# Patient Record
Sex: Female | Born: 1935 | Race: White | Hispanic: No | Smoking: Never smoker
Health system: Southern US, Community
[De-identification: ages and names within clinical notes are randomized; demographics above are authoritative.]

## PROBLEM LIST (undated history)

## (undated) DIAGNOSIS — E785 Hyperlipidemia, unspecified: Secondary | ICD-10-CM

## (undated) DIAGNOSIS — M545 Low back pain, unspecified: Secondary | ICD-10-CM

## (undated) DIAGNOSIS — K5790 Diverticulosis of intestine, part unspecified, without perforation or abscess without bleeding: Secondary | ICD-10-CM

## (undated) DIAGNOSIS — K859 Acute pancreatitis without necrosis or infection, unspecified: Secondary | ICD-10-CM

## (undated) DIAGNOSIS — D649 Anemia, unspecified: Secondary | ICD-10-CM

## (undated) DIAGNOSIS — F411 Generalized anxiety disorder: Secondary | ICD-10-CM

## (undated) DIAGNOSIS — I10 Essential (primary) hypertension: Secondary | ICD-10-CM

## (undated) DIAGNOSIS — K449 Diaphragmatic hernia without obstruction or gangrene: Secondary | ICD-10-CM

## (undated) DIAGNOSIS — K649 Unspecified hemorrhoids: Secondary | ICD-10-CM

## (undated) DIAGNOSIS — M1990 Unspecified osteoarthritis, unspecified site: Secondary | ICD-10-CM

## (undated) DIAGNOSIS — K2970 Gastritis, unspecified, without bleeding: Secondary | ICD-10-CM

## (undated) DIAGNOSIS — I350 Nonrheumatic aortic (valve) stenosis: Secondary | ICD-10-CM

## (undated) DIAGNOSIS — E039 Hypothyroidism, unspecified: Secondary | ICD-10-CM

## (undated) DIAGNOSIS — I739 Peripheral vascular disease, unspecified: Secondary | ICD-10-CM

## (undated) HISTORY — DX: Unspecified osteoarthritis, unspecified site: M19.90

## (undated) HISTORY — PX: HEMORRHOID SURGERY: SHX153

## (undated) HISTORY — PX: TUBAL LIGATION: SHX77

## (undated) HISTORY — DX: Low back pain, unspecified: M54.50

## (undated) HISTORY — DX: Diverticulosis of intestine, part unspecified, without perforation or abscess without bleeding: K57.90

## (undated) HISTORY — DX: Hypothyroidism, unspecified: E03.9

## (undated) HISTORY — DX: Nonrheumatic aortic (valve) stenosis: I35.0

## (undated) HISTORY — DX: Hyperlipidemia, unspecified: E78.5

## (undated) HISTORY — PX: APPENDECTOMY: SHX54

## (undated) HISTORY — DX: Anemia, unspecified: D64.9

## (undated) HISTORY — DX: Generalized anxiety disorder: F41.1

## (undated) HISTORY — DX: Gastritis, unspecified, without bleeding: K29.70

## (undated) HISTORY — PX: BACK SURGERY: SHX140

## (undated) HISTORY — PX: BREAST SURGERY: SHX581

## (undated) HISTORY — DX: Essential (primary) hypertension: I10

## (undated) HISTORY — DX: Unspecified hemorrhoids: K64.9

## (undated) HISTORY — DX: Acute pancreatitis without necrosis or infection, unspecified: K85.90

## (undated) HISTORY — DX: Diaphragmatic hernia without obstruction or gangrene: K44.9

## (undated) HISTORY — DX: Low back pain: M54.5

---

## 1993-03-31 HISTORY — PX: ANGIOPLASTY: SHX39

## 1999-06-05 ENCOUNTER — Encounter: Payer: Self-pay | Admitting: *Deleted

## 1999-06-05 ENCOUNTER — Ambulatory Visit (HOSPITAL_COMMUNITY): Admission: RE | Admit: 1999-06-05 | Discharge: 1999-06-05 | Payer: Self-pay | Admitting: *Deleted

## 1999-06-21 ENCOUNTER — Ambulatory Visit (HOSPITAL_COMMUNITY): Admission: RE | Admit: 1999-06-21 | Discharge: 1999-06-21 | Payer: Self-pay | Admitting: *Deleted

## 1999-06-21 ENCOUNTER — Encounter: Payer: Self-pay | Admitting: *Deleted

## 1999-07-05 ENCOUNTER — Ambulatory Visit (HOSPITAL_COMMUNITY): Admission: RE | Admit: 1999-07-05 | Discharge: 1999-07-05 | Payer: Self-pay | Admitting: *Deleted

## 1999-07-05 ENCOUNTER — Encounter: Payer: Self-pay | Admitting: *Deleted

## 1999-10-01 ENCOUNTER — Emergency Department (HOSPITAL_COMMUNITY): Admission: EM | Admit: 1999-10-01 | Discharge: 1999-10-01 | Payer: Self-pay | Admitting: Emergency Medicine

## 1999-10-28 ENCOUNTER — Encounter: Payer: Self-pay | Admitting: Neurosurgery

## 1999-10-28 ENCOUNTER — Ambulatory Visit (HOSPITAL_COMMUNITY): Admission: RE | Admit: 1999-10-28 | Discharge: 1999-10-28 | Payer: Self-pay | Admitting: Neurosurgery

## 2000-01-31 ENCOUNTER — Encounter: Payer: Self-pay | Admitting: Family Medicine

## 2000-01-31 ENCOUNTER — Encounter: Admission: RE | Admit: 2000-01-31 | Discharge: 2000-01-31 | Payer: Self-pay | Admitting: Family Medicine

## 2000-11-05 ENCOUNTER — Other Ambulatory Visit: Admission: RE | Admit: 2000-11-05 | Discharge: 2000-11-05 | Payer: Self-pay | Admitting: Family Medicine

## 2000-11-10 ENCOUNTER — Ambulatory Visit (HOSPITAL_COMMUNITY): Admission: RE | Admit: 2000-11-10 | Discharge: 2000-11-10 | Payer: Self-pay | Admitting: Family Medicine

## 2002-12-21 ENCOUNTER — Other Ambulatory Visit: Admission: RE | Admit: 2002-12-21 | Discharge: 2002-12-21 | Payer: Self-pay | Admitting: Family Medicine

## 2003-02-27 ENCOUNTER — Encounter: Admission: RE | Admit: 2003-02-27 | Discharge: 2003-02-27 | Payer: Self-pay | Admitting: Family Medicine

## 2003-04-01 LAB — CONVERTED CEMR LAB

## 2003-04-01 LAB — HM MAMMOGRAPHY

## 2004-01-01 ENCOUNTER — Encounter: Admission: RE | Admit: 2004-01-01 | Discharge: 2004-01-01 | Payer: Self-pay | Admitting: Family Medicine

## 2004-05-02 ENCOUNTER — Other Ambulatory Visit: Admission: RE | Admit: 2004-05-02 | Discharge: 2004-05-02 | Payer: Self-pay | Admitting: Family Medicine

## 2004-07-29 ENCOUNTER — Encounter: Admission: RE | Admit: 2004-07-29 | Discharge: 2004-07-29 | Payer: Self-pay | Admitting: Family Medicine

## 2005-09-23 ENCOUNTER — Encounter: Admission: RE | Admit: 2005-09-23 | Discharge: 2005-09-23 | Payer: Self-pay | Admitting: Family Medicine

## 2006-09-09 ENCOUNTER — Ambulatory Visit: Payer: Self-pay | Admitting: Internal Medicine

## 2006-09-29 ENCOUNTER — Ambulatory Visit: Payer: Self-pay | Admitting: Internal Medicine

## 2006-09-29 LAB — HM COLONOSCOPY: HM Colonoscopy: NORMAL

## 2007-08-14 DIAGNOSIS — I10 Essential (primary) hypertension: Secondary | ICD-10-CM | POA: Insufficient documentation

## 2007-08-14 DIAGNOSIS — M1990 Unspecified osteoarthritis, unspecified site: Secondary | ICD-10-CM | POA: Insufficient documentation

## 2007-08-14 DIAGNOSIS — M129 Arthropathy, unspecified: Secondary | ICD-10-CM

## 2007-08-14 DIAGNOSIS — E785 Hyperlipidemia, unspecified: Secondary | ICD-10-CM | POA: Insufficient documentation

## 2007-08-14 DIAGNOSIS — M5416 Radiculopathy, lumbar region: Secondary | ICD-10-CM | POA: Insufficient documentation

## 2007-08-14 DIAGNOSIS — F411 Generalized anxiety disorder: Secondary | ICD-10-CM | POA: Insufficient documentation

## 2007-08-14 DIAGNOSIS — E039 Hypothyroidism, unspecified: Secondary | ICD-10-CM | POA: Insufficient documentation

## 2007-12-08 ENCOUNTER — Telehealth: Payer: Self-pay | Admitting: Internal Medicine

## 2007-12-21 ENCOUNTER — Encounter: Admission: RE | Admit: 2007-12-21 | Discharge: 2007-12-21 | Payer: Self-pay | Admitting: Neurosurgery

## 2007-12-23 ENCOUNTER — Ambulatory Visit: Payer: Self-pay | Admitting: Cardiology

## 2007-12-23 ENCOUNTER — Emergency Department (HOSPITAL_COMMUNITY): Admission: EM | Admit: 2007-12-23 | Discharge: 2007-12-23 | Payer: Self-pay | Admitting: Emergency Medicine

## 2008-01-03 ENCOUNTER — Ambulatory Visit: Payer: Self-pay

## 2008-01-11 ENCOUNTER — Ambulatory Visit: Payer: Self-pay | Admitting: Cardiovascular Disease

## 2008-05-08 ENCOUNTER — Telehealth: Payer: Self-pay | Admitting: Internal Medicine

## 2008-06-13 ENCOUNTER — Ambulatory Visit: Payer: Self-pay | Admitting: Internal Medicine

## 2008-06-13 LAB — CONVERTED CEMR LAB
ALT: 17 units/L (ref 0–35)
AST: 19 units/L (ref 0–37)
Albumin: 4.2 g/dL (ref 3.5–5.2)
Alkaline Phosphatase: 63 units/L (ref 39–117)
Amylase: 76 units/L (ref 27–131)
Bilirubin, Direct: 0.1 mg/dL (ref 0.0–0.3)
Lipase: 20 units/L (ref 11.0–59.0)
Total Bilirubin: 1.1 mg/dL (ref 0.3–1.2)
Total Protein: 7 g/dL (ref 6.0–8.3)

## 2008-06-19 ENCOUNTER — Telehealth: Payer: Self-pay | Admitting: Internal Medicine

## 2008-06-22 ENCOUNTER — Ambulatory Visit (HOSPITAL_COMMUNITY): Admission: RE | Admit: 2008-06-22 | Discharge: 2008-06-22 | Payer: Self-pay | Admitting: Internal Medicine

## 2008-06-26 ENCOUNTER — Ambulatory Visit: Payer: Self-pay | Admitting: Internal Medicine

## 2008-06-26 ENCOUNTER — Encounter: Payer: Self-pay | Admitting: Internal Medicine

## 2008-06-27 ENCOUNTER — Encounter: Admission: RE | Admit: 2008-06-27 | Discharge: 2008-06-27 | Payer: Self-pay | Admitting: Orthopedic Surgery

## 2008-07-06 ENCOUNTER — Encounter: Payer: Self-pay | Admitting: Internal Medicine

## 2008-12-26 ENCOUNTER — Telehealth (INDEPENDENT_AMBULATORY_CARE_PROVIDER_SITE_OTHER): Payer: Self-pay | Admitting: *Deleted

## 2009-01-09 ENCOUNTER — Telehealth: Payer: Self-pay | Admitting: Internal Medicine

## 2009-03-31 LAB — HM DIABETES EYE EXAM: HM Diabetic Eye Exam: NORMAL

## 2009-06-11 ENCOUNTER — Encounter: Payer: Self-pay | Admitting: Internal Medicine

## 2010-02-18 ENCOUNTER — Ambulatory Visit: Payer: Self-pay | Admitting: Cardiovascular Disease

## 2010-02-18 ENCOUNTER — Encounter: Payer: Self-pay | Admitting: Cardiovascular Disease

## 2010-02-24 ENCOUNTER — Telehealth (INDEPENDENT_AMBULATORY_CARE_PROVIDER_SITE_OTHER): Payer: Self-pay | Admitting: *Deleted

## 2010-02-24 ENCOUNTER — Inpatient Hospital Stay (HOSPITAL_COMMUNITY): Admission: EM | Admit: 2010-02-24 | Discharge: 2010-02-26 | Payer: Self-pay | Admitting: Emergency Medicine

## 2010-02-24 ENCOUNTER — Ambulatory Visit: Payer: Self-pay | Admitting: Internal Medicine

## 2010-02-24 ENCOUNTER — Ambulatory Visit: Payer: Self-pay | Admitting: Cardiology

## 2010-02-24 ENCOUNTER — Encounter: Payer: Self-pay | Admitting: Internal Medicine

## 2010-02-24 LAB — CONVERTED CEMR LAB
HCT: 36.5 %
Hemoglobin: 12.3 g/dL
MCV: 98.4 fL
Platelets: 264 10*3/uL
RBC: 3.71 M/uL
RDW: 18.7 %
WBC: 14.8 10*3/uL

## 2010-02-25 ENCOUNTER — Encounter: Payer: Self-pay | Admitting: Internal Medicine

## 2010-02-25 ENCOUNTER — Encounter (INDEPENDENT_AMBULATORY_CARE_PROVIDER_SITE_OTHER): Payer: Self-pay | Admitting: Internal Medicine

## 2010-02-26 ENCOUNTER — Encounter: Payer: Self-pay | Admitting: Internal Medicine

## 2010-02-26 LAB — CONVERTED CEMR LAB
ALT: 21 units/L
AST: 26 units/L
Albumin: 3 g/dL
Alkaline Phosphatase: 49 units/L
BUN: 17 mg/dL
CO2: 25 meq/L
Calcium: 7.8 mg/dL
Chloride: 112 meq/L
Creatinine, Ser: 0.79 mg/dL
Glucose, Bld: 96 mg/dL
Potassium: 3.5 meq/L
Sodium: 140 meq/L
TSH: 3.509 microintl units/mL
Total Bilirubin: 0.5 mg/dL
Total Protein: 5.5 g/dL

## 2010-02-28 ENCOUNTER — Ambulatory Visit: Payer: Self-pay

## 2010-02-28 ENCOUNTER — Encounter: Payer: Self-pay | Admitting: Cardiovascular Disease

## 2010-03-02 ENCOUNTER — Telehealth: Payer: Self-pay | Admitting: Internal Medicine

## 2010-03-20 ENCOUNTER — Ambulatory Visit: Payer: Self-pay | Admitting: Cardiovascular Disease

## 2010-03-29 ENCOUNTER — Encounter: Payer: Self-pay | Admitting: Internal Medicine

## 2010-03-29 LAB — CONVERTED CEMR LAB
ALT: 19 units/L
AST: 20 units/L
Albumin: 4.3 g/dL
Alkaline Phosphatase: 100 units/L
BUN: 16 mg/dL
Basophils Relative: 1 %
CO2: 23 meq/L
Calcium: 9.5 mg/dL
Chloride: 105 meq/L
Cholesterol: 166 mg/dL
Creatinine, Ser: 0.8 mg/dL
Eosinophils Relative: 3 %
Glucose, Bld: 99 mg/dL
HCT: 33.6 %
HDL: 42 mg/dL
Hemoglobin: 11.1 g/dL
Hgb A1c MFr Bld: 6 %
LDL Cholesterol: 100 mg/dL
Lymphocytes, automated: 24 %
MCV: 97 fL
Monocytes Relative: 7 %
Neutrophils Relative %: 65 %
Platelets: 292 10*3/uL
Potassium: 4.1 meq/L
RBC: 3.47 M/uL
RDW: 19.5 %
Sodium: 141 meq/L
TSH: 2.19 microintl units/mL
Total Bilirubin: 0.4 mg/dL
Total Protein: 7.1 g/dL
Triglyceride fasting, serum: 120 mg/dL
WBC: 9.7 10*3/uL

## 2010-04-02 ENCOUNTER — Telehealth (INDEPENDENT_AMBULATORY_CARE_PROVIDER_SITE_OTHER): Payer: Self-pay | Admitting: *Deleted

## 2010-04-03 ENCOUNTER — Encounter: Payer: Self-pay | Admitting: *Deleted

## 2010-04-03 ENCOUNTER — Encounter: Payer: Self-pay | Admitting: Cardiology

## 2010-04-03 ENCOUNTER — Ambulatory Visit: Admission: RE | Admit: 2010-04-03 | Discharge: 2010-04-03 | Payer: Self-pay | Source: Home / Self Care

## 2010-04-03 ENCOUNTER — Encounter (HOSPITAL_COMMUNITY)
Admission: RE | Admit: 2010-04-03 | Discharge: 2010-04-30 | Payer: Self-pay | Source: Home / Self Care | Attending: Cardiovascular Disease | Admitting: Cardiovascular Disease

## 2010-04-21 ENCOUNTER — Encounter: Payer: Self-pay | Admitting: Family Medicine

## 2010-04-23 ENCOUNTER — Encounter: Payer: Self-pay | Admitting: Internal Medicine

## 2010-04-24 ENCOUNTER — Ambulatory Visit
Admission: RE | Admit: 2010-04-24 | Discharge: 2010-04-24 | Payer: Self-pay | Source: Home / Self Care | Attending: Internal Medicine | Admitting: Internal Medicine

## 2010-04-24 DIAGNOSIS — R7303 Prediabetes: Secondary | ICD-10-CM | POA: Insufficient documentation

## 2010-04-24 DIAGNOSIS — D649 Anemia, unspecified: Secondary | ICD-10-CM | POA: Insufficient documentation

## 2010-04-26 ENCOUNTER — Telehealth: Payer: Self-pay | Admitting: Internal Medicine

## 2010-04-30 NOTE — Procedures (Signed)
Summary: Gastroenterology  colon  Gastroenterology  colon   Imported By: Donneta Romberg Endo Tech 08/19/2007 14:18:14  _____________________________________________________________________  External Attachment:    Type:   Image     Comment:   External Document

## 2010-04-30 NOTE — Miscellaneous (Signed)
Summary: Rx in Recovery Room  Clinical Lists Changes  Medications: Added new medication of LEVSIN 0.125 MG  TABS (HYOSCYAMINE SULFATE) 1 SL q 4-6 hrs prn - Signed Rx of LEVSIN 0.125 MG  TABS (HYOSCYAMINE SULFATE) 1 SL q 4-6 hrs prn;  #30 x 1;  Signed;  Entered by: Doristine Church RN II;  Authorized by: Hart Carwin;  Method used: Electronically to [facility] [address] [place], [place], [place]  [address], Ph: [phone], Fax: [phone] Observations: Added new observation of ALLERGY REV: Done (06/26/2008 14:16) Added new observation of NKA: T (06/26/2008 14:16)    Prescriptions: LEVSIN 0.125 MG  TABS (HYOSCYAMINE SULFATE) 1 SL q 4-6 hrs prn  #30 x 1   Entered by:   Doristine Church RN II   Authorized by:   Hart Carwin   Signed by:   Doristine Church RN II on 06/26/2008   Method used:   Electronically to        [facility] [address] (retail)       [address]       [place], [place]  [address]       Ph: [phone]       Fax: [phone]   RxID:   [id number]

## 2010-04-30 NOTE — Progress Notes (Signed)
Summary: switch   Phone Note Call from Patient Call back at [phone]   Caller: Patient Call For: gessner Reason for Call: Talk to Nurse Summary of Call: pt wan to switch from dr Leone Payor to dr Juanda Chance b/c she want a second opinion Initial call taken by: Tawni Levy,  December 08, 2007 9:28 AM  Follow-up for Phone Call        Dr Leone Payor do you approve of change? Follow-up by: Darcey Nora RN,  December 08, 2007 10:12 AM  Additional Follow-up for Phone Call Additional follow up Details #1::        Fine with me, not sure (from chart review, what she needs second opinion for) If she would like to see me 1 more time to discuss (does not look like we have done so) I can do that, considering the size of Dr. Regino Schultze prcatice. Additional Follow-up by: Iva Boop MD,  December 08, 2007 11:46 AM    Additional Follow-up for Phone Call Additional follow up Details #2::    Spoke with patient, she has no problems with Dr Leone Payor, her son is a patient of Dr Juanda Chance and likes her very much, but is happy to come and discuss bloating with Dr Leone Payor.  Scheduled for 12-22-07 2:15 Follow-up by: Darcey Nora RN,  December 08, 2007 1:44 PM

## 2010-04-30 NOTE — Progress Notes (Signed)
Summary: TRIAGE  Medications Added FAMOTIDINE 40 MG TABS (FAMOTIDINE) Take one by mouth every morning       Phone Note Call from Patient Call back at Home Phone [phone]   Caller: Patient Call For: Juanda Chance Reason for Call: Talk to Nurse Summary of Call: Patient wants to let Dr know that the aciphex samples given to her are causing her to have really painful diarrhea Initial call taken by: Tawni Levy,  June 19, 2008 10:04 AM  Follow-up for Phone Call        Pt. states she took Achipex for 3 days and it gave her terrible diarrhea. Pt. had loose to watery stools, was having 5-6 stools daily. She stopped Aciphex on 06-17-08. Her BM's are firming up and slowing down. DR.BRODIE PLEASE ADVISE  Follow-up by: Laureen Ochs LPN,  June 19, 2008 1:39 PM  Additional Follow-up for Phone Call Additional follow up Details #1::        please try Pepcid 40 mg, 1 by mouth once daily, # 30, 1 refill Additional Follow-up by: Hart Carwin,  June 19, 2008 5:18 PM    Additional Follow-up for Phone Call Additional follow up Details #2::    Pt. will try Pepcid. Pt. instructed to call back as needed.  Follow-up by: Laureen Ochs LPN,  June 20, 2008 8:26 AM  New/Updated Medications: FAMOTIDINE 40 MG TABS (FAMOTIDINE) Take one by mouth every morning   Prescriptions: FAMOTIDINE 40 MG TABS (FAMOTIDINE) Take one by mouth every morning  #30 x 3   Entered by:   Laureen Ochs LPN   Authorized by:   Hart Carwin   Signed by:   Laureen Ochs LPN on 91/47/8295   Method used:   Electronically to        [facility] [address] (retail)       [address]       [place], [place]  [address]       Ph: [phone]       Fax: [phone]   RxID:   [id number]

## 2010-04-30 NOTE — Progress Notes (Signed)
Summary: Records faxed  Last cardiology office note faxed to [name] at [phone] [facility].Call back number at [phone]. [name]  December 26, 2008 2:05 PM

## 2010-04-30 NOTE — Progress Notes (Signed)
Summary: constipated  Phone Note Call from Patient   Caller: Patient Summary of Call: has not defecated in 10 days bloated, mildrightabd pain without vomiting  1) Take 4 Dulcolax or Senokot tablets with a large glass of water. One (1) hour later drink Miralax 1 dose (1 packet or 1 tablespoon in 8 oz clear lquid) 6-8 times in 2-3 hours.  2) Call back ifthathas not helped 3) regular MiraLaxafterthat Initial call taken by: Iva Boop MD, [facility],  March 02, 2010 10:22 AM

## 2010-04-30 NOTE — Progress Notes (Signed)
Summary: Change Dr   Phone Note Call from Patient Call back at [phone]   Caller: Patient Call For: Dr. Leone Payor Details for Reason: Change Dr. Summary of Call: Patient would like to switch from Dr. Leone Payor to Dr. Juanda Chance. Patient wants a female MD. Patients son sees DB. Initial call taken by: Zackery Barefoot,  May 08, 2008 2:30 PM  Follow-up for Phone Call        [facility] if ok with Dr. Juanda Chance Follow-up by: Iva Boop MD,  May 10, 2008 6:38 AM  Additional Follow-up for Phone Call Additional follow up Details #1::        OK Additional Follow-up by: Hart Carwin MD,  May 10, 2008 8:59 PM

## 2010-04-30 NOTE — Assessment & Plan Note (Signed)
Summary: TROUBLE WITH GALLBLADDER/FH    History of Present Illness Visit Type:  Follow-up Visit Primary GI MD:  [name] MD Primary MD:  Ursula Beath, MD Chief Complaint:  RUQ abd pain that radiates to epigastric area History of Present Illness:  This is an 77 year old white female former patient of Dr. Leone Payor who asked to see me because I take care of her son. Patient has had an excellent workup by Dr. Leone Payor with findings of severe diverticulosis of the left colon found on colonoscopy in August 2008. She is now complaining of right upper quadrant abdominal discomfort which is also characterized by a burning sensation extending to the right middle quadrant when she is sitting but staying in the epigastrium when she is standing. She has taken prune juice and apricots as a laxative to  soften her  bowel movements. She has a strong family history of gallbladder disease in both her son and daughter. She, herself, denies any food intolerance or dyspepsia. Her weight has increased about 6 pounds. An upper abdominal ultrasound in January 2009 showed upper limits of a normal common bile duct at 6.8 mm but a normal-appearing gallbladder. She is known to have a ventral hernia due to separation of the rectus muscle.   GI Review of Systems      Reports abdominal pain.     Location of  Abdominal pain: RUQ.     Denies acid reflux, belching, bloating, chest pain, dysphagia with liquids, dysphagia with solids, heartburn, loss of appetite, nausea, vomiting, vomiting blood, weight loss, and  weight gain.        Denies anal fissure, black tarry stools, change in bowel habit, constipation, diarrhea, diverticulosis, fecal incontinence, heme positive stool, hemorrhoids, irritable bowel syndrome, jaundice, light color stool, liver problems, rectal bleeding, and  rectal pain. Preventive Screening-Counseling & Management     Smoking Status: never    Current Medications (verified): 1)  Lotrel 10-20 Mg  Caps (Amlodipine Besy-Benazepril Hcl) .... One Tablet By Mouth Once Daily 2)  Ambien 10 Mg Tabs (Zolpidem Tartrate) .... One Tablet By Mouth At Bedtime 3)  Naprosyn 375 Mg Tabs (Naproxen) .... One Tablet By Mouth Two Times A Day 4)  Soma 350 Mg Tabs (Carisoprodol) .... One Tablet By Mouth At Bedtime 5)  Fish Oil 1000 Mg Caps (Omega-3 Fatty Acids) .... One Capsule By Mouth Once Daily 6)  Vitamin D 56387 Unit Caps (Ergocalciferol) .... One Tablet By Mouth Every Sunday  Allergies (verified): No Known Drug Allergies  Past History:  Past Medical History:    Reviewed history from 08/14/2007 and no changes required:    Current Problems:     DIVERTICULOSIS, COLON (ICD-562.10)    HEMORRHOIDS, HX OF (ICD-V12.79)    LOW BACK PAIN, CHRONIC (ICD-724.2)    ANXIETY (ICD-300.00)    ARTHRITIS (ICD-716.90)    HYPOTHYROIDISM (ICD-244.9)    HYPERLIPIDEMIA (ICD-272.4)    HYPERTENSION (ICD-401.9)  Past Surgical History:    Reviewed history from 08/14/2007 and no changes required:    back surgery x 2    hemorrhoidectomy    angioplasty 1995    tubal ligation    breast surgery-benign    appendectomy  Family History:    No FH of Colon Cancer:    Lung cancer: father  Social History:    Married    Alcohol Use - no    Illicit Drug Use - no    Patient has never smoked.     Daily Caffeine Use    Smoking  Status:  never  Review of Systems  The patient denies allergy/sinus, anemia, anxiety-new, arthritis/joint pain, back pain, blood in urine, breast changes/lumps, change in vision, confusion, cough, coughing up blood, depression-new, fainting, fatigue, fever, headaches-new, hearing problems, heart murmur, heart rhythm changes, itching, menstrual pain, muscle pains/cramps, night sweats, nosebleeds, pregnancy symptoms, shortness of breath, skin rash, sleeping problems, sore throat, swelling of feet/legs, swollen lymph glands, thirst - excessive , urination - excessive , urination changes/pain, urine  leakage, vision changes, and voice change.    Vital Signs:  Patient profile:   77 year old female Height:      68 inches Weight:      177.50 pounds BMI:     27.09 BSA:     1.94 Pulse rate:   74 / minute Pulse rhythm:   regular BP sitting:   142 / 66  (left arm) Cuff size:   regular  Vitals Entered By: Christie Nottingham CMA (June 13, 2008 10:10 AM)  Physical Exam  General:  very pleasant, alert and oriented. Neck:  Supple; no masses or thyromegaly. Chest Wall:  mild tenderness of the right costal margin, especially medially. Lungs:  Clear throughout to auscultation. Heart:  Regular rate and rhythm; no murmurs, rubs or bruits. Abdomen:  abdomen is soft with tenderness in epigastrium and in the midline. I could not reproduce any pain in the right upper quadrant on inspiration or expiration. Her liver edge is at the costal margin. There is a large rectus muscle separation when she tries to sit up. Lower abdomen is unremarkable. Rectal:  External hemorrhoidal tags are present. Normal rectal tone. Stool is hemoccult negative. Extremities:  No clubbing, cyanosis, edema or deformities noted. Neurologic:  Alert and  oriented x4;  grossly normal neurologically.   Impression & Recommendations:  Problem # 1:  ABDOMINAL PAIN, RIGHT UPPER QUADRANT (ICD-789.01) epigastric and right upper quadrant discomfort which is partly  originating in the abdominal wall and may be  exacerbated by her ventral hernia. She also has   a strong family history of gallbladder disease and borderline prominent common bile duct on recent ultrasound. For that reason we will go with a HIDA scan with CCK. Because of her epigastric tenderness and the fact that she is on Naprosyn bid we need to rule  NSAID gastropathy. We will  proceed with upper endoscopy . I have started her on AcipHex 20 mg twice a day Orders: TLB-Hepatic/Liver Function Pnl (80076-HEPATIC) TLB-Amylase (82150-AMYL) TLB-Lipase (83690-LIPASE) EGD  (EGD) HIDA CCK (HIDA CCK)  Problem # 2:  DIVERTICULOSIS, COLON (ICD-562.10) severe diverticulosis as per patient's last colonoscopy done by Dr. Leone Payor in July 2008. She is on a high fiber diet and she is hemoccult-negative.  Patient Instructions: 1)  Avoid bending over or any physical activity that seems to aggravate her rib cage  tenderness 2)  Upper endoscopy 3)  AcipHex 20 mg p.o. q.d. x 2 weeks- samples given 4)  HIDA scan with CCK 5)  Liver function tests, amylase, lipase 6)  Copy sent to : Dr Arman Bogus

## 2010-04-30 NOTE — Procedures (Signed)
Summary: EGD   EGD  Procedure date:  06/26/2008  Findings:      Location: [facility]    ENDOSCOPY PROCEDURE REPORT  PATIENT:  Brittney Gates, Point  MR#:  [id number] BIRTHDATE:   1924/04/29, 77 yrs. old   GENDER:   female  ENDOSCOPIST:   Hedwig Morton. Juanda Chance, MD Referred by: Ursula Beath, M.D.  PROCEDURE DATE:  06/26/2008 PROCEDURE:  EGD with biopsy ASA CLASS:   Class II INDICATIONS: abdominal pain epig., RUQ discomfort, ventral hernia, Normal HIDA with CCK, strong F hx of gall bladder disease   MEDICATIONS:    Versed 5 mg, Fentanyl 50 mcg TOPICAL ANESTHETIC:   Exactacain Spray  DESCRIPTION OF PROCEDURE:   After the risks benefits and alternatives of the procedure were thoroughly explained, informed consent was obtained.  The LB GIF-H180 [id number] endoscope was introduced through the mouth and advanced to the second portion of the duodenum, without limitations.  The instrument was slowly withdrawn as the mucosa was fully examined. <<PROCEDUREIMAGES>>              <<OLD IMAGES>>  Mild gastritis was found in the body and the antrum of the stomach. diffuse eruthema, small amount of bile reflux With standard forceps, a biopsy was obtained and sent to pathology (see image2, image3, image5, and image6).  A hiatal hernia was found. Bx's of a tongue of gastric mucosa, small 1-2 cm H Hernia, partly reducible With standard forceps, a biopsy was obtained and sent to pathology (see image8, image7, image6, and image1).  Otherwise the examination was normal (see image4).    Retroflexed views revealed no abnormalities.    The scope was then withdrawn from the patient and the procedure completed.  COMPLICATIONS:   None  ENDOSCOPIC IMPRESSION:  1) Mild gastritis in the body and the antrum of the stomach  2) Hiatal hernia  3) Otherwise normal examination  s/p biopsies gastric antrum and g-e junction RECOMMENDATIONS:  Aciphex 20 mg po bid  minimizr Naprosyn  severe diverticulosis may  be resposible for some of pt's abdominal symptoms  try Levsin SL.125 mg prn  REPEAT EXAM:   In 0 year(s) for.   _______________________________ Hedwig Morton. Juanda Chance, MD    CC:      REPORT OF SURGICAL PATHOLOGY   Case #: [id number] Patient Name: Brittney Gates, Brittney Gates. Office Chart Number:  [id number]   MRN: [id number] Pathologist: Alden Server A. Delila Spence, MD DOB/Age  08/16/1924 (Age: 77)    Gender: F Date Taken:  06/26/2008 Date Received: 06/27/2008   FINAL DIAGNOSIS   ***MICROSCOPIC EXAMINATION AND DIAGNOSIS***   1.  STOMACH:  REACTIVE GASTROPATHY.  SEE COMMENT.   2.  ESOPHAGOGASTRIC JUNCTION MUCOSA WITH MILD INFLAMMATION.  NO INTESTINAL METAPLASIA, DYSPLASIA OR MALIGNANCY IDENTIFIED (BIOPSY)   COMMENT 1.  This pattern can be associated with nonsteroidal anti-inflammatory drugs (NSAIDS), alcohol or with other causes of chemical gastropathy.  Helicobacter pylori are not identified with Warthin-Starry stain. The control stained appropriately.   2.  An Alcian Blue stain is performed to determine the presence of intestinal metaplasia (goblet cell metaplasia). No intestinal metaplasia (goblet cell metaplasia) is identified with the Alcian Blue stain. The control stained appropriately. (EAA:mj 06/28/08)   gdt Date Reported:  06/28/2008     Alden Server A. Delila Spence, MD *** Electronically Signed Out By EAA ***  July 06, 2008 MRN: [id number]    [facility] Rowen [address] [place], [place]  [address]    Dear Ms. Baines,  I am pleased to inform you that  the biopsies taken during your recent endoscopic examination did not show any evidence of cancer upon pathologic examination.The biopsies from Your stomach showed mild gastritis  Additional information/recommendations:  __No further action is needed at this time.  Please follow-up with      your primary care physician for your other healthcare needs.  __ Please call [phone] to schedule a return visit to review      your condition.  _x_  Continue with the treatment plan as outlined on the day of your      exam.  __ You should have a repeat endoscopic examination for this problem              in _ months/years.   Please call us if you are having persistent problems or have questions about your condition that have not been fully answered at this time.  Sincerely,  Hart Carwin  This letter has been electronically signed by your physician.  This report was created from the original endoscopy report, which was reviewed and signed by the above listed endoscopist.

## 2010-04-30 NOTE — Progress Notes (Signed)
Summary: Cardiology - Gastritis and syncope  Phone Note Call from Patient Call back at [phone]   Caller: Reginia Naas Reason for Call: Talk to Doctor Summary of Call: Returned call from pt's son, [name] concerning pt being admitted for food poisoning, syncope and now bradycardia.  Son would like [facility] to know his mother is in the hospital and would like him to evaluate the patient.  I have explained to the son that we can not offically see the pt until we are consulted by the admitting physician.  He voiced understanding and says he will request the pt's physician consult our group.  He was very grateful for the call. Initial call taken by: Robbi Garter NP-PA,  February 24, 2010 8:55 PM

## 2010-04-30 NOTE — Assessment & Plan Note (Signed)
Summary: f1y  Medications Added MULTIVITAMINS  TABS (MULTIPLE VITAMIN) about 3x a week ASPIRIN 81 MG TBEC (ASPIRIN) Take one tablet by mouth daily ZETIA 10 MG TABS (EZETIMIBE) Take one tablet by mouth daily. OXYCONTIN 10 MG XR12H-TAB (OXYCODONE HCL) as needed SYNTHROID 112 MCG TABS (LEVOTHYROXINE SODIUM) Take 1 tablet by mouth once a day      Allergies Added: NKDA  Visit Type:  Follow-up Primary Provider:  Ursula Beath, MD  CC:  Chest heaviness.  History of Present Illness: 77 year-old woman presenting for followup evaluation of chest pain. She was last seen in 2009 for chest pain and underwent a Myoview stress test showing no significant ischemia. She has had a cardiac cath about 12 years ago which was reportedly normal.  Over the past 6 months the patient has developed episodic chest pain described as a 'heavy feeling' across the chest into the shoulders. These episodes begin abruptly, and she can't identify a trigger. She denies exertional chest pain or tightness. She feels weak and lightheaded when episodes occur. Denies dyspnea or other complaints. Episodes are variable but last from 5-45 minutes.    Current Medications (verified): 1)  Lotrel 10-20 Mg Caps (Amlodipine Besy-Benazepril Hcl) .... One Tablet By Mouth Once Daily 2)  Ambien 10 Mg Tabs (Zolpidem Tartrate) .... One Tablet By Mouth At Bedtime 3)  Multivitamins  Tabs (Multiple Vitamin) .... About 3x A Week 4)  Aspirin 81 Mg Tbec (Aspirin) .... Take One Tablet By Mouth Daily 5)  Zetia 10 Mg Tabs (Ezetimibe) .... Take One Tablet By Mouth Daily. 6)  Oxycontin 10 Mg Xr12h-Tab (Oxycodone Hcl) .... As Needed 7)  Synthroid 112 Mcg Tabs (Levothyroxine Sodium) .... Take 1 Tablet By Mouth Once A Day  Allergies (verified): No Known Drug Allergies  Past History:  Past medical history reviewed for relevance to current acute and chronic problems.  Past Medical History: Reviewed history from 02/13/2010 and no changes  required.  Hypertension Hyperlipidemia Anxiety Hypothyroidism Diverticulosis Hemorrhoids Low back pain Arthritis     Review of Systems       Negative except as per HPI   Vital Signs:  Patient profile:   77 year old female Height:      68 inches Weight:      178.50 pounds BMI:     27.24 Pulse rate:   70 / minute Pulse rhythm:   regular Resp:     18 per minute BP sitting:   148 / 67  (left arm) Cuff size:   large  Vitals Entered By: Vikki Ports (February 18, 2010 1:50 PM)  Physical Exam  General:  Pt is alert and oriented, elderly woman in no acute distress. HEENT: normal Neck: normal carotid upstrokes without bruits, JVP normal Lungs: CTA CV: RRR without murmur or gallop Abd: soft, NT, positive BS, no bruit, no organomegaly Ext: no clubbing, cyanosis, or edema. peripheral pulses 2+ and equal Skin: warm and dry without rash    EKG  Procedure date:  02/18/2010  Findings:      NSR 70 bpm, variable rate, no significant ST abnormality  Impression & Recommendations:  Problem # 1:  CHEST PAIN, PRECORDIAL (ICD-786.51) The patient's symptoms are most suggestive of an arrhythmia. She describes sudden onset of chest heaviness and this is unpredictable. Stress test 2 years ago was normal and I was concerned about atrial fib at that point, but we have not been able to document this. Recommend an event recorder to evaluate for arrhythmia. She has normal LV  function. If the event recorder is unrevealing, may have to consider an evaluation for ischemia but I think this is less likely.  Her updated medication list for this problem includes:    Lotrel 10-20 Mg Caps (Amlodipine besy-benazepril hcl) ..... One tablet by mouth once daily    Aspirin 81 Mg Tbec (Aspirin) .Marland Kitchen... Take one tablet by mouth daily  Orders: EKG w/ Interpretation (93000) Event (Event)  Problem # 2:  HYPERTENSION (ICD-401.9) Reports BP averages 120's/50's at home.  Her updated medication list for  this problem includes:    Lotrel 10-20 Mg Caps (Amlodipine besy-benazepril hcl) ..... One tablet by mouth once daily    Aspirin 81 Mg Tbec (Aspirin) .Marland Kitchen... Take one tablet by mouth daily  Orders: EKG w/ Interpretation (93000) Event (Event)  BP today: 148/67 Prior BP: 142/66 (06/13/2008)  Patient Instructions: 1)  Your physician recommends that you schedule a follow-up appointment in: 6 WEEKS 2)  Your physician recommends that you continue on your current medications as directed. Please refer to the Current Medication list given to you today. 3)  Your physician has recommended that you wear an event monitor.  Event monitors are medical devices that record the heart's electrical activity. Doctors most often use these monitors to diagnose arrhythmias. Arrhythmias are problems with the speed or rhythm of the heartbeat. The monitor is a small, portable device. You can wear one while you do your normal daily activities. This is usually used to diagnose what is causing palpitations/syncope (passing out).

## 2010-04-30 NOTE — Progress Notes (Signed)
Summary: TRIAGE   Phone Note Call from Patient Call back at Home Phone [phone]   Caller: Patient Call For: Juanda Chance Reason for Call: Talk to Nurse Summary of Call: Patient wants to speak directly to the nurse Initial call taken by: Tawni Levy,  January 09, 2009 2:46 PM  Follow-up for Phone Call        Pt. c/o ongoing, intermittent epigastric and RUQ pain. Has "spasms' when she laughs or bends over. Denies n/v, fever, constipation, diarrhea, blood, black stools. Needs refill for Levsin.  1) Appt. with Dr.Brodie on 01-19-09 at 2:30pm 2) Levsin 0.125mg  SL Q 4-6 hours as needed 3) Soft,bland diet. No spicy,greasy,fried foods. 4) If symptoms become worse call back immediately. Follow-up by: Laureen Ochs LPN,  January 09, 2009 4:39 PM    Prescriptions: LEVSIN 0.125 MG  TABS (HYOSCYAMINE SULFATE) 1 SL q 4-6 hrs prn  #30 x 1   Entered by:   Laureen Ochs LPN   Authorized by:   Hart Carwin MD   Signed by:   Laureen Ochs LPN on 34/74/2595   Method used:   Electronically to        [facility] [address] (retail)       [address]       [place], [place]  [address]       Ph: [phone]       Fax: [phone]   RxID:   [id number]

## 2010-04-30 NOTE — Letter (Signed)
Summary: Patient [facility] Biopsy Results  [facility]  [address] [place], [place] [address]   Phone: [phone]  Fax: [phone]        July 06, 2008 MRN: [id number]    Brittney Gates [address] [place], [place]  [address]    Dear [name],  I am pleased to inform you that the biopsies taken during your recent endoscopic examination did not show any evidence of cancer upon pathologic examination.The biopsies from Your stomach showed mild gastritis  Additional information/recommendations:  __No further action is needed at this time.  Please follow-up with      your primary care physician for your other healthcare needs.  __ Please call [phone] to schedule a return visit to review      your condition.  _x_ Continue with the treatment plan as outlined on the day of your      exam.  __ You should have a repeat endoscopic examination for this problem              in _ months/years.   Please call us if you are having persistent problems or have questions about your condition that have not been fully answered at this time.  Sincerely,  Hart Carwin  This letter has been electronically signed by your physician.

## 2010-04-30 NOTE — Consult Note (Signed)
Summary: Brittney Gates  [place]   Imported By: Sherian Rein 02/28/2010 07:47:33  _____________________________________________________________________  External Attachment:    Type:   Image     Comment:   External Document

## 2010-05-02 ENCOUNTER — Encounter: Payer: Self-pay | Admitting: Internal Medicine

## 2010-05-02 NOTE — Procedures (Signed)
Summary: Summary Report  Summary Report   Imported By: Erle Crocker 04/18/2010 14:30:11  _____________________________________________________________________  External Attachment:    Type:   Image     Comment:   External Document

## 2010-05-02 NOTE — Assessment & Plan Note (Signed)
Summary: Cardiology Nuclear Testing  Nuclear Med Background Indications for Stress Test: Evaluation for Ischemia, Post Hospital  Indications Comments: 02/27/10 [facility]: Syncope, nausea, vomiting  History: Echo, Heart Catheterization, Myocardial Perfusion Study  History Comments: '99 Cath:normal; '09 KGM:WNUUVO, EF=59%; 11/11 Echo:normal  Symptoms: Chest Pressure, Chest Pressure with Exertion, Diaphoresis, DOE, Fatigue, Nausea, Syncope, Vomiting    Nuclear Pre-Procedure Cardiac Risk Factors: Hypertension, Lipids Caffeine/Decaff Intake: None NPO After: 6:30 PM Lungs: Clear.  O2 Sat 94% on RA. IV 0.9% NS with Angio Cath: 22g     IV Site: R Antecubital IV Started by: Bonnita Levan, RN Chest Size (in) 42     Cup Size D     Height (in): 68 Weight (lb): 174 BMI: 26.55  Nuclear Med Study 1 or 2 day study:  1 day     Stress Test Type:  Eugenie Birks Reading MD:  Cassell Clement, MD     Referring MD:  Tonny Bollman, MD Resting Radionuclide:  Technetium 61m Tetrofosmin     Resting Radionuclide Dose:  10.9 mCi  Stress Radionuclide:  Technetium 25m Tetrofosmin     Stress Radionuclide Dose:  33 mCi   Stress Protocol      Max HR:  103 bpm     Predicted Max HR:  137 bpm       Percent Max HR:  75.18 % Lexiscan: 0.4 mg   Stress Test Technologist:  Rea College, CMA-N     Nuclear Technologist:  Doyne Keel, CNMT  Rest Procedure  Myocardial perfusion imaging was performed at rest 45 minutes following the intravenous administration of Technetium 41m Tetrofosmin.  Stress Procedure  The patient received IV Lexiscan 0.4 mg over 15-seconds.  Technetium 38m Tetrofosmin injected at 30-seconds.  There were no significant changes with infusion, other than occasional PVC's and rare PAC.  Quantitative spect images were obtained after a 45 minute delay.  QPS Raw Data Images:  Normal; no motion artifact; normal heart/lung ratio. Stress Images:  Normal homogeneous uptake in all areas of the myocardium. Rest  Images:  Normal homogeneous uptake in all areas of the myocardium. Subtraction (SDS):  No evidence of ischemia. Transient Ischemic Dilatation:  1.14  (Normal <1.22)  Lung/Heart Ratio:  0.41  (Normal <0.45)  Quantitative Gated Spect Images QGS EDV:  92 ml QGS ESV:  40 ml QGS EF:  57 % QGS cine images:  No wall motion abnormalities.  Findings Normal nuclear study      Overall Impression  Exercise Capacity: Lexiscan with no exercise. BP Response: Normal blood pressure response. Clinical Symptoms: No chest pain ECG Impression: No significant ST segment change suggestive of ischemia. Overall Impression: Normal stress nuclear study.  Appended Document: Cardiology Nuclear Testing Pt aware of results by phone.

## 2010-05-02 NOTE — Consult Note (Signed)
Summary: [facility] Consultation Report  [facility] Consultation Report   Imported By: Earl Many 03/22/2010 15:56:17  _____________________________________________________________________  External Attachment:    Type:   Image     Comment:   External Document

## 2010-05-02 NOTE — Progress Notes (Signed)
Summary: Nuclear Pre-Procedure  Phone Note Outgoing Call Call back at [facility] Phone [phone]   Call placed by: Stanton Kidney, EMT-P,  April 02, 2010 1:42 PM Call placed to: Patient Action Taken: Phone Call Completed Summary of Call: Reviewed information on Myoview Information Sheet (see scanned document for further details).  Spoke with the patient. Stanton Kidney, EMT-P  April 02, 2010 1:43 PM     Nuclear Med Background Indications for Stress Test: Evaluation for Ischemia, Post Hospital  Indications Comments: 02/27/10 [facility]: Syncope, nausea, vomiting  History: Echo, Heart Catheterization, Myocardial Perfusion Study  History Comments: '99 Heart Cath: NL 10/09 MPS: NL, EF= 59% 11/11 Echo: NL  Symptoms: Nausea, Syncope, Vomiting    Nuclear Pre-Procedure Cardiac Risk Factors: Hypertension, Lipids Height (in): 68

## 2010-05-02 NOTE — Assessment & Plan Note (Signed)
Summary: eph   Visit Type:  Follow-up Primary Tkeya Stencil:  Ursula Beath, MD  CC:  chest pain.  History of Present Illness: 77 year-old woman presenting for hospital followup after presenting with syncope in the setting of a GI illness. She was noted to have pauses during vomiting, but this was presumed to be a vagal event and observation was recommended.   Her main complaint at present is chest burning and pressure with exertion. She states pain radiates to both shoulders. She has rare palps but no further syncope. An event recorder was completed and there were no significant brady or tachy events.  Current Medications (verified): 1)  Ambien 10 Mg Tabs (Zolpidem Tartrate) .... One Tablet By Mouth At Bedtime 2)  Multivitamins  Tabs (Multiple Vitamin) .... About 3x A Week 3)  Aspirin 81 Mg Tbec (Aspirin) .... Take One Tablet By Mouth Daily 4)  Zetia 10 Mg Tabs (Ezetimibe) .... Take One Tablet By Mouth Daily. 5)  Synthroid 112 Mcg Tabs (Levothyroxine Sodium) .... Take 1 Tablet By Mouth Once A Day 6)  Naproxen Sodium 550 Mg Tabs (Naproxen Sodium) .... As Needed  Allergies (verified): No Known Drug Allergies  Past History:  Past medical history reviewed for relevance to current acute and chronic problems.  Past Medical History: Reviewed history from 02/13/2010 and no changes required.  Hypertension Hyperlipidemia Anxiety Hypothyroidism Diverticulosis Hemorrhoids Low back pain Arthritis     Review of Systems       Negative except as per HPI   Vital Signs:  Patient profile:   77 year old female Height:      68 inches Weight:      175 pounds BMI:     26.70 Pulse rate:   89 / minute Resp:     16 per minute BP sitting:   189 / 90  (left arm)  Vitals Entered By: Marrion Coy, CNA (March 20, 2010 3:30 PM)  Physical Exam  General:  Pt is alert and oriented, elderly woman in no acute distress. HEENT: normal Neck: normal carotid upstrokes without bruits, JVP  normal Lungs: CTA CV: RRR without murmur or gallop Abd: soft, NT, positive BS, no bruit, no organomegaly Ext: no clubbing, cyanosis, or edema. peripheral pulses 2+ and equal Skin: warm and dry without rash    Impression & Recommendations:  Problem # 1:  CHEST PAIN, PRECORDIAL (ICD-786.51) Symptoms typical of angina, only occuring with exertion. She has reported fleeting chest pians in the past, but of a different character than her current symptoms. Negative stress testing in past, but this was greater than 2 years ago. Recommend repeat Myoview scan to rule out significant ischemia. Plan cath if Myoview abnormal. Otherwise continue current medical therapy. Note elevated BP today but she checks regularly at home and states that it runs in the low-normal range. Will not change meds today.  The following medications were removed from the medication list:    Lotrel 10-20 Mg Caps (Amlodipine besy-benazepril hcl) ..... One tablet by mouth once daily Her updated medication list for this problem includes:    Aspirin 81 Mg Tbec (Aspirin) .Marland Kitchen... Take one tablet by mouth daily  Orders: Nuclear Stress Test (Nuc Stress Test)  Problem # 2:  HYPERTENSION (ICD-401.9) [place] HTN. Reports good control at home. Cont currnet Rx.  The following medications were removed from the medication list:    Lotrel 10-20 Mg Caps (Amlodipine besy-benazepril hcl) ..... One tablet by mouth once daily Her updated medication list for this problem includes:  Aspirin 81 Mg Tbec (Aspirin) .Marland Kitchen... Take one tablet by mouth daily  Orders: Nuclear Stress Test (Nuc Stress Test)  BP today: 189/90 Prior BP: 148/67 (02/18/2010)  Problem # 3:  HYPERLIPIDEMIA (ICD-272.4) Assessment: Comment Only  Her updated medication list for this problem includes:    Zetia 10 Mg Tabs (Ezetimibe) .Marland Kitchen... Take one tablet by mouth daily.  Orders: Nuclear Stress Test (Nuc Stress Test)  Patient Instructions: 1)  Your physician recommends  that you continue on your current medications as directed. Please refer to the Current Medication list given to you today. 2)  Your physician wants you to follow-up in: 6 MONTHS.  You will receive a reminder letter in the mail two months in advance. If you don't receive a letter, please call our office to schedule the follow-up appointment. 3)  Your physician has requested that you have a Lexiscan myoview.  For further information please visit [email or web address]  Please follow instruction sheet, as given.

## 2010-05-02 NOTE — Assessment & Plan Note (Signed)
Summary: NEW MEDICARE PT--#--PKG--STC   Vital Signs:  Patient profile:   77 year old female Height:      68 inches (172.72 cm) Weight:      177.0 pounds (80.45 kg) O2 Sat:      94 % on Room air Temp:     97.5 degrees F (36.39 degrees C) oral Pulse rate:   76 / minute BP sitting:   120 / 62  (left arm) Cuff size:   regular  Vitals Entered By: Orlan Leavens RMA (April 24, 2010 2:10 PM)  O2 Flow:  Room air CC: New patient Is Patient Diabetic? No Pain Assessment Patient in pain? no        Primary Care Provider:  Newt Lukes MD  CC:  New patient.  History of Present Illness: new pt to me and our division, here to est care - prev with dr. Raquel James  reviewed chronic med issues: HTN - reports compliance with ongoing medical treatment and no changes in medication dose or frequency. denies adverse side effects related to current therapy.   dyslipidemia - reports compliance with ongoing medical treatment and no changes in medication dose or frequency. denies adverse side effects related to current therapy.   hypothyroid - reports compliance with ongoing medical treatment and no changes in medication dose or frequency. denies adverse side effects related to current therapy.   anxiety - manifest with insomnia and rumination thoughs at bedtime - use soma as needed to control same  Preventive Screening-Counseling & Management  Alcohol-Tobacco     Alcohol drinks/day: 0     Alcohol Counseling: not indicated; patient does not drink     Smoking Status: never     Tobacco Counseling: to remain off tobacco products  Caffeine-Diet-Exercise     Does Patient Exercise: yes     Type of exercise: walk     Times/week: 5     Exercise Counseling: not indicated; exercise is adequate  Safety-Violence-Falls     Seat Belt Counseling: not indicated; patient wears seat belts     Firearm Counseling: not applicable     Violence Counseling: not indicated; no violence risk noted  Clinical  Review Panels:  Prevention   Last Mammogram:  No specific mammographic evidence of malignancy.   (04/01/2003)   Last Pap Smear:  Interpretation Result:Negative for intraepithelial Lesion or Malignancy.    (04/01/2003)   Last Colonoscopy:  Location:  [facility].  Results: Normal. (09/29/2006)  Immunizations   Last Tetanus Booster:  Historical (03/31/2001)   Last Pneumovax:  Historical (03/31/2009)  Lipid Management   Cholesterol:  166 (03/29/2010)   LDL (bad choesterol):  100 (03/29/2010)   HDL (good cholesterol):  42 (03/29/2010)   Triglycerides:  120 (03/29/2010)  Diabetes Management   HgBA1C:  6.0 (03/29/2010)   Creatinine:  0.80 (03/29/2010)   Last Dilated Eye Exam:  normal (03/31/2009)   Last Pneumovax:  Historical (03/31/2009)  CBC   WBC:  9.7 (03/29/2010)   RBC:  3.47 (03/29/2010)   Hgb:  11.1 (03/29/2010)   Hct:  33.6 (03/29/2010)   Platelets:  292 (03/29/2010)   MCV  97 (03/29/2010)   RDW  19.5 (03/29/2010)   PMN:  65 (03/29/2010)   Monos:  7 (03/29/2010)   Eosinophils:  3 (03/29/2010)   Basophil:  1 (03/29/2010)  Complete Metabolic Panel   Glucose:  99 (03/29/2010)   Sodium:  141 (03/29/2010)   Potassium:  4.1 (03/29/2010)   Chloride:  105 (03/29/2010)   CO2:  23 (03/29/2010)   BUN:  16 (03/29/2010)   Creatinine:  0.80 (03/29/2010)   Albumin:  4.3 (03/29/2010)   Total Protein:  7.1 (03/29/2010)   Calcium:  9.5 (03/29/2010)   Total Bili:  0.4 (03/29/2010)   Alk Phos:  100 (03/29/2010)   SGPT (ALT):  19 (03/29/2010)   SGOT (AST):  20 (03/29/2010)   -  Date:  03/29/2010    WBC: 9.7    HGB: 11.1    HCT: 33.6    RBC: 3.47    PLT: 292    MCV: 97    RDW: 19.5    Neutrophil: 65    Lymphs: 24    Monos: 7    Eos: 3    Basophil: 1    BG Random: 99    BUN: 16    Creatinine: 0.80    Sodium: 141    Potassium: 4.1    Chloride: 105    CO2 Total: 23    SGOT (AST): 20    SGPT (ALT): 19    T. Bilirubin: 0.4    Alk Phos: 100     Calcium: 9.5    Total Protein: 7.1    Albumin: 4.3    Cholesterol: 166    LDL: 100    HDL: 42    Triglycerides: 956    HgbA1c: 6.0    TSH: 2.190  Current Medications (verified): 1)  Ambien 10 Mg Tabs (Zolpidem Tartrate) .... One Tablet By Mouth At Bedtime 2)  Multivitamins  Tabs (Multiple Vitamin) .... About 3x A Week 3)  Aspirin 81 Mg Tbec (Aspirin) .... Take One Tablet By Mouth Daily 4)  Zetia 10 Mg Tabs (Ezetimibe) .... Take One Tablet By Mouth Daily. 5)  Synthroid 112 Mcg Tabs (Levothyroxine Sodium) .... Take 1 Tablet By Mouth Once A Day 6)  Naproxen Sodium 550 Mg Tabs (Naproxen Sodium) .... As Needed 7)  Soma 350 Mg Tabs (Carisoprodol) .... Take As Needed 8)  Lotrel 10-20 Mg Caps (Amlodipine Besy-Benazepril Hcl) .... Take 1 By Mouth Once Daily  Allergies (verified): No Known Drug Allergies  Past History:  Past Medical History: Hypertension Hyperlipidemia Anxiety Hypothyroidism IBS-C Diverticulosis Hemorrhoids Low back pain Arthritis anemia, mild hyperglycemia - borderline DM  MD roster: card - [facility] - [facility] - branch at [facility] - [facility] - tannenbaum/daine at [facility]      Past Surgical History: back surgery x 2 hemorrhoidectomy  angioplasty 1995 tubal ligation breast surgery-benign appendectomy  Family History: No FH of Colon Cancer: Lung cancer: father  no other fh known - sister in good health age 78y  Social History: Married -lives with spouse Nadine Counts x 67y 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 Alcohol Use - no Illicit Drug Use - no Patient has never smoked Daily Caffeine Use Does Patient Exercise:  yes  Review of Systems       see HPI above. I have reviewed all other systems and they were negative.   Physical Exam  General:  large frame elderly woman, alert, well-developed, well-nourished, and cooperative to examination.    Head:  Normocephalic and atraumatic without obvious abnormalities. No  apparent alopecia or balding. Eyes:  vision grossly intact; pupils equal, round and reactive to light.  conjunctiva and lids normal.    Ears:  R ear normal and L ear normal.   Mouth:  teeth and gums in good repair; mucous membranes moist, without lesions or ulcers. oropharynx clear without exudate, no erythema.  Neck:  supple, full ROM, no masses, no thyromegaly; no thyroid nodules or tenderness. no JVD or carotid bruits.   Lungs:  normal respiratory effort, no intercostal retractions or use of accessory muscles; normal breath sounds bilaterally - no crackles and no wheezes.    Heart:  normal rate, regular rhythm, no murmur, and no rub. BLE without edema. Abdomen:  soft, non-tender, normal bowel sounds, no distention; no masses and no appreciable hepatomegaly or splenomegaly.   Genitalia:  defer Msk:  No deformity or scoliosis noted of thoracic or lumbar spine.   Neurologic:  alert & oriented X3 and cranial nerves II-XII symetrically intact.  strength normal in all extremities, sensation intact to light touch, and gait normal. speech fluent without dysarthria or aphasia; follows commands with good comprehension.  Skin:  no rashes, vesicles, ulcers, or erythema. No nodules or irregularity to palpation.  Psych:  Oriented X3, memory intact for recent and remote, normally interactive, good eye contact, not anxious appearing, not depressed appearing, and not agitated.     Diabetes Management Exam:    Eye Exam:       Eye Exam done elsewhere          Date: 03/31/2009          Results: normal          Done by: [facility]   Impression & Recommendations:  Problem # 1:  HYPOTHYROIDISM (ICD-244.9)  Her updated medication list for this problem includes:    Synthroid 112 Mcg Tabs (Levothyroxine sodium) .Marland Kitchen... Take 1 tablet by mouth once a day  Labs Reviewed: TSH: 2.190 (03/29/2010)    HgBA1c: 6.0 (03/29/2010) Chol: 166 (03/29/2010)   HDL: 42 (03/29/2010)   LDL: 100 (03/29/2010)   TG: 120  (03/29/2010)  Problem # 2:  HYPERLIPIDEMIA (ICD-272.4)  Her updated medication list for this problem includes:    Zetia 10 Mg Tabs (Ezetimibe) .Marland Kitchen... Take one tablet by mouth daily.  Labs Reviewed: SGOT: 20 (03/29/2010)   SGPT: 19 (03/29/2010)   HDL:42 (03/29/2010)  LDL:100 (03/29/2010)  Chol:166 (03/29/2010)  Trig:120 (03/29/2010)  Problem # 3:  HYPERTENSION (ICD-401.9)  Her updated medication list for this problem includes:    Lotrel 10-20 Mg Caps (Amlodipine besy-benazepril hcl) .Marland Kitchen... Take 1 by mouth once daily  BP today: 120/62 Prior BP: 189/90 (03/20/2010)  Labs Reviewed: K+: 4.1 (03/29/2010) Creat: : 0.80 (03/29/2010)   Chol: 166 (03/29/2010)   HDL: 42 (03/29/2010)   LDL: 100 (03/29/2010)   TG: 120 (03/29/2010)  Problem # 4:  DIABETES MELLITUS, BORDERLINE (ICD-790.29) monitor a1c q10mo and advised attn to diet and weight control - a1c 6.0 in 2011 - Labs Reviewed: Creat: 0.80 (03/29/2010)     Last Eye Exam: normal (03/31/2009)  Time spent with patient 38 minutes, more than 50% of this time was spent counseling patient on chronic heath concerns, current meds and reviewe of records from prior PCP brought with pt today  Complete Medication List: 1)  Ambien 10 Mg Tabs (Zolpidem tartrate) .... One tablet by mouth at bedtime 2)  Multivitamins Tabs (Multiple vitamin) .... About 3x a week 3)  Aspirin 81 Mg Tbec (Aspirin) .... Take one tablet by mouth daily 4)  Zetia 10 Mg Tabs (Ezetimibe) .... Take one tablet by mouth daily. 5)  Synthroid 112 Mcg Tabs (Levothyroxine sodium) .... Take 1 tablet by mouth once a day 6)  Naproxen Sodium 550 Mg Tabs (Naproxen sodium) .... As needed 7)  Soma 350 Mg Tabs (Carisoprodol) .... Take as needed  8)  Lotrel 10-20 Mg Caps (Amlodipine besy-benazepril hcl) .... Take 1 by mouth once daily  Patient Instructions: 1)  it was good to see you today. 2)  labs and history reviewed today - no medication changes needed - continue same and let us know when  refills are needed 3)  Please schedule a follow-up appointment in 4-6 months to monitor thyroid and cholesterol, call sooner if problems.    Orders Added: 1)  New Patient Level III [99203]   Immunization History:  Tetanus/Td Immunization History:    Tetanus/Td:  historical (03/31/2001)  Pneumovax Immunization History:    Pneumovax:  historical (03/31/2009)   Immunization History:  Tetanus/Td Immunization History:    Tetanus/Td:  Historical (03/31/2001)  Pneumovax Immunization History:    Pneumovax:  Historical (03/31/2009)    Mammogram  Procedure date:  04/01/2003  Findings:      No specific mammographic evidence of malignancy.    Pap Smear  Procedure date:  04/01/2003  Findings:      Interpretation Result:Negative for intraepithelial Lesion or Malignancy.

## 2010-05-02 NOTE — Progress Notes (Signed)
Summary: rx refill req  Phone Note Refill Request Message from:  Fax from Pharmacy on April 26, 2010 5:23 PM  Refills Requested: Medication #1:  ZETIA 10 MG TABS Take one tablet by mouth daily.   Dosage confirmed as above?Dosage Confirmed   Last Refilled: 03/29/2010  Method Requested: Electronic Next Appointment Scheduled: none Initial call taken by: Brenton Grills CMA Duncan Dull),  April 26, 2010 5:23 PM    Prescriptions: ZETIA 10 MG TABS (EZETIMIBE) Take one tablet by mouth daily.  #30 x 3   Entered by:   Brenton Grills CMA (AAMA)   Authorized by:   Newt Lukes MD   Signed by:   Brenton Grills CMA (AAMA) on 04/26/2010   Method used:   Electronically to        [facility] [address] (retail)       [address]       [place], [place]  [address]       Ph: [phone]       Fax: [phone]   RxID:   [id number]

## 2010-05-08 NOTE — Letter (Signed)
Summary: Ursula Beath MD  Ursula Beath MD   Imported By: Sherian Rein 05/02/2010 12:39:56  _____________________________________________________________________  External Attachment:    Type:   Image     Comment:   External Document

## 2010-05-13 ENCOUNTER — Ambulatory Visit: Payer: Self-pay | Admitting: Internal Medicine

## 2010-06-11 LAB — URINALYSIS, ROUTINE W REFLEX MICROSCOPIC
Bilirubin Urine: NEGATIVE
Glucose, UA: NEGATIVE mg/dL
Hgb urine dipstick: NEGATIVE
Ketones, ur: NEGATIVE mg/dL
Nitrite: NEGATIVE
Protein, ur: NEGATIVE mg/dL
Specific Gravity, Urine: 1.016 (ref 1.005–1.030)
Urobilinogen, UA: 0.2 mg/dL (ref 0.0–1.0)
pH: 6 (ref 5.0–8.0)

## 2010-06-11 LAB — CBC
HCT: 36.5 % (ref 36.0–46.0)
Hemoglobin: 12.3 g/dL (ref 12.0–15.0)
MCH: 33.2 pg (ref 26.0–34.0)
MCHC: 33.7 g/dL (ref 30.0–36.0)
MCV: 98.4 fL (ref 78.0–100.0)
Platelets: 264 10*3/uL (ref 150–400)
RBC: 3.71 MIL/uL — ABNORMAL LOW (ref 3.87–5.11)
RDW: 18.7 % — ABNORMAL HIGH (ref 11.5–15.5)
WBC: 14.8 10*3/uL — ABNORMAL HIGH (ref 4.0–10.5)

## 2010-06-11 LAB — POCT CARDIAC MARKERS
CKMB, poc: 1.9 ng/mL (ref 1.0–8.0)
Myoglobin, poc: 256 ng/mL (ref 12–200)
Troponin i, poc: 0.06 ng/mL (ref 0.00–0.09)

## 2010-06-11 LAB — BASIC METABOLIC PANEL
BUN: 20 mg/dL (ref 6–23)
CO2: 25 mEq/L (ref 19–32)
Calcium: 8.9 mg/dL (ref 8.4–10.5)
Chloride: 105 mEq/L (ref 96–112)
Creatinine, Ser: 1.1 mg/dL (ref 0.4–1.2)
GFR calc Af Amer: 57 mL/min — ABNORMAL LOW (ref >60)
GFR calc non Af Amer: 47 mL/min — ABNORMAL LOW (ref >60)
Glucose, Bld: 155 mg/dL — ABNORMAL HIGH (ref 70–99)
Potassium: 4.9 mEq/L (ref 3.5–5.1)
Sodium: 139 mEq/L (ref 135–145)

## 2010-06-11 LAB — COMPREHENSIVE METABOLIC PANEL
ALT: 21 U/L (ref 0–35)
AST: 26 U/L (ref 0–37)
Albumin: 3 g/dL — ABNORMAL LOW (ref 3.5–5.2)
Alkaline Phosphatase: 49 U/L (ref 39–117)
BUN: 17 mg/dL (ref 6–23)
CO2: 25 mEq/L (ref 19–32)
Calcium: 7.8 mg/dL — ABNORMAL LOW (ref 8.4–10.5)
Chloride: 112 mEq/L (ref 96–112)
Creatinine, Ser: 0.79 mg/dL (ref 0.4–1.2)
GFR calc Af Amer: >60 mL/min (ref >60)
GFR calc non Af Amer: >60 mL/min (ref >60)
Glucose, Bld: 96 mg/dL (ref 70–99)
Potassium: 3.5 mEq/L (ref 3.5–5.1)
Sodium: 140 mEq/L (ref 135–145)
Total Bilirubin: 0.5 mg/dL (ref 0.3–1.2)
Total Protein: 5.5 g/dL — ABNORMAL LOW (ref 6.0–8.3)

## 2010-06-11 LAB — CARDIAC PANEL(CRET KIN+CKTOT+MB+TROPI)
CK, MB: 2 ng/mL (ref 0.3–4.0)
CK, MB: 2.6 ng/mL (ref 0.3–4.0)
Relative Index: 1.6 (ref 0.0–2.5)
Relative Index: 2.1 (ref 0.0–2.5)
Total CK: 125 U/L (ref 7–177)
Total CK: 126 U/L (ref 7–177)
Troponin I: 0.02 ng/mL (ref 0.00–0.06)
Troponin I: 0.02 ng/mL (ref 0.00–0.06)

## 2010-06-11 LAB — TSH: TSH: 3.509 u[IU]/mL (ref 0.350–4.500)

## 2010-06-11 LAB — CK TOTAL AND CKMB (NOT AT ARMC)
CK, MB: 2.7 ng/mL (ref 0.3–4.0)
Relative Index: 2.3 (ref 0.0–2.5)
Total CK: 115 U/L (ref 7–177)

## 2010-06-11 LAB — MRSA PCR SCREENING: MRSA by PCR: NEGATIVE

## 2010-06-11 LAB — TROPONIN I: Troponin I: 0.03 ng/mL (ref 0.00–0.06)

## 2010-07-25 ENCOUNTER — Other Ambulatory Visit: Payer: Self-pay | Admitting: Internal Medicine

## 2010-08-13 NOTE — Assessment & Plan Note (Signed)
[facility]                         GASTROENTEROLOGY OFFICE NOTE   NAME:Gates, Brittney S                        MRN:          [id number]  DATE:09/09/2006                            DOB:          1924/11/28    REFERRING PHYSICIAN:  Talmadge Coventry, M.D.   CHIEF COMPLAINT:  Change in bowels.   ASSESSMENT:  A 77 year old white woman who has had increasing straining  to defecate with narrow stools. Etiology not entirely clear.  Possibilities include; colorectal neoplasia, diverticulosis, small  rectocele, difficultly with evacuation related to previous back surgery  and nerve damage.   PLAN:  1. MiraLax daily.  2. Colonoscopy to rule out colorectal neoplasia as a cause, possible      diverticulosis.  3. Further plans pending the above. Risks, benefits, and indications      of colonoscopy are discussed with patient. She understands and      agrees to proceed.   HISTORY:  This 77 year old woman had a colonoscopy in [place] about 10  years ago and says it was ok. Ever since she had hemorrhoid surgery  years ago, she has had some difficultly with evacuation in that she has  to kind of push out the final portion of her bowel movement by squeezing  in the anal area. She does not insert her finger into the vagina  however. Lately she has had more difficultly with initiation of bowel  movements and smaller caliber stools but no bleeding. She does not  describe any weight loss. She is not really describing much in the way  of bleeding if any. GI review of systems otherwise negative.   MEDICATIONS:  1. Lotrel 5-10/20 daily.  2. Aspirin 325 mg daily.  3. Synthroid 0.112 mg daily.  4. Ambien 5 mg at bedtime.  5. Zetia 20 mg daily.  6. Vitamin E daily.  7. Vitamin D as well.   DRUG ALLERGIES:  None known.   PAST MEDICAL HISTORY:  1. Colonoscopy, apparently normal as above.  2. Hypertension.  3. Dyslipidemia.  4. Hypothyroidism.  5.  Osteoarthritis.  6. Anxiety.  7. Two low back surgeries with previous history of kyphoplasty as      well.  8. Hemorrhoidectomy.  9. Angioplasty 1995.  10.Tubal ligation.  11.Benign breast surgery.  12.Appendectomy.   FAMILY HISTORY:  No colon cancer reported.   SOCIAL HISTORY:  She is married. She lives with her husband, he is 47  and functional, 3 sons, 1 daughter, college educated. No alcohol, no  tobacco, or drugs.   REVIEW OF SYSTEMS:  See my medical history form for full details of  this. Notable for previous multiple transfusions years ago, some joint  pain.   PHYSICAL EXAMINATION:  Reveals a well-developed elderly white woman in  no acute distress. Height 5 feet 8 inches, weight 180 pounds, blood  pressure 130/68, pulse 72.  EYES: Anicteric.  MOUTH: Free of lesions.  NECK: Supple, no thyromegaly or mass.  CHEST: Clear.  HEART: S1, S2. I hear no rubs, murmurs, or gallops.  ABDOMEN: Soft, there is a midline ventral hernia, easily reducible,  above the umbilicus, is otherwise nontender. There are surgical scars  present. Bowel sounds are present.  RECTAL EXAM: In the presence of female nursing staff, shows brown hard  stool, heme negative. There is no mass. There is no significant anal  stenosis. There are some old hemorrhoids. There is a small-to-medium  sized rectocele.  LYMPHATIC: No neck, supraclavicular, or groin adenopathy.  LOWER EXTREMITIES: Free of edema.  SKIN: Somewhat pale but free of rash.  NEURO/PSYCH: She is alert and oriented x3.   LABORATORY DATA:  Hemoglobin 11.8, normal MCV from May 28. White count,  platelets normal, BMET, and CMET normal except for glucose 105, TSH  normal.   I appreciate the opportunity to care for this patient.     Iva Boop, MD,FACG  Electronically Signed    CEG/MedQ  DD: 09/09/2006  DT: 09/09/2006  Job #: [id number]   cc:   Talmadge Coventry, M.D.

## 2010-08-13 NOTE — Consult Note (Signed)
Brittney Gates, Brittney Gates                 ACCOUNT NO.:  [id number]   MEDICAL RECORD NO.:  [id number]          PATIENT TYPE:  EMS   LOCATION:  [facility]                         FACILITY:  [facility]   PHYSICIAN:  Veverly Fells. Excell Seltzer, MD  DATE OF BIRTH:  11-Nov-1924   DATE OF CONSULTATION:  12/23/2007  DATE OF DISCHARGE:                                 CONSULTATION   REASON FOR CONSULTATION:  Chest pain.   HISTORY OF PRESENT ILLNESS:  Ms. Mcbain is an 77 year old woman with no  prior cardiac history.  She describes a 45-minute episode of chest pain  that occurred at rest this afternoon.  She was waiting for a doctor's  appointment and developed sudden onset of an uncomfortable feeling in  her chest.  She states I could feel each heart beat.  There were no  associated symptoms.  She specifically denies dyspnea, orthopnea, PND,  lightheadedness, syncope or diaphoresis.  She was with her son who  recommended to come to the emergency department.  By the time she  arrived, her symptoms had resolved completely.   She reports a similar episode that occurred in 1998.  She had an  extensive cardiac workup done in [place].  She tells me that she had a  normal cardiac cath.  She was told that she had the heart of a 54-  year-old.  She has had no problems in the interim.  She has no other  complaints today.  She continues with an active lifestyle and has no  exertional symptoms.   PAST MEDICAL HISTORY:  1. Essential hypertension.  2. Hypercholesterolemia.  3. Hypothyroidism.  4. Lumbar back surgery x2.  5. Vertebroplasty.  6. Remote C-section.  7. Herpes zoster.  8. Remote appendectomy.  9. Right axillary lymph node excision (benign).  10.Vein stripping.  11.Early macular degeneration.   HOME MEDICATIONS:  Lotrel, Synthroid, Valtrex, aspirin 81 mg, Ambien as  needed, ICaps, Zetia 10 mg, vitamin D and fish oil.   ALLERGIES:  NKDA.   SOCIAL HISTORY:  The patient lives locally in [place].  She is  retired and worked as an Manufacturing engineer.  She has no history of  tobacco or alcohol.   FAMILY HISTORY:  Noncontributory.   REVIEW OF SYSTEMS:  A complete 12 point review of systems was performed.  Other than chronic low back pain with radiation of pain down the right  leg and some numbness in the right leg, all other systems were negative.   PHYSICAL EXAMINATION:  GENERAL:  The patient is alert and oriented.  She  is an elderly woman, appears younger than her stated age.  She is in no  acute distress.  VITAL SIGNS:  Heart rate 55 beats for per minute.  Blood pressure  140/68, respiratory rate 18.  Oxygen saturations 100% on room air.  HEENT:  Normal.  NECK:  Normal carotid upstrokes.  No bruits.  JVP normal.  No  thyromegaly or thyroid nodules.  LUNGS:  Clear to auscultation  bilaterally.  ABDOMEN:  Soft, nontender.  No organomegaly.  CARDIOVASCULAR:  The apex is discreet, nondisplaced.  There is no right  ventricular heave or lift.  HEART:  Regular rate and rhythm without  murmurs or gallops.  BACK:  No CVA tenderness.  EXTREMITIES:  No clubbing, cyanosis or edema.  Peripheral pulses are 2+  and equal throughout.  SKIN:  Warm and dry without rash.  NEUROLOGIC:  Cranial nerves II-XII are intact.  Strength intact and  equal bilaterally.   LABORATORY DATA:  Reviewed.  Troponin less than 0.05, CK-MB 3.9.  Myoglobin 178, potassium 4.0, creatinine 1.1, hemoglobin 12.6,  hematocrit 37.   EKG shows sinus bradycardia and is otherwise within normal limits.   ASSESSMENT:  This is an 77 year old woman with a background of  hypertension and dyslipidemia, who presents with atypical chest pain.  Her symptoms may very well be cardiac.  However, they sound much like an  arrhythmia.  Considering her age and longstanding hypertension, she is  certainly a risk for atrial fibrillation.  I think this is the most  likely etiology of her symptoms.  This does not sound clinically like a   coronary event.  This is especially true with her history of normal  coronary arteries even though 10 years have passed since her last  catheterization.  She feels fine at present and with a normal EKG, I am  comfortable in discharging her from the emergency room.  I think she  should have an outpatient adenosine Myoview scan to rule out ischemia.  If this is negative and she has recurrent symptoms, then I would  recommend outpatient event recording.  No medication changes were made  today.  I plan on seeing her back in follow-up in the office after her  Myoview stress scan is completed.  She was advised if any symptoms  recur, she needs to return immediately for evaluation.      Veverly Fells. Excell Seltzer, MD  Electronically Signed     MDC/MEDQ  D:  12/23/2007  T:  12/23/2007  Job:  [id number]   cc:   Talmadge Coventry, M.D.  Coletta Memos, M.D.

## 2010-08-13 NOTE — Assessment & Plan Note (Signed)
[facility]                            CARDIOLOGY OFFICE NOTE   NAME:Huss, Essence S                        MRN:          [id number]  DATE:01/11/2008                            DOB:          1925/02/07    REASON FOR VISIT:  Chest pain, followup emergency department evaluation.   HISTORY OF PRESENT ILLNESS:  Ms. Bloch is a delightful 77 year old  woman who I saw in consultation in the emergency department on December 23, 2007.  She presented with an episode of chest pain.  She has a  background history of hypertension and hypercholesterolemia.  Her pain  had resolved and she had no high-risk features at the time of  evaluation.  Her EKG was normal and initial cardiac markers were normal.  She has a history of normal cardiac catheterization 10 years ago.  She  was discharged from the emergency room and followed up with adenosine  Myoview stress scan.  This was completed on January 03, 2008, and showed  apical thinning with overall normal perfusion.  There was no change from  rest to stress and the LV ejection fraction was 59%.   From a symptomatic standpoint, she is doing well.  She reports one  episode of left-sided chest pain that occurred in a localized area and  only lasted a few seconds.  The pain occurred at rest.  She has had no  further symptoms.  She specifically denies exertional chest tightness or  pain.  She denies dyspnea, orthopnea, PND, or edema.   MEDICATIONS:  1. Lotrel 5/20 mg daily.  2. Synthroid 0.112 mg daily.  3. Ambien 5 mg daily.  4. Zetia 10 mg daily.  5. Vitamin D weekly.  6. Aspirin 81 mg daily.  7. Vitamin D 2000 international units daily.  8. Valtrex 1 daily.  9. ICaps 2 daily.  10.Vitamin B12 one daily.   ALLERGIES:  NKDA.   PHYSICAL EXAMINATION:  GENERAL:  The patient is alert and oriented.  She  is in no acute distress.  She is an elderly woman.  VITAL SIGNS:  Weight is 171 pounds, blood pressure 118/60, heart  rate  64, and respiratory rate 12.  HEENT:  Normal.  NECK:  Normal carotid upstrokes.  No bruits.  JVP normal.  LUNGS:  Clear bilaterally.  HEART:  Regular rate and rhythm.  No murmurs or gallops.  ABDOMEN:  Soft, nontender.  No organomegaly.  EXTREMITIES:  No clubbing, cyanosis, or edema.  Peripheral pulses are 2+  and equal throughout.   ASSESSMENT:  1. Atypical chest pain.  No evidence of ischemia on nuclear stress      testing.  Continue primary risk reduction with respect to      dyslipidemia and hypertension.  She is under the care of Dr.      Ivor Costa  for these problems.  2. Hypertension.  Blood pressure under good control on current      regimen.  3. Dyslipidemia.  I do not have a copy of her lipids.  She may be      intolerant to statins  based on her report.  She is currently on      Zetia.   For followup, I would like to see Ms. Ericsson back in 12 months.  If she  has problems in the interim, I would be happy to see her sooner.  Of  note, when she presented to the ER, I thought her symptoms may be  related to arrhythmia and specifically atrial fib.  If she has recurrent  symptoms, I would recommend event recording.  At present, she is  asymptomatic and I do not see the benefit of this unless she has  recurrent problems.     Veverly Fells. Excell Seltzer, MD  Electronically Signed    MDC/MedQ  DD: 01/11/2008  DT: 01/12/2008  Job #: [id number]   cc:   Coletta Memos, M.D.

## 2010-08-16 ENCOUNTER — Other Ambulatory Visit: Payer: Self-pay | Admitting: *Deleted

## 2010-08-16 MED ORDER — ZOLPIDEM TARTRATE 10 MG PO TABS: 10.0000 mg | ORAL_TABLET | Freq: Every evening | ORAL | Status: DC | PRN

## 2010-08-16 NOTE — Telephone Encounter (Signed)
Faxed script back to [facility] @ [phone].Marland KitchenMarland Kitchen5/18/12@1 ;13pm/LMB

## 2010-08-29 DIAGNOSIS — R7309 Other abnormal glucose: Secondary | ICD-10-CM

## 2010-09-02 ENCOUNTER — Telehealth: Payer: Self-pay | Admitting: *Deleted

## 2010-09-02 MED ORDER — AMLODIPINE BESY-BENAZEPRIL HCL 5-20 MG PO CAPS: 1.0000 | ORAL_CAPSULE | Freq: Every day | ORAL | Status: DC

## 2010-09-02 NOTE — Telephone Encounter (Signed)
Pharmacy is requesting refill on Amlodipine Besy-Benazepril 5-20mg  Cap  NP on 04/24/2010: Amlodipine Besy-Benazepril 10-20mg  Cap [no changes to meds in chart] Please Advise.

## 2010-09-02 NOTE — Telephone Encounter (Signed)
Med dose changed as requested by pharmacy - ok to refill 5-20 dose - thanks

## 2010-09-02 NOTE — Telephone Encounter (Signed)
Rx Done . 

## 2010-09-09 ENCOUNTER — Ambulatory Visit (INDEPENDENT_AMBULATORY_CARE_PROVIDER_SITE_OTHER): Payer: Medicare Other | Admitting: Internal Medicine

## 2010-09-09 ENCOUNTER — Other Ambulatory Visit: Payer: Self-pay | Admitting: Internal Medicine

## 2010-09-09 ENCOUNTER — Other Ambulatory Visit (INDEPENDENT_AMBULATORY_CARE_PROVIDER_SITE_OTHER): Payer: Medicare Other

## 2010-09-09 ENCOUNTER — Encounter: Payer: Self-pay | Admitting: Internal Medicine

## 2010-09-09 DIAGNOSIS — E039 Hypothyroidism, unspecified: Secondary | ICD-10-CM

## 2010-09-09 DIAGNOSIS — I10 Essential (primary) hypertension: Secondary | ICD-10-CM

## 2010-09-09 DIAGNOSIS — E785 Hyperlipidemia, unspecified: Secondary | ICD-10-CM

## 2010-09-09 LAB — LIPID PANEL
Cholesterol: 231 mg/dL — ABNORMAL HIGH (ref 0–200)
HDL: 53.9 mg/dL (ref >39.00)
Total CHOL/HDL Ratio: 4
Triglycerides: 176 mg/dL — ABNORMAL HIGH (ref 0.0–149.0)
VLDL: 35.2 mg/dL (ref 0.0–40.0)

## 2010-09-09 LAB — LDL CHOLESTEROL, DIRECT: Direct LDL: 153.3 mg/dL

## 2010-09-09 LAB — TSH: TSH: 0.99 u[IU]/mL (ref 0.35–5.50)

## 2010-09-09 MED ORDER — FLUTICASONE PROPIONATE 50 MCG/ACT NA SUSP: 1.0000 | Freq: Every day | NASAL | Status: DC

## 2010-09-09 NOTE — Patient Instructions (Signed)
It was good to see you today. We have reviewed your prior records including labs and tests today Test(s) ordered today. Your results will be called to you after review (48-72hours after test completion). If any changes need to be made, you will be notified at that time. Use nasal steroid spray for irritation symptoms - Your prescription(s) have been submitted to your pharmacy. Please take as directed and contact our office if you believe you are having problem(s) with the medication(s). Let us know if not improving for re-evaluation or referral if needed Please schedule followup in 6 months, call sooner if problems.

## 2010-09-09 NOTE — Progress Notes (Signed)
  Subjective:    Patient ID: Brittney Gates, female    DOB: [birth year], 76 y.o.   MRN: [id number]  HPI Here for follow up - reviewed chronic med issues:   HTN - reports compliance with ongoing medical treatment and no changes in medication dose or frequency. denies adverse side effects related to current therapy.   dyslipidemia - on zetia, no statin - reports compliance with ongoing medical treatment and no changes in medication dose or frequency. denies adverse side effects related to current therapy.   hypothyroid - reports compliance with ongoing medical treatment and no changes in medication dose or frequency. denies adverse side effects related to current therapy.   anxiety - manifest with insomnia and rumination thoughs at bedtime - use soma as needed to control same   Past Medical History  Diagnosis Date  . DIABETES MELLITUS, BORDERLINE   . LOW BACK PAIN, CHRONIC   . Diverticulosis   . ANEMIA   . ANXIETY   . Arthritis   . HYPERTENSION   . HYPERLIPIDEMIA   . HYPOTHYROIDISM      Review of Systems  Constitutional: Negative for fever.  HENT: Positive for congestion.        Burning, intermittent pain at tip B nares x 1 month  Respiratory: Negative for shortness of breath.   Cardiovascular: Negative for chest pain.  Neurological: Negative for syncope.       Objective:   Physical Exam BP 130/62  Pulse 77  Temp(Src) 98.2 F (36.8 C) (Oral)  Ht 5\' 8"  (1.727 m)  Wt 179 lb 12.8 oz (81.557 kg)  BMI 27.34 kg/m2  SpO2 97% Physical Exam  Constitutional: She is large frame older woman; oriented to person, place, and time. She appears well-developed and well-nourished. No distress.  HENT: NCAT, nostril with dry mucosal membranes, no discharge, swelling or ulceration. OP clear Eyes: Conjunctivae and EOM are normal. Pupils are equal, round, and reactive to light. No scleral icterus.  Neck: Normal range of motion. Neck supple. No JVD present. No thyromegaly present.    Cardiovascular: Normal rate, regular rhythm and normal heart sounds.  No murmur heard. No BLE edema. Pulmonary/Chest: Effort normal and breath sounds normal. No respiratory distress. She has no wheezes.  Psychiatric: She has a normal mood and affect. Her behavior is normal. Judgment and thought content normal.   Lab Results  Component Value Date   WBC 9.7 03/29/2010   HGB 11.1 03/29/2010   HCT 33.6 03/29/2010   PLT 292 03/29/2010   CHOL 166 03/29/2010   HDL 42 03/29/2010   ALT 19 03/29/2010   AST 20 03/29/2010   NA 141 03/29/2010   K 4.1 03/29/2010   CL 105 03/29/2010   CREATININE 0.80 03/29/2010   BUN 16 03/29/2010   CO2 23 03/29/2010   TSH 2.190 03/29/2010   HGBA1C 6.0 03/29/2010        Assessment & Plan:  See problem list. Medications and labs reviewed today.

## 2010-09-09 NOTE — Assessment & Plan Note (Signed)
BP Readings from Last 3 Encounters:  09/09/10 130/62  04/24/10 120/62  03/20/10 189/90   The current medical regimen is effective;  continue present plan and medications.

## 2010-09-09 NOTE — Assessment & Plan Note (Signed)
On zetia, no statin The current medical regimen is effective;  continue present plan and medications. Check flp - adjust as needed

## 2010-09-09 NOTE — Assessment & Plan Note (Signed)
The current medical regimen is effective;  continue present plan and medications. Lab Results  Component Value Date   TSH 2.190 03/29/2010

## 2010-09-17 ENCOUNTER — Other Ambulatory Visit: Payer: Self-pay | Admitting: *Deleted

## 2010-09-17 MED ORDER — LEVOTHYROXINE SODIUM 125 MCG PO TABS: 125.0000 ug | ORAL_TABLET | Freq: Every day | ORAL | Status: DC

## 2010-09-17 NOTE — Telephone Encounter (Signed)
Received fax from [facility] requesting refill on synthroid 0.184mcg. Per our records we have 112 mcg. Is this ok to refill for 0.13msg? Pls advise

## 2010-09-17 NOTE — Telephone Encounter (Signed)
Sent 125 mcg to pharmacy. Updated EPIC.Marland KitchenMarland Kitchen6/19/12@3 :28pm/LMB

## 2010-09-17 NOTE — Telephone Encounter (Signed)
Yes - dose changed to - ok to send refill

## 2010-09-25 ENCOUNTER — Ambulatory Visit (INDEPENDENT_AMBULATORY_CARE_PROVIDER_SITE_OTHER)
Admission: RE | Admit: 2010-09-25 | Discharge: 2010-09-25 | Disposition: A | Discharge status: Home / Self Care | Payer: Medicare Other | Source: Ambulatory Visit | Attending: Internal Medicine | Admitting: Internal Medicine

## 2010-09-25 ENCOUNTER — Ambulatory Visit (INDEPENDENT_AMBULATORY_CARE_PROVIDER_SITE_OTHER): Payer: Medicare Other | Admitting: Internal Medicine

## 2010-09-25 ENCOUNTER — Encounter: Payer: Self-pay | Admitting: Internal Medicine

## 2010-09-25 DIAGNOSIS — M25519 Pain in unspecified shoulder: Secondary | ICD-10-CM

## 2010-09-25 DIAGNOSIS — M25512 Pain in left shoulder: Secondary | ICD-10-CM

## 2010-09-25 DIAGNOSIS — R51 Headache: Secondary | ICD-10-CM

## 2010-09-25 MED ORDER — METHOCARBAMOL 500 MG PO TABS: 500.0000 mg | ORAL_TABLET | Freq: Three times a day (TID) | ORAL | Status: DC | PRN

## 2010-09-25 NOTE — Progress Notes (Signed)
  Subjective:    Patient ID: Brittney Gates, female    DOB: [birth year], 77 y.o.   MRN: [id number]  HPI  complains of "funny head feeling" Onset 24h ago - improved since onset but not normal yet No vision changes, numbness, weakness Slightly dizzy and "off balance" No ear pain or sinus pressure No fever, no trauma associated with with pain in left neck and shoulder precipitated by accidental fall 06/2010 (but no headache until now) Not using soma due to sedation and not using anaprox due to concern for side effects   Past Medical History  Diagnosis Date  . DIABETES MELLITUS, BORDERLINE   . LOW BACK PAIN, CHRONIC   . Diverticulosis   . ANEMIA   . ANXIETY   . Arthritis   . HYPERTENSION   . HYPERLIPIDEMIA   . HYPOTHYROIDISM      Review of Systems  Constitutional: Negative for fatigue.  HENT: Negative for tinnitus.   Eyes: Negative for pain.  Neurological: Negative for tremors, seizures, syncope and weakness.  Psychiatric/Behavioral: Negative for behavioral problems and confusion.       Objective:   Physical Exam BP 122/60  Pulse 54  Temp(Src) 97.5 F (36.4 C) (Oral)  Ht 5\' 8"  (1.727 m)  SpO2 94% Physical Exam  Constitutional: She is oriented to person, place, and time. She appears well-developed and well-nourished. No distress.  HENT: Head: Normocephalic and atraumatic. Ears; B TMs ok, no erythema or effusion; Nose: Nose normal.  Mouth/Throat: Oropharynx is clear and moist. No oropharyngeal exudate.  Eyes: Conjunctivae and EOM are normal. Pupils are equal, round, and reactive to light. No scleral icterus.  Neck: Normal range of motion. Neck supple. No JVD present. No thyromegaly present.  Cardiovascular: Normal rate, regular rhythm and normal heart sounds.  No murmur heard. No BLE edema. Pulmonary/Chest: Effort normal and breath sounds normal. No respiratory distress. She has no wheezes. Musculoskeletal: left shoulder tender to palpation over acromial head - FROM with  good strength; myofascial spasm over left trap and neck Neurological: She is alert and oriented to person, place, and time. No cranial nerve deficit. Coordination normal.  Skin: Skin is warm and dry. No rash noted. No erythema.  Psychiatric: She has an anxious mood and affect. Her behavior is normal. Judgment and thought content normal.        Assessment & Plan:  Headache, improving but new event - exam nonfocal/benign - arrange CT and tx myofascial spasm with robaxin - erx done L shoulder pain - precipitated by trauma >2 mo ago - expect tenderness related to myofascial spasm but check xray rule out fx or other injury

## 2010-09-25 NOTE — Patient Instructions (Signed)
It was good to see you today. we'll make referral for CT scan of head. Our office will contact you regarding appointment(s) once made. Also shoulder xray ordered today. Your results will be called to you after review (48-72hours after test completion). If any changes need to be made, you will be notified at that time. Use robaxin in place of soma for neck and shoulder spasm pain - Your prescription(s) have been submitted to your pharmacy. Please take as directed and contact our office if you believe you are having problem(s) with the medication(s).

## 2010-10-08 ENCOUNTER — Ambulatory Visit (INDEPENDENT_AMBULATORY_CARE_PROVIDER_SITE_OTHER)
Admission: RE | Admit: 2010-10-08 | Discharge: 2010-10-08 | Disposition: A | Discharge status: Home / Self Care | Payer: Medicare Other | Source: Ambulatory Visit | Attending: Internal Medicine | Admitting: Internal Medicine

## 2010-10-08 DIAGNOSIS — R51 Headache: Secondary | ICD-10-CM

## 2010-10-28 ENCOUNTER — Other Ambulatory Visit: Payer: Self-pay | Admitting: *Deleted

## 2010-10-28 MED ORDER — ZOLPIDEM TARTRATE 10 MG PO TABS: 10.0000 mg | ORAL_TABLET | Freq: Every evening | ORAL | Status: DC | PRN

## 2010-10-28 NOTE — Telephone Encounter (Signed)
Faxed script back to [facility] @ [phone].Marland KitchenMarland Kitchen7/30/12@2 :06pm/LMB

## 2010-10-30 ENCOUNTER — Other Ambulatory Visit: Payer: Self-pay | Admitting: Internal Medicine

## 2010-11-22 ENCOUNTER — Other Ambulatory Visit: Payer: Self-pay | Admitting: Internal Medicine

## 2010-11-25 NOTE — Telephone Encounter (Signed)
Faxed script back to [facility].Marland KitchenMarland Kitchen8/27/12@10 :17am/LMB

## 2010-12-30 LAB — POCT I-STAT, CHEM 8
BUN: 19
Calcium, Ion: 1.2
Chloride: 105
Creatinine, Ser: 1.1
Glucose, Bld: 93
HCT: 37
Hemoglobin: 12.6
Potassium: 4
Sodium: 140
TCO2: 29

## 2010-12-30 LAB — POCT CARDIAC MARKERS
CKMB, poc: 3.9
Myoglobin, poc: 178
Troponin i, poc: <0.05

## 2011-01-03 ENCOUNTER — Other Ambulatory Visit: Payer: Self-pay | Admitting: *Deleted

## 2011-01-03 MED ORDER — METHOCARBAMOL 500 MG PO TABS: 500.0000 mg | ORAL_TABLET | Freq: Three times a day (TID) | ORAL | Status: DC

## 2011-01-06 ENCOUNTER — Other Ambulatory Visit: Payer: Self-pay | Admitting: Internal Medicine

## 2011-02-01 ENCOUNTER — Other Ambulatory Visit: Payer: Self-pay

## 2011-02-01 ENCOUNTER — Emergency Department (HOSPITAL_COMMUNITY)
Admission: EM | Admit: 2011-02-01 | Discharge: 2011-02-01 | Disposition: A | Discharge status: Home / Self Care | Payer: Medicare Other | Attending: Emergency Medicine | Admitting: Emergency Medicine

## 2011-02-01 DIAGNOSIS — Z79899 Other long term (current) drug therapy: Secondary | ICD-10-CM | POA: Insufficient documentation

## 2011-02-01 DIAGNOSIS — E039 Hypothyroidism, unspecified: Secondary | ICD-10-CM | POA: Insufficient documentation

## 2011-02-01 DIAGNOSIS — I10 Essential (primary) hypertension: Secondary | ICD-10-CM | POA: Insufficient documentation

## 2011-02-01 DIAGNOSIS — R112 Nausea with vomiting, unspecified: Secondary | ICD-10-CM | POA: Insufficient documentation

## 2011-02-01 LAB — POCT I-STAT, CHEM 8
BUN: 27 mg/dL — ABNORMAL HIGH (ref 6–23)
Calcium, Ion: 1.16 mmol/L (ref 1.12–1.32)
Chloride: 105 mEq/L (ref 96–112)
Creatinine, Ser: 1.1 mg/dL (ref 0.50–1.10)
Glucose, Bld: 122 mg/dL — ABNORMAL HIGH (ref 70–99)
HCT: 40 % (ref 36.0–46.0)
Hemoglobin: 13.6 g/dL (ref 12.0–15.0)
Potassium: 4.1 mEq/L (ref 3.5–5.1)
Sodium: 139 mEq/L (ref 135–145)
TCO2: 25 mmol/L (ref 0–100)

## 2011-02-01 LAB — GLUCOSE, CAPILLARY: Glucose-Capillary: 107 mg/dL — ABNORMAL HIGH (ref 70–99)

## 2011-02-01 LAB — POCT I-STAT TROPONIN I: Troponin i, poc: 0 ng/mL (ref 0.00–0.08)

## 2011-02-05 ENCOUNTER — Other Ambulatory Visit: Payer: Self-pay | Admitting: Internal Medicine

## 2011-02-11 ENCOUNTER — Other Ambulatory Visit: Payer: Self-pay | Admitting: Internal Medicine

## 2011-03-20 ENCOUNTER — Other Ambulatory Visit: Payer: Self-pay | Admitting: Internal Medicine

## 2011-03-21 NOTE — Telephone Encounter (Signed)
Faxed script back to [facility]/ [place]...03/21/11@2 :29pm/LMB

## 2011-04-10 DIAGNOSIS — M25579 Pain in unspecified ankle and joints of unspecified foot: Secondary | ICD-10-CM | POA: Diagnosis not present

## 2011-04-10 DIAGNOSIS — M25549 Pain in joints of unspecified hand: Secondary | ICD-10-CM | POA: Diagnosis not present

## 2011-04-10 DIAGNOSIS — M25529 Pain in unspecified elbow: Secondary | ICD-10-CM | POA: Diagnosis not present

## 2011-04-10 DIAGNOSIS — M25569 Pain in unspecified knee: Secondary | ICD-10-CM | POA: Diagnosis not present

## 2011-04-11 DIAGNOSIS — R300 Dysuria: Secondary | ICD-10-CM | POA: Diagnosis not present

## 2011-04-11 DIAGNOSIS — Z79899 Other long term (current) drug therapy: Secondary | ICD-10-CM | POA: Diagnosis not present

## 2011-04-11 DIAGNOSIS — M2550 Pain in unspecified joint: Secondary | ICD-10-CM | POA: Diagnosis not present

## 2011-04-11 DIAGNOSIS — R5381 Other malaise: Secondary | ICD-10-CM | POA: Diagnosis not present

## 2011-04-16 ENCOUNTER — Ambulatory Visit: Payer: Medicare Other | Admitting: Internal Medicine

## 2011-04-16 ENCOUNTER — Telehealth: Payer: Self-pay | Admitting: Internal Medicine

## 2011-04-16 NOTE — Telephone Encounter (Signed)
Received copies from [facility] 1.16.13. Forwarded 1 pages to Dr. Felicity Coyer ,for review.  sj

## 2011-04-30 ENCOUNTER — Encounter: Payer: Self-pay | Admitting: Internal Medicine

## 2011-04-30 ENCOUNTER — Ambulatory Visit (INDEPENDENT_AMBULATORY_CARE_PROVIDER_SITE_OTHER): Payer: Medicare Other | Admitting: Internal Medicine

## 2011-04-30 VITALS — BP 122/52 | HR 64 | Temp 97.0°F | Wt 177.4 lb

## 2011-04-30 DIAGNOSIS — I10 Essential (primary) hypertension: Secondary | ICD-10-CM

## 2011-04-30 DIAGNOSIS — E039 Hypothyroidism, unspecified: Secondary | ICD-10-CM | POA: Diagnosis not present

## 2011-04-30 MED ORDER — LEVOTHYROXINE SODIUM 150 MCG PO TABS: 150.0000 ug | ORAL_TABLET | Freq: Every day | ORAL | Status: DC

## 2011-04-30 MED ORDER — LEVOTHYROXINE SODIUM 137 MCG PO TABS: 137.0000 ug | ORAL_TABLET | Freq: Every day | ORAL | Status: DC

## 2011-04-30 NOTE — Assessment & Plan Note (Signed)
BP Readings from Last 3 Encounters:  04/30/11 122/52  09/25/10 122/60  09/09/10 130/62   The current medical regimen is effective;  continue present plan and medications.

## 2011-04-30 NOTE — Progress Notes (Signed)
  Subjective:    Patient ID: Brittney Gates, female    DOB: 04-Mar-1925, 78 y.o.   MRN: [id number]  HPI  Here for follow up - reviewed chronic medical issues:   HTN - reports compliance with ongoing medical treatment and no changes in medication dose or frequency. denies adverse side effects related to current therapy.   dyslipidemia - on zetia, no statin - reports compliance with ongoing medical treatment and no changes in medication dose or frequency. denies adverse side effects related to current therapy.   hypothyroid -  Reports recent labs at rheum with "high TSH" -reports compliance with ongoing medical treatment and no changes in medication dose or frequency. denies adverse side effects related to current therapy.   anxiety - manifest with insomnia and rumination thoughs at bedtime - use soma or Ambien as needed to control same   Past Medical History  Diagnosis Date  . DIABETES MELLITUS, BORDERLINE   . LOW BACK PAIN, CHRONIC   . Diverticulosis   . ANEMIA   . ANXIETY   . Arthritis   . HYPERTENSION   . HYPERLIPIDEMIA   . HYPOTHYROIDISM      Review of Systems  Constitutional: Positive for fatigue. Negative for fever and appetite change.  HENT: Positive for hearing loss and ear pain (shooting sharp L ear pain x 2 events, lasts seconds - no pain now). Negative for ear discharge.   Respiratory: Negative for cough and shortness of breath.   Cardiovascular: Negative for chest pain and palpitations.  Neurological: Negative for dizziness, syncope, facial asymmetry and headaches.  Psychiatric/Behavioral: The patient is nervous/anxious.        Objective:   Physical Exam  BP 122/52  Pulse 64  Temp(Src) 97 F (36.1 C) (Oral)  Wt 177 lb 6.4 oz (80.468 kg)  SpO2 97% Wt Readings from Last 3 Encounters:  04/30/11 177 lb 6.4 oz (80.468 kg)  09/09/10 179 lb 12.8 oz (81.557 kg)  04/24/10 177 lb (80.287 kg)   Constitutional: She is large framed older woman;  appears well-developed  and well-nourished. No distress. Son [name] is at side HENT: NCAT, nontender sinuses. B TMs clear without erythema or effusion. OP clear Eyes: Conjunctivae and EOM are normal. Pupils are equal, round, and reactive to light. No scleral icterus.  Neck: Normal range of motion. Neck supple. No JVD present. No thyromegaly present.  Cardiovascular: Normal rate, regular rhythm and normal heart sounds.  No murmur heard. No BLE edema. Pulmonary/Chest: Effort normal and breath sounds normal. No respiratory distress. She has no wheezes.  Neurologic: AAOx4, CN 2-12 symmetrically intact - speech fluent - no dysarthria - follows commands, good recall and normal gait/balance Psychiatric: She has a normal mood and affect. Her behavior is normal. Judgment and thought content normal.   Lab Results  Component Value Date   WBC 9.7 03/29/2010   HGB 13.6 02/01/2011   HCT 40.0 02/01/2011   PLT 292 03/29/2010   CHOL 231* 09/09/2010   TRIG 176.0* 09/09/2010   HDL 53.90 09/09/2010   LDLDIRECT 153.3 09/09/2010   ALT 19 03/29/2010   AST 20 03/29/2010   NA 139 02/01/2011   K 4.1 02/01/2011   CL 105 02/01/2011   CREATININE 1.10 02/01/2011   BUN 27* 02/01/2011   CO2 23 03/29/2010   TSH 0.99 09/09/2010   HGBA1C 6.0 03/29/2010        Assessment & Plan:  See problem list. Medications and labs reviewed today.

## 2011-04-30 NOTE — Assessment & Plan Note (Signed)
Reports increase TSH 2 weeks ago at rheum - look for value and increase replacement dose now Recheck TSH in 6-12 weeks Pt/son understand and agree to same   Lab Results  Component Value Date   TSH 0.99 09/09/2010

## 2011-04-30 NOTE — Patient Instructions (Addendum)
It was good to see you today. Increase your synthroid to daily - Your prescription(s) have been submitted to your pharmacy. Please take as directed and contact our office if you believe you are having problem(s) with the medication(s). Will need recheck TSH in 6-12 weeks - call here to schedule lab if not done by Rheumatology Please schedule followup in 6 months, call sooner if problems.

## 2011-05-09 DIAGNOSIS — M25529 Pain in unspecified elbow: Secondary | ICD-10-CM | POA: Diagnosis not present

## 2011-05-09 DIAGNOSIS — M25569 Pain in unspecified knee: Secondary | ICD-10-CM | POA: Diagnosis not present

## 2011-05-09 DIAGNOSIS — M545 Low back pain, unspecified: Secondary | ICD-10-CM | POA: Diagnosis not present

## 2011-05-09 DIAGNOSIS — M25549 Pain in joints of unspecified hand: Secondary | ICD-10-CM | POA: Diagnosis not present

## 2011-05-13 ENCOUNTER — Other Ambulatory Visit: Payer: Self-pay | Admitting: Internal Medicine

## 2011-05-19 DIAGNOSIS — G5760 Lesion of plantar nerve, unspecified lower limb: Secondary | ICD-10-CM | POA: Diagnosis not present

## 2011-05-26 ENCOUNTER — Telehealth: Payer: Self-pay | Admitting: Internal Medicine

## 2011-05-26 DIAGNOSIS — N644 Mastodynia: Secondary | ICD-10-CM

## 2011-05-26 NOTE — Telephone Encounter (Signed)
Called pt spoke with husband md ok referral will received call back from [facility] with appt, date and time... 05/26/11@3 :44pm/LMB

## 2011-05-26 NOTE — Telephone Encounter (Signed)
ok 

## 2011-05-26 NOTE — Telephone Encounter (Signed)
The pt called and is hoping for a diagnostic mammogram due to left breast pain.  The pt states the pain has been going on for one week.   Thanks!

## 2011-05-30 DIAGNOSIS — J00 Acute nasopharyngitis [common cold]: Secondary | ICD-10-CM | POA: Diagnosis not present

## 2011-05-31 ENCOUNTER — Other Ambulatory Visit: Payer: Self-pay | Admitting: Internal Medicine

## 2011-06-02 ENCOUNTER — Ambulatory Visit
Admission: RE | Admit: 2011-06-02 | Discharge: 2011-06-02 | Disposition: A | Discharge status: Home / Self Care | Payer: Medicare Other | Source: Ambulatory Visit | Attending: Internal Medicine | Admitting: Internal Medicine

## 2011-06-02 DIAGNOSIS — N644 Mastodynia: Secondary | ICD-10-CM

## 2011-06-13 DIAGNOSIS — M810 Age-related osteoporosis without current pathological fracture: Secondary | ICD-10-CM | POA: Diagnosis not present

## 2011-06-16 ENCOUNTER — Other Ambulatory Visit: Payer: Self-pay | Admitting: *Deleted

## 2011-06-16 MED ORDER — ZOLPIDEM TARTRATE 10 MG PO TABS: 10.0000 mg | ORAL_TABLET | Freq: Every evening | ORAL | Status: DC | PRN

## 2011-06-16 NOTE — Telephone Encounter (Signed)
Faxed script back to [facility]... 06/16/11@10 :03am/LMB

## 2011-08-19 ENCOUNTER — Other Ambulatory Visit: Payer: Self-pay | Admitting: *Deleted

## 2011-08-19 MED ORDER — AMLODIPINE BESY-BENAZEPRIL HCL 5-20 MG PO CAPS: 1.0000 | ORAL_CAPSULE | Freq: Every day | ORAL | Status: DC

## 2011-09-12 ENCOUNTER — Other Ambulatory Visit: Payer: Self-pay | Admitting: Internal Medicine

## 2011-09-12 NOTE — Telephone Encounter (Signed)
md out of office. Is this ok to refill?... 09/12/11@11 :36am/LMB

## 2011-09-12 NOTE — Telephone Encounter (Signed)
Faxed script bck to [facility]... 09/12/11@1 :18pm/LMB

## 2011-10-09 ENCOUNTER — Other Ambulatory Visit: Payer: Self-pay | Admitting: Internal Medicine

## 2011-10-22 DIAGNOSIS — R3911 Hesitancy of micturition: Secondary | ICD-10-CM | POA: Diagnosis not present

## 2011-10-27 ENCOUNTER — Ambulatory Visit (INDEPENDENT_AMBULATORY_CARE_PROVIDER_SITE_OTHER): Payer: Medicare Other | Admitting: Internal Medicine

## 2011-10-27 ENCOUNTER — Encounter: Payer: Self-pay | Admitting: Internal Medicine

## 2011-10-27 VITALS — BP 120/60 | HR 74 | Temp 97.9°F | Ht 68.0 in | Wt 178.0 lb

## 2011-10-27 DIAGNOSIS — M545 Low back pain, unspecified: Secondary | ICD-10-CM

## 2011-10-27 DIAGNOSIS — I10 Essential (primary) hypertension: Secondary | ICD-10-CM | POA: Diagnosis not present

## 2011-10-27 DIAGNOSIS — M79604 Pain in right leg: Secondary | ICD-10-CM

## 2011-10-27 DIAGNOSIS — E039 Hypothyroidism, unspecified: Secondary | ICD-10-CM

## 2011-10-27 DIAGNOSIS — M79609 Pain in unspecified limb: Secondary | ICD-10-CM | POA: Diagnosis not present

## 2011-10-27 DIAGNOSIS — M79605 Pain in left leg: Secondary | ICD-10-CM

## 2011-10-27 DIAGNOSIS — E785 Hyperlipidemia, unspecified: Secondary | ICD-10-CM

## 2011-10-27 DIAGNOSIS — I739 Peripheral vascular disease, unspecified: Secondary | ICD-10-CM

## 2011-10-27 NOTE — Assessment & Plan Note (Signed)
Multiple prior interventions including fusion L4-5 "bad back" with advanced DDD and multilevel spondylosis (last MRI in our system 11/2007), other follow up at [facility] with Dr Wyline Mood since then Check for PVD as above If no PAD, refer back to physiatry Dr Ezzard Standing to consider injections to help pain

## 2011-10-27 NOTE — Assessment & Plan Note (Signed)
On zetia, no statin The current medical regimen is effective;  continue present plan and medications. Check flp - adjust as needed 

## 2011-10-27 NOTE — Progress Notes (Signed)
Subjective:    Patient ID: Brittney Gates, female    DOB: 1924-10-06, 78 y.o.   MRN: [id number]  HPI  Here for follow up - reviewed chronic medical issues:   HTN - reports compliance with ongoing medical treatment and no changes in medication dose or frequency. denies adverse side effects related to current therapy.   dyslipidemia - on zetia, no statin - reports compliance with ongoing medical treatment and no changes in medication dose or frequency. denies adverse side effects related to current therapy.   hypothyroid - reports compliance with ongoing medical treatment and no changes in medication dose or frequency. denies adverse side effects related to current therapy.   anxiety - manifest with insomnia and rumination thoughs at bedtime - use soma or Ambien as needed to control same   Past Medical History  Diagnosis Date  . DIABETES MELLITUS, BORDERLINE   . LOW BACK PAIN, CHRONIC   . Diverticulosis   . ANEMIA   . ANXIETY   . Arthritis   . HYPERTENSION   . HYPERLIPIDEMIA   . HYPOTHYROIDISM      Review of Systems  Constitutional: Positive for fatigue. Negative for fever and appetite change.  Respiratory: Negative for cough and shortness of breath.   Cardiovascular: Negative for chest pain and palpitations.  Musculoskeletal: Positive for back pain and gait problem. Negative for joint swelling.       Leg pain/numbness R distal>L proximal  Neurological: Negative for dizziness, syncope, facial asymmetry and headaches.  Psychiatric/Behavioral: The patient is nervous/anxious.        Objective:   Physical Exam  BP 120/60  Pulse 74  Temp 97.9 F (36.6 C) (Oral)  Ht 5\' 8"  (1.727 m)  Wt 178 lb (80.74 kg)  BMI 27.06 kg/m2  SpO2 97% Wt Readings from Last 3 Encounters:  10/27/11 178 lb (80.74 kg)  04/30/11 177 lb 6.4 oz (80.468 kg)  09/09/10 179 lb 12.8 oz (81.557 kg)   Constitutional: She is large framed older woman;  appears well-developed and well-nourished. No distress.  Son [name] is at side Cardiovascular: Normal rate, regular rhythm and normal heart sounds.  No murmur heard. No BLE edema. Pulmonary/Chest: Effort normal and breath sounds normal. No respiratory distress. She has no wheezes.  Mskel: Back: full range of motion of thoracic and lumbar spine. Non tender to palpation. Negative straight leg raise. DTR's are symmetrically intact. Sensation intact in all dermatomes of the lower extremities. Full strength to manual muscle testing. patient is able to heel toe walk without difficulty and ambulates with slightly antalgic gait. Neurologic: AAOx4, CN 2-12 symmetrically intact - speech fluent - no dysarthria - follows commands, good recall and normal gait/balance Psychiatric: She has a slightly anxious mood and affect. Her behavior is normal. Judgment and thought content normal.   Lab Results  Component Value Date   WBC 9.7 03/29/2010   HGB 13.6 02/01/2011   HCT 40.0 02/01/2011   PLT 292 03/29/2010   CHOL 231* 09/09/2010   TRIG 176.0* 09/09/2010   HDL 53.90 09/09/2010   LDLDIRECT 153.3 09/09/2010   ALT 19 03/29/2010   AST 20 03/29/2010   NA 139 02/01/2011   K 4.1 02/01/2011   CL 105 02/01/2011   CREATININE 1.10 02/01/2011   BUN 27* 02/01/2011   CO2 23 03/29/2010   TSH 0.99 09/09/2010   HGBA1C 6.0 03/29/2010        Assessment & Plan:  See problem list. Medications and labs reviewed today.  BLE pain & "  numbness" - suspect related to referred radicular lumbar and spinal stenosis pain -  Will check ABI rule out PAD given risk factors for same If no PAD, refer to physiatry Dr Ezzard Standing to consider repeat injections as needed (NSurg Dr Wyline Mood "refuses" surgery unless unable to walk per pt/son)

## 2011-10-27 NOTE — Patient Instructions (Signed)
It was good to see you today. Test(s) ordered today - return this week when you are fasting. Your results will be called to you after review (48-72hours after test completion). If any changes need to be made, you will be notified at that time. we'll make referral to ABI to look at leg blood flow . Our office will contact you regarding appointment(s) once made. Please schedule followup in 6 months, call sooner if problems.

## 2011-10-27 NOTE — Assessment & Plan Note (Signed)
Check TSH and adjust as needed Pt/son understand and agree to same  Lab Results  Component Value Date   TSH 0.99 09/09/2010

## 2011-10-27 NOTE — Assessment & Plan Note (Signed)
BP Readings from Last 3 Encounters:  10/27/11 120/60  04/30/11 122/52  09/25/10 122/60   The current medical regimen is effective;  continue present plan and medications.

## 2011-10-29 ENCOUNTER — Ambulatory Visit: Payer: Medicare Other | Admitting: Internal Medicine

## 2011-10-31 ENCOUNTER — Encounter (INDEPENDENT_AMBULATORY_CARE_PROVIDER_SITE_OTHER): Payer: Medicare Other

## 2011-10-31 ENCOUNTER — Other Ambulatory Visit: Payer: Self-pay | Admitting: *Deleted

## 2011-10-31 DIAGNOSIS — M79609 Pain in unspecified limb: Secondary | ICD-10-CM

## 2011-10-31 DIAGNOSIS — I739 Peripheral vascular disease, unspecified: Secondary | ICD-10-CM | POA: Diagnosis not present

## 2011-10-31 DIAGNOSIS — M79604 Pain in right leg: Secondary | ICD-10-CM

## 2011-11-04 ENCOUNTER — Other Ambulatory Visit (INDEPENDENT_AMBULATORY_CARE_PROVIDER_SITE_OTHER): Payer: Medicare Other

## 2011-11-04 DIAGNOSIS — E039 Hypothyroidism, unspecified: Secondary | ICD-10-CM

## 2011-11-04 DIAGNOSIS — I10 Essential (primary) hypertension: Secondary | ICD-10-CM | POA: Diagnosis not present

## 2011-11-04 DIAGNOSIS — I739 Peripheral vascular disease, unspecified: Secondary | ICD-10-CM | POA: Insufficient documentation

## 2011-11-04 DIAGNOSIS — E785 Hyperlipidemia, unspecified: Secondary | ICD-10-CM

## 2011-11-04 LAB — LIPID PANEL
Cholesterol: 184 mg/dL (ref 0–200)
HDL: 51.3 mg/dL (ref >39.00)
LDL Cholesterol: 114 mg/dL — ABNORMAL HIGH (ref 0–99)
Total CHOL/HDL Ratio: 4
Triglycerides: 96 mg/dL (ref 0.0–149.0)
VLDL: 19.2 mg/dL (ref 0.0–40.0)

## 2011-11-04 LAB — TSH: TSH: 2.44 u[IU]/mL (ref 0.35–5.50)

## 2011-11-04 NOTE — Addendum Note (Signed)
Addended by: Rene Paci A on: 11/04/2011 03:04 PM   Modules accepted: Orders

## 2011-11-12 ENCOUNTER — Institutional Professional Consult (permissible substitution): Payer: Medicare Other | Admitting: Cardiovascular Disease

## 2011-11-13 ENCOUNTER — Other Ambulatory Visit: Payer: Self-pay | Admitting: Internal Medicine

## 2011-11-25 DIAGNOSIS — M24676 Ankylosis, unspecified foot: Secondary | ICD-10-CM | POA: Diagnosis not present

## 2011-11-25 DIAGNOSIS — M25579 Pain in unspecified ankle and joints of unspecified foot: Secondary | ICD-10-CM | POA: Diagnosis not present

## 2011-11-25 DIAGNOSIS — IMO0002 Reserved for concepts with insufficient information to code with codable children: Secondary | ICD-10-CM | POA: Diagnosis not present

## 2011-12-04 DIAGNOSIS — Z23 Encounter for immunization: Secondary | ICD-10-CM | POA: Diagnosis not present

## 2011-12-05 ENCOUNTER — Encounter: Payer: Self-pay | Admitting: Internal Medicine

## 2011-12-11 ENCOUNTER — Ambulatory Visit (INDEPENDENT_AMBULATORY_CARE_PROVIDER_SITE_OTHER): Payer: Medicare Other | Admitting: Cardiovascular Disease

## 2011-12-11 ENCOUNTER — Other Ambulatory Visit: Payer: Self-pay | Admitting: *Deleted

## 2011-12-11 ENCOUNTER — Encounter: Payer: Self-pay | Admitting: Cardiovascular Disease

## 2011-12-11 VITALS — BP 144/70 | HR 57 | Ht 68.0 in | Wt 175.0 lb

## 2011-12-11 DIAGNOSIS — I739 Peripheral vascular disease, unspecified: Secondary | ICD-10-CM

## 2011-12-11 LAB — BASIC METABOLIC PANEL
BUN: 19 mg/dL (ref 6–23)
CO2: 26 mEq/L (ref 19–32)
Calcium: 9.6 mg/dL (ref 8.4–10.5)
Chloride: 102 mEq/L (ref 96–112)
Creatinine, Ser: 1 mg/dL (ref 0.4–1.2)
GFR: 55.98 mL/min — ABNORMAL LOW (ref >60.00)
Glucose, Bld: 95 mg/dL (ref 70–99)
Potassium: 4.4 mEq/L (ref 3.5–5.1)
Sodium: 137 mEq/L (ref 135–145)

## 2011-12-11 MED ORDER — ZOLPIDEM TARTRATE 10 MG PO TABS: 10.0000 mg | ORAL_TABLET | Freq: Every evening | ORAL | Status: DC | PRN

## 2011-12-11 NOTE — Telephone Encounter (Signed)
R'cd fax from [facility] for refill of Zolpidem-last written 09/12/2011 #30 with 2 refills-please advise.

## 2011-12-11 NOTE — Progress Notes (Signed)
HPI:  78 year old woman presenting for followup evaluation. She has a history of chest pain, palpitations, and white coat hypertension. She had a nuclear stress test in January 2012 demonstrating normal myocardial perfusion and 8 gated left ventricular ejection fraction of 57%. Recent lipids showed a cholesterol of 184, trig authorize 96, HDL 51, and LDL 114.  The patient has a long history of low back problems. However, more recently she has developed progressive symptoms involving her left leg. She has some numbness and pain down the leg, but also complains of a "heaviness" with walking. The pain is in the thigh and the calf area. She has not had rest pain or ulceration. She underwent a lower extremity duplex scan with ABIs. Her right ABI was normal and there was nonobstructive disease noted on vascular imaging. The left ABI was moderately reduced at 0.66, but there were no high-grade stenoses identified by imaging. Her Doppler waveforms were dampened at the level of the ankles.  The patient has occasional episodes of chest pressure. This is unrelated to exertion. She's had the same symptoms now for some time and there is no change in frequency or severity of the pain. She denies dyspnea with exertion. Her main complaint relates to her walking as she is having more and more difficulty.  Outpatient Encounter Prescriptions as of 12/11/2011  Medication Sig Dispense Refill  . amLODipine-benazepril (LOTREL) 5-20 MG per capsule Take 1 capsule by mouth daily.  30 capsule  5  . aspirin 81 MG tablet Take 81 mg by mouth daily.        . methocarbamol (ROBAXIN) 500 MG tablet AS NEEDED      . Multiple Vitamin (MULTIVITAMIN) tablet Take 1 tablet by mouth daily.        . naproxen (NAPROSYN) 500 MG tablet Take 1 by mouth daily as needed      . SYNTHROID 150 MCG tablet TAKE ONE TABLET BY MOUTH DAILY  30 tablet  5  . VITAMIN D, CHOLECALCIFEROL, PO Take 2,400 Units by mouth daily.      Marland Kitchen ZETIA 10 MG tablet TAKE ONE  TABLET BY MOUTH DAILY.  30 tablet  5  . zolpidem (AMBIEN) 10 MG tablet TAKE ONE TABLET DAILY AT BEDTIME AS NEEDED FOR SLEEP  30 tablet  1    No Known Allergies  Past Medical History  Diagnosis Date  . DIABETES MELLITUS, BORDERLINE   . LOW BACK PAIN, CHRONIC   . Diverticulosis   . ANEMIA   . ANXIETY   . Arthritis   . HYPERTENSION   . HYPERLIPIDEMIA   . HYPOTHYROIDISM     ROS: Negative except as per HPI  BP 144/70  Pulse 57  Ht 5\' 8"  (1.727 m)  Wt 79.379 kg (175 lb)  BMI 26.61 kg/m2  PHYSICAL EXAM: Pt is alert and oriented, NAD HEENT: normal Neck: JVP - normal, carotids 2+= without bruits Lungs: CTA bilaterally CV: RRR without murmur or gallop Abd: soft, NT, Positive BS, no hepatomegaly Ext: no C/C/E, femoral pulses are 2+ and equal bilaterally. Posterior tibial pulse on the right is 2+, DP is 1+, on the left the pulses are nonpalpable. Skin: warm/dry no rash  EKG:  Sinus bradycardia 57 beats per minute, nonspecific IVCD.  ASSESSMENT AND PLAN: 1. Lower extremity peripheral arterial disease with intermittent claudication. The patient has moderate reduction in her ABI on the left and she has progressive symptoms with walking. I am not sure if her pain is related to arterial disease, referred  low back pain, or a combination of both. I think the most prudent thing to do is to image her with a CT angiogram of her aortoiliac region with runoff bilaterally. This will allow [place] to see if there are any focal areas that can be successfully treated with stenting. The patient is on appropriate medical therapy with aspirin. She is statin intolerant but takes ezetimibe daily.  2. Chest pain. Evaluation last year with abnormal nuclear scan. No further testing is warranted. I suspect this is noncardiac.  3. Hypertension. Considering her age, blood pressure control is good. She will remain on amlodipine and benazepril.  Tonny Bollman 12/11/2011 1:45 PM

## 2011-12-11 NOTE — Patient Instructions (Addendum)
Non-Cardiac CT Angiography (CTA), is a special type of CT scan that uses a computer to produce multi-dimensional views of major blood vessels throughout the body. In CT angiography, a contrast material is injected through an IV to help visualize the blood vessels (CTA Abdominal Aorta with runoff)  Your physician wants you to follow-up in: 1 YEAR with Dr Excell Seltzer.  You will receive a reminder letter in the mail two months in advance. If you don't receive a letter, please call our office to schedule the follow-up appointment.  Your physician recommends that you continue on your current medications as directed. Please refer to the Current Medication list given to you today.  Your physician recommends that you have lab work today: BMP

## 2011-12-11 NOTE — Telephone Encounter (Signed)
ok 

## 2011-12-12 MED ORDER — ZOLPIDEM TARTRATE 10 MG PO TABS: 10.0000 mg | ORAL_TABLET | Freq: Every evening | ORAL | Status: DC | PRN

## 2011-12-12 NOTE — Telephone Encounter (Signed)
Rx faxed to [facility].  

## 2011-12-18 ENCOUNTER — Other Ambulatory Visit: Payer: Medicare Other

## 2011-12-19 ENCOUNTER — Ambulatory Visit: Payer: Medicare Other | Admitting: Internal Medicine

## 2011-12-25 ENCOUNTER — Ambulatory Visit (INDEPENDENT_AMBULATORY_CARE_PROVIDER_SITE_OTHER)
Admission: RE | Admit: 2011-12-25 | Discharge: 2011-12-25 | Disposition: A | Discharge status: Home / Self Care | Payer: Medicare Other | Source: Ambulatory Visit | Attending: Cardiovascular Disease | Admitting: Cardiovascular Disease

## 2011-12-25 DIAGNOSIS — I739 Peripheral vascular disease, unspecified: Secondary | ICD-10-CM

## 2011-12-25 DIAGNOSIS — I771 Stricture of artery: Secondary | ICD-10-CM | POA: Diagnosis not present

## 2011-12-25 MED ORDER — IOHEXOL 350 MG/ML SOLN
125.0000 mL | Freq: Once | INTRAVENOUS | Status: AC | PRN
  Administered 2011-12-25: 125 mL via INTRAVENOUS

## 2012-01-20 ENCOUNTER — Emergency Department (HOSPITAL_COMMUNITY): Payer: Medicare Other

## 2012-01-20 ENCOUNTER — Encounter (HOSPITAL_COMMUNITY): Payer: Self-pay | Admitting: *Deleted

## 2012-01-20 ENCOUNTER — Inpatient Hospital Stay (HOSPITAL_COMMUNITY)
Admission: EM | Admit: 2012-01-20 | Discharge: 2012-01-23 | DRG: 481 | Disposition: A | Discharge status: Skilled Nursing Facility | Payer: Medicare Other | Attending: Emergency Medicine | Admitting: Emergency Medicine

## 2012-01-20 DIAGNOSIS — Y92009 Unspecified place in unspecified non-institutional (private) residence as the place of occurrence of the external cause: Secondary | ICD-10-CM | POA: Diagnosis not present

## 2012-01-20 DIAGNOSIS — M25559 Pain in unspecified hip: Secondary | ICD-10-CM | POA: Diagnosis not present

## 2012-01-20 DIAGNOSIS — N390 Urinary tract infection, site not specified: Secondary | ICD-10-CM | POA: Diagnosis present

## 2012-01-20 DIAGNOSIS — E039 Hypothyroidism, unspecified: Secondary | ICD-10-CM | POA: Diagnosis present

## 2012-01-20 DIAGNOSIS — R51 Headache: Secondary | ICD-10-CM | POA: Diagnosis not present

## 2012-01-20 DIAGNOSIS — M545 Low back pain, unspecified: Secondary | ICD-10-CM | POA: Diagnosis present

## 2012-01-20 DIAGNOSIS — R7309 Other abnormal glucose: Secondary | ICD-10-CM | POA: Diagnosis present

## 2012-01-20 DIAGNOSIS — D72829 Elevated white blood cell count, unspecified: Secondary | ICD-10-CM | POA: Diagnosis present

## 2012-01-20 DIAGNOSIS — Z9181 History of falling: Secondary | ICD-10-CM | POA: Diagnosis not present

## 2012-01-20 DIAGNOSIS — D649 Anemia, unspecified: Secondary | ICD-10-CM | POA: Diagnosis present

## 2012-01-20 DIAGNOSIS — F411 Generalized anxiety disorder: Secondary | ICD-10-CM | POA: Diagnosis not present

## 2012-01-20 DIAGNOSIS — R52 Pain, unspecified: Secondary | ICD-10-CM | POA: Diagnosis not present

## 2012-01-20 DIAGNOSIS — I10 Essential (primary) hypertension: Secondary | ICD-10-CM | POA: Diagnosis not present

## 2012-01-20 DIAGNOSIS — R079 Chest pain, unspecified: Secondary | ICD-10-CM | POA: Diagnosis not present

## 2012-01-20 DIAGNOSIS — S72009A Fracture of unspecified part of neck of unspecified femur, initial encounter for closed fracture: Secondary | ICD-10-CM | POA: Diagnosis not present

## 2012-01-20 DIAGNOSIS — M129 Arthropathy, unspecified: Secondary | ICD-10-CM | POA: Diagnosis present

## 2012-01-20 DIAGNOSIS — S72143A Displaced intertrochanteric fracture of unspecified femur, initial encounter for closed fracture: Secondary | ICD-10-CM | POA: Diagnosis not present

## 2012-01-20 DIAGNOSIS — Z043 Encounter for examination and observation following other accident: Secondary | ICD-10-CM | POA: Diagnosis not present

## 2012-01-20 DIAGNOSIS — A498 Other bacterial infections of unspecified site: Secondary | ICD-10-CM | POA: Diagnosis present

## 2012-01-20 DIAGNOSIS — Z7982 Long term (current) use of aspirin: Secondary | ICD-10-CM | POA: Diagnosis not present

## 2012-01-20 DIAGNOSIS — W19XXXA Unspecified fall, initial encounter: Secondary | ICD-10-CM | POA: Diagnosis not present

## 2012-01-20 DIAGNOSIS — Z79899 Other long term (current) drug therapy: Secondary | ICD-10-CM

## 2012-01-20 DIAGNOSIS — I739 Peripheral vascular disease, unspecified: Secondary | ICD-10-CM

## 2012-01-20 DIAGNOSIS — S72009D Fracture of unspecified part of neck of unspecified femur, subsequent encounter for closed fracture with routine healing: Secondary | ICD-10-CM | POA: Diagnosis not present

## 2012-01-20 DIAGNOSIS — G8929 Other chronic pain: Secondary | ICD-10-CM | POA: Diagnosis present

## 2012-01-20 DIAGNOSIS — R42 Dizziness and giddiness: Secondary | ICD-10-CM | POA: Diagnosis not present

## 2012-01-20 DIAGNOSIS — E785 Hyperlipidemia, unspecified: Secondary | ICD-10-CM | POA: Diagnosis not present

## 2012-01-20 DIAGNOSIS — Z01810 Encounter for preprocedural cardiovascular examination: Secondary | ICD-10-CM

## 2012-01-20 DIAGNOSIS — IMO0002 Reserved for concepts with insufficient information to code with codable children: Secondary | ICD-10-CM | POA: Diagnosis not present

## 2012-01-20 DIAGNOSIS — Y9301 Activity, walking, marching and hiking: Secondary | ICD-10-CM

## 2012-01-20 DIAGNOSIS — I517 Cardiomegaly: Secondary | ICD-10-CM | POA: Diagnosis not present

## 2012-01-20 DIAGNOSIS — Z5189 Encounter for other specified aftercare: Secondary | ICD-10-CM | POA: Diagnosis not present

## 2012-01-20 DIAGNOSIS — S72142A Displaced intertrochanteric fracture of left femur, initial encounter for closed fracture: Secondary | ICD-10-CM

## 2012-01-20 DIAGNOSIS — M25859 Other specified joint disorders, unspecified hip: Secondary | ICD-10-CM | POA: Diagnosis not present

## 2012-01-20 DIAGNOSIS — E119 Type 2 diabetes mellitus without complications: Secondary | ICD-10-CM | POA: Diagnosis not present

## 2012-01-20 HISTORY — DX: Peripheral vascular disease, unspecified: I73.9

## 2012-01-20 LAB — CBC WITH DIFFERENTIAL/PLATELET
Basophils Absolute: 0.1 10*3/uL (ref 0.0–0.1)
Basophils Relative: 0 % (ref 0–1)
Eosinophils Absolute: 0.1 10*3/uL (ref 0.0–0.7)
Eosinophils Relative: 1 % (ref 0–5)
HCT: 31.5 % — ABNORMAL LOW (ref 36.0–46.0)
Hemoglobin: 10.8 g/dL — ABNORMAL LOW (ref 12.0–15.0)
Lymphocytes Relative: 15 % (ref 12–46)
Lymphs Abs: 2.3 10*3/uL (ref 0.7–4.0)
MCH: 33.4 pg (ref 26.0–34.0)
MCHC: 34.3 g/dL (ref 30.0–36.0)
MCV: 97.5 fL (ref 78.0–100.0)
Monocytes Absolute: 0.7 10*3/uL (ref 0.1–1.0)
Monocytes Relative: 5 % (ref 3–12)
Neutro Abs: 12.1 10*3/uL — ABNORMAL HIGH (ref 1.7–7.7)
Neutrophils Relative %: 80 % — ABNORMAL HIGH (ref 43–77)
Platelets: 197 10*3/uL (ref 150–400)
RBC: 3.23 MIL/uL — ABNORMAL LOW (ref 3.87–5.11)
RDW: 18 % — ABNORMAL HIGH (ref 11.5–15.5)
WBC: 15.2 10*3/uL — ABNORMAL HIGH (ref 4.0–10.5)

## 2012-01-20 LAB — BASIC METABOLIC PANEL
BUN: 21 mg/dL (ref 6–23)
CO2: 23 mEq/L (ref 19–32)
Calcium: 9 mg/dL (ref 8.4–10.5)
Chloride: 101 mEq/L (ref 96–112)
Creatinine, Ser: 0.89 mg/dL (ref 0.50–1.10)
GFR calc Af Amer: 67 mL/min — ABNORMAL LOW (ref >90)
GFR calc non Af Amer: 57 mL/min — ABNORMAL LOW (ref >90)
Glucose, Bld: 124 mg/dL — ABNORMAL HIGH (ref 70–99)
Potassium: 3.7 mEq/L (ref 3.5–5.1)
Sodium: 135 mEq/L (ref 135–145)

## 2012-01-20 MED ORDER — MORPHINE SULFATE 2 MG/ML IJ SOLN
1.0000 mg | INTRAMUSCULAR | Status: DC | PRN
  Administered 2012-01-21 – 2012-01-22 (×6): 1 mg via INTRAVENOUS
  Filled 2012-01-20 (×6): qty 1

## 2012-01-20 MED ORDER — POTASSIUM CHLORIDE IN NACL 20-0.9 MEQ/L-% IV SOLN
INTRAVENOUS | Status: AC
  Administered 2012-01-21: 03:00:00 via INTRAVENOUS
  Filled 2012-01-20: qty 1000

## 2012-01-20 MED ORDER — POLYETHYL GLYCOL-PROPYL GLYCOL 0.4-0.3 % OP SOLN: 1.0000 [drp] | Freq: Three times a day (TID) | OPHTHALMIC | Status: DC | PRN

## 2012-01-20 MED ORDER — LORAZEPAM 2 MG/ML IJ SOLN: 1.0000 mg | Freq: Four times a day (QID) | INTRAMUSCULAR | Status: DC | PRN

## 2012-01-20 MED ORDER — ONDANSETRON HCL 4 MG/2ML IJ SOLN
4.0000 mg | Freq: Once | INTRAMUSCULAR | Status: AC
  Administered 2012-01-20: 4 mg via INTRAVENOUS
  Filled 2012-01-20: qty 2

## 2012-01-20 MED ORDER — FENTANYL CITRATE 0.05 MG/ML IJ SOLN
50.0000 ug | Freq: Once | INTRAMUSCULAR | Status: AC
  Administered 2012-01-20: 50 ug via INTRAVENOUS
  Filled 2012-01-20: qty 2

## 2012-01-20 MED ORDER — ONDANSETRON HCL 4 MG PO TABS: 4.0000 mg | ORAL_TABLET | Freq: Four times a day (QID) | ORAL | Status: DC | PRN

## 2012-01-20 MED ORDER — ONDANSETRON HCL 4 MG/2ML IJ SOLN
4.0000 mg | Freq: Once | INTRAMUSCULAR | Status: AC
  Administered 2012-01-20: 4 mg via INTRAVENOUS

## 2012-01-20 MED ORDER — ONDANSETRON HCL 4 MG/2ML IJ SOLN
4.0000 mg | Freq: Four times a day (QID) | INTRAMUSCULAR | Status: DC | PRN
  Filled 2012-01-20: qty 2

## 2012-01-20 MED ORDER — LEVOTHYROXINE SODIUM 125 MCG PO TABS
125.0000 ug | ORAL_TABLET | Freq: Every day | ORAL | Status: DC
  Administered 2012-01-21 – 2012-01-23 (×3): 125 ug via ORAL
  Filled 2012-01-20 (×3): qty 1

## 2012-01-20 MED ORDER — FENTANYL CITRATE 0.05 MG/ML IJ SOLN: 50.0000 ug | Freq: Once | INTRAMUSCULAR | Status: DC

## 2012-01-20 MED ORDER — ONDANSETRON HCL 4 MG/2ML IJ SOLN
INTRAMUSCULAR | Status: AC
  Filled 2012-01-20: qty 2

## 2012-01-20 MED ORDER — ONDANSETRON HCL 4 MG/2ML IJ SOLN: 4.0000 mg | Freq: Once | INTRAMUSCULAR | Status: DC

## 2012-01-20 MED ORDER — SODIUM CHLORIDE 0.9 % IV SOLN
INTRAVENOUS | Status: AC
  Administered 2012-01-20: 23:00:00 via INTRAVENOUS

## 2012-01-20 MED ORDER — LORAZEPAM 1 MG PO TABS
1.0000 mg | ORAL_TABLET | Freq: Once | ORAL | Status: AC
  Administered 2012-01-20: 1 mg via ORAL
  Filled 2012-01-20: qty 1

## 2012-01-20 NOTE — ED Notes (Signed)
Attempted to call report, nurse unavailable.  Left my number, was told they'll call back.   

## 2012-01-20 NOTE — ED Notes (Signed)
Per EMS:  Pt fell in her room, son heard her hit her head on a wall or door, no LOC, no visible injury present to head.  Pt is complaining of severe L hip pain, EMS reports some shortening and some outward rotation, pulses present in that leg and foot, pt is able to move her leg.  Pt is alert, oriented x4, no neuro deficits.

## 2012-01-20 NOTE — H&P (Signed)
PCP:   Rene Paci, MD   Chief Complaint:  fall  HPI: 78 yo female fell at home earlier mechanical and hurt her left hip resulting in a left hip fracture.  No prior h/o chf, cad or cva.  Received fentanyl for her pain and is now experiencing a lot of nausea.  Also very anxious and just very worried that "this will be the end of me".  Her son is present with her.  No recent illness and is highly functional and overall very healthy.  Was just dx with pvd per vascular studies with her heart doctor recently.    Review of Systems:  O/w neg  Past Medical History: Past Medical History  Diagnosis Date  . DIABETES MELLITUS, BORDERLINE   . LOW BACK PAIN, CHRONIC   . Diverticulosis   . ANEMIA   . ANXIETY   . Arthritis   . HYPERTENSION   . HYPERLIPIDEMIA   . HYPOTHYROIDISM    Past Surgical History  Procedure Date  . Back surgery     x's 2  . Hemorrhoid surgery   . Angioplasty 1995  . Tubal ligation   . Breast surgery     Benign  . Appendectomy     Medications: Prior to Admission medications   Medication Sig Start Date End Date Taking? Authorizing Provider  amLODipine-benazepril (LOTREL) 5-20 MG per capsule Take 1 capsule by mouth daily. 08/19/11 08/18/12 Yes Newt Lukes, MD  aspirin EC 81 MG tablet Take 81 mg by mouth daily.   Yes Historical Provider, MD  Carboxymethylcellulose Sodium (LUBRICANT EYE DROPS OP) Apply 2 drops to eye daily as needed. For dry eyes   Yes Historical Provider, MD  Cholecalciferol 2000 UNITS TABS Take 2,000 Units by mouth daily.   Yes Historical Provider, MD  ezetimibe (ZETIA) 10 MG tablet Take 10 mg by mouth daily.   Yes Historical Provider, MD  levothyroxine (SYNTHROID, LEVOTHROID) 125 MCG tablet Take 125 mcg by mouth daily.   Yes Historical Provider, MD  methocarbamol (ROBAXIN) 500 MG tablet Take 500 mg by mouth daily as needed. For sleep/spasms 10/09/11  Yes Newt Lukes, MD  Multiple Vitamin (MULTIVITAMIN WITH MINERALS) TABS Take 1  tablet by mouth daily.   Yes Historical Provider, MD  naproxen (NAPROSYN) 500 MG tablet Take 500 mg by mouth daily as needed. For pain 07/25/10  Yes Historical Provider, MD  zolpidem (AMBIEN) 10 MG tablet Take 1 tablet (10 mg total) by mouth at bedtime as needed for sleep. 12/12/11  Yes Newt Lukes, MD    Allergies:  No Known Allergies  Social History:  reports that she has never smoked. She does not have any smokeless tobacco history on file. She reports that she does not drink alcohol or use illicit drugs.  Family History: Family History  Problem Relation Age of Onset  . Lung cancer Father   . Cancer Father     lung ca    Physical Exam: Filed Vitals:   01/20/12 1925 01/20/12 1929 01/20/12 1933 01/20/12 2115  BP:  110/66  151/74  Pulse:  85  76  Temp:  98.4 F (36.9 C)    TempSrc:  Oral    Resp:  17  18  SpO2: 98% 94% 97% 94%   General appearance: alert, cooperative and no distress Neck: no JVD and supple, symmetrical, trachea midline Lungs: clear to auscultation bilaterally Heart: regular rate and rhythm, S1, S2 normal, no murmur, click, rub or gallop Abdomen: soft, non-tender; bowel sounds  normal; no masses,  no organomegaly Extremities: extremities normal, atraumatic, no cyanosis or edema Pulses: 2+ and symmetric Skin: Skin color, texture, turgor normal. No rashes or lesions Neurologic: Grossly normal    Labs on Admission:   [facility] 01/20/12 2059  NA 135  K 3.7  CL 101  CO2 23  GLUCOSE 124*  BUN 21  CREATININE 0.89  CALCIUM 9.0  MG --  PHOS --    Basename 01/20/12 2059  WBC 15.2*  NEUTROABS 12.1*  HGB 10.8*  HCT 31.5*  MCV 97.5  PLT 197   Radiological Exams on Admission: Dg Chest 2 View  01/20/2012  *RADIOLOGY REPORT*  Clinical Data: Larey Seat at home  CHEST - 2 VIEW  Comparison: Chest x-ray of 02/24/2010  Findings: No active infiltrate or effusion is seen.  Mediastinal contours are stable.  The heart is mildly enlarged and stable.  The bones  are osteopenic and there is diffuse degenerative change throughout the thoracic spine.  IMPRESSION: Stable mild cardiomegaly.  No active lung disease.   Original Report Authenticated By: Juline Patch, M.D.    Dg Hip Complete Left  01/20/2012  *RADIOLOGY REPORT*  Clinical Data: Fall and hip pain.  LEFT HIP - COMPLETE 2+ VIEW  Comparison: Pelvic CT 12/25/2011  Findings: The left hip is located on the cross-table lateral views. There is a left intertrochanteric femur fracture.  The lesser trochanter fragment is displaced.  There is medial joint space narrowing in both hips.  Right hip appears to be grossly intact. Spinal surgery in the lower lumbar spine.  Pelvic bony ring is intact.  IMPRESSION: Left femoral intertrochanteric fracture.  Bilateral hip osteoarthritis.   Original Report Authenticated By: Richarda Overlie, M.D.    Ct Head Wo Contrast  01/20/2012  *RADIOLOGY REPORT*  Clinical Data:  Fall, dizziness.  CT HEAD WITHOUT CONTRAST  Technique:  Contiguous axial images were obtained from the base of the skull through the vertex without contrast.  Comparison: 10/08/2010  Findings: Prominence of the sulci, cisterns, and ventricles, in keeping with volume loss. There are subcortical and periventricular white matter hypodensities, a nonspecific finding most often seen with chronic microangiopathic changes.  There is no evidence for acute hemorrhage, overt hydrocephalus, mass lesion, or abnormal extra-axial fluid collection.  No definite CT evidence for acute cortical based (large artery) infarction. Partially opacified right maxillary sinus with mixed air/debris. Paranasal sinuses and mastoid air cells are otherwise predominately clear.  No displaced calvarial fracture.  IMPRESSION: White matter changes and volume loss as above.  No CT evidence of acute intracranial abnormality.  Right maxillary sinus opacification.  Correlate with clinical symptoms for acute sinusitis.   Original Report Authenticated By: Waneta Martins, M.D.     Assessment/Plan Present on Admission:  78 yo female mechanical fall and new left hip fracture .Hip fracture left .HYPERLIPIDEMIA .HYPERTENSION .HYPOTHYROIDISM  ekg nsr no acute changes.  For surgery tom which i agree.  Change pain meds to morphine instead of fentanyl (may be causing her nausea) and provide with some ativan for her anxiety.  Hold asa products and npo after midnight.  Full code.   Brinley Rosete A 01/20/2012, 10:21 PM

## 2012-01-20 NOTE — ED Notes (Signed)
Note pt received 500cc of NS en route

## 2012-01-20 NOTE — ED Provider Notes (Signed)
History     CSN: [id number]  Arrival date & time 01/20/12  1191   First MD Initiated Contact with Patient 01/20/12 1934      Chief Complaint  Patient presents with  . Fall  . Hip Pain    (Consider location/radiation/quality/duration/timing/severity/associated sxs/prior treatment) Patient is a 78 y.o. female presenting with fall and hip pain. The history is provided by the patient.  Fall The accident occurred less than 1 hour ago. The fall occurred while walking (lost her balance). She fell from a height of 1 to 2 ft. She landed on a hard floor. There was no blood loss. The point of impact was the head and left hip. The pain is present in the head and left hip. The pain is at a severity of 10/10. The pain is severe. She was not ambulatory at the scene. Pertinent negatives include no vomiting, no headaches, no loss of consciousness and no tingling. The symptoms are aggravated by activity and use of the injured limb. Treatment on scene includes a c-collar and a backboard. She has tried immobilization (pain meds) for the symptoms. The treatment provided mild relief.  Hip Pain Pertinent negatives include no headaches.    Past Medical History  Diagnosis Date  . DIABETES MELLITUS, BORDERLINE   . LOW BACK PAIN, CHRONIC   . Diverticulosis   . ANEMIA   . ANXIETY   . Arthritis   . HYPERTENSION   . HYPERLIPIDEMIA   . HYPOTHYROIDISM     Past Surgical History  Procedure Date  . Back surgery     x's 2  . Hemorrhoid surgery   . Angioplasty 1995  . Tubal ligation   . Breast surgery     Benign  . Appendectomy     Family History  Problem Relation Age of Onset  . Lung cancer Father   . Cancer Father     lung ca    History  Substance Use Topics  . Smoking status: Never Smoker   . Smokeless tobacco: Not on file   Comment: Married, lives with spouse Nadine Counts x 67 yrs. 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 Alcohol Use: No    OB History    Grav Para Term  Preterm Abortions TAB SAB Ect Mult Living                  Review of Systems  Gastrointestinal: Negative for vomiting.  Neurological: Negative for tingling, loss of consciousness and headaches.  All other systems reviewed and are negative.    Allergies  Review of patient's allergies indicates no known allergies.  Home Medications   Current Outpatient Rx  Name Route Sig Dispense Refill  . AMLODIPINE BESY-BENAZEPRIL HCL 5-20 MG PO CAPS Oral Take 1 capsule by mouth daily. 30 capsule 5  . ASPIRIN 81 MG PO TABS Oral Take 81 mg by mouth daily.      Marland Kitchen METHOCARBAMOL 500 MG PO TABS  AS NEEDED    . ONE-DAILY MULTI VITAMINS PO TABS Oral Take 1 tablet by mouth daily.      Marland Kitchen NAPROXEN 500 MG PO TABS  Take 1 by mouth daily as needed    . SYNTHROID 150 MCG PO TABS  TAKE ONE TABLET BY MOUTH DAILY 30 tablet 5  . VITAMIN D (CHOLECALCIFEROL) PO Oral Take 2,400 Units by mouth daily.    Marland Kitchen ZETIA 10 MG PO TABS  TAKE ONE TABLET BY MOUTH DAILY. 30 tablet 5    Authorization  is required for next refill.  Marland Kitchen ZOLPIDEM TARTRATE 10 MG PO TABS Oral Take 1 tablet (10 mg total) by mouth at bedtime as needed for sleep. 30 tablet 2    BP 110/66  Pulse 85  Temp 98.4 F (36.9 C) (Oral)  Resp 17  SpO2 97%  Physical Exam  Nursing note and vitals reviewed. Constitutional: She is oriented to person, place, and time. She appears well-developed and well-nourished. No distress.  HENT:  Head: Normocephalic and atraumatic.  Right Ear: Tympanic membrane and ear canal normal.  Left Ear: Tympanic membrane and ear canal normal.  Mouth/Throat: Oropharynx is clear and moist.  Eyes: Conjunctivae normal and EOM are normal. Pupils are equal, round, and reactive to light.  Neck: Normal range of motion. Neck supple.  Cardiovascular: Normal rate, regular rhythm and intact distal pulses.   No murmur heard. Pulmonary/Chest: Effort normal and breath sounds normal. No respiratory distress. She has no wheezes. She has no rales.    Abdominal: Soft. She exhibits no distension. There is no tenderness. There is no rebound and no guarding.  Musculoskeletal: She exhibits tenderness. She exhibits no edema.       Left hip: She exhibits decreased range of motion, tenderness and bony tenderness. She exhibits no deformity.       Thoracic back: Normal.       Lumbar back: Normal.       Legs: Neurological: She is alert and oriented to person, place, and time.  Skin: Skin is warm and dry. No rash noted. No erythema.  Psychiatric: She has a normal mood and affect. Her behavior is normal.    ED Course  Procedures (including critical care time)  Labs Reviewed  CBC WITH DIFFERENTIAL - Abnormal; Notable for the following:    WBC 15.2 (*)     RBC 3.23 (*)     Hemoglobin 10.8 (*)     HCT 31.5 (*)     RDW 18.0 (*)     Neutrophils Relative 80 (*)     Neutro Abs 12.1 (*)     All other components within normal limits  BASIC METABOLIC PANEL - Abnormal; Notable for the following:    Glucose, Bld 124 (*)     GFR calc non Af Amer 57 (*)     GFR calc Af Amer 67 (*)     All other components within normal limits   Dg Chest 2 View  01/20/2012  *RADIOLOGY REPORT*  Clinical Data: Larey Seat at home  CHEST - 2 VIEW  Comparison: Chest x-ray of 02/24/2010  Findings: No active infiltrate or effusion is seen.  Mediastinal contours are stable.  The heart is mildly enlarged and stable.  The bones are osteopenic and there is diffuse degenerative change throughout the thoracic spine.  IMPRESSION: Stable mild cardiomegaly.  No active lung disease.   Original Report Authenticated By: Juline Patch, M.D.    Dg Hip Complete Left  01/20/2012  *RADIOLOGY REPORT*  Clinical Data: Fall and hip pain.  LEFT HIP - COMPLETE 2+ VIEW  Comparison: Pelvic CT 12/25/2011  Findings: The left hip is located on the cross-table lateral views. There is a left intertrochanteric femur fracture.  The lesser trochanter fragment is displaced.  There is medial joint space narrowing in  both hips.  Right hip appears to be grossly intact. Spinal surgery in the lower lumbar spine.  Pelvic bony ring is intact.  IMPRESSION: Left femoral intertrochanteric fracture.  Bilateral hip osteoarthritis.   Original Report Authenticated By:  ADAM Lowella Dandy, M.D.    Ct Head Wo Contrast  01/20/2012  *RADIOLOGY REPORT*  Clinical Data:  Fall, dizziness.  CT HEAD WITHOUT CONTRAST  Technique:  Contiguous axial images were obtained from the base of the skull through the vertex without contrast.  Comparison: 10/08/2010  Findings: Prominence of the sulci, cisterns, and ventricles, in keeping with volume loss. There are subcortical and periventricular white matter hypodensities, a nonspecific finding most often seen with chronic microangiopathic changes.  There is no evidence for acute hemorrhage, overt hydrocephalus, mass lesion, or abnormal extra-axial fluid collection.  No definite CT evidence for acute cortical based (large artery) infarction. Partially opacified right maxillary sinus with mixed air/debris. Paranasal sinuses and mastoid air cells are otherwise predominately clear.  No displaced calvarial fracture.  IMPRESSION: White matter changes and volume loss as above.  No CT evidence of acute intracranial abnormality.  Right maxillary sinus opacification.  Correlate with clinical symptoms for acute sinusitis.   Original Report Authenticated By: Waneta Martins, M.D.     Date: 01/20/2012  Rate: 86  Rhythm: normal sinus rhythm  QRS Axis: normal  Intervals: normal  ST/T Wave abnormalities: normal  Conduction Disutrbances:none  Narrative Interpretation:   Old EKG Reviewed: unchanged    1. Fracture, intertrochanteric, left femur       MDM   Patient with a mechanical fall causing her to hit the left side of her head and landed on her left hip. Patient has significant pain over her left hip concerning for hip fracture. Denies any headaches but states the external portion of her head hurts. She does  take aspirin daily. She has no focal neurological deficits and is neurovascularly intact with normal sensation in the left lower extremity and normal pulse. No lower back pain or C-spine tenderness. Her C-spine was cleared and patient was given pain control. CBC, BMP, chest x-ray, left hip films an EKG done in anticipation for surgical clearance as it appears the patient has broken her hip.        Gwyneth Sprout, MD 01/20/12 2251

## 2012-01-20 NOTE — ED Notes (Signed)
Pt began vomiting, obtained verbal order from Dr. Anitra Lauth for 4mg  of Zofran.

## 2012-01-21 ENCOUNTER — Encounter (HOSPITAL_COMMUNITY): Payer: Self-pay | Admitting: *Deleted

## 2012-01-21 ENCOUNTER — Inpatient Hospital Stay (HOSPITAL_COMMUNITY): Payer: Medicare Other | Admitting: *Deleted

## 2012-01-21 ENCOUNTER — Encounter (HOSPITAL_COMMUNITY)
Admission: EM | Disposition: A | Discharge status: Skilled Nursing Facility | Payer: Self-pay | Source: Home / Self Care | Attending: Internal Medicine

## 2012-01-21 ENCOUNTER — Inpatient Hospital Stay (HOSPITAL_COMMUNITY): Payer: Medicare Other

## 2012-01-21 DIAGNOSIS — S72143A Displaced intertrochanteric fracture of unspecified femur, initial encounter for closed fracture: Secondary | ICD-10-CM | POA: Diagnosis not present

## 2012-01-21 DIAGNOSIS — M129 Arthropathy, unspecified: Secondary | ICD-10-CM

## 2012-01-21 DIAGNOSIS — R079 Chest pain, unspecified: Secondary | ICD-10-CM | POA: Diagnosis not present

## 2012-01-21 DIAGNOSIS — Z01810 Encounter for preprocedural cardiovascular examination: Secondary | ICD-10-CM

## 2012-01-21 DIAGNOSIS — E785 Hyperlipidemia, unspecified: Secondary | ICD-10-CM | POA: Diagnosis not present

## 2012-01-21 DIAGNOSIS — F411 Generalized anxiety disorder: Secondary | ICD-10-CM | POA: Diagnosis not present

## 2012-01-21 DIAGNOSIS — N390 Urinary tract infection, site not specified: Secondary | ICD-10-CM | POA: Diagnosis not present

## 2012-01-21 DIAGNOSIS — IMO0002 Reserved for concepts with insufficient information to code with codable children: Secondary | ICD-10-CM | POA: Diagnosis not present

## 2012-01-21 DIAGNOSIS — I10 Essential (primary) hypertension: Secondary | ICD-10-CM | POA: Diagnosis not present

## 2012-01-21 DIAGNOSIS — W19XXXA Unspecified fall, initial encounter: Secondary | ICD-10-CM | POA: Diagnosis not present

## 2012-01-21 HISTORY — PX: FEMUR IM NAIL: SHX1597

## 2012-01-21 LAB — URINALYSIS, ROUTINE W REFLEX MICROSCOPIC
Bilirubin Urine: NEGATIVE
Glucose, UA: NEGATIVE mg/dL
Ketones, ur: NEGATIVE mg/dL
Nitrite: NEGATIVE
Protein, ur: NEGATIVE mg/dL
Specific Gravity, Urine: 1.012 (ref 1.005–1.030)
Urobilinogen, UA: 0.2 mg/dL (ref 0.0–1.0)
pH: 6.5 (ref 5.0–8.0)

## 2012-01-21 LAB — GLUCOSE, CAPILLARY
Glucose-Capillary: 101 mg/dL — ABNORMAL HIGH (ref 70–99)
Glucose-Capillary: 100 mg/dL — ABNORMAL HIGH (ref 70–99)
Glucose-Capillary: 98 mg/dL (ref 70–99)
Glucose-Capillary: 136 mg/dL — ABNORMAL HIGH (ref 70–99)

## 2012-01-21 LAB — BASIC METABOLIC PANEL
BUN: 18 mg/dL (ref 6–23)
CO2: 25 mEq/L (ref 19–32)
Calcium: 8.5 mg/dL (ref 8.4–10.5)
Chloride: 104 mEq/L (ref 96–112)
Creatinine, Ser: 0.89 mg/dL (ref 0.50–1.10)
GFR calc Af Amer: 67 mL/min — ABNORMAL LOW (ref >90)
GFR calc non Af Amer: 57 mL/min — ABNORMAL LOW (ref >90)
Glucose, Bld: 109 mg/dL — ABNORMAL HIGH (ref 70–99)
Potassium: 3.9 mEq/L (ref 3.5–5.1)
Sodium: 139 mEq/L (ref 135–145)

## 2012-01-21 LAB — URINE MICROSCOPIC-ADD ON

## 2012-01-21 LAB — SURGICAL PCR SCREEN
MRSA, PCR: NEGATIVE
Staphylococcus aureus: NEGATIVE

## 2012-01-21 LAB — PROTIME-INR
INR: 1.06 (ref 0.00–1.49)
Prothrombin Time: 13.7 seconds (ref 11.6–15.2)

## 2012-01-21 LAB — CBC
HCT: 27.5 % — ABNORMAL LOW (ref 36.0–46.0)
Hemoglobin: 9.3 g/dL — ABNORMAL LOW (ref 12.0–15.0)
MCH: 33.1 pg (ref 26.0–34.0)
MCHC: 33.8 g/dL (ref 30.0–36.0)
MCV: 97.9 fL (ref 78.0–100.0)
Platelets: 162 10*3/uL (ref 150–400)
RBC: 2.81 MIL/uL — ABNORMAL LOW (ref 3.87–5.11)
RDW: 17.9 % — ABNORMAL HIGH (ref 11.5–15.5)
WBC: 11.6 10*3/uL — ABNORMAL HIGH (ref 4.0–10.5)

## 2012-01-21 SURGERY — INSERTION, INTRAMEDULLARY ROD, FEMUR
Anesthesia: General | Site: Hip | Laterality: Left | Wound class: Clean

## 2012-01-21 MED ORDER — SODIUM CHLORIDE 0.9 % IV SOLN
INTRAVENOUS | Status: DC
  Administered 2012-01-21: via INTRAVENOUS

## 2012-01-21 MED ORDER — CEFAZOLIN SODIUM-DEXTROSE 2-3 GM-% IV SOLR
2.0000 g | Freq: Four times a day (QID) | INTRAVENOUS | Status: AC
  Administered 2012-01-21 – 2012-01-22 (×3): 2 g via INTRAVENOUS
  Filled 2012-01-21 (×3): qty 50

## 2012-01-21 MED ORDER — METOCLOPRAMIDE HCL 5 MG/ML IJ SOLN: 5.0000 mg | Freq: Three times a day (TID) | INTRAMUSCULAR | Status: DC | PRN

## 2012-01-21 MED ORDER — ONDANSETRON HCL 4 MG PO TABS: 4.0000 mg | ORAL_TABLET | Freq: Four times a day (QID) | ORAL | Status: DC | PRN

## 2012-01-21 MED ORDER — HYDROMORPHONE HCL PF 1 MG/ML IJ SOLN
0.2500 mg | INTRAMUSCULAR | Status: DC | PRN
  Administered 2012-01-21 (×3): 0.5 mg via INTRAVENOUS

## 2012-01-21 MED ORDER — ONDANSETRON HCL 4 MG/2ML IJ SOLN
4.0000 mg | Freq: Four times a day (QID) | INTRAMUSCULAR | Status: DC | PRN
  Administered 2012-01-22 (×2): 4 mg via INTRAVENOUS
  Filled 2012-01-21: qty 2

## 2012-01-21 MED ORDER — CEFAZOLIN SODIUM 1-5 GM-% IV SOLN
INTRAVENOUS | Status: AC
  Filled 2012-01-21: qty 50

## 2012-01-21 MED ORDER — OXYCODONE-ACETAMINOPHEN 5-325 MG PO TABS
1.0000 | ORAL_TABLET | ORAL | Status: DC | PRN
  Administered 2012-01-22 – 2012-01-23 (×2): 2 via ORAL
  Filled 2012-01-21 (×2): qty 2

## 2012-01-21 MED ORDER — FENTANYL CITRATE 0.05 MG/ML IJ SOLN
INTRAMUSCULAR | Status: DC | PRN
  Administered 2012-01-21: 50 ug via INTRAVENOUS
  Administered 2012-01-21: 100 ug via INTRAVENOUS

## 2012-01-21 MED ORDER — WARFARIN VIDEO
Freq: Once | Status: AC
  Administered 2012-01-21: 21:00:00

## 2012-01-21 MED ORDER — CEFAZOLIN SODIUM 1-5 GM-% IV SOLN
INTRAVENOUS | Status: DC | PRN
  Administered 2012-01-21: 1 g via INTRAVENOUS

## 2012-01-21 MED ORDER — 0.9 % SODIUM CHLORIDE (POUR BTL) OPTIME
TOPICAL | Status: DC | PRN
  Administered 2012-01-21: 1000 mL

## 2012-01-21 MED ORDER — METOCLOPRAMIDE HCL 10 MG PO TABS: 5.0000 mg | ORAL_TABLET | Freq: Three times a day (TID) | ORAL | Status: DC | PRN

## 2012-01-21 MED ORDER — PHENYLEPHRINE HCL 10 MG/ML IJ SOLN
INTRAMUSCULAR | Status: DC | PRN
  Administered 2012-01-21 (×2): 80 ug via INTRAVENOUS

## 2012-01-21 MED ORDER — COUMADIN BOOK
Freq: Once | Status: AC
  Administered 2012-01-21: 21:00:00
  Filled 2012-01-21: qty 1

## 2012-01-21 MED ORDER — POLYVINYL ALCOHOL 1.4 % OP SOLN
1.0000 [drp] | Freq: Three times a day (TID) | OPHTHALMIC | Status: DC | PRN
  Filled 2012-01-21: qty 15

## 2012-01-21 MED ORDER — WARFARIN SODIUM 4 MG PO TABS
4.0000 mg | ORAL_TABLET | Freq: Once | ORAL | Status: AC
  Administered 2012-01-21: 4 mg via ORAL
  Filled 2012-01-21: qty 1

## 2012-01-21 MED ORDER — LACTATED RINGERS IV SOLN
INTRAVENOUS | Status: DC | PRN
  Administered 2012-01-21 (×2): via INTRAVENOUS

## 2012-01-21 MED ORDER — MAGNESIUM SULFATE IN D5W 10-5 MG/ML-% IV SOLN
1.0000 g | Freq: Once | INTRAVENOUS | Status: AC
  Administered 2012-01-21: 1 g via INTRAVENOUS
  Filled 2012-01-21: qty 100

## 2012-01-21 MED ORDER — HYDROMORPHONE HCL PF 1 MG/ML IJ SOLN
INTRAMUSCULAR | Status: AC
  Administered 2012-01-21: 0.5 mg via INTRAVENOUS
  Filled 2012-01-21: qty 1

## 2012-01-21 MED ORDER — ZOLPIDEM TARTRATE 10 MG PO TABS
5.0000 mg | ORAL_TABLET | Freq: Every day | ORAL | Status: DC
  Administered 2012-01-21 – 2012-01-22 (×2): 5 mg via ORAL
  Filled 2012-01-21 (×2): qty 1

## 2012-01-21 MED ORDER — HYDROMORPHONE HCL PF 1 MG/ML IJ SOLN
0.5000 mg | INTRAMUSCULAR | Status: DC | PRN
  Administered 2012-01-21: 0.5 mg via INTRAVENOUS
  Filled 2012-01-21: qty 1

## 2012-01-21 MED ORDER — DEXTROSE 5 % IV SOLN
3.0000 g | INTRAVENOUS | Status: AC
  Administered 2012-01-21: 3 g via INTRAVENOUS
  Filled 2012-01-21: qty 3000

## 2012-01-21 MED ORDER — METOPROLOL TARTRATE 25 MG PO TABS
25.0000 mg | ORAL_TABLET | Freq: Two times a day (BID) | ORAL | Status: DC
  Administered 2012-01-21 – 2012-01-23 (×4): 25 mg via ORAL
  Filled 2012-01-21 (×6): qty 1

## 2012-01-21 MED ORDER — LIDOCAINE HCL (CARDIAC) 20 MG/ML IV SOLN
INTRAVENOUS | Status: DC | PRN
  Administered 2012-01-21: 60 mg via INTRAVENOUS

## 2012-01-21 MED ORDER — HYDROCODONE-ACETAMINOPHEN 5-325 MG PO TABS
1.0000 | ORAL_TABLET | ORAL | Status: DC | PRN
  Administered 2012-01-22: 1 via ORAL
  Filled 2012-01-21: qty 1

## 2012-01-21 MED ORDER — CHLORHEXIDINE GLUCONATE 4 % EX LIQD
60.0000 mL | Freq: Once | CUTANEOUS | Status: AC
  Administered 2012-01-21: 4 via TOPICAL
  Filled 2012-01-21: qty 60

## 2012-01-21 MED ORDER — METOPROLOL TARTRATE 1 MG/ML IV SOLN
5.0000 mg | INTRAVENOUS | Status: DC | PRN
  Filled 2012-01-21: qty 5

## 2012-01-21 MED ORDER — WARFARIN - PHARMACIST DOSING INPATIENT: Freq: Every day | Status: DC

## 2012-01-21 MED ORDER — PROPOFOL 10 MG/ML IV BOLUS
INTRAVENOUS | Status: DC | PRN
  Administered 2012-01-21: 160 mg via INTRAVENOUS

## 2012-01-21 SURGICAL SUPPLY — 39 items
BIT DRILL CANN LG 4.3MM (BIT) IMPLANT
BLADE SURG 15 STRL LF DISP TIS (BLADE) ×1 IMPLANT
BLADE SURG 15 STRL SS (BLADE) ×2
CLOTH BEACON ORANGE TIMEOUT ST (SAFETY) ×2 IMPLANT
COVER SURGICAL LIGHT HANDLE (MISCELLANEOUS) ×2 IMPLANT
COVER TABLE BACK 60X90 (DRAPES) ×2 IMPLANT
DRAPE C-ARM 42X72 X-RAY (DRAPES) ×2 IMPLANT
DRAPE STERI IOBAN 125X83 (DRAPES) ×2 IMPLANT
DRILL BIT CANN LG 4.3MM (BIT) ×2
DRSG ADAPTIC 3X8 NADH LF (GAUZE/BANDAGES/DRESSINGS) ×1 IMPLANT
DRSG MEPILEX BORDER 4X4 (GAUZE/BANDAGES/DRESSINGS) ×4 IMPLANT
DRSG MEPILEX BORDER 4X8 (GAUZE/BANDAGES/DRESSINGS) ×1 IMPLANT
DRSG PAD ABDOMINAL 8X10 ST (GAUZE/BANDAGES/DRESSINGS) ×1 IMPLANT
ELECT REM PT RETURN 9FT ADLT (ELECTROSURGICAL) ×2
ELECTRODE REM PT RTRN 9FT ADLT (ELECTROSURGICAL) ×1 IMPLANT
EVACUATOR 1/8 PVC DRAIN (DRAIN) IMPLANT
GLOVE BIOGEL PI IND STRL 6.5 (GLOVE) IMPLANT
GLOVE BIOGEL PI IND STRL 9 (GLOVE) ×1 IMPLANT
GLOVE BIOGEL PI INDICATOR 6.5 (GLOVE) ×1
GLOVE BIOGEL PI INDICATOR 9 (GLOVE) ×1
GLOVE SURG ORTHO 9.0 STRL STRW (GLOVE) ×2 IMPLANT
GLOVE SURG SS PI 6.0 STRL IVOR (GLOVE) ×1 IMPLANT
GOWN PREVENTION PLUS XLARGE (GOWN DISPOSABLE) ×2 IMPLANT
GOWN SRG XL XLNG 56XLVL 4 (GOWN DISPOSABLE) ×2 IMPLANT
GOWN STRL NON-REIN XL XLG LVL4 (GOWN DISPOSABLE) ×4
KIT BASIN OR (CUSTOM PROCEDURE TRAY) ×2 IMPLANT
KIT ROOM TURNOVER OR (KITS) ×2 IMPLANT
MANIFOLD NEPTUNE II (INSTRUMENTS) ×2 IMPLANT
NAIL HIP FRACT 130D 11X180 (Screw) ×1 IMPLANT
NS IRRIG 1000ML POUR BTL (IV SOLUTION) ×2 IMPLANT
PACK GENERAL/GYN (CUSTOM PROCEDURE TRAY) ×2 IMPLANT
PAD ARMBOARD 7.5X6 YLW CONV (MISCELLANEOUS) ×4 IMPLANT
SCREW BONE CORTICAL 5.0X3 (Screw) ×1 IMPLANT
SCREW LAG HIP NAIL 10.5X95 (Screw) ×1 IMPLANT
STAPLER VISISTAT 35W (STAPLE) ×1 IMPLANT
SUT VIC AB 2-0 CTB1 (SUTURE) IMPLANT
TAPE CLOTH SURG 6X10 WHT LF (GAUZE/BANDAGES/DRESSINGS) ×1 IMPLANT
VERSANAIL THREADED GUIDE PIN ×1 IMPLANT
WATER STERILE IRR 1000ML POUR (IV SOLUTION) ×3 IMPLANT

## 2012-01-21 NOTE — Op Note (Signed)
OPERATIVE REPORT  DATE OF SURGERY: 01/21/2012  PATIENT:  Brittney Gates,  78 y.o. female  PRE-OPERATIVE DIAGNOSIS:  Left Intertrochanteric Femoral Fracture  POST-OPERATIVE DIAGNOSIS:  Left Intertrochanteric Femoral Fracture  PROCEDURE:  Procedure(s): INTRAMEDULLARY (IM) NAIL FEMORAL Biomet 11 mm x 180 mm nail locked proximally with a 95 mm barrel and locked distally with a 34 mm screw  SURGEON:  Surgeon(s): Nadara Mustard, MD  ANESTHESIA:   general  EBL:  Minimal ML  SPECIMEN:  No Specimen  TOURNIQUET:  * No tourniquets in log *  PROCEDURE DETAILS: Patient is an 78 year old woman who fell sustaining an intertrochanteric fracture. Risks and benefits were discussed with the patient and her son they state they understand and wish to proceed at this time. Description of procedure patient brought to the operating room and underwent a general anesthetic. After adequate levels of anesthesia were obtained patient was placed in the [facility] fracture table the left lower extremity was placed in boot traction the fracture was reduced and the right lower family was placed in the dorsal lithotomy position. The left hip was prepped using DuraPrep draped in a sterile field with the shower curtain. A incision was made just proximal greater trochanter guidewire was inserted from the greater trochanter down the stem of the shaft. C-arm possibly verified alignment and a drill was used to overdrill the guidewire. The intramedullary nail was placed using the external alignment jig a guidepin was then inserted center center into the femoral head this measured 95 mm this was drilled for the 95 mm arrow and the 95 mm of care was placed and secured proximally. The distal interlocking screw was placed and 34 mm. C-arm fluoroscopy verified alignment and reduction. The jig was removed the wounds were irrigated normal saline the incisions were closed with 2-0 Vicryl and staples a Mepilex dressing was applied patient was  extubated taken to the PACU in stable condition.  PLAN OF CARE: Admit to inpatient   PATIENT DISPOSITION:  PACU - hemodynamically stable.   Nadara Mustard, MD 01/21/2012 6:43 PM

## 2012-01-21 NOTE — Consult Note (Signed)
CARDIOLOGY CONSULT NOTE  Patient ID: Brittney Gates, MRN: [id number], DOB/AGE: October 21, 1924 78 y.o. Admit date: 01/20/2012 Date of Consult: 01/21/2012  Primary Physician: Rene Paci, MD Primary Cardiologist: Dr. Excell Seltzer  Chief Complaint: left hip pain after fall Reason for Consultation: Preoperative clearance for left hip fracture repair  HPI: 78 y.o. female w/ PMHx significant for PAD, HTN, HLD (statin intolerant), DM (borderilne, diet controlled), Anemia, and Hypothyroidism who presented to [facility] on 01/20/2012 after a fall resulting in left hip fracture.  Echo 2011 showed normal LV function, EF 55-60%, no WMAs. Nuclear stress test in January 2012 demonstrated normal myocardial perfusion and EF 57%. Was seen in clinic by Dr. Copper on 12/11/11 for evaluation of left leg numbness and pain. She also noted occasional chest pressure that is unrelated to exertion and has not changed in frequency or severity and was felt to be noncardiac. She was scheduled for CT angiogram of her aortoiliac region with runoff which was completed on 9/26 and demonstrated significant stenosis and dense calcification of the distal superficial femoral artery and the abductor canal, no significant focal stenosis of the right lower extremity, and no evidence of significant aortic or iliac arterial stenosis.  Patient reports being in her bedroom yesterday evening when she was walking to her bed and fell to the ground. She is not exactly sure how she fell as she did not trip on anything, but thinks her left leg gave out. She has had increasing pain in her left leg and lower back over the past 3-4 days. Denies preceding chest pain, palpitations, sob, dizziness, visual changes, or focal neuro deficits. No seizure like activity or incontinence. No loss of consciousness. She hit her head and injured her left hip during the fall. She was unable to stand up prompting her family to call EMS. She is normally able to do all  ADLs without chest pain or sob. She does have occasional chest "fatigue" when walking fast. Denies recent illness, fever, change in bladder/bowel habits, melena/hematochezia.   In the ED, EKG showed sinus rhythm with chronic LBBB. CXR was without acute cardiopulmonary findings. Head CT was without acute intracranial findings. Hip xray showed Left femoral intertrochanteric fracture. Labs were significant for Glucose 124, WBC 15.2, Hgb 10.8, otherwise unremarkable CBC/BMET. She was admitted by the medicine service with plans for surgical repair today. Cardiology is asked to assist in preoperative risk evaluation. No hypoxia, tachypnea, or tachycardia. SBPs 110-150s (antihypertensives on hold) and heart rates 70-90s.   Past Medical History  Diagnosis Date  . DIABETES MELLITUS, BORDERLINE   . LOW BACK PAIN, CHRONIC   . Diverticulosis   . ANEMIA   . ANXIETY   . Arthritis   . HYPERTENSION   . HYPERLIPIDEMIA   . HYPOTHYROIDISM   . PAD (peripheral artery disease)      2011 - Echo Study Conclusions: Left ventricle: The cavity size was normal. Wall thickness was normal. Systolic function was normal. The estimated ejection fraction was in the range of 55% to 60%. Wall motion was normal; there were no regional wall motion abnormalities. Left ventricular diastolic function parameters were normal.  2012 - Myoview Overall Impression  Exercise Capacity: Lexiscan with no exercise.  BP Response: Normal blood pressure response.  Clinical Symptoms: No chest pain  ECG Impression: No significant ST segment change suggestive of ischemia.  QGS EF: 57 % Overall Impression: Normal stress nuclear study.  11/04/11 - ABI No evidence of segmental stenosis in the lower extremities. The right  ABI is in the normal range. The left ABI is in the moderate ranges. The bilateral brachial great toe indices are abnormal and not at risk for tissue loss. Suggest PV consult if clinically indicated and possible CTA since a focal  elevation in velocities was not detected despite moderate decrease in ABI on left.  12/25/2011  -  CT ANGIOGRAPHY OF ABDOMINAL AORTA WITH ILIOFEMORAL RUNOFF   IMPRESSION: No evidence of significant aortic or iliac arterial stenosis.  In the right lower extremity, moderate atherosclerotic changes of the distal superficial femoral artery without significant focal stenosis and two-vessel runoff to the right ankle via the peroneal and posterior tibial arteries.  There is some narrowing at the origin of the left superficial femoral artery.  There is also felt to be a significant stenosis of the distal superficial femoral artery and the abductor canal.  It is densely calcified which may exaggerate the narrowing.  Three- vessel runoff to the left ankle.  Varicose veins along the medial left thigh and leg are present. The patient may have venous insufficiency of the left lower extremity.   Surgical History:  Past Surgical History  Procedure Date  . Back surgery     x's 2  . Hemorrhoid surgery   . Angioplasty 1995  . Tubal ligation   . Breast surgery     Benign  . Appendectomy      Home Meds: Medication Sig  amLODipine-benazepril (LOTREL) 5-20 MG per capsule Take 1 capsule by mouth daily.  aspirin EC 81 MG tablet Take 81 mg by mouth daily.  Carboxymethylcellulose Sodium (LUBRICANT EYE DROPS OP) Apply 2 drops to eye daily as needed. For dry eyes  Cholecalciferol 2000 UNITS TABS Take 2,000 Units by mouth daily.  ezetimibe (ZETIA) 10 MG tablet Take 10 mg by mouth daily.  levothyroxine (SYNTHROID, LEVOTHROID) 125 MCG tablet Take 125 mcg by mouth daily.  methocarbamol (ROBAXIN) 500 MG tablet Take 500 mg by mouth daily as needed. For sleep/spasms  Multiple Vitamin (MULTIVITAMIN WITH MINERALS) TABS Take 1 tablet by mouth daily.  naproxen (NAPROSYN) 500 MG tablet Take 500 mg by mouth daily as needed. For pain  zolpidem (AMBIEN) 10 MG tablet Take 1 tablet (10 mg total) by mouth at bedtime as needed for  sleep.    Inpatient Medications:   . sodium chloride   Intravenous STAT  . fentaNYL  50 mcg Intravenous Once  . fentaNYL  50 mcg Intravenous Once  . levothyroxine  125 mcg Oral Daily  . LORazepam  1 mg Oral Once  . ondansetron  4 mg Intravenous Once  . ondansetron (ZOFRAN) IV  4 mg Intravenous Once  . ondansetron (ZOFRAN) IV  4 mg Intravenous Once  . zolpidem  5 mg Oral QHS  . DISCONTD: fentaNYL  50 mcg Intravenous Once  . DISCONTD: ondansetron  4 mg Intravenous Once   . 0.9 % NaCl with KCl 20 mEq / L 75 mL/hr at 01/21/12 0234    Allergies: No Known Allergies  History   Social History  . Marital Status: Married    Spouse Name: N/A    Number of Children: N/A  . Years of Education: N/A   Occupational History  . Not on file.   Social History Main Topics  . Smoking status: Never Smoker   . Smokeless tobacco: Not on file   Comment: Married, lives with spouse Nadine Counts x 67 yrs. 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 Alcohol Use: No  . Drug  Use: No  . Sexually Active: Not on file   Other Topics Concern  . Not on file   Social History Narrative  . No narrative on file     Family History  Problem Relation Age of Onset  . Lung cancer Father   . Cancer Father     lung ca     Review of Systems: General: negative for chills, fever, night sweats or weight changes.  Cardiovascular: negative for chest pain, shortness of breath, dyspnea on exertion, edema, orthopnea, palpitations, or paroxysmal nocturnal dyspnea Vascular: (+) left leg numbness/pain Dermatological: negative for rash Respiratory: negative for cough or wheezing Urologic: negative for hematuria Abdominal: negative for nausea, vomiting, diarrhea, bright red blood per rectum, melena, or hematemesis Neurologic: negative for visual changes, syncope, or dizziness Musculoskeletal: (+) Left hip pain, low back pain All other systems reviewed and are otherwise negative except as noted  above.  Labs:  Component Value Date   WBC 11.6* 01/21/2012   HGB 9.3* 01/21/2012   HCT 27.5* 01/21/2012   MCV 97.9 01/21/2012   PLT 162 01/21/2012    Lab 01/21/12 0545  NA 139  K 3.9  CL 104  CO2 25  BUN 18  CREATININE 0.89  CALCIUM 8.5  GLUCOSE 109*     01/21/2012 05:45  Prothrombin Time 13.7  INR 1.06    Radiology/Studies:   01/20/2012 - CHEST - 2 VIEW  Findings: No active infiltrate or effusion is seen.  Mediastinal contours are stable.  The heart is mildly enlarged and stable.  The bones are osteopenic and there is diffuse degenerative change throughout the thoracic spine.  IMPRESSION: Stable mild cardiomegaly.  No active lung disease.    01/20/2012 - LEFT HIP - COMPLETE 2+ VIEW   Findings: The left hip is located on the cross-table lateral views. There is a left intertrochanteric femur fracture.  The lesser trochanter fragment is displaced.  There is medial joint space narrowing in both hips.  Right hip appears to be grossly intact. Spinal surgery in the lower lumbar spine.  Pelvic bony ring is intact.  IMPRESSION: Left femoral intertrochanteric fracture.  Bilateral hip osteoarthritis.  01/20/2012 - CT HEAD WITHOUT CONTRAST   Findings: Prominence of the sulci, cisterns, and ventricles, in keeping with volume loss. There are subcortical and periventricular white matter hypodensities, a nonspecific finding most often seen with chronic microangiopathic changes.  There is no evidence for acute hemorrhage, overt hydrocephalus, mass lesion, or abnormal extra-axial fluid collection.  No definite CT evidence for acute cortical based (large artery) infarction. Partially opacified right maxillary sinus with mixed air/debris. Paranasal sinuses and mastoid air cells are otherwise predominately clear.  No displaced calvarial fracture.  IMPRESSION: White matter changes and volume loss as above.  No CT evidence of acute intracranial abnormality.  Right maxillary sinus opacification.   Correlate with clinical symptoms for acute sinusitis.      EKG: 01/20/12 @ 2008 - Sinus rhythm 86bpm, LBBB (unchanged from prior EKG)  Physical Exam: Blood pressure 130/62, pulse 91, temperature 98 F (36.7 C), temperature source Axillary, resp. rate 16, height 5\' 7"  (1.702 m), weight 171 lb 1.6 oz (77.61 kg), SpO2 92.00%. General: Well developed, elderly white female in no acute distress. Head: Normocephalic, atraumatic, sclera non-icteric, no xanthomas, nares are without discharge.  Neck: Supple. Negative for carotid bruits or JVD Lungs: Clear bilaterally to auscultation without wheezes, rales, or rhonchi. Breathing is unlabored. Heart: RRR with S1 S2. 1/6 systolic murmur LUSB. No rubs or gallops appreciated.  Abdomen: Soft, non-tender, non-distended with normoactive bowel sounds. No rebound/guarding. No obvious abdominal masses. Msk:  Strength and tone appear normal for age. Extremities: Left hip pain with movement. Left pedal pulse not palpable, foot warm to touch, good sensation. No clubbing or cyanosis. No edema.  Right pedal pulse intact. Neuro: Alert and oriented X 3. Moves all extremities spontaneously. Psych:  Responds to questions appropriately with an anxious affect.   Assessment and Plan:  78 y.o. female w/ PMHx significant for PAD, HTN, HLD (statin intolerant), Borderline DM (diet controlled), Anemia, and Hypothyroidism who [facility] to [facility] on 01/20/2012 after a fall resulting in left hip fracture.  1. Fall with resultant left hip fracture 2. Peripheral Arterial Disease 3. Anemia 4. Hypertension 5. Hyperlipidemia 6. Borderline DM 7. Hypothyroidism 8. Occasional chest tightness with multiple normal Myoviews - last 1/12  Signed, HOPE, JESSICA PA-C 01/21/2012, 10:22 AM  Patient seen and examined independently. [facility], PA-C note reviewed carefully - agree with her assessment and plan. I have edited the note based on my findings.   Brittney Gates does have  intermittent exertional chest tightness but this been stable for years. Her functional capacity is well preserved for her age and has had multiple normal stress tests including most recently last year. Overall, I feel that she is at mild to moderate risk for peri-operative cardiac complications and would recommend proceeding to surgery without any further cardiac testing. Can start lopressor 12.5 bid as HR and BP tolerates. I have discussed this with her and she is in agreement.   Nilan Iddings,MD 12:07 PM

## 2012-01-21 NOTE — Transfer of Care (Signed)
Immediate Anesthesia Transfer of Care Note  Patient: Brittney Gates  Procedure(s) Performed: Procedure(s) (LRB) with comments: INTRAMEDULLARY (IM) NAIL FEMORAL (Left) - Intertrochanteric Nail Left Hip  Patient Location: PACU  Anesthesia Type: General  Level of Consciousness: awake and alert   Airway & Oxygen Therapy: Patient Spontanous Breathing and Patient connected to face mask oxygen  Post-op Assessment: Report given to PACU RN and Post -op Vital signs reviewed and stable  Post vital signs: Reviewed and stable  Complications: No apparent anesthesia complications

## 2012-01-21 NOTE — Clinical Social Work Psychosocial (Signed)
     Clinical Social Work Department BRIEF PSYCHOSOCIAL ASSESSMENT 01/21/2012  Patient:  Brittney Gates, Brittney Gates     Account Number:  [id number]     Admit date:  01/20/2012  Clinical Social Worker:  Tiburcio Pea  Date/Time:  01/21/2012 03:08 PM  Referred by:  Physician  Date Referred:  01/21/2012 Referred for  SNF Placement   Other Referral:   Interview type:  Other - See comment Other interview type:   Met with patient in the room; spoke to son John via telephone while in her room per her request.    PSYCHOSOCIAL DATA Living Status:  HUSBAND Admitted from facility:   Level of care:   Primary support name:  Kimmora Risenhoover [phone] (c) [phone] Primary support relationship to patient:  CHILD, ADULT Degree of support available:   Strong support    Husband and other son are also supporive   Edeline Greening [phone]  (son)    CURRENT CONCERNS Current Concerns  Post-Acute Placement   Other Concerns:    SOCIAL WORK ASSESSMENT / PLAN Met with patient today-  she is scheduled to undergo surgery this evening around 6pm for hip repair. Patient relates that she lives with her husband and she feels that she will need short term rehab before she returns home. She has 2 very supportive sons.  During our visit- pt's son [name] called the room and pt requested that CSW discuss above with him. He stated that the family agreed with this and want patient to be placed at [facility] for her rehab. They strongly request a private room.  Bed search process discuss and son remains adamant that pt goes to [facility].  Fl2 placed on chart.   Assessment/plan status:  Psychosocial Support/Ongoing Assessment of Needs Other assessment/ plan:   Information/referral to community resources:   SNF list left with patient  Discussed after care needs to be arranged as indicated by SNF prior to d/c home    PATIENTS/FAMILYS RESPONSE TO PLAN OF CARE: Patient is alert, oriented and very pleasant. She is  requesting short term SNF after surgery. Her husband and sons agree. Patient and son were requesting a health care power of attorney which CSW was going to assist with- however- patient stated that her husand is already her HCPOA and she does not wish to change this. She feels that her family is very close knit and her sons would support both her and her husband as needed.  Will monitor and assist patient and family with placement.

## 2012-01-21 NOTE — Progress Notes (Signed)
ANTICOAGULATION CONSULT NOTE - Initial Consult  Pharmacy Consult for Coumadin Indication: VTE prophylaxis  No Known Allergies  Patient Measurements: Height: 5\' 7"  (170.2 cm) Weight: 171 lb 1.6 oz (77.61 kg) IBW/kg (Calculated) : 61.6  Vital Signs: Temp: 98.1 F (36.7 C) (10/23 1955) BP: 119/54 mmHg (10/23 1945) Pulse Rate: 65  (10/23 1955)  Labs:  [facility] 01/21/12 0545 01/20/12 2059  HGB 9.3* 10.8*  HCT 27.5* 31.5*  PLT 162 197  APTT -- --  LABPROT 13.7 --  INR 1.06 --  HEPARINUNFRC -- --  CREATININE 0.89 0.89  CKTOTAL -- --  CKMB -- --  TROPONINI -- --    Estimated Creatinine Clearance: 49.6 ml/min (by C-G formula based on Cr of 0.89).   Medical History: Past Medical History  Diagnosis Date  . DIABETES MELLITUS, BORDERLINE     A1c 6.0 in 2011, Diet controlled  . LOW BACK PAIN, CHRONIC   . Diverticulosis   . ANEMIA   . ANXIETY   . Arthritis   . HYPERTENSION   . HYPERLIPIDEMIA   . HYPOTHYROIDISM   . PAD (peripheral artery disease)     Medications:  Prescriptions prior to admission  Medication Sig Dispense Refill  . amLODipine-benazepril (LOTREL) 5-20 MG per capsule Take 1 capsule by mouth daily.  30 capsule  5  . aspirin EC 81 MG tablet Take 81 mg by mouth daily.      . Carboxymethylcellulose Sodium (LUBRICANT EYE DROPS OP) Apply 2 drops to eye daily as needed. For dry eyes      . Cholecalciferol 2000 UNITS TABS Take 2,000 Units by mouth daily.      Marland Kitchen ezetimibe (ZETIA) 10 MG tablet Take 10 mg by mouth daily.      Marland Kitchen levothyroxine (SYNTHROID, LEVOTHROID) 125 MCG tablet Take 125 mcg by mouth daily.      . methocarbamol (ROBAXIN) 500 MG tablet Take 500 mg by mouth daily as needed. For sleep/spasms      . Multiple Vitamin (MULTIVITAMIN WITH MINERALS) TABS Take 1 tablet by mouth daily.      . naproxen (NAPROSYN) 500 MG tablet Take 500 mg by mouth daily as needed. For pain      . zolpidem (AMBIEN) 10 MG tablet Take 1 tablet (10 mg total) by mouth at bedtime as  needed for sleep.  30 tablet  2   Scheduled:    . sodium chloride   Intravenous STAT  .  ceFAZolin (ANCEF) IV  3 g Intravenous 60 min Pre-Op  .  ceFAZolin (ANCEF) IV  2 g Intravenous Q6H  . chlorhexidine  60 mL Topical Once  . fentaNYL  50 mcg Intravenous Once  . levothyroxine  125 mcg Oral Daily  . LORazepam  1 mg Oral Once  . magnesium sulfate 1 - 4 g bolus IVPB  1 g Intravenous Once  . metoprolol tartrate  25 mg Oral BID  . ondansetron (ZOFRAN) IV  4 mg Intravenous Once  . ondansetron (ZOFRAN) IV  4 mg Intravenous Once  . zolpidem  5 mg Oral QHS  . DISCONTD: fentaNYL  50 mcg Intravenous Once  . DISCONTD: ondansetron  4 mg Intravenous Once    Assessment: 78 YOF s/p intramedullary nail to left hip to start Coumadin for VTE prophylaxis. INR baseline is 1.06. Patient was not on anticoagulants prior to admission. Hgb down at 9.3 pre-op. No significant bleeding per OP reports.   Goal of Therapy:  INR 2-3   Plan:  1. Coumadin 4mg  po  x1 tonight (starting lower due to age >76YO) 2. Follow-up daily PT/INR. 3. Follow-up CBC in AM.  4. Coumadin book and video to start education.   Link Snuffer, PharmD, BCPS Clinical Pharmacist [phone] 01/21/2012, 8:51 PM

## 2012-01-21 NOTE — Preoperative (Signed)
Beta Blockers  Pt took 25 mgs metoprolol @ 11:45 0n 01-21-12

## 2012-01-21 NOTE — H&P (Signed)
Brittney Gates is an 78 y.o. female.   Chief Complaint: Patient with recent history of multiple falls fell on her left hip sustaining a left intertrochanteric hip fracture. HPI: Patient denies any syncopal event states she lost her balance fell landed on her left hip.  Past Medical History  Diagnosis Date  . DIABETES MELLITUS, BORDERLINE     A1c 6.0 in 2011, Diet controlled  . LOW BACK PAIN, CHRONIC   . Diverticulosis   . ANEMIA   . ANXIETY   . Arthritis   . HYPERTENSION   . HYPERLIPIDEMIA   . HYPOTHYROIDISM   . PAD (peripheral artery disease)     Past Surgical History  Procedure Date  . Back surgery     x's 2  . Hemorrhoid surgery   . Angioplasty 1995  . Tubal ligation   . Breast surgery     Benign  . Appendectomy     Family History  Problem Relation Age of Onset  . Lung cancer Father   . Cancer Father     lung ca   Social History:  reports that she has never smoked. She does not have any smokeless tobacco history on file. She reports that she does not drink alcohol or use illicit drugs.  Allergies: No Known Allergies  Medications Prior to Admission  Medication Sig Dispense Refill  . amLODipine-benazepril (LOTREL) 5-20 MG per capsule Take 1 capsule by mouth daily.  30 capsule  5  . aspirin EC 81 MG tablet Take 81 mg by mouth daily.      . Carboxymethylcellulose Sodium (LUBRICANT EYE DROPS OP) Apply 2 drops to eye daily as needed. For dry eyes      . Cholecalciferol 2000 UNITS TABS Take 2,000 Units by mouth daily.      Marland Kitchen ezetimibe (ZETIA) 10 MG tablet Take 10 mg by mouth daily.      Marland Kitchen levothyroxine (SYNTHROID, LEVOTHROID) 125 MCG tablet Take 125 mcg by mouth daily.      . methocarbamol (ROBAXIN) 500 MG tablet Take 500 mg by mouth daily as needed. For sleep/spasms      . Multiple Vitamin (MULTIVITAMIN WITH MINERALS) TABS Take 1 tablet by mouth daily.      . naproxen (NAPROSYN) 500 MG tablet Take 500 mg by mouth daily as needed. For pain      . zolpidem (AMBIEN) 10 MG  tablet Take 1 tablet (10 mg total) by mouth at bedtime as needed for sleep.  30 tablet  2    Results for orders placed during the hospital encounter of 01/20/12 (from the past 48 hour(s))  CBC WITH DIFFERENTIAL     Status: Abnormal   Collection Time   01/20/12  8:59 PM      Component Value Range Comment   WBC 15.2 (*) 4.0 - 10.5 K/uL    RBC 3.23 (*) 3.87 - 5.11 MIL/uL    Hemoglobin 10.8 (*) 12.0 - 15.0 g/dL    HCT 19.1 (*) 47.8 - 46.0 %    MCV 97.5  78.0 - 100.0 fL    MCH 33.4  26.0 - 34.0 pg    MCHC 34.3  30.0 - 36.0 g/dL    RDW 29.5 (*) 62.1 - 15.5 %    Platelets 197  150 - 400 K/uL    Neutrophils Relative 80 (*) 43 - 77 %    Neutro Abs 12.1 (*) 1.7 - 7.7 K/uL    Lymphocytes Relative 15  12 - 46 %  Lymphs Abs 2.3  0.7 - 4.0 K/uL    Monocytes Relative 5  3 - 12 %    Monocytes Absolute 0.7  0.1 - 1.0 K/uL    Eosinophils Relative 1  0 - 5 %    Eosinophils Absolute 0.1  0.0 - 0.7 K/uL    Basophils Relative 0  0 - 1 %    Basophils Absolute 0.1  0.0 - 0.1 K/uL   BASIC METABOLIC PANEL     Status: Abnormal   Collection Time   01/20/12  8:59 PM      Component Value Range Comment   Sodium 135  135 - 145 mEq/L    Potassium 3.7  3.5 - 5.1 mEq/L    Chloride 101  96 - 112 mEq/L    CO2 23  19 - 32 mEq/L    Glucose, Bld 124 (*) 70 - 99 mg/dL    BUN 21  6 - 23 mg/dL    Creatinine, Ser 5.62  0.50 - 1.10 mg/dL    Calcium 9.0  8.4 - 13.0 mg/dL    GFR calc non Af Amer 57 (*) >90 mL/min    GFR calc Af Amer 67 (*) >90 mL/min   BASIC METABOLIC PANEL     Status: Abnormal   Collection Time   01/21/12  5:45 AM      Component Value Range Comment   Sodium 139  135 - 145 mEq/L    Potassium 3.9  3.5 - 5.1 mEq/L    Chloride 104  96 - 112 mEq/L    CO2 25  19 - 32 mEq/L    Glucose, Bld 109 (*) 70 - 99 mg/dL    BUN 18  6 - 23 mg/dL    Creatinine, Ser 8.65  0.50 - 1.10 mg/dL    Calcium 8.5  8.4 - 78.4 mg/dL    GFR calc non Af Amer 57 (*) >90 mL/min    GFR calc Af Amer 67 (*) >90 mL/min   CBC      Status: Abnormal   Collection Time   01/21/12  5:45 AM      Component Value Range Comment   WBC 11.6 (*) 4.0 - 10.5 K/uL    RBC 2.81 (*) 3.87 - 5.11 MIL/uL    Hemoglobin 9.3 (*) 12.0 - 15.0 g/dL    HCT 69.6 (*) 29.5 - 46.0 %    MCV 97.9  78.0 - 100.0 fL    MCH 33.1  26.0 - 34.0 pg    MCHC 33.8  30.0 - 36.0 g/dL    RDW 28.4 (*) 13.2 - 15.5 %    Platelets 162  150 - 400 K/uL   PROTIME-INR     Status: Normal   Collection Time   01/21/12  5:45 AM      Component Value Range Comment   Prothrombin Time 13.7  11.6 - 15.2 seconds    INR 1.06  0.00 - 1.49   GLUCOSE, CAPILLARY     Status: Abnormal   Collection Time   01/21/12  7:39 AM      Component Value Range Comment   Glucose-Capillary 101 (*) 70 - 99 mg/dL    Comment 1 Notify RN      Comment 2 Documented in Chart     GLUCOSE, CAPILLARY     Status: Abnormal   Collection Time   01/21/12 11:30 AM      Component Value Range Comment   Glucose-Capillary 100 (*) 70 - 99  mg/dL    Comment 1 Notify RN      Comment 2 Documented in Chart     URINALYSIS, ROUTINE W REFLEX MICROSCOPIC     Status: Abnormal   Collection Time   01/21/12 11:51 AM      Component Value Range Comment   Color, Urine YELLOW  YELLOW    APPearance CLEAR  CLEAR    Specific Gravity, Urine 1.012  1.005 - 1.030    pH 6.5  5.0 - 8.0    Glucose, UA NEGATIVE  NEGATIVE mg/dL    Hgb urine dipstick SMALL (*) NEGATIVE    Bilirubin Urine NEGATIVE  NEGATIVE    Ketones, ur NEGATIVE  NEGATIVE mg/dL    Protein, ur NEGATIVE  NEGATIVE mg/dL    Urobilinogen, UA 0.2  0.0 - 1.0 mg/dL    Nitrite NEGATIVE  NEGATIVE    Leukocytes, UA MODERATE (*) NEGATIVE   URINE MICROSCOPIC-ADD ON     Status: Abnormal   Collection Time   01/21/12 11:51 AM      Component Value Range Comment   Squamous Epithelial / LPF RARE  RARE    WBC, UA 11-20  <3 WBC/hpf    RBC / HPF 3-6  <3 RBC/hpf    Bacteria, UA MANY (*) RARE   SURGICAL PCR SCREEN     Status: Normal   Collection Time   01/21/12 12:25 PM       Component Value Range Comment   MRSA, PCR NEGATIVE  NEGATIVE    Staphylococcus aureus NEGATIVE  NEGATIVE   GLUCOSE, CAPILLARY     Status: Normal   Collection Time   01/21/12  4:19 PM      Component Value Range Comment   Glucose-Capillary 98  70 - 99 mg/dL    Comment 1 Documented in Chart      Comment 2 Notify RN      Dg Chest 2 View  01/20/2012  *RADIOLOGY REPORT*  Clinical Data: Larey Seat at home  CHEST - 2 VIEW  Comparison: Chest x-ray of 02/24/2010  Findings: No active infiltrate or effusion is seen.  Mediastinal contours are stable.  The heart is mildly enlarged and stable.  The bones are osteopenic and there is diffuse degenerative change throughout the thoracic spine.  IMPRESSION: Stable mild cardiomegaly.  No active lung disease.   Original Report Authenticated By: Juline Patch, M.D.    Dg Hip Complete Left  01/20/2012  *RADIOLOGY REPORT*  Clinical Data: Fall and hip pain.  LEFT HIP - COMPLETE 2+ VIEW  Comparison: Pelvic CT 12/25/2011  Findings: The left hip is located on the cross-table lateral views. There is a left intertrochanteric femur fracture.  The lesser trochanter fragment is displaced.  There is medial joint space narrowing in both hips.  Right hip appears to be grossly intact. Spinal surgery in the lower lumbar spine.  Pelvic bony ring is intact.  IMPRESSION: Left femoral intertrochanteric fracture.  Bilateral hip osteoarthritis.   Original Report Authenticated By: Richarda Overlie, M.D.    Ct Head Wo Contrast  01/20/2012  *RADIOLOGY REPORT*  Clinical Data:  Fall, dizziness.  CT HEAD WITHOUT CONTRAST  Technique:  Contiguous axial images were obtained from the base of the skull through the vertex without contrast.  Comparison: 10/08/2010  Findings: Prominence of the sulci, cisterns, and ventricles, in keeping with volume loss. There are subcortical and periventricular white matter hypodensities, a nonspecific finding most often seen with chronic microangiopathic changes.  There is no  evidence for acute  hemorrhage, overt hydrocephalus, mass lesion, or abnormal extra-axial fluid collection.  No definite CT evidence for acute cortical based (large artery) infarction. Partially opacified right maxillary sinus with mixed air/debris. Paranasal sinuses and mastoid air cells are otherwise predominately clear.  No displaced calvarial fracture.  IMPRESSION: White matter changes and volume loss as above.  No CT evidence of acute intracranial abnormality.  Right maxillary sinus opacification.  Correlate with clinical symptoms for acute sinusitis.   Original Report Authenticated By: Waneta Martins, M.D.     Review of Systems  All other systems reviewed and are negative.    Blood pressure 142/59, pulse 80, temperature 98.9 F (37.2 C), temperature source Axillary, resp. rate 16, height 5\' 7"  (1.702 m), weight 77.61 kg (171 lb 1.6 oz), SpO2 96.00%. Physical Exam  On examination patient has a palpable dorsalis pedis pulse on the left she has pain with internal and external rotation of the left hip her left lower sternal he is shortened and externally rotated. Radiograph shows a displaced left intertrochanteric hip fracture. Assessment/Plan Assessment: Left intertrochanteric hip fracture displaced.  Plan: Will plan for intramedullary nail fixation. Risks and benefits were discussed with the patient and her son including infection neurovascular injury fracture inability to walk DVT pulmonary embolus need for additional surgery. Patient and her son state they understand and wish to proceed at this time.  Anjelica Gorniak V 01/21/2012, 5:28 PM

## 2012-01-21 NOTE — Anesthesia Postprocedure Evaluation (Signed)
  Anesthesia Post-op Note  Patient: Brittney Gates  Procedure(s) Performed: Procedure(s) (LRB) with comments: INTRAMEDULLARY (IM) NAIL FEMORAL (Left) - Intertrochanteric Nail Left Hip  Patient Location: PACU  Anesthesia Type: General  Level of Consciousness: awake, alert  and oriented  Airway and Oxygen Therapy: Patient Spontanous Breathing  Post-op Pain: mild  Post-op Assessment: Post-op Vital signs reviewed  Post-op Vital Signs: Reviewed  Complications: No apparent anesthesia complications

## 2012-01-21 NOTE — Progress Notes (Signed)
Utilization review completed. Alina Gilkey, RN, BSN. 

## 2012-01-21 NOTE — Progress Notes (Signed)
[facility]                                                                                Patient Demographics  Brittney Gates, is a 78 y.o. female  AOZ:[id number]  NGE:[id number]  DOB - [birth year]  Admit date - 01/20/2012  Admitting Physician Tarry Kos, MD  Outpatient Primary MD for the patient is Rene Paci, MD  LOS - 1   Chief Complaint  Patient presents with  . Fall  . Hip Pain        Assessment & Plan    1. Trip Fall with L. intertrochanteric femoral fracture - case discussed with patient and her son, they understands the risks and benefits and want to proceed for surgery, have requested Dr. Lajoyce Corners to see the patient, likely surgery today afternoon, per patient and family request Dr. Excell Seltzer cardiologist also recommended to see the patient.  Cardio-Pulm Risk stratification for surgery and recommendations to minimize the same:-  A.Cardio-Pulmonary Risk -  this patient is a moderate to high for adverse Cardio-Pulmonary  Outcome  from surgery, the risks and benefits were discussed and acceptable to patient and her son  Recommendations for optimizing Cardio-Pulmonary  Risk risk factors  1. Keep SBP<140, HR<85, use Lopressor 5mg  IV q4hrs PRN, or B.Blocker drip PRN. 2. Tight I&O goal to keep even. 3. Minimal sedation. 4. Good pulmunary toilet. 5. Scheduled/PRN Nebs ordered keep Pox>90% 6. Hb>8, transfuse as needed- Lasix 10mg  IV after each unit PRBC Transfused.   B.Bleeding Risk - no previous surgical complications, no easy bruising, INR 1.06-, Platecount 162     Will request Surgeon to please Order Lovenox/DVT prophylaxis of his/her choice when OK from Surgeon's standpoint post op.     2. History of hypothyroidism. No acute issues home dose Synthroid to be continued.    3. H/O dyslipidemia. Continue Zetia then outpatient followup by PCP.     4. HYPERTENSION - stable for now, will place her on low-dose beta blocker, when necessary IV  Lopressor also I ordered and will monitor.      5. Mild leukocytosis. Likely reactionary from hip fracture, patient is afebrile, chest x-ray stable, will check a UA and monitor.     Code Status: Full  Family Communication: Discussed with patient and her son  Disposition Plan: To be decided    Procedures open reduction internal fixation of left femur scheduled for 01/21/2012 by Dr. Lajoyce Corners   Consults  orthopedics Dr. Lajoyce Corners   Time Spent in minutes   35   Antibiotics    Anti-infectives     Start     Dose/Rate Route Frequency Ordered Stop   01/21/12 1100   ceFAZolin (ANCEF) 3 g in dextrose 5 % 50 mL IVPB        3 g 160 mL/hr over 30 Minutes Intravenous 60 min pre-op 01/21/12 1048            Scheduled Meds:    . sodium chloride   Intravenous STAT  .  ceFAZolin (ANCEF) IV  3 g Intravenous 60 min Pre-Op  . chlorhexidine  60 mL Topical Once  . fentaNYL  50 mcg Intravenous Once  . fentaNYL  50 mcg Intravenous Once  . levothyroxine  125 mcg Oral Daily  . LORazepam  1 mg Oral Once  . magnesium sulfate 1 - 4 g bolus IVPB  1 g Intravenous Once  . metoprolol tartrate  25 mg Oral BID  . ondansetron  4 mg Intravenous Once  . ondansetron (ZOFRAN) IV  4 mg Intravenous Once  . ondansetron (ZOFRAN) IV  4 mg Intravenous Once  . zolpidem  5 mg Oral QHS  . DISCONTD: fentaNYL  50 mcg Intravenous Once  . DISCONTD: ondansetron  4 mg Intravenous Once   Continuous Infusions:    . 0.9 % NaCl with KCl 20 mEq / L 75 mL/hr at 01/21/12 0234   PRN Meds:.LORazepam, metoprolol, morphine injection, ondansetron (ZOFRAN) IV, ondansetron, polyvinyl alcohol, DISCONTD: Polyethyl Glycol-Propyl Glycol   DVT Prophylaxis  SCDs preop  Lab Results  Component Value Date   PLT 162 01/21/2012      Susa Raring K M.D on 01/21/2012 at 11:07 AM  Between 7am to 7pm - Pager - [phone]  After 7pm go to [email or web address] - password [email or web address]  And look for the night coverage person covering for me  after hours  [facility] Office  [phone]    Subjective:   Brittney Gates today has, No headache, No chest pain, No abdominal pain - No Nausea, No new weakness tingling or numbness, No Cough - SOB. Mild soreness in the left hip area  Objective:   Filed Vitals:   01/20/12 2330 01/21/12 0000 01/21/12 0100 01/21/12 0538  BP: 143/59 142/63 153/66 130/62  Pulse: 92 88 90 91  Temp:   98.6 F (37 C) 98 F (36.7 C)  TempSrc:   Oral Axillary  Resp: 20 20 20 16   Height:   5\' 7"  (1.702 m)   Weight:   77.61 kg (171 lb 1.6 oz)   SpO2: 96% 98% 98% 92%    Wt Readings from Last 3 Encounters:  01/21/12 77.61 kg (171 lb 1.6 oz)  12/11/11 79.379 kg (175 lb)  10/27/11 80.74 kg (178 lb)     Intake/Output Summary (Last 24 hours) at 01/21/12 1107 Last data filed at 01/21/12 0800  Gross per 24 hour  Intake    950 ml  Output   1000 ml  Net    -50 ml    Exam Awake Alert, Oriented X 3, No new F.N deficits, Normal affect [place].AT,PERRAL Supple Neck,No JVD, No cervical lymphadenopathy appriciated.  Symmetrical Chest wall movement, Good air movement bilaterally, CTAB RRR,No Gallops,Rubs or new Murmurs, No Parasternal Heave +ve B.Sounds, Abd Soft, Non tender, No organomegaly appriciated, No rebound - guarding or rigidity. No Cyanosis, Clubbing or edema, No new Rash or bruise, left leg is externally rotated   Data Review   Micro Results No results found for this or any previous visit (from the past 240 hour(s)).  Radiology Reports Dg Chest 2 View  01/20/2012  *RADIOLOGY REPORT*  Clinical Data: Larey Seat at home  CHEST - 2 VIEW  Comparison: Chest x-ray of 02/24/2010  Findings: No active infiltrate or effusion is seen.  Mediastinal contours are stable.  The heart is mildly enlarged and stable.  The bones are osteopenic and there is diffuse degenerative change throughout the thoracic spine.  IMPRESSION: Stable mild cardiomegaly.  No active lung disease.   Original Report Authenticated  By: Juline Patch, M.D.    Dg Hip Complete Left  01/20/2012  *RADIOLOGY REPORT*  Clinical Data: Fall and hip pain.  LEFT HIP - COMPLETE  2+ VIEW  Comparison: Pelvic CT 12/25/2011  Findings: The left hip is located on the cross-table lateral views. There is a left intertrochanteric femur fracture.  The lesser trochanter fragment is displaced.  There is medial joint space narrowing in both hips.  Right hip appears to be grossly intact. Spinal surgery in the lower lumbar spine.  Pelvic bony ring is intact.  IMPRESSION: Left femoral intertrochanteric fracture.  Bilateral hip osteoarthritis.   Original Report Authenticated By: Richarda Overlie, M.D.    Ct Head Wo Contrast  01/20/2012  *RADIOLOGY REPORT*  Clinical Data:  Fall, dizziness.  CT HEAD WITHOUT CONTRAST  Technique:  Contiguous axial images were obtained from the base of the skull through the vertex without contrast.  Comparison: 10/08/2010  Findings: Prominence of the sulci, cisterns, and ventricles, in keeping with volume loss. There are subcortical and periventricular white matter hypodensities, a nonspecific finding most often seen with chronic microangiopathic changes.  There is no evidence for acute hemorrhage, overt hydrocephalus, mass lesion, or abnormal extra-axial fluid collection.  No definite CT evidence for acute cortical based (large artery) infarction. Partially opacified right maxillary sinus with mixed air/debris. Paranasal sinuses and mastoid air cells are otherwise predominately clear.  No displaced calvarial fracture.  IMPRESSION: White matter changes and volume loss as above.  No CT evidence of acute intracranial abnormality.  Right maxillary sinus opacification.  Correlate with clinical symptoms for acute sinusitis.   Original Report Authenticated By: Waneta Martins, M.D.         CBC  Lab 01/21/12 0545 01/20/12 2059  WBC 11.6* 15.2*  HGB 9.3* 10.8*  HCT 27.5* 31.5*  PLT 162 197  MCV 97.9 97.5  MCH 33.1 33.4  MCHC 33.8 34.3    RDW 17.9* 18.0*  LYMPHSABS -- 2.3  MONOABS -- 0.7  EOSABS -- 0.1  BASOSABS -- 0.1  BANDABS -- --    Chemistries   Lab 01/21/12 0545 01/20/12 2059  NA 139 135  K 3.9 3.7  CL 104 101  CO2 25 23  GLUCOSE 109* 124*  BUN 18 21  CREATININE 0.89 0.89  CALCIUM 8.5 9.0  MG -- --  AST -- --  ALT -- --  ALKPHOS -- --  BILITOT -- --   ------------------------------------------------------------------------------------------------------------------ estimated creatinine clearance is 49.6 ml/min (by C-G formula based on Cr of 0.89). ------------------------------------------------------------------------------------------------------------------ No results found for this basename: HGBA1C:2 in the last 72 hours ------------------------------------------------------------------------------------------------------------------ No results found for this basename: CHOL:2,HDL:2,LDLCALC:2,TRIG:2,CHOLHDL:2,LDLDIRECT:2 in the last 72 hours ------------------------------------------------------------------------------------------------------------------ No results found for this basename: TSH,T4TOTAL,FREET3,T3FREE,THYROIDAB in the last 72 hours ------------------------------------------------------------------------------------------------------------------ No results found for this basename: VITAMINB12:2,FOLATE:2,FERRITIN:2,TIBC:2,IRON:2,RETICCTPCT:2 in the last 72 hours  Coagulation profile  Lab 01/21/12 0545  INR 1.06  PROTIME --    No results found for this basename: DDIMER:2 in the last 72 hours  Cardiac Enzymes No results found for this basename: CK:3,CKMB:3,TROPONINI:3,MYOGLOBIN:3 in the last 168 hours ------------------------------------------------------------------------------------------------------------------ No components found with this basename: POCBNP:3

## 2012-01-21 NOTE — Anesthesia Preprocedure Evaluation (Signed)
Anesthesia Evaluation  Patient identified by MRN, date of birth, ID band Patient awake    Reviewed: Allergy & Precautions, H&P , NPO status , Patient's Chart, lab work & pertinent test results, reviewed documented beta blocker date and time   Airway Mallampati: II TM Distance: >3 FB Neck ROM: Full    Dental  (+) Teeth Intact, Caps, Implants and Dental Advisory Given   Pulmonary          Cardiovascular hypertension, Pt. on medications     Neuro/Psych    GI/Hepatic   Endo/Other  Hypothyroidism Pt reports that she was told "years ago" that she might be borderline diabetic.  Since then, all blood work normal per patient.  Renal/GU      Musculoskeletal   Abdominal   Peds  Hematology   Anesthesia Other Findings   Reproductive/Obstetrics                           Anesthesia Physical Anesthesia Plan  ASA: III  Anesthesia Plan: General   Post-op Pain Management:    Induction: Intravenous  Airway Management Planned: Oral ETT  Additional Equipment:   Intra-op Plan:   Post-operative Plan: Extubation in OR  Informed Consent: I have reviewed the patients History and Physical, chart, labs and discussed the procedure including the risks, benefits and alternatives for the proposed anesthesia with the patient or authorized representative who has indicated his/her understanding and acceptance.   Dental advisory given  Plan Discussed with: Anesthesiologist and Surgeon  Anesthesia Plan Comments:         Anesthesia Quick Evaluation

## 2012-01-22 DIAGNOSIS — Z01810 Encounter for preprocedural cardiovascular examination: Secondary | ICD-10-CM | POA: Diagnosis not present

## 2012-01-22 DIAGNOSIS — D649 Anemia, unspecified: Secondary | ICD-10-CM

## 2012-01-22 DIAGNOSIS — W19XXXA Unspecified fall, initial encounter: Secondary | ICD-10-CM | POA: Diagnosis not present

## 2012-01-22 DIAGNOSIS — M129 Arthropathy, unspecified: Secondary | ICD-10-CM | POA: Diagnosis not present

## 2012-01-22 DIAGNOSIS — S72143A Displaced intertrochanteric fracture of unspecified femur, initial encounter for closed fracture: Secondary | ICD-10-CM | POA: Diagnosis not present

## 2012-01-22 LAB — IRON AND TIBC
Iron: 15 ug/dL — ABNORMAL LOW (ref 42–135)
Saturation Ratios: 8 % — ABNORMAL LOW (ref 20–55)
TIBC: 199 ug/dL — ABNORMAL LOW (ref 250–470)
UIBC: 184 ug/dL (ref 125–400)

## 2012-01-22 LAB — CBC
HCT: 21.8 % — ABNORMAL LOW (ref 36.0–46.0)
Hemoglobin: 7.4 g/dL — ABNORMAL LOW (ref 12.0–15.0)
MCH: 33.9 pg (ref 26.0–34.0)
MCHC: 33.9 g/dL (ref 30.0–36.0)
MCV: 100 fL (ref 78.0–100.0)
Platelets: 144 10*3/uL — ABNORMAL LOW (ref 150–400)
RBC: 2.18 MIL/uL — ABNORMAL LOW (ref 3.87–5.11)
RDW: 18.7 % — ABNORMAL HIGH (ref 11.5–15.5)
WBC: 12.4 10*3/uL — ABNORMAL HIGH (ref 4.0–10.5)

## 2012-01-22 LAB — BASIC METABOLIC PANEL
BUN: 18 mg/dL (ref 6–23)
CO2: 26 mEq/L (ref 19–32)
Calcium: 7.9 mg/dL — ABNORMAL LOW (ref 8.4–10.5)
Chloride: 103 mEq/L (ref 96–112)
Creatinine, Ser: 0.95 mg/dL (ref 0.50–1.10)
GFR calc Af Amer: 62 mL/min — ABNORMAL LOW (ref >90)
GFR calc non Af Amer: 53 mL/min — ABNORMAL LOW (ref >90)
Glucose, Bld: 129 mg/dL — ABNORMAL HIGH (ref 70–99)
Potassium: 4.2 mEq/L (ref 3.5–5.1)
Sodium: 137 mEq/L (ref 135–145)

## 2012-01-22 LAB — ABO/RH: ABO/RH(D): O POS

## 2012-01-22 LAB — PROTIME-INR
INR: 1.19 (ref 0.00–1.49)
Prothrombin Time: 14.9 seconds (ref 11.6–15.2)

## 2012-01-22 LAB — GLUCOSE, CAPILLARY
Glucose-Capillary: 116 mg/dL — ABNORMAL HIGH (ref 70–99)
Glucose-Capillary: 118 mg/dL — ABNORMAL HIGH (ref 70–99)
Glucose-Capillary: 124 mg/dL — ABNORMAL HIGH (ref 70–99)
Glucose-Capillary: 144 mg/dL — ABNORMAL HIGH (ref 70–99)

## 2012-01-22 LAB — VITAMIN B12: Vitamin B-12: 497 pg/mL (ref 211–911)

## 2012-01-22 LAB — PREPARE RBC (CROSSMATCH)

## 2012-01-22 LAB — RETICULOCYTES
RBC.: 2.08 MIL/uL — ABNORMAL LOW (ref 3.87–5.11)
Retic Count, Absolute: 41.6 10*3/uL (ref 19.0–186.0)
Retic Ct Pct: 2 % (ref 0.4–3.1)

## 2012-01-22 LAB — FOLATE: Folate: >20 ng/mL

## 2012-01-22 LAB — FERRITIN: Ferritin: 173 ng/mL (ref 10–291)

## 2012-01-22 LAB — MAGNESIUM: Magnesium: 2.1 mg/dL (ref 1.5–2.5)

## 2012-01-22 MED ORDER — PROMETHAZINE HCL 25 MG/ML IJ SOLN: 12.5000 mg | Freq: Four times a day (QID) | INTRAMUSCULAR | Status: DC | PRN

## 2012-01-22 MED ORDER — ACETAMINOPHEN 325 MG PO TABS
650.0000 mg | ORAL_TABLET | Freq: Once | ORAL | Status: AC
  Administered 2012-01-22: 650 mg via ORAL
  Filled 2012-01-22: qty 2

## 2012-01-22 MED ORDER — DEXTROSE 5 % IV SOLN
1.0000 g | INTRAVENOUS | Status: DC
  Administered 2012-01-22 – 2012-01-23 (×2): 1 g via INTRAVENOUS
  Filled 2012-01-22 (×2): qty 10

## 2012-01-22 MED ORDER — WARFARIN SODIUM 4 MG PO TABS
4.0000 mg | ORAL_TABLET | Freq: Once | ORAL | Status: AC
  Administered 2012-01-22: 4 mg via ORAL
  Filled 2012-01-22: qty 1

## 2012-01-22 MED ORDER — DIPHENHYDRAMINE HCL 50 MG/ML IJ SOLN
12.5000 mg | Freq: Once | INTRAMUSCULAR | Status: AC
  Administered 2012-01-22: 12.5 mg via INTRAVENOUS
  Filled 2012-01-22: qty 1

## 2012-01-22 NOTE — Progress Notes (Signed)
Patient ID: Brittney Gates, female   DOB: 1925-02-06, 78 y.o.   MRN: [id number] Postoperative day 1 internal fixation intertrochanteric left hip fracture. Patient is comfortable and alert this morning. Will plan for discharge to skilled nursing. This was discussed with her son this morning and he is in agreement.

## 2012-01-22 NOTE — Progress Notes (Signed)
Pt temp was elevated after the blood transfusion began, at the 15 minute vital sign check.  Her temp was 97.7 and after 15 mins, temp was 100.4.  Blood reaction protocol was initiated.  The blood transfusion was stopped, MD notified.  Blood reaction lab was drawn, which was negative.  Dr. Thedore Mins said to proceed with blood transfusion and to premedicated with tylenol & benadryl.  Nsg to continue to monitor.

## 2012-01-22 NOTE — Clinical Documentation Improvement (Signed)
Abnormal Labs Clarification  THIS DOCUMENT IS NOT A PERMANENT PART OF THE MEDICAL RECORD  TO RESPOND TO THE THIS QUERY, FOLLOW THE INSTRUCTIONS BELOW:  1. If needed, update documentation for the patient's encounter via the notes activity.  2. Access this query again and click edit on the [employer].  3. After updating, or not, click F2 to complete all highlighted (required) fields concerning your review. Select "additional documentation in the medical record" OR "no additional documentation provided".  4. Click Sign note button.  5. The deficiency will fall out of your InBasket *Please let us know if you are not able to complete this workflow by phone or e-mail (listed below).  Please update your documentation within the medical record to reflect your response to this query.                                                                                   01/22/12  Dear Dr. Burney Gauze Marton Redwood  In a better effort to capture your patient's severity of illness, reflect appropriate length of stay and utilization of resources, a review of the medical record has revealed the following indicators.    Based on your clinical judgment, please clarify and document in a progress note and/or discharge summary the clinical condition associated with the following supporting information:  In responding to this query please exercise your independent judgment.  The fact that a query is asked, does not imply that any particular answer is desired or expected.  Abnormal findings (laboratory, x-ray, pathologic, and other diagnostic results) are not coded and reported unless the physician indicates their clinical significance.   The medical record reflects the following clinical findings, please clarify the diagnostic and/or clinical significance:      Noted urinalysis grew > 100,0000 colonies of E coli  sensitivy report  pending.  Please document secondary diagnosis if appropriate.  Thank you    Possible Clinical Conditions?                          Bacturia  UTI  Other Condition           Cannot Clinically Determine   Treatment: recently completed Rocephin 1g and Ancef IV 2g prophylactic doses  Evaluation :  Urine cx pending   Reviewed: 01/23/12 - "ecoli UTI documented in dc summary by Dr. Susie Cassette. Mathis Dad RN    Thank You,  Leonette Most Addison  Clinical Documentation Specialist RN, BSN: Pager [phone] HIM of # [phone]  Health Information Management [place]

## 2012-01-22 NOTE — Clinical Documentation Improvement (Signed)
Anemia Documentation Clarification Query  THIS DOCUMENT IS NOT A PERMANENT PART OF THE MEDICAL RECORD  RESPOND TO THE THIS QUERY, FOLLOW THE INSTRUCTIONS BELOW:  1. If needed, update documentation for the patient's encounter via the notes activity.  2. Access this query again and click edit on the In [employer].  3. After updating, or not, click F2 to complete all highlighted (required) fields concerning your review. Select "additional documentation in the medical record" OR "no additional documentation provided".  4. Click Sign note button.  5. The deficiency will fall out of your In Basket *Please let us know if you are not able to complete this workflow by phone or e-mail (listed below).          01/22/12  Dear Dr.  Burney Gauze Marton Redwood  In an effort to better capture your patient's severity of illness, reflect appropriate length of stay and utilization of resources, a review of the patient medical record has revealed the following indicators.    Based on your clinical judgment, please clarify and document in a progress note and/or discharge summary the clinical condition associated with the following supporting information:  In responding to this query please exercise your independent judgment.  The fact that a query is asked, does not imply that any particular answer is desired or expected.  Noted patient's admission H/H 10.8/31.5 and post op 7.4/21.8 with an EBL of 100 ml.  Please document appropriate secondary diagnosis if known.  Thank you  Possible Clinical Conditions?  " Iron deficiency anemia  " Chronic blood loss anemia  " Anemia due to chronic disease  " Acute Blood Loss Anemia  " Precipitous drop in Hematocrit  " Other Condition  " Cannot Clinically Determine      Supporting Information: s/p ORIF of lt IT femoral fx , h/o anemia  IV fluids:  1000 ml LR in OR  Serial H&H monitoring: CBC 10/24  You may use possible, probable, or suspect with  inpatient documentation. Possible, probable, suspected diagnoses MUST be documented at the time of discharge.  Reviewed: mild anemia do to perioperative blood loss doc in pn 10/24  Thank You,  Leonette Most Jamari Diana  Clinical Documentation Specialist RN, BSN: Pager  [phone] HIM off # [phone]  Health Information Management [place]

## 2012-01-22 NOTE — Progress Notes (Signed)
Patient ID: Brittney Gates, female   DOB: 07-Sep-1924, 78 y.o.   MRN: [id number]    Subjective:  Denies SSCP, palpitations or Dyspnea   Objective:  Filed Vitals:   01/21/12 1945 01/21/12 1955 01/21/12 2014 01/22/12 0559  BP: 119/54  103/51 101/54  Pulse: 78 65 68 71  Temp:  98.1 F (36.7 C) 97.3 F (36.3 C) 98 F (36.7 C)  TempSrc:      Resp: 23 13 16 16   Height:      Weight:      SpO2: 100% 100% 96% 100%    Intake/Output from previous day:  Intake/Output Summary (Last 24 hours) at 01/22/12 0831 Last data filed at 01/22/12 0549  Gross per 24 hour  Intake 1222.67 ml  Output   1135 ml  Net  87.67 ml    Physical Exam: Affect appropriate Elderly frail female HEENT: normal Neck supple with no adenopathy JVP normal no bruits no thyromegaly Lungs clear with no wheezing and good diaphragmatic motion Heart:  S1/S2 SEM  murmur, no rub, gallop or click PMI normal Abdomen: benighn, BS positve, no tenderness, no AAA no bruit.  No HSM or HJR Distal pulses intact with no bruits No edema Neuro non-focal Skin warm and dry No muscular weakness S/P surgery left hip fracture   Lab Results: Basic Metabolic Panel:  Basename 01/22/12 0600 01/21/12 0545  NA 137 139  K 4.2 3.9  CL 103 104  CO2 26 25  GLUCOSE 129* 109*  BUN 18 18  CREATININE 0.95 0.89  CALCIUM 7.9* 8.5  MG 2.1 --  PHOS -- --   Liver Function Tests: No results found for this basename: AST:2,ALT:2,ALKPHOS:2,BILITOT:2,PROT:2,ALBUMIN:2 in the last 72 hours No results found for this basename: LIPASE:2,AMYLASE:2 in the last 72 hours CBC:  Basename 01/22/12 0600 01/21/12 0545 01/20/12 2059  WBC 12.4* 11.6* --  NEUTROABS -- -- 12.1*  HGB 7.4* 9.3* --  HCT 21.8* 27.5* --  MCV 100.0 97.9 --  PLT 144* 162 --    Imaging: Dg Chest 2 View  01/20/2012  *RADIOLOGY REPORT*  Clinical Data: Lare[place] Seat at home  CHEST - 2 VIEW  Comparison: Chest x-ray of 02/24/2010  Findings: No active infiltrate or effusion is seen.   Mediastinal contours are stable.  The heart is mildly enlarged and stable.  The bones are osteopenic and there is diffuse degenerative change throughout the thoracic spine.  IMPRESSION: Stable mild cardiomegaly.  No active lung disease.   Original Report Authenticated By: Juline Patch, M.D.    Dg Hip Complete Left  01/20/2012  *RADIOLOGY REPORT*  Clinical Data: Fall and hip pain.  LEFT HIP - COMPLETE 2+ VIEW  Comparison: Pelvic CT 12/25/2011  Findings: The left hip is located on the cross-table lateral views. There is a left intertrochanteric femur fracture.  The lesser trochanter fragment is displaced.  There is medial joint space narrowing in both hips.  Right hip appears to be grossly intact. Spinal surgery in the lower lumbar spine.  Pelvic bony ring is intact.  IMPRESSION: Left femoral intertrochanteric fracture.  Bilateral hip osteoarthritis.   Original Report Authenticated By: Richarda Overlie, M.D.    Dg Hip Operative Left  01/21/2012  *RADIOLOGY REPORT*  Clinical Data: Left hip pinning.  OPERATIVE LEFT HIP  Comparison: 01/20/2012.  Findings: Two intraoperative views of the left hip submitted for review after surgery.  This single projection reveals reduction of left intratrochanteric fracture with a dynamic sliding type screw which appears in satisfactory position on  this single projection.  IMPRESSION: Open reduction and internal fixation of left intratrochanteric fracture.   Original Report Authenticated By: Fuller Canada, M.D.    Ct Head Wo Contrast  01/20/2012  *RADIOLOGY REPORT*  Clinical Data:  Fall, dizziness.  CT HEAD WITHOUT CONTRAST  Technique:  Contiguous axial images were obtained from the base of the skull through the vertex without contrast.  Comparison: 10/08/2010  Findings: Prominence of the sulci, cisterns, and ventricles, in keeping with volume loss. There are subcortical and periventricular white matter hypodensities, a nonspecific finding most often seen with chronic  microangiopathic changes.  There is no evidence for acute hemorrhage, overt hydrocephalus, mass lesion, or abnormal extra-axial fluid collection.  No definite CT evidence for acute cortical based (large artery) infarction. Partially opacified right maxillary sinus with mixed air/debris. Paranasal sinuses and mastoid air cells are otherwise predominately clear.  No displaced calvarial fracture.  IMPRESSION: White matter changes and volume loss as above.  No CT evidence of acute intracranial abnormality.  Right maxillary sinus opacification.  Correlate with clinical symptoms for acute sinusitis.   Original Report Authenticated By: Waneta Martins, M.D.     Cardiac Studies:  ECG: 10/22  SR rate 86 normal   Echo: 2011  EF 55-60% reviewed  Medications:     . sodium chloride   Intravenous STAT  .  ceFAZolin (ANCEF) IV  3 g Intravenous 60 min Pre-Op  .  ceFAZolin (ANCEF) IV  2 g Intravenous Q6H  . chlorhexidine  60 mL Topical Once  . coumadin book   Does not apply Once  . levothyroxine  125 mcg Oral Daily  . magnesium sulfate 1 - 4 g bolus IVPB  1 g Intravenous Once  . metoprolol tartrate  25 mg Oral BID  . warfarin  4 mg Oral Once  . warfarin   Does not apply Once  . Warfarin - Pharmacist Dosing Inpatient   Does not apply q1800  . zolpidem  5 mg Oral QHS       . sodium chloride 20 mL/hr at 01/21/12 2341  . 0.9 % NaCl with KCl 20 mEq / L 75 mL/hr at 01/21/12 0234    Assessment/Plan:  Chest Pain:  Resolved previous negative w/u for CAD  No post op complications or arrhythmias Stable for  Placement to [facility] for rehab post hip fracture  Coumadin for DVT prophylaxis  Brittney Gates 01/22/2012, 8:31 AM

## 2012-01-22 NOTE — Clinical Social Work Placement (Addendum)
    Clinical Social Work Department CLINICAL SOCIAL WORK PLACEMENT NOTE 01/22/2012  Patient:  KARALINE, WIEBKE  Account Number:  [id number] Admit date:  01/20/2012  Clinical Social Worker:  Lupita Leash Aara Jacquot, LCSWA  Date/time:  01/21/2012 04:00 PM  Clinical Social Work is seeking post-discharge placement for this patient at the following level of care:   SKILLED NURSING   (*CSW will update this form in Epic as items are completed)   01/21/2012  Patient/family provided with [facility] Department of Clinical Social Work's list of facilities offering this level of care within the geographic area requested by the patient (or if unable, by the patient's family).  01/21/2012  Patient/family informed of their freedom to choose among providers that offer the needed level of care, that participate in Medicare, Medicaid or managed care program needed by the patient, have an available bed and are willing to accept the patient.  01/21/2012  Patient/family informed of [facility]' ownership interest in [facility], as well as of the fact that they are under no obligation to receive care at this facility.  PASARR submitted to EDS on 01/21/2012 PASARR number received from EDS on 01/21/2012  FL2 transmitted to all facilities in geographic area requested by pt/family on  01/22/12 FL2 transmitted to all facilities within larger geographic area on   Patient informed that his/her managed care company has contracts with or will negotiate with  certain facilities, including the following:  NA   Patient/family informed of bed offers received: 01/22/12 Patient chooses bed at [facility] Physician recommends and patient chooses bed at   NA  Patient to be transferred to [facility] on   Patient to be transferred to facility by Ambulance [facility])  The following physician request were entered in Epic:   Additional Comments: Awaiting date of stability per MD. Bed will be ready at [facility]  tomorrow 01/23/12. Patient is pleased.  Lorri Frederick. West Pugh  [phone]   01/23/12  OK for d/c to [facility] today. Patient and son [name] are aware of d/c.  Notified SNF and pt's nurse- Ivar Drape of d/c. No further CSW needs identified; signing off.  Lorri Frederick. West Pugh  [phone]

## 2012-01-22 NOTE — Progress Notes (Addendum)
[facility]                                                                                Patient Demographics  Brittney Gates, is a 78 y.o. female  ZOX:[id number]  WJX:[id number]  DOB - [birth year]  Admit date - 01/20/2012  Admitting Physician Tarry Kos, MD  Outpatient Primary MD for the patient is Rene Paci, MD  LOS - 2   Chief Complaint  Patient presents with  . Fall  . Hip Pain        Assessment & Plan   78 year old female admitted after trip and fall left femoral fracture, status post open reduction internal fixation by Dr. Aldean Baker on 01/21/2012, patient tolerated the procedure well, she was seen by cardiology prior to surgery per family request, she symptom-free, she has developed mild anemia do to perioperative blood loss, anemia panel has been ordered, type screen has been ordered, repeat H&H in the morning. Patient is symptom free for now. She is getting Coumadin and SCDs for DVT prophylaxis per orthopedics.     1. Trip Fall with L. intertrochanteric femoral fracture - she is status post open reduction internal fixation of the left hip by Dr. Lajoyce Corners on 01/21/2012, tolerated the procedure well except for blood loss related anemia, we'll continue to monitor, PT and DVT prophylaxis per orthopedics currently on SCDs and Coumadin pharmacy.  Lab Results  Component Value Date   INR 1.19 01/22/2012   INR 1.06 01/21/2012       2. History of hypothyroidism. No acute issues home dose Synthroid to be continued.    3. H/O dyslipidemia. Continue Zetia then outpatient followup by PCP.     4. HYPERTENSION - stable for now, will place her on low-dose beta blocker, when necessary IV Lopressor also I ordered and will monitor.      5. Mild leukocytosis. Likely reactionary from hip fracture, patient is afebrile, chest x-ray stable, UA is borderline for UTI, will give her Rocephin for 3 doses, monitor urine cultures.     6. Anemia  accommodation of that loss during surgery and dilution from IV fluids. Threshold for transfusion will be 7 or symptoms, patient is currently symptom-free, will order anemia panel, type screen, repeat H&H in the morning.   Addendum - at 2.45 pm pt SBP 90s, slightly dizzy, will transfuse 1 unit PRBC.     Code Status: Full  Family Communication: Discussed with patient and her son  Disposition Plan: To be decided    Procedures open reduction internal fixation of left femur scheduled for 01/21/2012 by Dr. Lajoyce Corners   Consults  orthopedics Dr. Lajoyce Corners, cardiology per family request preop.   Time Spent in minutes   35   Antibiotics    Anti-infectives     Start     Dose/Rate Route Frequency Ordered Stop   01/22/12 1100   cefTRIAXone (ROCEPHIN) 1 g in dextrose 5 % 50 mL IVPB        1 g 100 mL/hr over 30 Minutes Intravenous Every 24 hours 01/22/12 1054 01/25/12 1059   01/21/12 2045   ceFAZolin (ANCEF) IVPB 2 g/50 mL premix        2  g 100 mL/hr over 30 Minutes Intravenous Every 6 hours 01/21/12 2039 01/22/12 0936   01/21/12 1100   ceFAZolin (ANCEF) 3 g in dextrose 5 % 50 mL IVPB        3 g 160 mL/hr over 30 Minutes Intravenous 60 min pre-op 01/21/12 1048 01/21/12 1701          Scheduled Meds:    .  ceFAZolin (ANCEF) IV  3 g Intravenous 60 min Pre-Op  .  ceFAZolin (ANCEF) IV  2 g Intravenous Q6H  . cefTRIAXone (ROCEPHIN)  IV  1 g Intravenous Q24H  . chlorhexidine  60 mL Topical Once  . coumadin book   Does not apply Once  . levothyroxine  125 mcg Oral Daily  . magnesium sulfate 1 - 4 g bolus IVPB  1 g Intravenous Once  . metoprolol tartrate  25 mg Oral BID  . warfarin  4 mg Oral Once  . warfarin   Does not apply Once  . Warfarin - Pharmacist Dosing Inpatient   Does not apply q1800  . zolpidem  5 mg Oral QHS   Continuous Infusions:    . sodium chloride 20 mL/hr at 01/21/12 2341  . 0.9 % NaCl with KCl 20 mEq / L 75 mL/hr at 01/21/12 0234   PRN  Meds:.HYDROcodone-acetaminophen, HYDROmorphone (DILAUDID) injection, LORazepam, metoCLOPramide (REGLAN) injection, metoCLOPramide, metoprolol, morphine injection, ondansetron (ZOFRAN) IV, ondansetron (ZOFRAN) IV, ondansetron, ondansetron, oxyCODONE-acetaminophen, polyvinyl alcohol, DISCONTD: 0.9 % irrigation (POUR BTL), DISCONTD:  HYDROmorphone (DILAUDID) injection   DVT Prophylaxis  SCDs preop  Lab Results  Component Value Date   PLT 144* 01/22/2012      Susa Raring K M.D on 01/22/2012 at 10:55 AM  Between 7am to 7pm - Pager - [phone]  After 7pm go to [email or web address] - password [email or web address]  And look for the night coverage person covering for me after hours  [facility] Office  [phone]    Subjective:   Brittney Gates today has, No headache, No chest pain, No abdominal pain - No Nausea, No new weakness tingling or numbness, No Cough - SOB. Mild soreness in the left hip area.  Objective:   Filed Vitals:   01/21/12 1945 01/21/12 1955 01/21/12 2014 01/22/12 0559  BP: 119/54  103/51 101/54  Pulse: 78 65 68 71  Temp:  98.1 F (36.7 C) 97.3 F (36.3 C) 98 F (36.7 C)  TempSrc:      Resp: 23 13 16 16   Height:      Weight:      SpO2: 100% 100% 96% 100%    Wt Readings from Last 3 Encounters:  01/21/12 77.61 kg (171 lb 1.6 oz)  01/21/12 77.61 kg (171 lb 1.6 oz)  12/11/11 79.379 kg (175 lb)     Intake/Output Summary (Last 24 hours) at 01/22/12 1055 Last data filed at 01/22/12 0549  Gross per 24 hour  Intake 1222.67 ml  Output   1135 ml  Net  87.67 ml    Exam Awake Alert, Oriented X 3, No new F.N deficits, Normal affect [place].AT,PERRAL Supple Neck,No JVD, No cervical lymphadenopathy appriciated.  Symmetrical Chest wall movement, Good air movement bilaterally, CTAB RRR,No Gallops,Rubs or new Murmurs, No Parasternal Heave +ve B.Sounds, Abd Soft, Non tender, No organomegaly appriciated, No rebound - guarding or rigidity. No Cyanosis, Clubbing or edema, No  new Rash or bruise, left leg is externally rotated   Data Review   Micro Results Recent Results (from the past 240 hour(s))  URINE CULTURE  Status: Normal (Preliminary result)   Collection Time   01/21/12 11:51 AM      Component Value Range Status Comment   Specimen Description URINE, CATHETERIZED   Final    Special Requests NONE   Final    Culture  Setup Time 01/21/2012 12:30   Final    Colony Count >=100,000 COLONIES/ML   Final    Culture ESCHERICHIA COLI   Final    Report Status PENDING   Incomplete   SURGICAL PCR SCREEN     Status: Normal   Collection Time   01/21/12 12:25 PM      Component Value Range Status Comment   MRSA, PCR NEGATIVE  NEGATIVE Final    Staphylococcus aureus NEGATIVE  NEGATIVE Final     Radiology Reports Dg Chest 2 View  01/20/2012  *RADIOLOGY REPORT*  Clinical Data: Larey Seat at home  CHEST - 2 VIEW  Comparison: Chest x-ray of 02/24/2010  Findings: No active infiltrate or effusion is seen.  Mediastinal contours are stable.  The heart is mildly enlarged and stable.  The bones are osteopenic and there is diffuse degenerative change throughout the thoracic spine.  IMPRESSION: Stable mild cardiomegaly.  No active lung disease.   Original Report Authenticated By: Juline Patch, M.D.    Dg Hip Complete Left  01/20/2012  *RADIOLOGY REPORT*  Clinical Data: Fall and hip pain.  LEFT HIP - COMPLETE 2+ VIEW  Comparison: Pelvic CT 12/25/2011  Findings: The left hip is located on the cross-table lateral views. There is a left intertrochanteric femur fracture.  The lesser trochanter fragment is displaced.  There is medial joint space narrowing in both hips.  Right hip appears to be grossly intact. Spinal surgery in the lower lumbar spine.  Pelvic bony ring is intact.  IMPRESSION: Left femoral intertrochanteric fracture.  Bilateral hip osteoarthritis.   Original Report Authenticated By: Richarda Overlie, M.D.    Ct Head Wo Contrast  01/20/2012  *RADIOLOGY REPORT*  Clinical Data:   Fall, dizziness.  CT HEAD WITHOUT CONTRAST  Technique:  Contiguous axial images were obtained from the base of the skull through the vertex without contrast.  Comparison: 10/08/2010  Findings: Prominence of the sulci, cisterns, and ventricles, in keeping with volume loss. There are subcortical and periventricular white matter hypodensities, a nonspecific finding most often seen with chronic microangiopathic changes.  There is no evidence for acute hemorrhage, overt hydrocephalus, mass lesion, or abnormal extra-axial fluid collection.  No definite CT evidence for acute cortical based (large artery) infarction. Partially opacified right maxillary sinus with mixed air/debris. Paranasal sinuses and mastoid air cells are otherwise predominately clear.  No displaced calvarial fracture.  IMPRESSION: White matter changes and volume loss as above.  No CT evidence of acute intracranial abnormality.  Right maxillary sinus opacification.  Correlate with clinical symptoms for acute sinusitis.   Original Report Authenticated By: Waneta Martins, M.D.         CBC  Lab 01/22/12 0600 01/21/12 0545 01/20/12 2059  WBC 12.4* 11.6* 15.2*  HGB 7.4* 9.3* 10.8*  HCT 21.8* 27.5* 31.5*  PLT 144* 162 197  MCV 100.0 97.9 97.5  MCH 33.9 33.1 33.4  MCHC 33.9 33.8 34.3  RDW 18.7* 17.9* 18.0*  LYMPHSABS -- -- 2.3  MONOABS -- -- 0.7  EOSABS -- -- 0.1  BASOSABS -- -- 0.1  BANDABS -- -- --    Chemistries   Lab 01/22/12 0600 01/21/12 0545 01/20/12 2059  NA 137 139 135  K 4.2 3.9 3.7  CL 103 104 101  CO2 26 25 23   GLUCOSE 129* 109* 124*  BUN 18 18 21   CREATININE 0.95 0.89 0.89  CALCIUM 7.9* 8.5 9.0  MG 2.1 -- --  AST -- -- --  ALT -- -- --  ALKPHOS -- -- --  BILITOT -- -- --   ------------------------------------------------------------------------------------------------------------------ estimated creatinine clearance is 46.5 ml/min (by C-G formula based on Cr of  0.95). ------------------------------------------------------------------------------------------------------------------ No results found for this basename: HGBA1C:2 in the last 72 hours ------------------------------------------------------------------------------------------------------------------ No results found for this basename: CHOL:2,HDL:2,LDLCALC:2,TRIG:2,CHOLHDL:2,LDLDIRECT:2 in the last 72 hours ------------------------------------------------------------------------------------------------------------------ No results found for this basename: TSH,T4TOTAL,FREET3,T3FREE,THYROIDAB in the last 72 hours ------------------------------------------------------------------------------------------------------------------ No results found for this basename: VITAMINB12:2,FOLATE:2,FERRITIN:2,TIBC:2,IRON:2,RETICCTPCT:2 in the last 72 hours  Coagulation profile  Lab 01/22/12 0600 01/21/12 0545  INR 1.19 1.06  PROTIME -- --    No results found for this basename: DDIMER:2 in the last 72 hours  Cardiac Enzymes No results found for this basename: CK:3,CKMB:3,TROPONINI:3,MYOGLOBIN:3 in the last 168 hours ------------------------------------------------------------------------------------------------------------------ No components found with this basename: POCBNP:3

## 2012-01-22 NOTE — Progress Notes (Signed)
ANTICOAGULATION CONSULT NOTE - Follow-Up Consult  Pharmacy Consult for Coumadin Indication: VTE prophylaxis  No Known Allergies  Patient Measurements: Height: 5\' 7"  (170.2 cm) Weight: 171 lb 1.6 oz (77.61 kg) IBW/kg (Calculated) : 61.6  Vital Signs: Temp: 98 F (36.7 C) (10/24 0559) BP: 101/54 mmHg (10/24 0559) Pulse Rate: 71  (10/24 0559)  Labs:  Basename 01/22/12 0600 01/21/12 0545 01/20/12 2059  HGB 7.4* 9.3* --  HCT 21.8* 27.5* 31.5*  PLT 144* 162 197  APTT -- -- --  LABPROT 14.9 13.7 --  INR 1.19 1.06 --  HEPARINUNFRC -- -- --  CREATININE 0.95 0.89 0.89  CKTOTAL -- -- --  CKMB -- -- --  TROPONINI -- -- --    Estimated Creatinine Clearance: 46.5 ml/min (by C-G formula based on Cr of 0.95).   Medical History: Past Medical History  Diagnosis Date  . DIABETES MELLITUS, BORDERLINE     A1c 6.0 in 2011, Diet controlled  . LOW BACK PAIN, CHRONIC   . Diverticulosis   . ANEMIA   . ANXIETY   . Arthritis   . HYPERTENSION   . HYPERLIPIDEMIA   . HYPOTHYROIDISM   . PAD (peripheral artery disease)     Medications:  Prescriptions prior to admission  Medication Sig Dispense Refill  . amLODipine-benazepril (LOTREL) 5-20 MG per capsule Take 1 capsule by mouth daily.  30 capsule  5  . aspirin EC 81 MG tablet Take 81 mg by mouth daily.      . Carboxymethylcellulose Sodium (LUBRICANT EYE DROPS OP) Apply 2 drops to eye daily as needed. For dry eyes      . Cholecalciferol 2000 UNITS TABS Take 2,000 Units by mouth daily.      Marland Kitchen ezetimibe (ZETIA) 10 MG tablet Take 10 mg by mouth daily.      Marland Kitchen levothyroxine (SYNTHROID, LEVOTHROID) 125 MCG tablet Take 125 mcg by mouth daily.      . methocarbamol (ROBAXIN) 500 MG tablet Take 500 mg by mouth daily as needed. For sleep/spasms      . Multiple Vitamin (MULTIVITAMIN WITH MINERALS) TABS Take 1 tablet by mouth daily.      . naproxen (NAPROSYN) 500 MG tablet Take 500 mg by mouth daily as needed. For pain      . zolpidem (AMBIEN) 10 MG  tablet Take 1 tablet (10 mg total) by mouth at bedtime as needed for sleep.  30 tablet  2   Scheduled:     . sodium chloride   Intravenous STAT  .  ceFAZolin (ANCEF) IV  3 g Intravenous 60 min Pre-Op  .  ceFAZolin (ANCEF) IV  2 g Intravenous Q6H  . chlorhexidine  60 mL Topical Once  . coumadin book   Does not apply Once  . levothyroxine  125 mcg Oral Daily  . magnesium sulfate 1 - 4 g bolus IVPB  1 g Intravenous Once  . metoprolol tartrate  25 mg Oral BID  . warfarin  4 mg Oral Once  . warfarin   Does not apply Once  . Warfarin - Pharmacist Dosing Inpatient   Does not apply q1800  . zolpidem  5 mg Oral QHS    Assessment: 87 YOF s/p intramedullary nail to left hip to start Coumadin for VTE prophylaxis. Day 2 of coumadin; INR today is 1.19.  H/H/platelets low but anemic pre-op. No significant bleeding per OP reports.   Goal of Therapy:  INR 2-3 Platelet monitoring per protocol? Yes   Plan:  1. Coumadin  4 mg po x1 tonight 2. Follow-up daily PT/INR.  Bernadene Person PharmD Candidate 01/22/2012 1:06 PM  I have reviewed the above and agree with the plan.  Harland German, Pharm D 01/22/2012 1:57 PM

## 2012-01-22 NOTE — Evaluation (Signed)
Physical Therapy Evaluation Patient Details Name: Brittney Gates MRN: [id number] DOB: 07-04-1924 Today's Date: 01/22/2012 Time: 1331-1400 PT Time Calculation (min): 29 min  PT Assessment / Plan / Recommendation Clinical Impression  Pt is an 78 y/o female s/p ORIF of L hip fracture from fall at home.  Pt lives with her spouse who is unable to provide physical assistance.  Pt has sons who live nearby and can check on pt. Pt doing well with mobility mod assist (pt 70%).  Pt will benefit from continued PT in post acute in-patient setting.  Acute PT will continue to progress pt mobility in preparation for discharge to SNF for Rehab.       PT Assessment  Patient needs continued PT services    Follow Up Recommendations  Supervision/Assistance - 24 hour;Post acute inpatient    Does the patient have the potential to tolerate intense rehabilitation   No, Recommend SNF  Barriers to Discharge None Pt spouse unable to provide care to pt.     Equipment Recommendations  Rolling walker with 5" wheels;3 in 1 bedside comode;Tub/shower bench    Recommendations for Other Services OT consult   Frequency Min 6X/week    Precautions / Restrictions Precautions Precautions: Fall Precaution Comments: injury was result of a fall.   Restrictions Weight Bearing Restrictions: Yes LLE Weight Bearing: Weight bearing as tolerated   Pertinent Vitals/Pain Pt c/o pain in L hip 6/10 with activity.  Repositioned hip and notified RN. Pt became nauseated at end of session vomiting all over herself.  Removed soiled clothing from pt and notified CNA who bathed and redressed pt.        Mobility  Bed Mobility Bed Mobility: Supine to Sit;Sitting - Scoot to Edge of Bed Supine to Sit: 3: Mod assist;HOB elevated;With rails Sitting - Scoot to Edge of Bed: 3: Mod assist;With rail Details for Bed Mobility Assistance: Step by step cueing for technique.  Assist to manage L LE.   Assist to raise shoulders from bed secondary to  pain with hip flexion.  Transfers Transfers: Sit to Stand;Stand to Sit;Stand Pivot Transfers Sit to Stand: 3: Mod assist;With upper extremity assist;From bed;From elevated surface Sit to Stand: Patient Percentage: 70% Stand to Sit: 3: Mod assist;To chair/3-in-1;With upper extremity assist Stand to Sit: Patient Percentage: 60% Stand Pivot Transfers: 3: Mod assist Stand Pivot Transfers: Patient Percentage: 70% Details for Transfer Assistance: Step by step multimodal cueing for technique. Assist to position L LE.  Assist to initiate standing and control descent to sit.  Assist to manage RW and steady pt for stand pivot transfer.   Ambulation/Gait Ambulation/Gait Assistance: Not tested (comment) (Pt dizzy and nauseated in standing. ) Stairs: No Wheelchair Mobility Wheelchair Mobility: No    Shoulder Instructions     Exercises     PT Diagnosis: Difficulty walking;Generalized weakness;Acute pain  PT Problem List: Decreased strength;Decreased range of motion;Decreased activity tolerance;Decreased mobility;Decreased knowledge of precautions;Pain;Decreased knowledge of use of DME PT Treatment Interventions: DME instruction;Gait training;Functional mobility training;Therapeutic activities;Therapeutic exercise;Patient/family education   PT Goals Acute Rehab PT Goals PT Goal Formulation: With patient Time For Goal Achievement: 02/05/12 Potential to Achieve Goals: Good Pt will go Supine/Side to Sit: with supervision PT Goal: Supine/Side to Sit - Progress: Goal set today Pt will go Sit to Supine/Side: with supervision PT Goal: Sit to Supine/Side - Progress: Goal set today Pt will go Sit to Stand: with supervision PT Goal: Sit to Stand - Progress: Goal set today Pt will go Stand to Sit:  with supervision PT Goal: Stand to Sit - Progress: Goal set today Pt will Transfer Bed to Chair/Chair to Bed: with supervision PT Transfer Goal: Bed to Chair/Chair to Bed - Progress: Goal set today Pt will  Ambulate: 16 - 50 feet;with supervision;with rolling walker PT Goal: Ambulate - Progress: Goal set today  Visit Information  Last PT Received On: 01/22/12    Subjective Data  Subjective: I hope this doesnt hurt to bad   Prior Functioning  Home Living Lives With: Spouse Available Help at Discharge: Skilled Nursing Facility Prior Function Level of Independence: Independent    Cognition  Overall Cognitive Status: Appears within functional limits for tasks assessed/performed Arousal/Alertness: Awake/alert Orientation Level: Appears intact for tasks assessed Behavior During Session: [facility] for tasks performed    Extremity/Trunk Assessment Right Upper Extremity Assessment RUE ROM/Strength/Tone: Within functional levels RUE Sensation: WFL - Light Touch;WFL - Proprioception Left Upper Extremity Assessment LUE ROM/Strength/Tone: Within functional levels LUE Sensation: WFL - Light Touch;WFL - Proprioception Right Lower Extremity Assessment RLE ROM/Strength/Tone: WFL for tasks assessed RLE Sensation: WFL - Light Touch;WFL - Proprioception Left Lower Extremity Assessment LLE ROM/Strength/Tone: Unable to fully assess;Due to precautions;Due to pain LLE Sensation: WFL - Light Touch;WFL - Proprioception Trunk Assessment Trunk Assessment: Normal   Balance Balance Balance Assessed: Yes Static Sitting Balance Static Sitting - Balance Support: Feet supported;Bilateral upper extremity supported Static Sitting - Level of Assistance: 5: Stand by assistance Static Sitting - Comment/# of Minutes: Pt sat on EOB x 2 min with no LOB.   C/o mild dizzinss initiallly which improved after about a minute.  Static Standing Balance Static Standing - Balance Support: Bilateral upper extremity supported Static Standing - Level of Assistance: 5: Stand by assistance Static Standing - Comment/# of Minutes: <1 minute standing at bedside with RW. Pt presents with mild posterior lean initially which improved after a  few seconds and tactile/verbal cues to shift wt forward.  Pt c/o worsening dizziness in standing.   End of Session PT - End of Session Equipment Utilized During Treatment: Gait belt Activity Tolerance: Patient limited by fatigue;Treatment limited secondary to medical complications (Comment);Other (comment) (Pt present with dizziness nausea and vomitting. ) Patient left: with call bell/phone within reach;in chair;with family/visitor present;Other (comment) (Nurse tech to wash pt after vomitting .) Nurse Communication: Mobility status;Weight bearing status;Precautions  GP     Yoselin Amerman 01/22/2012, 3:07 PM  Terah Robey L. Christianne Zacher DPT [phone]

## 2012-01-22 NOTE — Evaluation (Deleted)
Physical Therapy Evaluation Patient Details Name: Brittney Gates MRN: [id number] DOB: 01/25/1925 Today's Date: 01/22/2012 Time: 1331-1400 PT Time Calculation (min): 29 min  PT Assessment / Plan / Recommendation Clinical Impression  Pt is an 78 y/o female s/p ORIF of L hip fracture from fall at home.  Pt lives with her spouse who is unable to provide physical assistance.  Pt has sons who live nearby and can check on pt. Pt doing well with mobility mod assist (pt 70%).  Pt will benefit from continued PT in in-patient setting.  Acute PT will continue to progress pt mobility in preparation for discharge to in-patient rehab.       PT Assessment  Patient needs continued PT services    Follow Up Recommendations  Post acute inpatient;Supervision/Assistance - 24 hour    Does the patient have the potential to tolerate intense rehabilitation   Yes, Recommend IP Rehab Screening  Barriers to Discharge None Pt spouse unable to provide care to pt.     Equipment Recommendations  Rolling walker with 5" wheels;3 in 1 bedside comode;Tub/shower bench    Recommendations for Other Services Rehab consult;OT consult   Frequency Min 6X/week    Precautions / Restrictions Precautions Precautions: Fall Precaution Comments: injury was result of a fall.   Restrictions Weight Bearing Restrictions: Yes LLE Weight Bearing: Weight bearing as tolerated   Pertinent Vitals/Pain Pt c/o L hip pain 4-8/10 (worse with activity)      Mobility  Bed Mobility Bed Mobility: Supine to Sit;Sitting - Scoot to Edge of Bed Supine to Sit: 3: Mod assist;HOB elevated;With rails Sitting - Scoot to Edge of Bed: 3: Mod assist;With rail Details for Bed Mobility Assistance: Step by step cueing for technique.  Assist to manage L LE.   Assist to raise shoulders from bed secondary to pain with hip flexion.  Transfers Transfers: Sit to Stand;Stand to Sit;Stand Pivot Transfers Sit to Stand: 3: Mod assist;With upper extremity  assist;From bed;From elevated surface Sit to Stand: Patient Percentage: 70% Stand to Sit: 3: Mod assist;To chair/3-in-1;With upper extremity assist Stand to Sit: Patient Percentage: 60% Stand Pivot Transfers: 3: Mod assist Stand Pivot Transfers: Patient Percentage: 70% Details for Transfer Assistance: Step by step multimodal cueing for technique. Assist to position L LE.  Assist to initiate standing and control descent to sit.  Assist to manage RW and steady pt for stand pivot transfer.   Ambulation/Gait Ambulation/Gait Assistance: Not tested (comment) (Pt dizzy and nauseated in standing. ) Stairs: No Wheelchair Mobility Wheelchair Mobility: No    Shoulder Instructions     Exercises     PT Diagnosis: Difficulty walking;Generalized weakness;Acute pain  PT Problem List: Decreased strength;Decreased range of motion;Decreased activity tolerance;Decreased mobility;Decreased knowledge of precautions;Pain;Decreased knowledge of use of DME PT Treatment Interventions: DME instruction;Gait training;Functional mobility training;Therapeutic activities;Therapeutic exercise;Patient/family education   PT Goals Acute Rehab PT Goals PT Goal Formulation: With patient Time For Goal Achievement: 02/05/12 Potential to Achieve Goals: Good Pt will go Supine/Side to Sit: with supervision PT Goal: Supine/Side to Sit - Progress: Goal set today Pt will go Sit to Supine/Side: with supervision PT Goal: Sit to Supine/Side - Progress: Goal set today Pt will go Sit to Stand: with supervision PT Goal: Sit to Stand - Progress: Goal set today Pt will go Stand to Sit: with supervision PT Goal: Stand to Sit - Progress: Goal set today Pt will Transfer Bed to Chair/Chair to Bed: with supervision PT Transfer Goal: Bed to Chair/Chair to Bed - Progress: Goal  set today Pt will Ambulate: 16 - 50 feet;with supervision;with rolling walker PT Goal: Ambulate - Progress: Goal set today  Visit Information  Last PT Received  On: 01/22/12    Subjective Data  Subjective: I hope this doesnt hurt to bad   Prior Functioning  Home Living Lives With: Spouse Available Help at Discharge: Skilled Nursing Facility Prior Function Level of Independence: Independent    Cognition  Overall Cognitive Status: Appears within functional limits for tasks assessed/performed Arousal/Alertness: Awake/alert Orientation Level: Appears intact for tasks assessed Behavior During Session: [facility] for tasks performed    Extremity/Trunk Assessment Right Upper Extremity Assessment RUE ROM/Strength/Tone: Within functional levels RUE Sensation: WFL - Light Touch;WFL - Proprioception Left Upper Extremity Assessment LUE ROM/Strength/Tone: Within functional levels LUE Sensation: WFL - Light Touch;WFL - Proprioception Right Lower Extremity Assessment RLE ROM/Strength/Tone: WFL for tasks assessed RLE Sensation: WFL - Light Touch;WFL - Proprioception Left Lower Extremity Assessment LLE ROM/Strength/Tone: Unable to fully assess;Due to precautions;Due to pain LLE Sensation: WFL - Light Touch;WFL - Proprioception Trunk Assessment Trunk Assessment: Normal   Balance Balance Balance Assessed: Yes Static Sitting Balance Static Sitting - Balance Support: Feet supported;Bilateral upper extremity supported Static Sitting - Level of Assistance: 5: Stand by assistance Static Sitting - Comment/# of Minutes: Pt sat on EOB x 2 min with no LOB.   C/o mild dizzinss initiallly which improved after about a minute.  Static Standing Balance Static Standing - Balance Support: Bilateral upper extremity supported Static Standing - Level of Assistance: 5: Stand by assistance Static Standing - Comment/# of Minutes: <1 minute standing at bedside with RW. Pt presents with mild posterior lean initially which improved after a few seconds and tactile/verbal cues to shift wt forward.  Pt c/o worsening dizziness in standing.   End of Session PT - End of Session Equipment  Utilized During Treatment: Gait belt Activity Tolerance: Patient limited by fatigue;Treatment limited secondary to medical complications (Comment);Other (comment) (Pt present with dizziness nausea and vomitting. ) Patient left: with call bell/phone within reach;in chair;with family/visitor present;Other (comment) (Nurse tech to wash pt after vomitting .) Nurse Communication: Mobility status;Weight bearing status;Precautions  GP     Jerrick Farve 01/22/2012, 2:58 PM  Cambell Rickenbach L. Alianna Wurster DPT [phone]

## 2012-01-23 ENCOUNTER — Encounter (HOSPITAL_COMMUNITY): Payer: Self-pay | Admitting: Orthopedic Surgery

## 2012-01-23 DIAGNOSIS — Z5189 Encounter for other specified aftercare: Secondary | ICD-10-CM | POA: Diagnosis not present

## 2012-01-23 DIAGNOSIS — M545 Low back pain, unspecified: Secondary | ICD-10-CM | POA: Diagnosis not present

## 2012-01-23 DIAGNOSIS — M1710 Unilateral primary osteoarthritis, unspecified knee: Secondary | ICD-10-CM | POA: Diagnosis not present

## 2012-01-23 DIAGNOSIS — R269 Unspecified abnormalities of gait and mobility: Secondary | ICD-10-CM | POA: Diagnosis not present

## 2012-01-23 DIAGNOSIS — M62838 Other muscle spasm: Secondary | ICD-10-CM | POA: Diagnosis not present

## 2012-01-23 DIAGNOSIS — E7800 Pure hypercholesterolemia, unspecified: Secondary | ICD-10-CM | POA: Diagnosis not present

## 2012-01-23 DIAGNOSIS — S72009D Fracture of unspecified part of neck of unspecified femur, subsequent encounter for closed fracture with routine healing: Secondary | ICD-10-CM | POA: Diagnosis not present

## 2012-01-23 DIAGNOSIS — E119 Type 2 diabetes mellitus without complications: Secondary | ICD-10-CM | POA: Diagnosis not present

## 2012-01-23 DIAGNOSIS — Z7901 Long term (current) use of anticoagulants: Secondary | ICD-10-CM | POA: Diagnosis not present

## 2012-01-23 DIAGNOSIS — D649 Anemia, unspecified: Secondary | ICD-10-CM | POA: Diagnosis not present

## 2012-01-23 DIAGNOSIS — S72009A Fracture of unspecified part of neck of unspecified femur, initial encounter for closed fracture: Secondary | ICD-10-CM | POA: Diagnosis not present

## 2012-01-23 DIAGNOSIS — F411 Generalized anxiety disorder: Secondary | ICD-10-CM | POA: Diagnosis not present

## 2012-01-23 DIAGNOSIS — Z9181 History of falling: Secondary | ICD-10-CM | POA: Diagnosis not present

## 2012-01-23 DIAGNOSIS — G4700 Insomnia, unspecified: Secondary | ICD-10-CM | POA: Diagnosis not present

## 2012-01-23 DIAGNOSIS — D62 Acute posthemorrhagic anemia: Secondary | ICD-10-CM | POA: Diagnosis not present

## 2012-01-23 DIAGNOSIS — E039 Hypothyroidism, unspecified: Secondary | ICD-10-CM | POA: Diagnosis not present

## 2012-01-23 DIAGNOSIS — S7290XA Unspecified fracture of unspecified femur, initial encounter for closed fracture: Secondary | ICD-10-CM | POA: Diagnosis not present

## 2012-01-23 DIAGNOSIS — E785 Hyperlipidemia, unspecified: Secondary | ICD-10-CM | POA: Diagnosis not present

## 2012-01-23 DIAGNOSIS — M6281 Muscle weakness (generalized): Secondary | ICD-10-CM | POA: Diagnosis not present

## 2012-01-23 DIAGNOSIS — M25559 Pain in unspecified hip: Secondary | ICD-10-CM | POA: Diagnosis not present

## 2012-01-23 DIAGNOSIS — K5900 Constipation, unspecified: Secondary | ICD-10-CM | POA: Diagnosis not present

## 2012-01-23 DIAGNOSIS — I10 Essential (primary) hypertension: Secondary | ICD-10-CM | POA: Diagnosis not present

## 2012-01-23 DIAGNOSIS — R748 Abnormal levels of other serum enzymes: Secondary | ICD-10-CM | POA: Diagnosis not present

## 2012-01-23 DIAGNOSIS — S72143A Displaced intertrochanteric fracture of unspecified femur, initial encounter for closed fracture: Secondary | ICD-10-CM | POA: Diagnosis not present

## 2012-01-23 LAB — CBC
HCT: 23 % — ABNORMAL LOW (ref 36.0–46.0)
Hemoglobin: 7.7 g/dL — ABNORMAL LOW (ref 12.0–15.0)
MCH: 32.1 pg (ref 26.0–34.0)
MCHC: 33.5 g/dL (ref 30.0–36.0)
MCV: 95.8 fL (ref 78.0–100.0)
Platelets: 131 10*3/uL — ABNORMAL LOW (ref 150–400)
RBC: 2.4 MIL/uL — ABNORMAL LOW (ref 3.87–5.11)
RDW: 20.2 % — ABNORMAL HIGH (ref 11.5–15.5)
WBC: 11.6 10*3/uL — ABNORMAL HIGH (ref 4.0–10.5)

## 2012-01-23 LAB — URINALYSIS, MICROSCOPIC ONLY
Bilirubin Urine: NEGATIVE
Glucose, UA: NEGATIVE mg/dL
Ketones, ur: NEGATIVE mg/dL
Leukocytes, UA: NEGATIVE
Nitrite: NEGATIVE
Protein, ur: NEGATIVE mg/dL
Specific Gravity, Urine: 1.023 (ref 1.005–1.030)
Urobilinogen, UA: 0.2 mg/dL (ref 0.0–1.0)
pH: 5.5 (ref 5.0–8.0)

## 2012-01-23 LAB — BASIC METABOLIC PANEL
BUN: 21 mg/dL (ref 6–23)
CO2: 27 mEq/L (ref 19–32)
Calcium: 8.3 mg/dL — ABNORMAL LOW (ref 8.4–10.5)
Chloride: 101 mEq/L (ref 96–112)
Creatinine, Ser: 1.09 mg/dL (ref 0.50–1.10)
GFR calc Af Amer: 52 mL/min — ABNORMAL LOW (ref >90)
GFR calc non Af Amer: 45 mL/min — ABNORMAL LOW (ref >90)
Glucose, Bld: 117 mg/dL — ABNORMAL HIGH (ref 70–99)
Potassium: 4.1 mEq/L (ref 3.5–5.1)
Sodium: 134 mEq/L — ABNORMAL LOW (ref 135–145)

## 2012-01-23 LAB — URINE CULTURE: Colony Count: >=100000

## 2012-01-23 LAB — GLUCOSE, CAPILLARY
Glucose-Capillary: 109 mg/dL — ABNORMAL HIGH (ref 70–99)
Glucose-Capillary: 115 mg/dL — ABNORMAL HIGH (ref 70–99)
Glucose-Capillary: 123 mg/dL — ABNORMAL HIGH (ref 70–99)

## 2012-01-23 LAB — PROTIME-INR
INR: 1.55 — ABNORMAL HIGH (ref 0.00–1.49)
Prothrombin Time: 18.1 seconds — ABNORMAL HIGH (ref 11.6–15.2)

## 2012-01-23 LAB — TRANSFUSION REACTION
DAT C3: NEGATIVE
Post RXN DAT IgG: NEGATIVE

## 2012-01-23 MED ORDER — METHOCARBAMOL 500 MG PO TABS: 500.0000 mg | ORAL_TABLET | Freq: Every day | ORAL | Status: DC | PRN

## 2012-01-23 MED ORDER — HYDROCODONE-ACETAMINOPHEN 5-500 MG PO TABS: 1.0000 | ORAL_TABLET | Freq: Four times a day (QID) | ORAL | Status: DC | PRN

## 2012-01-23 MED ORDER — TRAMADOL HCL 50 MG PO TABS: 50.0000 mg | ORAL_TABLET | Freq: Four times a day (QID) | ORAL | Status: DC | PRN

## 2012-01-23 MED ORDER — ENOXAPARIN SODIUM 40 MG/0.4ML ~~LOC~~ SOLN: 40.0000 mg | Freq: Two times a day (BID) | SUBCUTANEOUS | Status: DC

## 2012-01-23 MED ORDER — TRAMADOL HCL 50 MG PO TABS
50.0000 mg | ORAL_TABLET | Freq: Once | ORAL | Status: DC
  Administered 2012-01-23: 50 mg via ORAL

## 2012-01-23 MED ORDER — HYDROCODONE-ACETAMINOPHEN 5-325 MG PO TABS: 1.0000 | ORAL_TABLET | ORAL | Status: DC | PRN

## 2012-01-23 MED ORDER — WARFARIN SODIUM 1 MG PO TABS: 1.0000 mg | ORAL_TABLET | Freq: Every day | ORAL | Status: DC

## 2012-01-23 NOTE — Progress Notes (Signed)
Requested Dr Lajoyce Corners to address discharge expectations as she was told she was being discharged on Monday

## 2012-01-23 NOTE — Discharge Summary (Signed)
Physician Discharge Summary  KEIAIRA SHALOM MRN: [id number] DOB/AGE: Nov 14, 1924 78 y.o.  PCP: Rene Paci, MD   Admit date: 01/20/2012 Discharge date: 01/23/2012  Discharge Diagnoses:  Status post intramedullary nailing  *Hip fracture left, intertrochanteric Active Problems:  HYPOTHYROIDISM  HYPERLIPIDEMIA  HYPERTENSION  Fall at home  Pre-operative cardiovascular examination Escherichia coli UTI    Medication List     As of 01/23/2012  8:36 AM    STOP taking these medications         naproxen 500 MG tablet   Commonly known as: NAPROSYN      TAKE these medications         amLODipine-benazepril 5-20 MG per capsule   Commonly known as: LOTREL   Take 1 capsule by mouth daily.      aspirin EC 81 MG tablet   Take 81 mg by mouth daily.      Cholecalciferol 2000 UNITS Tabs   Take 2,000 Units by mouth daily.      enoxaparin 40 MG/0.4ML injection   Commonly known as: LOVENOX   Inject 0.4 mLs (40 mg total) into the skin every 12 (twelve) hours.      ezetimibe 10 MG tablet   Commonly known as: ZETIA   Take 10 mg by mouth daily.      HYDROcodone-acetaminophen 5-500 MG per tablet   Commonly known as: VICODIN   Take 1 tablet by mouth every 6 (six) hours as needed for pain.      HYDROcodone-acetaminophen 5-325 MG per tablet   Commonly known as: NORCO/VICODIN   Take 1-2 tablets by mouth every 4 (four) hours as needed.      levothyroxine 125 MCG tablet   Commonly known as: SYNTHROID, LEVOTHROID   Take 125 mcg by mouth daily.      LUBRICANT EYE DROPS OP   Apply 2 drops to eye daily as needed. For dry eyes      methocarbamol 500 MG tablet   Commonly known as: ROBAXIN   Take 1 tablet (500 mg total) by mouth daily as needed. For sleep/spasms      multivitamin with minerals Tabs   Take 1 tablet by mouth daily.      traMADol 50 MG tablet   Commonly known as: ULTRAM   Take 1 tablet (50 mg total) by mouth every 6 (six) hours as needed for pain. Maximum dose=  8 tablets per day      warfarin 1 MG tablet   Commonly known as: COUMADIN   Take 1 tablet (1 mg total) by mouth daily.      zolpidem 10 MG tablet   Commonly known as: AMBIEN   Take 1 tablet (10 mg total) by mouth at bedtime as needed for sleep.        Discharge Condition: Stable  Disposition: Nursing facility    Consults: * #1 orthopedics  Significant Diagnostic Studies: Dg Chest 2 View  01/20/2012  *RADIOLOGY REPORT*  Clinical Data: Larey Seat at home  CHEST - 2 VIEW  Comparison: Chest x-ray of 02/24/2010  Findings: No active infiltrate or effusion is seen.  Mediastinal contours are stable.  The heart is mildly enlarged and stable.  The bones are osteopenic and there is diffuse degenerative change throughout the thoracic spine.  IMPRESSION: Stable mild cardiomegaly.  No active lung disease.   Original Report Authenticated By: Juline Patch, M.D.    Dg Hip Complete Left  01/20/2012  *RADIOLOGY REPORT*  Clinical Data: Fall and hip pain.  LEFT HIP - COMPLETE 2+ VIEW  Comparison: Pelvic CT 12/25/2011  Findings: The left hip is located on the cross-table lateral views. There is a left intertrochanteric femur fracture.  The lesser trochanter fragment is displaced.  There is medial joint space narrowing in both hips.  Right hip appears to be grossly intact. Spinal surgery in the lower lumbar spine.  Pelvic bony ring is intact.  IMPRESSION: Left femoral intertrochanteric fracture.  Bilateral hip osteoarthritis.   Original Report Authenticated By: Richarda Overlie, M.D.    Dg Hip Operative Left  01/21/2012  *RADIOLOGY REPORT*  Clinical Data: Left hip pinning.  OPERATIVE LEFT HIP  Comparison: 01/20/2012.  Findings: Two intraoperative views of the left hip submitted for review after surgery.  This single projection reveals reduction of left intratrochanteric fracture with a dynamic sliding type screw which appears in satisfactory position on this single projection.  IMPRESSION: Open reduction and internal  fixation of left intratrochanteric fracture.   Original Report Authenticated By: Fuller Canada, M.D.    Ct Head Wo Contrast  01/20/2012  *RADIOLOGY REPORT*  Clinical Data:  Fall, dizziness.  CT HEAD WITHOUT CONTRAST  Technique:  Contiguous axial images were obtained from the base of the skull through the vertex without contrast.  Comparison: 10/08/2010  Findings: Prominence of the sulci, cisterns, and ventricles, in keeping with volume loss. There are subcortical and periventricular white matter hypodensities, a nonspecific finding most often seen with chronic microangiopathic changes.  There is no evidence for acute hemorrhage, overt hydrocephalus, mass lesion, or abnormal extra-axial fluid collection.  No definite CT evidence for acute cortical based (large artery) infarction. Partially opacified right maxillary sinus with mixed air/debris. Paranasal sinuses and mastoid air cells are otherwise predominately clear.  No displaced calvarial fracture.  IMPRESSION: White matter changes and volume loss as above.  No CT evidence of acute intracranial abnormality.  Right maxillary sinus opacification.  Correlate with clinical symptoms for acute sinusitis.   Original Report Authenticated By: Waneta Martins, M.D.    Ct Angio Ao+bifem W/cm &/or Wo/cm  12/25/2011  *RADIOLOGY REPORT*  Clinical Data:  Bilateral leg numbness  CT ANGIOGRAPHY OF ABDOMINAL AORTA WITH ILIOFEMORAL RUNOFF  Technique:  Multidetector CT imaging of the abdomen, pelvis and lower extremities was performed using the standard protocol during bolus administration of intravenous contrast.  Multiplanar CT image reconstructions including MIPs were obtained to evaluate the vascular anatomy.  Contrast: OMNIPAQUE IOHEXOL 350 MG/ML SOLN  Comparison:   None.  Findings:  The aorta is nonaneurysmal and patent.  Minimal atherosclerotic vascular calcifications effect the aorta.  Celiac and SMA are patent.  IMA is diminutive and patent.  There is mild  narrowing at the origin of the left renal artery. Right renal artery is patent.  The iliac arteries are moderately tortuous.  Minimal atherosclerotic faster calcifications without significant stenosis of the common internal or external iliac arteries.  Right lower extremity runoff demonstrates patency of the right common femoral and profunda femoral arteries.  Moderate atherosclerotic changes of the distal right superficial femoral artery.  It is otherwise patent.  There is some irregularity of the popliteal artery above the knee but no significant focal stenosis. Mild nonsignificant narrowing of the popliteal artery below the knee.  Contrast opacification of the tibial vessels is limited likely due to phase of contrast.  The proximal anterior tibial arteries patent and then becomes occluded approximately 7 cm beyond its takeoff. The tibioperoneal trunk is patent.  Peroneal and posterior tibial arteries are patent  to the distal leg for two-vessel runoff.  The left common femoral and profunda femoral arteries are patent. There is a 30% narrowing at the origin of the left superficial femoral artery.  Mild diffuse atherosclerotic changes.  Moderate changes effect the distal superficial femoral artery and there is dense focal plaque at table position 676.5 worrisome for a focal significant stenosis.  Mild diffuse disease of the popliteal artery.  Left anterior tibial artery, posterior tibial artery, peroneal artery, and tibioperoneal trunk are patent to the ankle for three-vessel runoff.  Varicose veins are seen along the medial left thigh and medial left leg which could be contributing to the patient's symptoms.  Volume loss at the lung bases.  Liver is unremarkable.  Gallbladder is decompressed.  Spleen, pancreas, adrenal glands are within normal limits.  Duodenal diverticulum at the head of the pancreas is present.  Mild chronic changes of the kidneys.  No evidence of bowel obstruction.  Degenerative changes of the  hip joints, SI joints, and lumbar spine.  Bilateral pedicle screws at L4 and L5-4 posterior fusion and decompression.  The patient is status post L1 vertebroplasty. No other compression deformity is evident.   Review of the MIP images confirms the above findings.  IMPRESSION: No evidence of significant aortic or iliac arterial stenosis.  In the right lower extremity, moderate atherosclerotic changes of the distal superficial femoral artery without significant focal stenosis and two-vessel runoff to the right ankle via the peroneal and posterior tibial arteries.  There is some narrowing at the origin of the left superficial femoral artery.  There is also felt to be a significant stenosis of the distal superficial femoral artery and the abductor canal.  It is densely calcified which may exaggerate the narrowing.  Three- vessel runoff to the left ankle.  Varicose veins along the medial left thigh and leg are present. The patient may have venous insufficiency of the left lower extremity.  Clinical and sonographic evaluation may be helpful.   Original Report Authenticated By: Donavan Burnet, M.D.       Microbiology: Recent Results (from the past 240 hour(s))  URINE CULTURE     Status: Normal (Preliminary result)   Collection Time   01/21/12 11:51 AM      Component Value Range Status Comment   Specimen Description URINE, CATHETERIZED   Final    Special Requests NONE   Final    Culture  Setup Time 01/21/2012 12:30   Final    Colony Count >=100,000 COLONIES/ML   Final    Culture ESCHERICHIA COLI   Final    Report Status PENDING   Incomplete   SURGICAL PCR SCREEN     Status: Normal   Collection Time   01/21/12 12:25 PM      Component Value Range Status Comment   MRSA, PCR NEGATIVE  NEGATIVE Final    Staphylococcus aureus NEGATIVE  NEGATIVE Final      Labs: Results for orders placed during the hospital encounter of 01/20/12 (from the past 48 hour(s))  GLUCOSE, CAPILLARY     Status: Abnormal    Collection Time   01/21/12 11:30 AM      Component Value Range Comment   Glucose-Capillary 100 (*) 70 - 99 mg/dL    Comment 1 Notify RN      Comment 2 Documented in Chart     URINALYSIS, ROUTINE W REFLEX MICROSCOPIC     Status: Abnormal   Collection Time   01/21/12 11:51 AM  Component Value Range Comment   Color, Urine YELLOW  YELLOW    APPearance CLEAR  CLEAR    Specific Gravity, Urine 1.012  1.005 - 1.030    pH 6.5  5.0 - 8.0    Glucose, UA NEGATIVE  NEGATIVE mg/dL    Hgb urine dipstick SMALL (*) NEGATIVE    Bilirubin Urine NEGATIVE  NEGATIVE    Ketones, ur NEGATIVE  NEGATIVE mg/dL    Protein, ur NEGATIVE  NEGATIVE mg/dL    Urobilinogen, UA 0.2  0.0 - 1.0 mg/dL    Nitrite NEGATIVE  NEGATIVE    Leukocytes, UA MODERATE (*) NEGATIVE   URINE CULTURE     Status: Normal (Preliminary result)   Collection Time   01/21/12 11:51 AM      Component Value Range Comment   Specimen Description URINE, CATHETERIZED      Special Requests NONE      Culture  Setup Time 01/21/2012 12:30      Colony Count >=100,000 COLONIES/ML      Culture ESCHERICHIA COLI      Report Status PENDING     URINE MICROSCOPIC-ADD ON     Status: Abnormal   Collection Time   01/21/12 11:51 AM      Component Value Range Comment   Squamous Epithelial / LPF RARE  RARE    WBC, UA 11-20  <3 WBC/hpf    RBC / HPF 3-6  <3 RBC/hpf    Bacteria, UA MANY (*) RARE   SURGICAL PCR SCREEN     Status: Normal   Collection Time   01/21/12 12:25 PM      Component Value Range Comment   MRSA, PCR NEGATIVE  NEGATIVE    Staphylococcus aureus NEGATIVE  NEGATIVE   GLUCOSE, CAPILLARY     Status: Normal   Collection Time   01/21/12  4:19 PM      Component Value Range Comment   Glucose-Capillary 98  70 - 99 mg/dL    Comment 1 Documented in Chart      Comment 2 Notify RN     GLUCOSE, CAPILLARY     Status: Abnormal   Collection Time   01/21/12  9:03 PM      Component Value Range Comment   Glucose-Capillary 136 (*) 70 - 99 mg/dL     GLUCOSE, CAPILLARY     Status: Abnormal   Collection Time   01/21/12 11:57 PM      Component Value Range Comment   Glucose-Capillary 144 (*) 70 - 99 mg/dL   CBC     Status: Abnormal   Collection Time   01/22/12  6:00 AM      Component Value Range Comment   WBC 12.4 (*) 4.0 - 10.5 K/uL    RBC 2.18 (*) 3.87 - 5.11 MIL/uL    Hemoglobin 7.4 (*) 12.0 - 15.0 g/dL    HCT 16.1 (*) 09.6 - 46.0 %    MCV 100.0  78.0 - 100.0 fL    MCH 33.9  26.0 - 34.0 pg    MCHC 33.9  30.0 - 36.0 g/dL    RDW 04.5 (*) 40.9 - 15.5 %    Platelets 144 (*) 150 - 400 K/uL   BASIC METABOLIC PANEL     Status: Abnormal   Collection Time   01/22/12  6:00 AM      Component Value Range Comment   Sodium 137  135 - 145 mEq/L    Potassium 4.2  3.5 - 5.1 mEq/L  Chloride 103  96 - 112 mEq/L    CO2 26  19 - 32 mEq/L    Glucose, Bld 129 (*) 70 - 99 mg/dL    BUN 18  6 - 23 mg/dL    Creatinine, Ser 1.91  0.50 - 1.10 mg/dL    Calcium 7.9 (*) 8.4 - 10.5 mg/dL    GFR calc non Af Amer 53 (*) >90 mL/min    GFR calc Af Amer 62 (*) >90 mL/min   MAGNESIUM     Status: Normal   Collection Time   01/22/12  6:00 AM      Component Value Range Comment   Magnesium 2.1  1.5 - 2.5 mg/dL   PROTIME-INR     Status: Normal   Collection Time   01/22/12  6:00 AM      Component Value Range Comment   Prothrombin Time 14.9  11.6 - 15.2 seconds    INR 1.19  0.00 - 1.49   GLUCOSE, CAPILLARY     Status: Abnormal   Collection Time   01/22/12  7:12 AM      Component Value Range Comment   Glucose-Capillary 116 (*) 70 - 99 mg/dL   VITAMIN Y78     Status: Normal   Collection Time   01/22/12 11:37 AM      Component Value Range Comment   Vitamin B-12 497  211 - 911 pg/mL   FOLATE     Status: Normal   Collection Time   01/22/12 11:37 AM      Component Value Range Comment   Folate >20.0     IRON AND TIBC     Status: Abnormal   Collection Time   01/22/12 11:37 AM      Component Value Range Comment   Iron 15 (*) 42 - 135 ug/dL    TIBC  295 (*) 621 - 470 ug/dL    Saturation Ratios 8 (*) 20 - 55 %    UIBC 184  125 - 400 ug/dL   FERRITIN     Status: Normal   Collection Time   01/22/12 11:37 AM      Component Value Range Comment   Ferritin 173  10 - 291 ng/mL   RETICULOCYTES     Status: Abnormal   Collection Time   01/22/12 11:37 AM      Component Value Range Comment   Retic Ct Pct 2.0  0.4 - 3.1 %    RBC. 2.08 (*) 3.87 - 5.11 MIL/uL    Retic Count, Manual 41.6  19.0 - 186.0 K/uL   TYPE AND SCREEN     Status: Normal (Preliminary result)   Collection Time   01/22/12 11:42 AM      Component Value Range Comment   ABO/RH(D) O POS      Antibody Screen NEG      Sample Expiration 01/25/2012      Unit Number [id number]      Blood Component Type RBC, LR IRR      Unit division 00      Status of Unit ISSUED,FINAL      Transfusion Status OK TO TRANSFUSE      Crossmatch Result Compatible      Unit Number [id number]      Blood Component Type RED CELLS,LR      Unit division 00      Status of Unit ALLOCATED      Transfusion Status OK TO TRANSFUSE      Crossmatch Result Compatible  Unit Number [id number]      Blood Component Type RBC, LR IRR      Unit division 00      Status of Unit ISSUED      Transfusion Status OK TO TRANSFUSE      Crossmatch Result Compatible     ABO/RH     Status: Normal   Collection Time   01/22/12 11:42 AM      Component Value Range Comment   ABO/RH(D) O POS     GLUCOSE, CAPILLARY     Status: Abnormal   Collection Time   01/22/12 11:44 AM      Component Value Range Comment   Glucose-Capillary 124 (*) 70 - 99 mg/dL    Comment 1 Notify RN     PREPARE RBC (CROSSMATCH)     Status: Normal   Collection Time   01/22/12  3:30 PM      Component Value Range Comment   Order Confirmation ORDER PROCESSED BY BLOOD BANK     GLUCOSE, CAPILLARY     Status: Abnormal   Collection Time   01/22/12  4:16 PM      Component Value Range Comment   Glucose-Capillary 118 (*) 70 - 99 mg/dL    Comment  1 Notify RN     TRANSFUSION REACTION     Status: Normal (Preliminary result)   Collection Time   01/22/12  5:42 PM      Component Value Range Comment   Post RXN DAT IgG NEG      DAT C3 NEG      Path interp tx rxn PENDING     URINALYSIS, MICROSCOPIC ONLY     Status: Abnormal   Collection Time   01/22/12 11:10 PM      Component Value Range Comment   Color, Urine YELLOW  YELLOW    APPearance CLEAR  CLEAR    Specific Gravity, Urine 1.023  1.005 - 1.030    pH 5.5  5.0 - 8.0    Glucose, UA NEGATIVE  NEGATIVE mg/dL    Hgb urine dipstick TRACE (*) NEGATIVE    Bilirubin Urine NEGATIVE  NEGATIVE    Ketones, ur NEGATIVE  NEGATIVE mg/dL    Protein, ur NEGATIVE  NEGATIVE mg/dL    Urobilinogen, UA 0.2  0.0 - 1.0 mg/dL    Nitrite NEGATIVE  NEGATIVE    Leukocytes, UA NEGATIVE  NEGATIVE    WBC, UA 3-6  <3 WBC/hpf    RBC / HPF 3-6  <3 RBC/hpf    Bacteria, UA FEW (*) RARE    Squamous Epithelial / LPF RARE  RARE    Casts HYALINE CASTS (*) NEGATIVE   GLUCOSE, CAPILLARY     Status: Abnormal   Collection Time   01/23/12 12:37 AM      Component Value Range Comment   Glucose-Capillary 123 (*) 70 - 99 mg/dL   GLUCOSE, CAPILLARY     Status: Abnormal   Collection Time   01/23/12  5:44 AM      Component Value Range Comment   Glucose-Capillary 109 (*) 70 - 99 mg/dL      HPI :HPI: 78 y.o. female w/ PMHx significant for PAD, HTN, HLD (statin intolerant), DM (borderilne, diet controlled), Anemia, and Hypothyroidism who presented to [facility] on 01/20/2012 after a fall resulting in left hip fracture.  Echo 2011 showed normal LV function, EF 55-60%, no WMAs. Nuclear stress test in January 2012 demonstrated normal myocardial perfusion and EF 57%. Was seen in  clinic by Dr. Copper on 12/11/11 for evaluation of left leg numbness and pain. She also noted occasional chest pressure that is unrelated to exertion and has not changed in frequency or severity and was felt to be noncardiac. She was scheduled  for CT angiogram of her aortoiliac region with runoff which was completed on 9/26 and demonstrated significant stenosis and dense calcification of the distal superficial femoral artery and the abductor canal, no significant focal stenosis of the right lower extremity, and no evidence of significant aortic or iliac arterial stenosis.  Patient reports being in her bedroom yesterday evening when she was walking to her bed and fell to the ground. She is not exactly sure how she fell as she did not trip on anything, but thinks her left leg gave out. She has had increasing pain in her left leg and lower back over the past 3-4 days. Denies preceding chest pain, palpitations, sob, dizziness, visual changes, or focal neuro deficits. No seizure like activity or incontinence. No loss of consciousness. She hit her head and injured her left hip during the fall. She was unable to stand up prompting her family to call EMS. She is normally able to do all ADLs without chest pain or sob. She does have occasional chest "fatigue" when walking fast. Denies recent illness, fever, change in bladder/bowel habits, melena/hematochezia.  In the ED, EKG showed sinus rhythm with chronic LBBB. CXR was without acute cardiopulmonary findings. Head CT was without acute intracranial findings. Hip xray showed Left femoral intertrochanteric fracture. Labs were significant for Glucose 124, WBC 15.2, Hgb 10.8, otherwise unremarkable CBC/BMET. She was admitted by the medicine service with plans for surgical repair today. Cardiology was asked to assist in preoperative risk evaluation. No hypoxia, tachypnea, or tachycardia. SBPs 110-150s (antihypertensives on hold) and heart rates 70-90s.    HOSPITAL COURSE:  #1 hip fracture status post intramedullary nailing she is status post open reduction internal fixation of the left hip by Dr. Lajoyce Corners on 01/21/2012, tolerated the procedure well except for blood loss related anemia, we'll continue to monitor, PT  and DVT prophylaxis per orthopedics for at least one month Will start Lovenox 40 subcutaneous daily until INR greater than 2 Please discontinue Lovenox when INR is greater than 2 She will need daily INR monitoring and q. 72 hours  2. history of hypothyroidism. No acute issues home dose Synthroid to be continued.   3. H/O dyslipidemia. Continue Zetia then outpatient followup by PCP.   4. HYPERTENSION - stable for now, was started on perioperative beta blocker, she is to resume her outpatient medications now  5. Mild leukocytosis. Likely reactionary from hip fracture, patient is afebrile, chest x-ray stable, UA is borderline for UTI, received  Rocephin for 3 doses,  6. Anemia accommodation of that loss during surgery and dilution from IV fluids. Threshold for transfusion will be 7 or symptoms, given the patient is symptomatic, lightheaded one unit of packed red blood cells was transfused during this hospitalization. Repeat CBCs pending at this time     Discharge Exam:  Blood pressure 121/50, pulse 90, temperature 98.6 F (37 C), temperature source Oral, resp. rate 16, height 5\' 7"  (1.702 m), weight 77.61 kg (171 lb 1.6 oz), SpO2 91.00%.  General: Well developed, elderly white female in no acute distress.  Head: Normocephalic, atraumatic, sclera non-icteric, no xanthomas, nares are without discharge.  Neck: Supple. Negative for carotid bruits or JVD  Lungs: Clear bilaterally to auscultation without wheezes, rales, or rhonchi. Breathing is unlabored.  Heart: RRR with S1 S2. 1/6 systolic murmur LUSB. No rubs or gallops appreciated.  Abdomen: Soft, non-tender, non-distended with normoactive bowel sounds. No rebound/guarding. No obvious abdominal masses.  Msk: Strength and tone appear normal for age.  Extremities: Left hip pain with movement. Left pedal pulse not palpable, foot warm to touch, good sensation. No clubbing or cyanosis. No edema. Right pedal pulse intact.  Neuro: Alert and oriented  X 3. Moves all extremities spontaneously.  Psych: Responds to questions appropriately with an anxious affect.        Discharge Orders    Future Appointments: Provider: Department: Dept Phone: Center:   04/28/2012 3:30 PM Newt Lukes, MD [facility] [phone] [facility]     Future Orders Please Complete By Expires   Weight bearing as tolerated         Follow-up Information    Follow up with DUDA,MARCUS V, MD. Schedule an appointment as soon as possible for a visit in 2 weeks.   Contact information:   [address] [place] [address] [phone]       Follow up with Rene Paci, MD. Schedule an appointment as soon as possible for a visit in 1 week. (CBC in one week)    Contact information:   [address] [place] [address] [phone]          Signed: Richarda Overlie 01/23/2012, 8:36 AM

## 2012-01-23 NOTE — Progress Notes (Signed)
Patient ID: Brittney Gates, female   DOB: 08-17-1924, 78 y.o.   MRN: [id number] Patient is stable from orthopedic standpoint for discharge to skilled nursing facility today.  Weightbearing status. Weightbearing as tolerated.  Physical therapy progressive ambulation.  DVT prophylaxis Coumadin 1 mg by mouth daily for one week.  Followup with Dr. Lajoyce Corners in 2 weeks.

## 2012-01-24 LAB — URINE CULTURE
Colony Count: NO GROWTH
Culture: NO GROWTH

## 2012-01-26 LAB — TYPE AND SCREEN
ABO/RH(D): O POS
Antibody Screen: NEGATIVE
Unit division: 0
Unit division: 0
Unit division: 0

## 2012-01-27 DIAGNOSIS — E039 Hypothyroidism, unspecified: Secondary | ICD-10-CM | POA: Diagnosis not present

## 2012-01-27 DIAGNOSIS — E119 Type 2 diabetes mellitus without complications: Secondary | ICD-10-CM | POA: Diagnosis not present

## 2012-01-27 DIAGNOSIS — D62 Acute posthemorrhagic anemia: Secondary | ICD-10-CM | POA: Diagnosis not present

## 2012-01-27 DIAGNOSIS — E7800 Pure hypercholesterolemia, unspecified: Secondary | ICD-10-CM | POA: Diagnosis not present

## 2012-01-27 DIAGNOSIS — I10 Essential (primary) hypertension: Secondary | ICD-10-CM | POA: Diagnosis not present

## 2012-01-27 DIAGNOSIS — Z7901 Long term (current) use of anticoagulants: Secondary | ICD-10-CM | POA: Diagnosis not present

## 2012-01-28 DIAGNOSIS — E039 Hypothyroidism, unspecified: Secondary | ICD-10-CM | POA: Diagnosis not present

## 2012-01-28 DIAGNOSIS — D62 Acute posthemorrhagic anemia: Secondary | ICD-10-CM | POA: Diagnosis not present

## 2012-01-28 DIAGNOSIS — S7290XA Unspecified fracture of unspecified femur, initial encounter for closed fracture: Secondary | ICD-10-CM | POA: Diagnosis not present

## 2012-01-28 DIAGNOSIS — I10 Essential (primary) hypertension: Secondary | ICD-10-CM | POA: Diagnosis not present

## 2012-01-28 DIAGNOSIS — E7800 Pure hypercholesterolemia, unspecified: Secondary | ICD-10-CM | POA: Diagnosis not present

## 2012-01-29 ENCOUNTER — Other Ambulatory Visit (HOSPITAL_COMMUNITY): Payer: Self-pay | Admitting: *Deleted

## 2012-01-30 ENCOUNTER — Ambulatory Visit (HOSPITAL_COMMUNITY)
Admission: RE | Admit: 2012-01-30 | Discharge: 2012-01-30 | Disposition: A | Discharge status: Home / Self Care | Payer: Medicare Other | Source: Ambulatory Visit | Attending: Internal Medicine | Admitting: Internal Medicine

## 2012-01-30 DIAGNOSIS — E119 Type 2 diabetes mellitus without complications: Secondary | ICD-10-CM | POA: Diagnosis not present

## 2012-01-30 DIAGNOSIS — E039 Hypothyroidism, unspecified: Secondary | ICD-10-CM | POA: Diagnosis not present

## 2012-01-30 DIAGNOSIS — D649 Anemia, unspecified: Secondary | ICD-10-CM | POA: Insufficient documentation

## 2012-01-30 DIAGNOSIS — I10 Essential (primary) hypertension: Secondary | ICD-10-CM | POA: Diagnosis not present

## 2012-01-30 LAB — PREPARE RBC (CROSSMATCH)

## 2012-01-30 NOTE — Progress Notes (Addendum)
0810 lungs clear and breath sounds equal bilaterally  States she has had all her am medications

## 2012-01-31 LAB — TYPE AND SCREEN
ABO/RH(D): O POS
Antibody Screen: NEGATIVE
Unit division: 0
Unit division: 0

## 2012-02-03 DIAGNOSIS — Z7901 Long term (current) use of anticoagulants: Secondary | ICD-10-CM | POA: Diagnosis not present

## 2012-02-03 DIAGNOSIS — I10 Essential (primary) hypertension: Secondary | ICD-10-CM | POA: Diagnosis not present

## 2012-02-03 DIAGNOSIS — S72143A Displaced intertrochanteric fracture of unspecified femur, initial encounter for closed fracture: Secondary | ICD-10-CM | POA: Diagnosis not present

## 2012-02-03 DIAGNOSIS — K5900 Constipation, unspecified: Secondary | ICD-10-CM | POA: Diagnosis not present

## 2012-02-03 DIAGNOSIS — S7290XA Unspecified fracture of unspecified femur, initial encounter for closed fracture: Secondary | ICD-10-CM | POA: Diagnosis not present

## 2012-02-03 DIAGNOSIS — E119 Type 2 diabetes mellitus without complications: Secondary | ICD-10-CM | POA: Diagnosis not present

## 2012-02-06 DIAGNOSIS — S7290XA Unspecified fracture of unspecified femur, initial encounter for closed fracture: Secondary | ICD-10-CM | POA: Diagnosis not present

## 2012-02-06 DIAGNOSIS — Z7901 Long term (current) use of anticoagulants: Secondary | ICD-10-CM | POA: Diagnosis not present

## 2012-02-06 DIAGNOSIS — I10 Essential (primary) hypertension: Secondary | ICD-10-CM | POA: Diagnosis not present

## 2012-02-06 DIAGNOSIS — E119 Type 2 diabetes mellitus without complications: Secondary | ICD-10-CM | POA: Diagnosis not present

## 2012-02-10 DIAGNOSIS — I10 Essential (primary) hypertension: Secondary | ICD-10-CM | POA: Diagnosis not present

## 2012-02-10 DIAGNOSIS — Z7901 Long term (current) use of anticoagulants: Secondary | ICD-10-CM | POA: Diagnosis not present

## 2012-02-10 DIAGNOSIS — E119 Type 2 diabetes mellitus without complications: Secondary | ICD-10-CM | POA: Diagnosis not present

## 2012-02-10 DIAGNOSIS — S7290XA Unspecified fracture of unspecified femur, initial encounter for closed fracture: Secondary | ICD-10-CM | POA: Diagnosis not present

## 2012-02-13 DIAGNOSIS — I10 Essential (primary) hypertension: Secondary | ICD-10-CM | POA: Diagnosis not present

## 2012-02-13 DIAGNOSIS — Z7901 Long term (current) use of anticoagulants: Secondary | ICD-10-CM | POA: Diagnosis not present

## 2012-02-13 DIAGNOSIS — S7290XA Unspecified fracture of unspecified femur, initial encounter for closed fracture: Secondary | ICD-10-CM | POA: Diagnosis not present

## 2012-02-13 DIAGNOSIS — E119 Type 2 diabetes mellitus without complications: Secondary | ICD-10-CM | POA: Diagnosis not present

## 2012-02-17 DIAGNOSIS — E7800 Pure hypercholesterolemia, unspecified: Secondary | ICD-10-CM | POA: Diagnosis not present

## 2012-02-17 DIAGNOSIS — S7290XA Unspecified fracture of unspecified femur, initial encounter for closed fracture: Secondary | ICD-10-CM | POA: Diagnosis not present

## 2012-02-17 DIAGNOSIS — I10 Essential (primary) hypertension: Secondary | ICD-10-CM | POA: Diagnosis not present

## 2012-02-17 DIAGNOSIS — Z7901 Long term (current) use of anticoagulants: Secondary | ICD-10-CM | POA: Diagnosis not present

## 2012-02-18 DIAGNOSIS — M1710 Unilateral primary osteoarthritis, unspecified knee: Secondary | ICD-10-CM | POA: Diagnosis not present

## 2012-02-21 DIAGNOSIS — E039 Hypothyroidism, unspecified: Secondary | ICD-10-CM | POA: Diagnosis not present

## 2012-02-21 DIAGNOSIS — E119 Type 2 diabetes mellitus without complications: Secondary | ICD-10-CM | POA: Diagnosis not present

## 2012-02-21 DIAGNOSIS — E7800 Pure hypercholesterolemia, unspecified: Secondary | ICD-10-CM | POA: Diagnosis not present

## 2012-02-21 DIAGNOSIS — R748 Abnormal levels of other serum enzymes: Secondary | ICD-10-CM | POA: Diagnosis not present

## 2012-02-21 DIAGNOSIS — K5900 Constipation, unspecified: Secondary | ICD-10-CM | POA: Diagnosis not present

## 2012-02-21 DIAGNOSIS — S7290XA Unspecified fracture of unspecified femur, initial encounter for closed fracture: Secondary | ICD-10-CM | POA: Diagnosis not present

## 2012-02-22 ENCOUNTER — Other Ambulatory Visit: Payer: Self-pay | Admitting: Endocrinology

## 2012-02-22 ENCOUNTER — Other Ambulatory Visit: Payer: Self-pay | Admitting: Internal Medicine

## 2012-02-23 DIAGNOSIS — I10 Essential (primary) hypertension: Secondary | ICD-10-CM | POA: Diagnosis not present

## 2012-02-23 DIAGNOSIS — Z7901 Long term (current) use of anticoagulants: Secondary | ICD-10-CM | POA: Diagnosis not present

## 2012-02-23 DIAGNOSIS — M62838 Other muscle spasm: Secondary | ICD-10-CM | POA: Diagnosis not present

## 2012-02-23 DIAGNOSIS — S7290XA Unspecified fracture of unspecified femur, initial encounter for closed fracture: Secondary | ICD-10-CM | POA: Diagnosis not present

## 2012-02-23 DIAGNOSIS — E7800 Pure hypercholesterolemia, unspecified: Secondary | ICD-10-CM | POA: Diagnosis not present

## 2012-02-26 DIAGNOSIS — I10 Essential (primary) hypertension: Secondary | ICD-10-CM | POA: Diagnosis not present

## 2012-02-26 DIAGNOSIS — Z7901 Long term (current) use of anticoagulants: Secondary | ICD-10-CM | POA: Diagnosis not present

## 2012-02-26 DIAGNOSIS — S7290XA Unspecified fracture of unspecified femur, initial encounter for closed fracture: Secondary | ICD-10-CM | POA: Diagnosis not present

## 2012-02-26 DIAGNOSIS — E7800 Pure hypercholesterolemia, unspecified: Secondary | ICD-10-CM | POA: Diagnosis not present

## 2012-03-02 DIAGNOSIS — S7290XA Unspecified fracture of unspecified femur, initial encounter for closed fracture: Secondary | ICD-10-CM | POA: Diagnosis not present

## 2012-03-02 DIAGNOSIS — Z7901 Long term (current) use of anticoagulants: Secondary | ICD-10-CM | POA: Diagnosis not present

## 2012-03-02 DIAGNOSIS — D62 Acute posthemorrhagic anemia: Secondary | ICD-10-CM | POA: Diagnosis not present

## 2012-03-02 DIAGNOSIS — I10 Essential (primary) hypertension: Secondary | ICD-10-CM | POA: Diagnosis not present

## 2012-03-04 ENCOUNTER — Other Ambulatory Visit: Payer: Self-pay | Admitting: Internal Medicine

## 2012-03-05 NOTE — Telephone Encounter (Signed)
Faxed script back to [facility].../lmb 

## 2012-03-09 DIAGNOSIS — S7290XA Unspecified fracture of unspecified femur, initial encounter for closed fracture: Secondary | ICD-10-CM | POA: Diagnosis not present

## 2012-03-09 DIAGNOSIS — M25559 Pain in unspecified hip: Secondary | ICD-10-CM | POA: Diagnosis not present

## 2012-03-09 DIAGNOSIS — D62 Acute posthemorrhagic anemia: Secondary | ICD-10-CM | POA: Diagnosis not present

## 2012-03-09 DIAGNOSIS — I10 Essential (primary) hypertension: Secondary | ICD-10-CM | POA: Diagnosis not present

## 2012-03-09 DIAGNOSIS — Z7901 Long term (current) use of anticoagulants: Secondary | ICD-10-CM | POA: Diagnosis not present

## 2012-03-19 DIAGNOSIS — E039 Hypothyroidism, unspecified: Secondary | ICD-10-CM | POA: Diagnosis not present

## 2012-03-19 DIAGNOSIS — D62 Acute posthemorrhagic anemia: Secondary | ICD-10-CM | POA: Diagnosis not present

## 2012-03-19 DIAGNOSIS — S7290XA Unspecified fracture of unspecified femur, initial encounter for closed fracture: Secondary | ICD-10-CM | POA: Diagnosis not present

## 2012-03-19 DIAGNOSIS — E7800 Pure hypercholesterolemia, unspecified: Secondary | ICD-10-CM | POA: Diagnosis not present

## 2012-03-19 DIAGNOSIS — E119 Type 2 diabetes mellitus without complications: Secondary | ICD-10-CM | POA: Diagnosis not present

## 2012-03-19 DIAGNOSIS — I10 Essential (primary) hypertension: Secondary | ICD-10-CM | POA: Diagnosis not present

## 2012-03-26 DIAGNOSIS — R269 Unspecified abnormalities of gait and mobility: Secondary | ICD-10-CM | POA: Diagnosis not present

## 2012-03-26 DIAGNOSIS — M545 Low back pain, unspecified: Secondary | ICD-10-CM | POA: Diagnosis not present

## 2012-03-26 DIAGNOSIS — I10 Essential (primary) hypertension: Secondary | ICD-10-CM | POA: Diagnosis not present

## 2012-03-26 DIAGNOSIS — E119 Type 2 diabetes mellitus without complications: Secondary | ICD-10-CM | POA: Diagnosis not present

## 2012-03-26 DIAGNOSIS — S72009D Fracture of unspecified part of neck of unspecified femur, subsequent encounter for closed fracture with routine healing: Secondary | ICD-10-CM | POA: Diagnosis not present

## 2012-03-30 DIAGNOSIS — S72009D Fracture of unspecified part of neck of unspecified femur, subsequent encounter for closed fracture with routine healing: Secondary | ICD-10-CM | POA: Diagnosis not present

## 2012-03-30 DIAGNOSIS — I10 Essential (primary) hypertension: Secondary | ICD-10-CM | POA: Diagnosis not present

## 2012-03-30 DIAGNOSIS — E119 Type 2 diabetes mellitus without complications: Secondary | ICD-10-CM | POA: Diagnosis not present

## 2012-03-30 DIAGNOSIS — M545 Low back pain, unspecified: Secondary | ICD-10-CM | POA: Diagnosis not present

## 2012-03-30 DIAGNOSIS — R269 Unspecified abnormalities of gait and mobility: Secondary | ICD-10-CM | POA: Diagnosis not present

## 2012-04-01 DIAGNOSIS — R269 Unspecified abnormalities of gait and mobility: Secondary | ICD-10-CM | POA: Diagnosis not present

## 2012-04-01 DIAGNOSIS — S72009D Fracture of unspecified part of neck of unspecified femur, subsequent encounter for closed fracture with routine healing: Secondary | ICD-10-CM | POA: Diagnosis not present

## 2012-04-01 DIAGNOSIS — M545 Low back pain, unspecified: Secondary | ICD-10-CM | POA: Diagnosis not present

## 2012-04-01 DIAGNOSIS — I10 Essential (primary) hypertension: Secondary | ICD-10-CM | POA: Diagnosis not present

## 2012-04-01 DIAGNOSIS — E119 Type 2 diabetes mellitus without complications: Secondary | ICD-10-CM | POA: Diagnosis not present

## 2012-04-06 DIAGNOSIS — R269 Unspecified abnormalities of gait and mobility: Secondary | ICD-10-CM | POA: Diagnosis not present

## 2012-04-06 DIAGNOSIS — S72009D Fracture of unspecified part of neck of unspecified femur, subsequent encounter for closed fracture with routine healing: Secondary | ICD-10-CM | POA: Diagnosis not present

## 2012-04-06 DIAGNOSIS — M545 Low back pain, unspecified: Secondary | ICD-10-CM | POA: Diagnosis not present

## 2012-04-06 DIAGNOSIS — E119 Type 2 diabetes mellitus without complications: Secondary | ICD-10-CM | POA: Diagnosis not present

## 2012-04-06 DIAGNOSIS — I10 Essential (primary) hypertension: Secondary | ICD-10-CM | POA: Diagnosis not present

## 2012-04-08 DIAGNOSIS — R269 Unspecified abnormalities of gait and mobility: Secondary | ICD-10-CM | POA: Diagnosis not present

## 2012-04-08 DIAGNOSIS — M545 Low back pain, unspecified: Secondary | ICD-10-CM | POA: Diagnosis not present

## 2012-04-08 DIAGNOSIS — E119 Type 2 diabetes mellitus without complications: Secondary | ICD-10-CM | POA: Diagnosis not present

## 2012-04-08 DIAGNOSIS — I10 Essential (primary) hypertension: Secondary | ICD-10-CM | POA: Diagnosis not present

## 2012-04-08 DIAGNOSIS — S72009D Fracture of unspecified part of neck of unspecified femur, subsequent encounter for closed fracture with routine healing: Secondary | ICD-10-CM | POA: Diagnosis not present

## 2012-04-12 DIAGNOSIS — H9209 Otalgia, unspecified ear: Secondary | ICD-10-CM | POA: Diagnosis not present

## 2012-04-12 DIAGNOSIS — R490 Dysphonia: Secondary | ICD-10-CM | POA: Diagnosis not present

## 2012-04-13 DIAGNOSIS — M545 Low back pain, unspecified: Secondary | ICD-10-CM | POA: Diagnosis not present

## 2012-04-13 DIAGNOSIS — E119 Type 2 diabetes mellitus without complications: Secondary | ICD-10-CM | POA: Diagnosis not present

## 2012-04-13 DIAGNOSIS — I10 Essential (primary) hypertension: Secondary | ICD-10-CM | POA: Diagnosis not present

## 2012-04-13 DIAGNOSIS — R269 Unspecified abnormalities of gait and mobility: Secondary | ICD-10-CM | POA: Diagnosis not present

## 2012-04-13 DIAGNOSIS — S72009D Fracture of unspecified part of neck of unspecified femur, subsequent encounter for closed fracture with routine healing: Secondary | ICD-10-CM | POA: Diagnosis not present

## 2012-04-15 DIAGNOSIS — R269 Unspecified abnormalities of gait and mobility: Secondary | ICD-10-CM | POA: Diagnosis not present

## 2012-04-15 DIAGNOSIS — M545 Low back pain, unspecified: Secondary | ICD-10-CM | POA: Diagnosis not present

## 2012-04-15 DIAGNOSIS — I10 Essential (primary) hypertension: Secondary | ICD-10-CM | POA: Diagnosis not present

## 2012-04-15 DIAGNOSIS — S72009D Fracture of unspecified part of neck of unspecified femur, subsequent encounter for closed fracture with routine healing: Secondary | ICD-10-CM | POA: Diagnosis not present

## 2012-04-15 DIAGNOSIS — E119 Type 2 diabetes mellitus without complications: Secondary | ICD-10-CM | POA: Diagnosis not present

## 2012-04-20 DIAGNOSIS — S72009D Fracture of unspecified part of neck of unspecified femur, subsequent encounter for closed fracture with routine healing: Secondary | ICD-10-CM | POA: Diagnosis not present

## 2012-04-20 DIAGNOSIS — E119 Type 2 diabetes mellitus without complications: Secondary | ICD-10-CM | POA: Diagnosis not present

## 2012-04-20 DIAGNOSIS — M545 Low back pain, unspecified: Secondary | ICD-10-CM | POA: Diagnosis not present

## 2012-04-20 DIAGNOSIS — R269 Unspecified abnormalities of gait and mobility: Secondary | ICD-10-CM | POA: Diagnosis not present

## 2012-04-20 DIAGNOSIS — I10 Essential (primary) hypertension: Secondary | ICD-10-CM | POA: Diagnosis not present

## 2012-04-22 DIAGNOSIS — I10 Essential (primary) hypertension: Secondary | ICD-10-CM | POA: Diagnosis not present

## 2012-04-22 DIAGNOSIS — E119 Type 2 diabetes mellitus without complications: Secondary | ICD-10-CM | POA: Diagnosis not present

## 2012-04-22 DIAGNOSIS — S72009D Fracture of unspecified part of neck of unspecified femur, subsequent encounter for closed fracture with routine healing: Secondary | ICD-10-CM | POA: Diagnosis not present

## 2012-04-22 DIAGNOSIS — M545 Low back pain, unspecified: Secondary | ICD-10-CM | POA: Diagnosis not present

## 2012-04-22 DIAGNOSIS — R269 Unspecified abnormalities of gait and mobility: Secondary | ICD-10-CM | POA: Diagnosis not present

## 2012-04-26 DIAGNOSIS — M719 Bursopathy, unspecified: Secondary | ICD-10-CM | POA: Diagnosis not present

## 2012-04-26 DIAGNOSIS — M25579 Pain in unspecified ankle and joints of unspecified foot: Secondary | ICD-10-CM | POA: Diagnosis not present

## 2012-04-26 DIAGNOSIS — M67919 Unspecified disorder of synovium and tendon, unspecified shoulder: Secondary | ICD-10-CM | POA: Diagnosis not present

## 2012-04-26 DIAGNOSIS — M169 Osteoarthritis of hip, unspecified: Secondary | ICD-10-CM | POA: Diagnosis not present

## 2012-04-27 DIAGNOSIS — S72009D Fracture of unspecified part of neck of unspecified femur, subsequent encounter for closed fracture with routine healing: Secondary | ICD-10-CM | POA: Diagnosis not present

## 2012-04-27 DIAGNOSIS — I10 Essential (primary) hypertension: Secondary | ICD-10-CM | POA: Diagnosis not present

## 2012-04-27 DIAGNOSIS — M545 Low back pain, unspecified: Secondary | ICD-10-CM | POA: Diagnosis not present

## 2012-04-27 DIAGNOSIS — R269 Unspecified abnormalities of gait and mobility: Secondary | ICD-10-CM | POA: Diagnosis not present

## 2012-04-27 DIAGNOSIS — E119 Type 2 diabetes mellitus without complications: Secondary | ICD-10-CM | POA: Diagnosis not present

## 2012-04-28 ENCOUNTER — Ambulatory Visit: Payer: Medicare Other | Admitting: Internal Medicine

## 2012-04-29 DIAGNOSIS — I10 Essential (primary) hypertension: Secondary | ICD-10-CM | POA: Diagnosis not present

## 2012-04-29 DIAGNOSIS — R269 Unspecified abnormalities of gait and mobility: Secondary | ICD-10-CM | POA: Diagnosis not present

## 2012-04-29 DIAGNOSIS — S72009D Fracture of unspecified part of neck of unspecified femur, subsequent encounter for closed fracture with routine healing: Secondary | ICD-10-CM | POA: Diagnosis not present

## 2012-04-29 DIAGNOSIS — E119 Type 2 diabetes mellitus without complications: Secondary | ICD-10-CM | POA: Diagnosis not present

## 2012-04-29 DIAGNOSIS — M545 Low back pain, unspecified: Secondary | ICD-10-CM | POA: Diagnosis not present

## 2012-04-30 ENCOUNTER — Ambulatory Visit (INDEPENDENT_AMBULATORY_CARE_PROVIDER_SITE_OTHER): Payer: Medicare Other | Admitting: Internal Medicine

## 2012-04-30 ENCOUNTER — Other Ambulatory Visit (INDEPENDENT_AMBULATORY_CARE_PROVIDER_SITE_OTHER): Payer: Medicare Other

## 2012-04-30 ENCOUNTER — Encounter: Payer: Self-pay | Admitting: Internal Medicine

## 2012-04-30 VITALS — BP 118/60 | HR 79 | Temp 97.9°F | Resp 10 | Wt 163.1 lb

## 2012-04-30 DIAGNOSIS — D62 Acute posthemorrhagic anemia: Secondary | ICD-10-CM

## 2012-04-30 DIAGNOSIS — I10 Essential (primary) hypertension: Secondary | ICD-10-CM

## 2012-04-30 DIAGNOSIS — R300 Dysuria: Secondary | ICD-10-CM

## 2012-04-30 DIAGNOSIS — E039 Hypothyroidism, unspecified: Secondary | ICD-10-CM

## 2012-04-30 DIAGNOSIS — S72009A Fracture of unspecified part of neck of unspecified femur, initial encounter for closed fracture: Secondary | ICD-10-CM

## 2012-04-30 DIAGNOSIS — R918 Other nonspecific abnormal finding of lung field: Secondary | ICD-10-CM

## 2012-04-30 DIAGNOSIS — R9389 Abnormal findings on diagnostic imaging of other specified body structures: Secondary | ICD-10-CM

## 2012-04-30 DIAGNOSIS — M810 Age-related osteoporosis without current pathological fracture: Secondary | ICD-10-CM

## 2012-04-30 LAB — CBC WITH DIFFERENTIAL/PLATELET
Basophils Absolute: 0.1 10*3/uL (ref 0.0–0.1)
Basophils Relative: 1.1 % (ref 0.0–3.0)
Eosinophils Absolute: 0.1 10*3/uL (ref 0.0–0.7)
Eosinophils Relative: 1.4 % (ref 0.0–5.0)
HCT: 33.3 % — ABNORMAL LOW (ref 36.0–46.0)
Hemoglobin: 11 g/dL — ABNORMAL LOW (ref 12.0–15.0)
Lymphocytes Relative: 39.9 % (ref 12.0–46.0)
Lymphs Abs: 3.5 10*3/uL (ref 0.7–4.0)
MCHC: 33.2 g/dL (ref 30.0–36.0)
MCV: 101.4 fl — ABNORMAL HIGH (ref 78.0–100.0)
Monocytes Absolute: 0.6 10*3/uL (ref 0.1–1.0)
Monocytes Relative: 6.9 % (ref 3.0–12.0)
Neutro Abs: 4.4 10*3/uL (ref 1.4–7.7)
Neutrophils Relative %: 50.7 % (ref 43.0–77.0)
Platelets: 267 10*3/uL (ref 150.0–400.0)
RBC: 3.28 Mil/uL — ABNORMAL LOW (ref 3.87–5.11)
RDW: 23.2 % — ABNORMAL HIGH (ref 11.5–14.6)
WBC: 8.7 10*3/uL (ref 4.5–10.5)

## 2012-04-30 LAB — BASIC METABOLIC PANEL
BUN: 27 mg/dL — ABNORMAL HIGH (ref 6–23)
CO2: 30 mEq/L (ref 19–32)
Calcium: 9.1 mg/dL (ref 8.4–10.5)
Chloride: 99 mEq/L (ref 96–112)
Creatinine, Ser: 0.8 mg/dL (ref 0.4–1.2)
GFR: 73.41 mL/min (ref >60.00)
Glucose, Bld: 95 mg/dL (ref 70–99)
Potassium: 4.4 mEq/L (ref 3.5–5.1)
Sodium: 135 mEq/L (ref 135–145)

## 2012-04-30 LAB — TSH: TSH: 0.1 u[IU]/mL — ABNORMAL LOW (ref 0.35–5.50)

## 2012-04-30 LAB — URINALYSIS, ROUTINE W REFLEX MICROSCOPIC
Bilirubin Urine: NEGATIVE
Hgb urine dipstick: NEGATIVE
Ketones, ur: NEGATIVE
Nitrite: NEGATIVE
Specific Gravity, Urine: 1.015 (ref 1.000–1.030)
Total Protein, Urine: NEGATIVE
Urine Glucose: NEGATIVE
Urobilinogen, UA: 0.2 (ref 0.0–1.0)
pH: 6 (ref 5.0–8.0)

## 2012-04-30 MED ORDER — NAPROXEN 500 MG PO TABS: 500.0000 mg | ORAL_TABLET | Freq: Two times a day (BID) | ORAL | Status: DC

## 2012-04-30 NOTE — Patient Instructions (Addendum)
It was good to see you today. We have reviewed your prior records including labs and tests today Medications reviewed and updated - refill on Naprosyn - Your prescription(s) have been submitted to your pharmacy. Please take as directed and contact our office if you believe you are having problem(s) with the medication(s). Test(s) ordered today. Your results will be released to MyChart (or called to you) after review, usually within 72hours after test completion. If any changes need to be made, you will be notified at that same time. we'll make referral for bone density and for CT scan as requested by Dr Lajoyce Corners. Our office will contact you regarding appointment(s) once made. Please schedule followup in 6 months, call sooner if problems.

## 2012-04-30 NOTE — Assessment & Plan Note (Signed)
Check TSH and adjust as needed Pt/son understand and agree to same  Lab Results  Component Value Date   TSH 2.44 11/04/2011

## 2012-04-30 NOTE — Assessment & Plan Note (Signed)
L IT fracture 12/2011 with accidental fall Has recovered no longer in PT, uses RW constantly Check DEXA and consider osteoporosis therapy as needed

## 2012-04-30 NOTE — Assessment & Plan Note (Signed)
BP Readings from Last 3 Encounters:  04/30/12 118/60  01/30/12 152/61  01/23/12 111/54   The current medical regimen is effective;  continue present plan and medications.

## 2012-04-30 NOTE — Progress Notes (Signed)
Subjective:    Patient ID: Brittney Gates, female    DOB: 1924/10/03, 79 y.o.   MRN: [id number]  HPI Here for follow up - reviewed chronic medical issues:  Also see CC concerns Hospitalization October 2013 reviewed for left IT hip fracture following accidental fall -reports she is nearly at baseline, ambulates with use of rolling walker constantly, completed physical therapy course. No ongoing pain or muscle spasm. No longer following with orthopedist for same  HTN - reports compliance with ongoing medical treatment and no changes in medication dose or frequency. denies adverse side effects related to current therapy.   dyslipidemia - variable compliance with zetia due to $, declines statin - ?need to take any meds  hypothyroid - reports compliance with ongoing medical treatment and no changes in medication dose or frequency. denies adverse side effects related to current therapy.   Anxiety, chronic - manifest with insomnia and rumination thoughs at bedtime - use soma or Ambien as needed to control same   Past Medical History  Diagnosis Date  . DIABETES MELLITUS, BORDERLINE     A1c 6.0 in 2011, Diet controlled  . LOW BACK PAIN, CHRONIC   . Diverticulosis   . ANEMIA   . ANXIETY   . Arthritis   . HYPERTENSION   . HYPERLIPIDEMIA   . HYPOTHYROIDISM   . PAD (peripheral artery disease)      Review of Systems  Constitutional: Positive for fatigue. Negative for fever and appetite change.  HENT: Positive for ear pain.   Respiratory: Negative for cough and shortness of breath.   Cardiovascular: Negative for chest pain and palpitations.  Musculoskeletal: Positive for back pain and gait problem. Negative for joint swelling.       Leg pain/numbness R distal>L proximal  Neurological: Negative for dizziness, syncope, facial asymmetry and headaches.  Psychiatric/Behavioral: The patient is nervous/anxious.        Objective:   Physical Exam  BP 118/60  Pulse 79  Temp 97.9 F (36.6 C)  (Oral)  Resp 10  Wt 163 lb 1.3 oz (73.973 kg)  SpO2 98% Wt Readings from Last 3 Encounters:  04/30/12 163 lb 1.3 oz (73.973 kg)  01/30/12 171 lb (77.565 kg)  01/21/12 171 lb 1.6 oz (77.61 kg)   Constitutional: She is large framed older woman;  appears well-developed and well-nourished. No distress. [name] is at side HENT: nontender, B TMs clear without erythema or effusion - OP clear Cardiovascular: Normal rate, regular rhythm and normal heart sounds.  No murmur heard. No BLE edema. Pulmonary/Chest: Effort normal and breath sounds normal. No respiratory distress. She has no wheezes.  Neurologic: AAOx4, CN 2-12 symmetrically intact - speech fluent - no dysarthria - follows commands, good recall- surprisingly normal gait/balance with use of RW Psychiatric: She has a slightly anxious mood and affect. Her behavior is normal. Judgment and thought content normal.   Lab Results  Component Value Date   WBC 11.6* 01/23/2012   HGB 7.7* 01/23/2012   HCT 23.0* 01/23/2012   PLT 131* 01/23/2012   CHOL 184 11/04/2011   TRIG 96.0 11/04/2011   HDL 51.30 11/04/2011   LDLDIRECT 153.3 09/09/2010   ALT 19 03/29/2010   AST 20 03/29/2010   NA 134* 01/23/2012   K 4.1 01/23/2012   CL 101 01/23/2012   CREATININE 1.09 01/23/2012   BUN 21 01/23/2012   CO2 27 01/23/2012   TSH 2.44 11/04/2011   INR 1.55* 01/23/2012   HGBA1C 6.0 03/29/2010  Assessment & Plan:  See problem list. Medications and labs reviewed today.  L ear tinnitus with change in voice quality - chronic symptoms without change in past 6 months - exam benign - follow up ENT as needed, prior eval by Ezzard Standing for same unremarkable - The patient is reassured that these symptoms do not appear to represent a serious or threatening condition.  ABL anemia - suspect related to fx/op repair - HD stable - no symptoms of GI loss - recheck CBC now  Abnormal CXR per ortho note, ?LUL mass vs shadow - will check CT chest per ortho request to further  eval same  Dysuria - check UA and tx if evidence for UTI   Time spent with pt/family today 35 minutes, greater than 50% time spent counseling patient on osteoporosis with L hip fracture 12/2011, ABL anemia, ENT symptoms and medication review. Also review of prior records

## 2012-05-03 ENCOUNTER — Encounter: Payer: Self-pay | Admitting: *Deleted

## 2012-05-03 ENCOUNTER — Telehealth: Payer: Self-pay | Admitting: *Deleted

## 2012-05-03 NOTE — Telephone Encounter (Signed)
Brittney Gates, She should take ferrous sulfate 325 mg daily. [name]

## 2012-05-03 NOTE — Telephone Encounter (Signed)
Called pt concerning labs. MD recommend for pt to continue taking iron supplements. Pt states she has never taking iron pills. Needing to know what strength & how often to take med...Raechel Chute

## 2012-05-03 NOTE — Telephone Encounter (Signed)
Notified pt with Regina response...lmb 

## 2012-05-05 ENCOUNTER — Other Ambulatory Visit: Payer: Self-pay | Admitting: *Deleted

## 2012-05-05 MED ORDER — ZOLPIDEM TARTRATE 10 MG PO TABS: 10.0000 mg | ORAL_TABLET | Freq: Every evening | ORAL | Status: DC | PRN

## 2012-05-05 NOTE — Telephone Encounter (Signed)
Faxed script back to [facility].../lmb 

## 2012-05-06 ENCOUNTER — Ambulatory Visit (INDEPENDENT_AMBULATORY_CARE_PROVIDER_SITE_OTHER)
Admission: RE | Admit: 2012-05-06 | Discharge: 2012-05-06 | Disposition: A | Discharge status: Home / Self Care | Payer: Medicare Other | Source: Ambulatory Visit | Attending: Internal Medicine | Admitting: Internal Medicine

## 2012-05-06 ENCOUNTER — Encounter: Payer: Self-pay | Admitting: Internal Medicine

## 2012-05-06 DIAGNOSIS — R9389 Abnormal findings on diagnostic imaging of other specified body structures: Secondary | ICD-10-CM | POA: Insufficient documentation

## 2012-05-06 DIAGNOSIS — R918 Other nonspecific abnormal finding of lung field: Secondary | ICD-10-CM

## 2012-05-06 MED ORDER — IOHEXOL 300 MG/ML  SOLN
80.0000 mL | Freq: Once | INTRAMUSCULAR | Status: AC | PRN
  Administered 2012-05-06: 80 mL via INTRAVENOUS

## 2012-05-10 ENCOUNTER — Ambulatory Visit: Payer: Medicare Other | Admitting: Internal Medicine

## 2012-05-13 ENCOUNTER — Inpatient Hospital Stay: Admission: RE | Admit: 2012-05-13 | Payer: Medicare Other | Source: Ambulatory Visit

## 2012-05-17 ENCOUNTER — Telehealth: Payer: Self-pay | Admitting: Internal Medicine

## 2012-05-17 ENCOUNTER — Ambulatory Visit (INDEPENDENT_AMBULATORY_CARE_PROVIDER_SITE_OTHER)
Admission: RE | Admit: 2012-05-17 | Discharge: 2012-05-17 | Disposition: A | Discharge status: Home / Self Care | Payer: Medicare Other | Source: Ambulatory Visit | Attending: Internal Medicine | Admitting: Internal Medicine

## 2012-05-17 ENCOUNTER — Ambulatory Visit (INDEPENDENT_AMBULATORY_CARE_PROVIDER_SITE_OTHER): Payer: Medicare Other | Admitting: Internal Medicine

## 2012-05-17 ENCOUNTER — Encounter: Payer: Self-pay | Admitting: Internal Medicine

## 2012-05-17 VITALS — BP 110/68 | HR 75 | Temp 96.9°F

## 2012-05-17 DIAGNOSIS — M810 Age-related osteoporosis without current pathological fracture: Secondary | ICD-10-CM

## 2012-05-17 DIAGNOSIS — R9389 Abnormal findings on diagnostic imaging of other specified body structures: Secondary | ICD-10-CM

## 2012-05-17 DIAGNOSIS — S72009A Fracture of unspecified part of neck of unspecified femur, initial encounter for closed fracture: Secondary | ICD-10-CM | POA: Diagnosis not present

## 2012-05-17 DIAGNOSIS — I10 Essential (primary) hypertension: Secondary | ICD-10-CM

## 2012-05-17 NOTE — Progress Notes (Signed)
Subjective:    Patient ID: Brittney Gates, female    DOB: 1924-12-16, 79 y.o.   MRN: [id number]  HPI  Here for follow up - ?abnormal CT chest 05/2012 - ?next steps  Past Medical History  Diagnosis Date  . DIABETES MELLITUS, BORDERLINE     A1c 6.0 in 2011, Diet controlled  . LOW BACK PAIN, CHRONIC   . Diverticulosis   . ANEMIA   . ANXIETY   . Arthritis   . HYPERTENSION   . HYPERLIPIDEMIA   . HYPOTHYROIDISM   . PAD (peripheral artery disease)      Review of Systems  Constitutional: Positive for fatigue. Negative for fever.  Respiratory: Negative for cough and shortness of breath.        Objective:   Physical Exam  BP 110/68  Pulse 75  Temp(Src) 96.9 F (36.1 C) (Oral)  SpO2 98% Wt Readings from Last 3 Encounters:  04/30/12 163 lb 1.3 oz (73.973 kg)  01/30/12 171 lb (77.565 kg)  01/21/12 171 lb 1.6 oz (77.61 kg)   Constitutional: She is large framed older woman;  appears well-developed and well-nourished. No distress. [name] is at side Cardiovascular: Normal rate, regular rhythm and normal heart sounds.  No murmur heard. No BLE edema. Pulmonary/Chest: Effort normal and breath sounds normal. No respiratory distress. She has no wheezes.  Psychiatric: She has a slightly anxious mood and affect. Her behavior is normal. Judgment and thought content normal.   Lab Results  Component Value Date   WBC 8.7 04/30/2012   HGB 11.0* 04/30/2012   HCT 33.3* 04/30/2012   PLT 267.0 04/30/2012   CHOL 184 11/04/2011   TRIG 96.0 11/04/2011   HDL 51.30 11/04/2011   LDLDIRECT 153.3 09/09/2010   ALT 19 03/29/2010   AST 20 03/29/2010   NA 135 04/30/2012   K 4.4 04/30/2012   CL 99 04/30/2012   CREATININE 0.8 04/30/2012   BUN 27* 04/30/2012   CO2 30 04/30/2012   TSH 0.10* 04/30/2012   INR 1.55* 01/23/2012   HGBA1C 6.0 03/29/2010   Ct Chest W Contrast  05/06/2012  *RADIOLOGY REPORT*  Clinical Data: Follow up abnormal chest radiograph.  CT CHEST WITH CONTRAST  Technique:  Multidetector CT imaging  of the chest was performed following the standard protocol during bolus administration of intravenous contrast.  Contrast: 80mL OMNIPAQUE IOHEXOL 300 MG/ML  SOLN  Comparison: 01/20/2012  Findings: There is no pleural effusion identified.  No airspace consolidation or mass identified.  Small pulmonary nodule in the right upper lobe measures 5 mm, image 14.  Pulmonary nodule in the left lower lobe measures 4 mm, image 40.  Scarring is noted in the right lung apex.  There is mild cylindrical type bronchiectasis noted within the right middle lobe, image 33.  Normal heart size.  No pericardial effusion.  No mediastinal or hilar adenopathy identified. Coronary artery calcifications are noted involving the LAD, left circumflex and RCA coronary artery.  There are no enlarged axillary or supraclavicular lymph nodes. Review of the visualized osseous structures is remarkable for mild multilevel degenerative disc disease.  There is a compression fracture involving the L1 vertebra which has been treated with bone cement.  Limited imaging through the upper abdomen demonstrates no acute findings.  IMPRESSION:  1.  No acute findings. 2.  Small pulmonary nodules have a nonspecific appearance.  The largest measures 5 mm. If the patient is at high risk for bronchogenic carcinoma, follow-up chest CT at 6-12 months is recommended.  If the patient is at low risk for bronchogenic carcinoma, follow-up chest CT at 12 months is recommended.  This recommendation follows the consensus statement: Guidelines for Management of Small Pulmonary Nodules Detected on CT Scans: A Statement from the Fleischner Society as published in Radiology 2005; 237:395-400.   Original Report Authenticated By: Signa Kell, M.D.       Assessment & Plan:  See problem list. Medications and labs reviewed today.

## 2012-05-17 NOTE — Addendum Note (Signed)
Addended by: Rene Paci A on: 05/17/2012 02:42 PM   Modules accepted: Orders

## 2012-05-17 NOTE — Telephone Encounter (Signed)
Future labs orederd for week of 10/11/12 - please let pt know same (to be done prior to follow up CT chest as scheduled for 10/2012) - thanks

## 2012-05-17 NOTE — Patient Instructions (Addendum)
It was good to see you today. We have reviewed your prior records including labs and tests today Will recheck CTchest scan in 6 months as discussed - call sooner if problems Please schedule followup in 6 months for review and blood pressure check, call sooner if problems.

## 2012-05-17 NOTE — Telephone Encounter (Signed)
Per Rose ([facility] CT Dept) pt will need labs done around July 2014.  Thanks

## 2012-05-17 NOTE — Assessment & Plan Note (Signed)
Reviewed 05/2012 pulm CT report with pt/son in depth today Recheck CT 10/2012, pt to call sooner if problems

## 2012-05-18 NOTE — Telephone Encounter (Signed)
Called pt spoke with husband gave md response.../lmb 

## 2012-05-20 ENCOUNTER — Encounter (INDEPENDENT_AMBULATORY_CARE_PROVIDER_SITE_OTHER): Payer: Medicare Other

## 2012-05-20 DIAGNOSIS — I739 Peripheral vascular disease, unspecified: Secondary | ICD-10-CM | POA: Diagnosis not present

## 2012-05-24 ENCOUNTER — Encounter: Payer: Self-pay | Admitting: Internal Medicine

## 2012-06-11 ENCOUNTER — Ambulatory Visit (INDEPENDENT_AMBULATORY_CARE_PROVIDER_SITE_OTHER): Payer: Medicare Other | Admitting: Cardiovascular Disease

## 2012-06-11 ENCOUNTER — Encounter: Payer: Self-pay | Admitting: Cardiovascular Disease

## 2012-06-11 VITALS — BP 118/56 | HR 65 | Ht 67.0 in | Wt 167.0 lb

## 2012-06-11 DIAGNOSIS — I70219 Atherosclerosis of native arteries of extremities with intermittent claudication, unspecified extremity: Secondary | ICD-10-CM

## 2012-06-11 NOTE — Progress Notes (Signed)
HPI:  79 year old woman presenting for followup evaluation. She has lower extremity peripheral arterial disease, palpitations, and white coat hypertension. She's had a feeling of "tiredness" in her chest with activity. This is been a complaint now for several years. 8 stress perfusion study was done in 2012 and this was negative for ischemia. Her left ventricular ejection fraction is normal at 57%.  She still having a lot of symptoms in her legs. She primarily has symptoms at rest and at night time. She complains of a pain in the ankles and feet. The pain is worse on the left than the right. She doesn't have much trouble with walking, and in fact her legs feel better after she has been more active.  She had a recent ABI study done and this was normal on the right with an ABI of 1.2 and mildly reduced on the left with an ABI of 0.73. A CT angiogram in the past has demonstrated SFA disease on the left.  Outpatient Encounter Prescriptions as of 06/11/2012  Medication Sig Dispense Refill  . amLODipine-benazepril (LOTREL) 5-20 MG per capsule TAKE ONE CAPSULE BY MOUTH DAILY.  30 capsule  5  . aspirin EC 81 MG tablet Take 81 mg by mouth daily.      . calcium carbonate (OS-CAL) 600 MG TABS Take 600 mg by mouth 2 (two) times daily with a meal.      . Carboxymethylcellulose Sodium (LUBRICANT EYE DROPS OP) Apply 2 drops to eye daily as needed. For dry eyes      . Cholecalciferol 2000 UNITS TABS Take 2,000 Units by mouth daily.      . naproxen (NAPROSYN) 500 MG tablet Take 500 mg by mouth as needed.      Marland Kitchen SYNTHROID 150 MCG tablet Take 1 tablet by mouth daily.      Marland Kitchen zolpidem (AMBIEN) 10 MG tablet Take 1 tablet (10 mg total) by mouth at bedtime as needed for sleep.  30 tablet  1  . [DISCONTINUED] naproxen (NAPROSYN) 500 MG tablet Take 1 tablet (500 mg total) by mouth 2 (two) times daily with a meal.  40 tablet  1  . [DISCONTINUED] SYNTHROID 137 MCG tablet Take 1 tablet by mouth daily.      . [DISCONTINUED]  levothyroxine (SYNTHROID, LEVOTHROID) 125 MCG tablet Take 125 mcg by mouth daily.      . [DISCONTINUED] Multiple Vitamin (MULTIVITAMIN WITH MINERALS) TABS Take 1 tablet by mouth daily.      . [DISCONTINUED] traMADol (ULTRAM) 50 MG tablet Take 1 tablet (50 mg total) by mouth every 6 (six) hours as needed for pain. Maximum dose= 8 tablets per day  45 tablet  0   No facility-administered encounter medications on file as of 06/11/2012.    Allergies  Allergen Reactions  . Statins     Past Medical History  Diagnosis Date  . LOW BACK PAIN, CHRONIC   . Diverticulosis   . ANEMIA   . ANXIETY   . Arthritis   . HYPERTENSION   . HYPERLIPIDEMIA   . HYPOTHYROIDISM   . PAD (peripheral artery disease)     ROS: Negative except as per HPI  BP 118/56  Pulse 65  Ht 5\' 7"  (1.702 m)  Wt 75.751 kg (167 lb)  BMI 26.15 kg/m2  SpO2 97%  PHYSICAL EXAM: Pt is alert and oriented, pleasant elderly woman in NAD HEENT: normal Neck: JVP - normal, carotids 2+= without bruits Lungs: CTA bilaterally CV: RRR with soft systolic ejection murmur at  the left sternal border Abd: soft, NT, Positive BS, no hepatomegaly Ext: no C/C/E, left DP and PT pulses are trace Skin: warm/dry no rash, there are spider telangiectasias on the ankles and and feet.  CTA Aortoiliac with bilateral runoff: IMPRESSION:  No evidence of significant aortic or iliac arterial stenosis.  In the right lower extremity, moderate atherosclerotic changes of  the distal superficial femoral artery without significant focal  stenosis and two-vessel runoff to the right ankle via the peroneal  and posterior tibial arteries.  There is some narrowing at the origin of the left superficial  femoral artery. There is also felt to be a significant stenosis of  the distal superficial femoral artery and the abductor canal. It  is densely calcified which may exaggerate the narrowing. Three-  vessel runoff to the left ankle.  Varicose veins along the  medial left thigh and leg are present.  The patient may have venous insufficiency of the left lower  extremity. Clinical and sonographic evaluation may be helpful  ASSESSMENT AND PLAN: 1. Lower extremity peripheral arterial disease. I think this problem is stable. Her ABIs are reassuring in that her right ABI is normal and the left is mildly reduced. She does not have symptoms of claudication. She clearly has problems with leg pain, but this is atypical for vascular disease. I suspect is related to lumbar spine disease. She will followup in one year.  2. Cardiac palpitations. Currently asymptomatic. She will continue on her current medical program. I will see her back in one year.  3. Hypertension. Blood pressure is controlled on her current medical program with amlodipine/benazepril   Tonny Bollman 06/11/2012 3:08 PM

## 2012-06-11 NOTE — Patient Instructions (Addendum)
Your physician wants you to follow-up in: 1 YEAR with Dr Cooper.  You will receive a reminder letter in the mail two months in advance. If you don't receive a letter, please call our office to schedule the follow-up appointment.  Your physician recommends that you continue on your current medications as directed. Please refer to the Current Medication list given to you today.  

## 2012-06-25 ENCOUNTER — Telehealth: Payer: Self-pay | Admitting: Internal Medicine

## 2012-06-25 MED ORDER — LEVOTHYROXINE SODIUM 150 MCG PO TABS: 150.0000 ug | ORAL_TABLET | Freq: Every day | ORAL | Status: DC

## 2012-06-25 NOTE — Telephone Encounter (Signed)
Pt req Synthroid 150mg  to be call into [facility]. Last ov was 05/17/12. Please advise.

## 2012-06-25 NOTE — Telephone Encounter (Signed)
Rx sent to [facility]. Pt informed.

## 2012-07-19 ENCOUNTER — Other Ambulatory Visit: Payer: Self-pay | Admitting: *Deleted

## 2012-07-19 MED ORDER — LEVOTHYROXINE SODIUM 150 MCG PO TABS: 150.0000 ug | ORAL_TABLET | Freq: Every day | ORAL | Status: DC

## 2012-08-05 DIAGNOSIS — H04129 Dry eye syndrome of unspecified lacrimal gland: Secondary | ICD-10-CM | POA: Diagnosis not present

## 2012-08-09 ENCOUNTER — Other Ambulatory Visit: Payer: Self-pay | Admitting: *Deleted

## 2012-08-09 DIAGNOSIS — R262 Difficulty in walking, not elsewhere classified: Secondary | ICD-10-CM | POA: Diagnosis not present

## 2012-08-09 DIAGNOSIS — M5030 Other cervical disc degeneration, unspecified cervical region: Secondary | ICD-10-CM | POA: Diagnosis not present

## 2012-08-09 MED ORDER — ZOLPIDEM TARTRATE 10 MG PO TABS: 10.0000 mg | ORAL_TABLET | Freq: Every evening | ORAL | Status: DC | PRN

## 2012-08-09 NOTE — Telephone Encounter (Signed)
Faxed script back to [facility].../lmb 

## 2012-08-30 ENCOUNTER — Ambulatory Visit: Payer: Medicare Other | Admitting: Internal Medicine

## 2012-08-31 ENCOUNTER — Other Ambulatory Visit: Payer: Self-pay | Admitting: Internal Medicine

## 2012-08-31 DIAGNOSIS — M6281 Muscle weakness (generalized): Secondary | ICD-10-CM | POA: Diagnosis not present

## 2012-08-31 DIAGNOSIS — M545 Low back pain, unspecified: Secondary | ICD-10-CM | POA: Diagnosis not present

## 2012-08-31 DIAGNOSIS — R269 Unspecified abnormalities of gait and mobility: Secondary | ICD-10-CM | POA: Diagnosis not present

## 2012-08-31 DIAGNOSIS — M542 Cervicalgia: Secondary | ICD-10-CM | POA: Diagnosis not present

## 2012-09-06 DIAGNOSIS — M5137 Other intervertebral disc degeneration, lumbosacral region: Secondary | ICD-10-CM | POA: Diagnosis not present

## 2012-09-06 DIAGNOSIS — M5030 Other cervical disc degeneration, unspecified cervical region: Secondary | ICD-10-CM | POA: Diagnosis not present

## 2012-09-06 DIAGNOSIS — M25579 Pain in unspecified ankle and joints of unspecified foot: Secondary | ICD-10-CM | POA: Diagnosis not present

## 2012-09-07 DIAGNOSIS — M545 Low back pain, unspecified: Secondary | ICD-10-CM | POA: Diagnosis not present

## 2012-09-07 DIAGNOSIS — M6281 Muscle weakness (generalized): Secondary | ICD-10-CM | POA: Diagnosis not present

## 2012-09-07 DIAGNOSIS — M542 Cervicalgia: Secondary | ICD-10-CM | POA: Diagnosis not present

## 2012-09-07 DIAGNOSIS — R269 Unspecified abnormalities of gait and mobility: Secondary | ICD-10-CM | POA: Diagnosis not present

## 2012-09-09 DIAGNOSIS — M542 Cervicalgia: Secondary | ICD-10-CM | POA: Diagnosis not present

## 2012-09-09 DIAGNOSIS — M6281 Muscle weakness (generalized): Secondary | ICD-10-CM | POA: Diagnosis not present

## 2012-09-09 DIAGNOSIS — M545 Low back pain, unspecified: Secondary | ICD-10-CM | POA: Diagnosis not present

## 2012-09-09 DIAGNOSIS — R269 Unspecified abnormalities of gait and mobility: Secondary | ICD-10-CM | POA: Diagnosis not present

## 2012-09-16 DIAGNOSIS — M6281 Muscle weakness (generalized): Secondary | ICD-10-CM | POA: Diagnosis not present

## 2012-09-16 DIAGNOSIS — R269 Unspecified abnormalities of gait and mobility: Secondary | ICD-10-CM | POA: Diagnosis not present

## 2012-09-16 DIAGNOSIS — M542 Cervicalgia: Secondary | ICD-10-CM | POA: Diagnosis not present

## 2012-09-16 DIAGNOSIS — M545 Low back pain, unspecified: Secondary | ICD-10-CM | POA: Diagnosis not present

## 2012-10-13 ENCOUNTER — Telehealth: Payer: Self-pay | Admitting: *Deleted

## 2012-10-13 DIAGNOSIS — M549 Dorsalgia, unspecified: Secondary | ICD-10-CM

## 2012-10-13 DIAGNOSIS — M4180 Other forms of scoliosis, site unspecified: Secondary | ICD-10-CM | POA: Diagnosis not present

## 2012-10-13 DIAGNOSIS — M5030 Other cervical disc degeneration, unspecified cervical region: Secondary | ICD-10-CM | POA: Diagnosis not present

## 2012-10-13 DIAGNOSIS — Z981 Arthrodesis status: Secondary | ICD-10-CM | POA: Diagnosis not present

## 2012-10-13 DIAGNOSIS — M4120 Other idiopathic scoliosis, site unspecified: Secondary | ICD-10-CM | POA: Diagnosis not present

## 2012-10-13 NOTE — Telephone Encounter (Signed)
Pt called requesting a referral to an Orthopedic that specializes in back pain.  Her current Ortho is [facility] but they do not have a back specialist.  Please advise

## 2012-10-13 NOTE — Telephone Encounter (Signed)
Left message with family member for pt to return call to clinic

## 2012-10-13 NOTE — Telephone Encounter (Signed)
I will refer to spine and scoliosis specialists -[facility] will call regarding same

## 2012-10-20 DIAGNOSIS — M47817 Spondylosis without myelopathy or radiculopathy, lumbosacral region: Secondary | ICD-10-CM | POA: Diagnosis not present

## 2012-10-20 DIAGNOSIS — M48061 Spinal stenosis, lumbar region without neurogenic claudication: Secondary | ICD-10-CM | POA: Diagnosis not present

## 2012-10-20 DIAGNOSIS — M4120 Other idiopathic scoliosis, site unspecified: Secondary | ICD-10-CM | POA: Diagnosis not present

## 2012-10-27 DIAGNOSIS — E039 Hypothyroidism, unspecified: Secondary | ICD-10-CM | POA: Diagnosis not present

## 2012-10-27 DIAGNOSIS — M461 Sacroiliitis, not elsewhere classified: Secondary | ICD-10-CM | POA: Diagnosis not present

## 2012-11-02 ENCOUNTER — Telehealth: Payer: Self-pay | Admitting: *Deleted

## 2012-11-02 MED ORDER — METHOCARBAMOL 500 MG PO TABS: 500.0000 mg | ORAL_TABLET | Freq: Three times a day (TID) | ORAL | Status: DC

## 2012-11-02 NOTE — Telephone Encounter (Signed)
Received fax pt is wanting refill on her Methocarbamol 500 mg. Not on pt med list. Per fax was originally rx by Dr. Ella Bodo. Last filled 03/29/12...lmb

## 2012-11-02 NOTE — Telephone Encounter (Signed)
Ok - added to list and erx done

## 2012-11-08 ENCOUNTER — Other Ambulatory Visit: Payer: Medicare Other

## 2012-11-08 ENCOUNTER — Other Ambulatory Visit (INDEPENDENT_AMBULATORY_CARE_PROVIDER_SITE_OTHER): Payer: Medicare Other

## 2012-11-08 DIAGNOSIS — I10 Essential (primary) hypertension: Secondary | ICD-10-CM

## 2012-11-08 DIAGNOSIS — R9389 Abnormal findings on diagnostic imaging of other specified body structures: Secondary | ICD-10-CM

## 2012-11-08 LAB — BASIC METABOLIC PANEL
BUN: 15 mg/dL (ref 6–23)
CO2: 27 mEq/L (ref 19–32)
Calcium: 9.2 mg/dL (ref 8.4–10.5)
Chloride: 104 mEq/L (ref 96–112)
Creatinine, Ser: 0.9 mg/dL (ref 0.4–1.2)
GFR: 63.9 mL/min (ref >60.00)
Glucose, Bld: 155 mg/dL — ABNORMAL HIGH (ref 70–99)
Potassium: 3.7 mEq/L (ref 3.5–5.1)
Sodium: 138 mEq/L (ref 135–145)

## 2012-11-09 ENCOUNTER — Other Ambulatory Visit: Payer: Self-pay | Admitting: Internal Medicine

## 2012-11-09 NOTE — Telephone Encounter (Signed)
Faxed script back to [facility].../lmb 

## 2012-11-11 ENCOUNTER — Ambulatory Visit (INDEPENDENT_AMBULATORY_CARE_PROVIDER_SITE_OTHER)
Admission: RE | Admit: 2012-11-11 | Discharge: 2012-11-11 | Disposition: A | Discharge status: Home / Self Care | Payer: Medicare Other | Source: Ambulatory Visit | Attending: Internal Medicine | Admitting: Internal Medicine

## 2012-11-11 DIAGNOSIS — R9389 Abnormal findings on diagnostic imaging of other specified body structures: Secondary | ICD-10-CM

## 2012-11-11 DIAGNOSIS — J984 Other disorders of lung: Secondary | ICD-10-CM | POA: Diagnosis not present

## 2012-11-11 MED ORDER — IOHEXOL 300 MG/ML  SOLN
80.0000 mL | Freq: Once | INTRAMUSCULAR | Status: AC | PRN
  Administered 2012-11-11: 80 mL via INTRAVENOUS

## 2012-11-12 ENCOUNTER — Encounter: Payer: Self-pay | Admitting: Internal Medicine

## 2012-11-15 ENCOUNTER — Encounter: Payer: Self-pay | Admitting: Internal Medicine

## 2012-11-15 ENCOUNTER — Ambulatory Visit (INDEPENDENT_AMBULATORY_CARE_PROVIDER_SITE_OTHER): Payer: Medicare Other | Admitting: Internal Medicine

## 2012-11-15 VITALS — BP 120/60 | HR 71 | Temp 97.3°F | Wt 165.0 lb

## 2012-11-15 DIAGNOSIS — E785 Hyperlipidemia, unspecified: Secondary | ICD-10-CM | POA: Diagnosis not present

## 2012-11-15 DIAGNOSIS — E039 Hypothyroidism, unspecified: Secondary | ICD-10-CM

## 2012-11-15 DIAGNOSIS — M545 Low back pain, unspecified: Secondary | ICD-10-CM | POA: Diagnosis not present

## 2012-11-15 DIAGNOSIS — I10 Essential (primary) hypertension: Secondary | ICD-10-CM | POA: Diagnosis not present

## 2012-11-15 DIAGNOSIS — R9389 Abnormal findings on diagnostic imaging of other specified body structures: Secondary | ICD-10-CM

## 2012-11-15 NOTE — Progress Notes (Signed)
  Subjective:    Patient ID: Brittney Gates, female    DOB: 09-Apr-1924, 79 y.o.   MRN: [id number]  HPI  Here for follow up - reviewed chronic medical issues:   HTN - reports compliance with ongoing medical treatment and no changes in medication dose or frequency. denies adverse side effects related to current therapy.   dyslipidemia - previously rx'd zetia but stopped due to $, declines statin -  hypothyroid - reports compliance with ongoing medical treatment and no changes in medication dose or frequency. denies adverse side effects related to current therapy.   Anxiety, chronic - manifest with insomnia and rumination thoughs at bedtime - use soma or Ambien as needed to control same    Past Medical History  Diagnosis Date  . LOW BACK PAIN, CHRONIC   . Diverticulosis   . ANEMIA   . ANXIETY   . Arthritis   . HYPERTENSION   . HYPERLIPIDEMIA   . HYPOTHYROIDISM   . PAD (peripheral artery disease)      Review of Systems  Constitutional: Positive for fatigue. Negative for fever.  Respiratory: Negative for cough and shortness of breath.   Cardiovascular: Negative for chest pain and palpitations.  Musculoskeletal: Positive for back pain. Negative for joint swelling.  Psychiatric/Behavioral: The patient is nervous/anxious.        Objective:   Physical Exam  BP 120/60  Pulse 71  Temp(Src) 97.3 F (36.3 C) (Oral)  Wt 165 lb (74.844 kg)  BMI 25.84 kg/m2  SpO2 96% Wt Readings from Last 3 Encounters:  11/15/12 165 lb (74.844 kg)  06/11/12 167 lb (75.751 kg)  04/30/12 163 lb 1.3 oz (73.973 kg)   Constitutional: She is large framed older woman;  appears well-developed and well-nourished. No distress. Son [name] is at side Cardiovascular: Normal rate, regular rhythm and normal heart sounds.  No murmur heard. No BLE edema. Pulmonary/Chest: Effort normal and breath sounds normal. No respiratory distress. She has no wheezes.  Neurologic: AAOx4, CN 2-12 symmetrically intact - speech  fluent - no dysarthria - follows commands, good recall- surprisingly normal gait/balance with use of cane Psychiatric: She has a slightly anxious mood and affect. Her behavior is normal. Judgment and thought content normal.   Lab Results  Component Value Date   WBC 8.7 04/30/2012   HGB 11.0* 04/30/2012   HCT 33.3* 04/30/2012   PLT 267.0 04/30/2012   CHOL 184 11/04/2011   TRIG 96.0 11/04/2011   HDL 51.30 11/04/2011   LDLDIRECT 153.3 09/09/2010   ALT 19 03/29/2010   AST 20 03/29/2010   NA 138 11/08/2012   K 3.7 11/08/2012   CL 104 11/08/2012   CREATININE 0.9 11/08/2012   BUN 15 11/08/2012   CO2 27 11/08/2012   TSH 0.10* 04/30/2012   INR 1.55* 01/23/2012   HGBA1C 6.0 03/29/2010        Assessment & Plan:  See problem list. Medications and labs reviewed today.

## 2012-11-15 NOTE — Assessment & Plan Note (Signed)
Check TSH and adjust as needed Pt/son understand and agree to same  Lab Results  Component Value Date   TSH 0.10* 04/30/2012

## 2012-11-15 NOTE — Patient Instructions (Addendum)
It was good to see you today. We have reviewed your prior records including labs and tests today Medications reviewed and updated, no changes recommended at this time. Test(s) ordered today - return this week when you are fasting. Your results will be called to you after review (48-72hours after test completion). If any changes need to be made, you will be notified at that time. Please schedule followup in 6 months, call sooner if problems.

## 2012-11-15 NOTE — Assessment & Plan Note (Signed)
Multiple prior interventions including fusion L4-5 "bad back" with advanced DDD and multilevel spondylosis (last MRI in our system 11/2007) now follow up at [facility] with Dr Wyline Mood since

## 2012-11-15 NOTE — Assessment & Plan Note (Signed)
Prev on zetia, no statin The current medical regimen is effective;  continue present plan and medications. Check flp - adjust as needed

## 2012-11-15 NOTE — Assessment & Plan Note (Signed)
BP Readings from Last 3 Encounters:  11/15/12 120/60  06/11/12 118/56  05/17/12 110/68   The current medical regimen is effective;  continue present plan and medications.

## 2012-11-15 NOTE — Assessment & Plan Note (Signed)
Reviewed 05/2012 pulm CT report Recheck CT 10/2012 with no change in size follow up 12 mo Reviewed same with pt/son in depth today

## 2012-12-01 ENCOUNTER — Telehealth: Payer: Self-pay | Admitting: Cardiovascular Disease

## 2012-12-01 NOTE — Telephone Encounter (Signed)
I spoke with the pt and for the past 4 days she has had an off and on sharp pain that turns into a dull ache under her left breast.  When the pt has these symptoms they last about 20-30 seconds and then resolve. The pt does have tenderness when she presses under her left breast. I made the pt aware that this is most likely muscle related. The pt would like reassurance so I scheduled her to see Dr Excell Seltzer on 12/03/12.  If the pt feels like her symptoms improve then she will call the office and cancel appointment.

## 2012-12-01 NOTE — Telephone Encounter (Signed)
New problem    C/o pain under the left sided breast.  Comes and goes

## 2012-12-03 ENCOUNTER — Ambulatory Visit: Payer: Medicare Other | Admitting: Cardiovascular Disease

## 2012-12-08 ENCOUNTER — Other Ambulatory Visit (INDEPENDENT_AMBULATORY_CARE_PROVIDER_SITE_OTHER): Payer: Medicare Other

## 2012-12-08 DIAGNOSIS — E039 Hypothyroidism, unspecified: Secondary | ICD-10-CM | POA: Diagnosis not present

## 2012-12-08 DIAGNOSIS — E785 Hyperlipidemia, unspecified: Secondary | ICD-10-CM

## 2012-12-08 LAB — LIPID PANEL
Cholesterol: 210 mg/dL — ABNORMAL HIGH (ref 0–200)
HDL: 46.5 mg/dL (ref >39.00)
Total CHOL/HDL Ratio: 5
Triglycerides: 149 mg/dL (ref 0.0–149.0)
VLDL: 29.8 mg/dL (ref 0.0–40.0)

## 2012-12-08 LAB — LDL CHOLESTEROL, DIRECT: Direct LDL: 143.1 mg/dL

## 2012-12-08 LAB — TSH: TSH: 0.02 u[IU]/mL — ABNORMAL LOW (ref 0.35–5.50)

## 2012-12-09 ENCOUNTER — Telehealth: Payer: Self-pay | Admitting: *Deleted

## 2012-12-09 MED ORDER — LEVOTHYROXINE SODIUM 125 MCG PO TABS: 125.0000 ug | ORAL_TABLET | Freq: Every day | ORAL | Status: DC

## 2012-12-09 NOTE — Telephone Encounter (Signed)
Called pt concerning labs results. Needing new dose synthroid sent to pharmacy...lmb

## 2012-12-13 ENCOUNTER — Encounter: Payer: Self-pay | Admitting: *Deleted

## 2012-12-13 ENCOUNTER — Encounter: Payer: Self-pay | Admitting: Internal Medicine

## 2012-12-13 ENCOUNTER — Telehealth: Payer: Self-pay | Admitting: Internal Medicine

## 2012-12-13 DIAGNOSIS — Z23 Encounter for immunization: Secondary | ICD-10-CM | POA: Diagnosis not present

## 2012-12-13 NOTE — Telephone Encounter (Signed)
Please add Mag Oxide 500mg , 2 tabs in am , continue Miralax

## 2012-12-13 NOTE — Telephone Encounter (Signed)
Patient notified of Dr. Regino Schultze recommendations. She will try the Magnesium Oxide.

## 2012-12-13 NOTE — Telephone Encounter (Signed)
Patient states she is having more problems with constipation. She takes Miralax nightly and it had been working but recently it stopped working. She state she is not having regular bowel movements and is having lower abdominal pain.  She reports small pencil like stools. States her body "has a spasm when I try to have a bowel movement." Reports a good appetite. She is having more gas. Denies bleeding or fever. She will increase her MIralax to BID. Offered OV with extender but she prefers to see Dr. Juanda Chance. Schedule OV on 12/24/12 at 2:00 PM. Patient understands to call me back if she develops increasing pain or fever.

## 2012-12-24 ENCOUNTER — Ambulatory Visit: Payer: Medicare Other | Admitting: Internal Medicine

## 2013-01-07 ENCOUNTER — Other Ambulatory Visit: Payer: Self-pay | Admitting: *Deleted

## 2013-01-07 MED ORDER — ZOLPIDEM TARTRATE 10 MG PO TABS: ORAL_TABLET | ORAL | Status: DC

## 2013-01-07 NOTE — Telephone Encounter (Signed)
Faxed script back to [facility].../lmb 

## 2013-01-14 ENCOUNTER — Telehealth: Payer: Self-pay | Admitting: Cardiovascular Disease

## 2013-01-14 NOTE — Telephone Encounter (Signed)
I spoke with the pt and she complains of constant burning and tingling down both legs.  This has been an ongoing issue for a long time. The pt has also noticed that she does not have hair on her legs.  I made the pt aware that she just had a lower extremity doppler performed earlier this year and we know she has PAD.  The pt also wonders if this is related to her multiple spine surgeries.  I made her aware that this is possible and if it is very bothersome then she can seek further evaluation with her PCP. Pt agreed with plan.

## 2013-01-14 NOTE — Telephone Encounter (Signed)
New message    C/o legs burning radiating down to feet.  She was wondering if she needs another doppler.  Hair is not growing on her legs where the burning sensation is and she thinks she needs a doppler

## 2013-01-28 ENCOUNTER — Encounter: Payer: Self-pay | Admitting: Internal Medicine

## 2013-01-28 ENCOUNTER — Ambulatory Visit (INDEPENDENT_AMBULATORY_CARE_PROVIDER_SITE_OTHER): Payer: Medicare Other | Admitting: Internal Medicine

## 2013-01-28 ENCOUNTER — Ambulatory Visit: Payer: Medicare Other | Admitting: Internal Medicine

## 2013-01-28 VITALS — BP 140/60 | HR 64 | Ht 67.0 in | Wt 167.2 lb

## 2013-01-28 DIAGNOSIS — K5900 Constipation, unspecified: Secondary | ICD-10-CM

## 2013-01-28 NOTE — Patient Instructions (Signed)
Please continue Miralax 17 grams (1 capful) dissolved in at least 8 ounces water/juice once daily. Continue Magnesium Oxide 400 mg 2 tablets daily  Please purchase the following medications over the counter and take as directed: Metamucil 1 heaping teaspoon daily Glycerin Suppositories-Insert 1 per rectum as needed for rectal fullness  Cc: Dr Felicity Coyer

## 2013-01-28 NOTE — Progress Notes (Signed)
Brittney Gates 07-17-1924 MRN [id number]  History of Present Illness:  This is an 79 year old white female with chronic constipation which started after  hip fracture one year ago at which time she was immobilized for several weeks  She became very constipated during the process of rehabiliation to the point where she had to disimpact herself manually. Since then, she has been on MiraLax 17 g once or twice a day but her stools have been very ropey and narrow and sometimes liquidy. She has a discomfort of the pelvic floor as if she needed to evacuate but cannot do it. She had a colonoscopy in 2008 with findings of severe diverticulosis of the left colon with luminal narrowing. She had an upper endoscopy in March 2010 with findings of gastritis which was reactive gastropathy from naproxen. A HIDA scan was negative. She has lost some weight since last year, mainly due to decreased appetite but she is overall feeling well and ambulates with a cane. She is trying to eat high-fiber foods.   Past Medical History  Diagnosis Date  . LOW BACK PAIN, CHRONIC   . Diverticulosis   . ANEMIA   . ANXIETY   . Arthritis   . HYPERTENSION   . HYPERLIPIDEMIA   . HYPOTHYROIDISM   . PAD (peripheral artery disease)   . Hiatal hernia   . Gastritis   . Hemorrhoids    Past Surgical History  Procedure Laterality Date  . Back surgery      x's 2  . Hemorrhoid surgery    . Angioplasty  1995  . Tubal ligation    . Breast surgery      Benign  . Appendectomy    . Femur im nail  01/21/2012    Procedure: INTRAMEDULLARY (IM) NAIL FEMORAL;  Surgeon: Brittney Mustard, MD;  Location: [facility];  Service: Orthopedics;  Laterality: Left;  Intertrochanteric Nail Left Hip    reports that she has never smoked. She does not have any smokeless tobacco history on file. She reports that she does not drink alcohol or use illicit drugs. family history includes Gallbladder disease in her daughter and son; Lung cancer in her father. There is  no history of Colon cancer. Allergies  Allergen Reactions  . Statins         Review of Systems: Rectal fullness and discomfort. Denies rectal bleeding. Weight loss  The remainder of the 10 point ROS is negative except as outlined in H&P   Physical Exam: General appearance  Well developed, in no distress. Eyes- non icteric. HEENT nontraumatic, normocephalic. Mouth no lesions, tongue papillated, no cheilosis. Neck supple without adenopathy, thyroid not enlarged, no carotid bruits, no JVD. Lungs Clear to auscultation bilaterally. Cor normal S1, normal S2, regular rhythm, no murmur,  quiet precordium. Abdomen: Soft relaxed, nontender. Normal active bowel sounds. No fullness. Liver edge at costal margin. Rectal: Normal perianal area. Normal rectal sphincter tone. There is a small amount of soft Hemoccult negative stool. Extremities no pedal edema. Skin no lesions. Neurological alert and oriented x 3. Psychological normal mood and affect.  Assessment and Plan:  Problem #66 79 year old white female with progressive constipation which dates back to her hip surgery. It is most likely caused by slow transit constipation due to decreased food intake, decreased mobility as well as due to pelvic floor weakness. She will continue MiraLax 17 g daily. Since she has severe diverticulosis and narrowing of the lumen,  She may not  Tolerate high  fiber but I have  asked her to take one heaping teaspoon of Metamucil daily which will bulk up the stool. She will be using glycerin suppositories when necessary and will also add magnesium oxide 2 tablets daily. She is up-to-date on her colonoscopy. We may consider a CT scan of the abdomen if the symptoms continue.   01/28/2013 Brittney Gates

## 2013-01-29 ENCOUNTER — Encounter: Payer: Self-pay | Admitting: Internal Medicine

## 2013-03-09 ENCOUNTER — Other Ambulatory Visit: Payer: Self-pay | Admitting: *Deleted

## 2013-03-09 MED ORDER — ZOLPIDEM TARTRATE 10 MG PO TABS: ORAL_TABLET | ORAL | Status: DC

## 2013-03-09 NOTE — Telephone Encounter (Signed)
Faxed script back to [facility],,,/lmb 

## 2013-03-29 ENCOUNTER — Encounter: Payer: Self-pay | Admitting: Internal Medicine

## 2013-03-29 ENCOUNTER — Ambulatory Visit (INDEPENDENT_AMBULATORY_CARE_PROVIDER_SITE_OTHER): Payer: Medicare Other | Admitting: Internal Medicine

## 2013-03-29 VITALS — BP 122/64 | HR 88 | Temp 96.0°F | Resp 16 | Wt 159.0 lb

## 2013-03-29 DIAGNOSIS — B001 Herpesviral vesicular dermatitis: Secondary | ICD-10-CM | POA: Insufficient documentation

## 2013-03-29 DIAGNOSIS — B009 Herpesviral infection, unspecified: Secondary | ICD-10-CM

## 2013-03-29 DIAGNOSIS — I10 Essential (primary) hypertension: Secondary | ICD-10-CM

## 2013-03-29 DIAGNOSIS — J069 Acute upper respiratory infection, unspecified: Secondary | ICD-10-CM

## 2013-03-29 MED ORDER — AMOXICILLIN 875 MG PO TABS: 875.0000 mg | ORAL_TABLET | Freq: Two times a day (BID) | ORAL | Status: DC

## 2013-03-29 MED ORDER — PROMETHAZINE-CODEINE 6.25-10 MG/5ML PO SYRP: 5.0000 mL | ORAL_SOLUTION | ORAL | Status: DC | PRN

## 2013-03-29 MED ORDER — ACYCLOVIR 400 MG PO TABS: 400.0000 mg | ORAL_TABLET | Freq: Three times a day (TID) | ORAL | Status: DC

## 2013-03-29 NOTE — Assessment & Plan Note (Signed)
12/14 L nose - early Acyclovir po

## 2013-03-29 NOTE — Progress Notes (Signed)
Pre visit review using our clinic review tool, if applicable. No additional management support is needed unless otherwise documented below in the visit note. 

## 2013-03-29 NOTE — Assessment & Plan Note (Signed)
12/14 bronchitis Amoxicillin Prom - Cod syr

## 2013-03-29 NOTE — Progress Notes (Signed)
   Subjective:    URI  This is a new problem. The current episode started in the past 7 days. The problem has been gradually worsening. The maximum temperature recorded prior to her arrival was 100 - 100.9 F. Associated symptoms include congestion, coughing, rhinorrhea, sinus pain and a sore throat. Pertinent negatives include no abdominal pain, chest pain, rash or wheezing. She has tried acetaminophen for the symptoms. The treatment provided no relief.      Review of Systems  Constitutional: Positive for chills.  HENT: Positive for congestion, rhinorrhea, sinus pressure and sore throat.   Respiratory: Positive for cough. Negative for wheezing.   Cardiovascular: Negative for chest pain.  Gastrointestinal: Negative for abdominal pain.  Skin: Negative for rash.       Objective:   Physical Exam  Constitutional: She appears well-developed.  Looks tired  HENT:  Head: Normocephalic.  Right Ear: External ear normal.  Left Ear: External ear normal.  Nose: Nose normal.  Mouth/Throat: Oropharynx is clear and moist.  eryth throat  Eyes: Conjunctivae are normal. Pupils are equal, round, and reactive to light. Right eye exhibits no discharge. Left eye exhibits no discharge.  Neck: Normal range of motion. Neck supple. No JVD present. No tracheal deviation present. No thyromegaly present.  Cardiovascular: Normal rate, regular rhythm and normal heart sounds.   Pulmonary/Chest: No stridor. No respiratory distress. She has no wheezes.  Abdominal: Soft. Bowel sounds are normal. She exhibits no distension and no mass. There is no tenderness. There is no rebound and no guarding.  Musculoskeletal: She exhibits no edema and no tenderness.  Lymphadenopathy:    She has no cervical adenopathy.  Neurological: She displays normal reflexes. No cranial nerve deficit. She exhibits normal muscle tone. Coordination normal.  Skin: No rash noted. No erythema.  Psychiatric: She has a normal mood and affect. Her  behavior is normal. Judgment and thought content normal.          Assessment & Plan:

## 2013-03-29 NOTE — Patient Instructions (Signed)
Use over-the-counter  "cold" medicines  such as  "Afrin" nasal spray for nasal congestion as directed instead. Use" Delsym" or" Robitussin" cough syrup varietis for cough.  You can use plain "Tylenol" or "Advil" for fever, chills and achyness.  Please, make an appointment if you are not better or if you're worse.  

## 2013-03-31 NOTE — Assessment & Plan Note (Signed)
Continue with current prescription therapy as reflected on the Med list.  

## 2013-04-04 ENCOUNTER — Other Ambulatory Visit: Payer: Self-pay | Admitting: *Deleted

## 2013-04-04 MED ORDER — METHOCARBAMOL 500 MG PO TABS: 500.0000 mg | ORAL_TABLET | Freq: Three times a day (TID) | ORAL | Status: DC | PRN

## 2013-04-20 ENCOUNTER — Ambulatory Visit (INDEPENDENT_AMBULATORY_CARE_PROVIDER_SITE_OTHER)
Admission: RE | Admit: 2013-04-20 | Discharge: 2013-04-20 | Disposition: A | Discharge status: Home / Self Care | Payer: Medicare Other | Source: Ambulatory Visit | Attending: Internal Medicine | Admitting: Internal Medicine

## 2013-04-20 ENCOUNTER — Ambulatory Visit (INDEPENDENT_AMBULATORY_CARE_PROVIDER_SITE_OTHER): Payer: Medicare Other | Admitting: Internal Medicine

## 2013-04-20 ENCOUNTER — Encounter: Payer: Self-pay | Admitting: Internal Medicine

## 2013-04-20 VITALS — BP 128/62 | HR 90 | Temp 97.3°F | Ht 69.0 in | Wt 159.4 lb

## 2013-04-20 DIAGNOSIS — F411 Generalized anxiety disorder: Secondary | ICD-10-CM | POA: Diagnosis not present

## 2013-04-20 DIAGNOSIS — R059 Cough, unspecified: Secondary | ICD-10-CM

## 2013-04-20 DIAGNOSIS — J449 Chronic obstructive pulmonary disease, unspecified: Secondary | ICD-10-CM | POA: Diagnosis not present

## 2013-04-20 DIAGNOSIS — I10 Essential (primary) hypertension: Secondary | ICD-10-CM

## 2013-04-20 DIAGNOSIS — R05 Cough: Secondary | ICD-10-CM | POA: Diagnosis not present

## 2013-04-20 MED ORDER — HYDROCODONE-HOMATROPINE 5-1.5 MG/5ML PO SYRP: 5.0000 mL | ORAL_SOLUTION | Freq: Four times a day (QID) | ORAL | Status: DC | PRN

## 2013-04-20 MED ORDER — LEVOFLOXACIN 250 MG PO TABS
250.0000 mg | ORAL_TABLET | Freq: Every day | ORAL | Status: DC
Start: 2013-04-20 — End: 2013-05-15

## 2013-04-20 NOTE — Progress Notes (Signed)
Subjective:    Patient ID: Brittney Gates, female    DOB: 15-Aug-1924, 80 y.o.   MRN: [id number]  HPI  Here with acute onset mild to mod 2-3 days ST, HA, general weakness and malaise, with non prod cough, but Pt denies chest pain, increased sob or doe, wheezing, orthopnea, PND, increased LE swelling, palpitations, dizziness or syncope.  Pt denies polydipsia, polyuria  Pt denies new neurological symptoms such as new facial or extremity weakness or numbness Past Medical History  Diagnosis Date  . LOW BACK PAIN, CHRONIC   . Diverticulosis   . ANEMIA   . ANXIETY   . Arthritis   . HYPERTENSION   . HYPERLIPIDEMIA   . HYPOTHYROIDISM   . PAD (peripheral artery disease)   . Hiatal hernia   . Gastritis   . Hemorrhoids    Past Surgical History  Procedure Laterality Date  . Back surgery      x's 2  . Hemorrhoid surgery    . Angioplasty  1995  . Tubal ligation    . Breast surgery      Benign  . Appendectomy    . Femur im nail  01/21/2012    Procedure: INTRAMEDULLARY (IM) NAIL FEMORAL;  Surgeon: Newt Minion, MD;  Location: [facility];  Service: Orthopedics;  Laterality: Left;  Intertrochanteric Nail Left Hip    reports that she has never smoked. She does not have any smokeless tobacco history on file. She reports that she does not drink alcohol or use illicit drugs. family history includes Gallbladder disease in her daughter and son; Lung cancer in her father. There is no history of Colon cancer. Allergies  Allergen Reactions  . Statins    Current Outpatient Prescriptions on File Prior to Visit  Medication Sig Dispense Refill  . amLODipine-benazepril (LOTREL) 5-20 MG per capsule TAKE ONE CAPSULE BY MOUTH DAILY.  30 capsule  5  . aspirin EC 81 MG tablet Take 81 mg by mouth daily.      . calcium carbonate (OS-CAL) 600 MG TABS Take 600 mg by mouth 2 (two) times daily with a meal.      . Cholecalciferol 2000 UNITS TABS Take 2,000 Units by mouth daily.      Marland Kitchen levothyroxine (SYNTHROID,  LEVOTHROID) 125 MCG tablet Take 1 tablet (125 mcg total) by mouth daily before breakfast.  30 tablet  5  . naproxen (NAPROSYN) 500 MG tablet Take 500 mg by mouth as needed.      . zolpidem (AMBIEN) 10 MG tablet TAKE 1 TABLET BY MOUTH AT BEDTIME AS NEEDED FOR SLEEP  30 tablet  1   No current facility-administered medications on file prior to visit.   Current Outpatient Prescriptions on File Prior to Visit  Medication Sig Dispense Refill  . amLODipine-benazepril (LOTREL) 5-20 MG per capsule TAKE ONE CAPSULE BY MOUTH DAILY.  30 capsule  5  . aspirin EC 81 MG tablet Take 81 mg by mouth daily.      . calcium carbonate (OS-CAL) 600 MG TABS Take 600 mg by mouth 2 (two) times daily with a meal.      . Cholecalciferol 2000 UNITS TABS Take 2,000 Units by mouth daily.      Marland Kitchen levothyroxine (SYNTHROID, LEVOTHROID) 125 MCG tablet Take 1 tablet (125 mcg total) by mouth daily before breakfast.  30 tablet  5  . naproxen (NAPROSYN) 500 MG tablet Take 500 mg by mouth as needed.      . zolpidem (AMBIEN) 10 MG  tablet TAKE 1 TABLET BY MOUTH AT BEDTIME AS NEEDED FOR SLEEP  30 tablet  1   No current facility-administered medications on file prior to visit.   Review of Systems  Constitutional: Negative for unexpected weight change, or unusual diaphoresis  HENT: Negative for tinnitus.   Eyes: Negative for photophobia and visual disturbance.  Respiratory: Negative for choking and stridor.   Gastrointestinal: Negative for vomiting and blood in stool.  Genitourinary: Negative for hematuria and decreased urine volume.  Musculoskeletal: Negative for acute joint swelling Skin: Negative for color change and wound.  Neurological: Negative for tremors and numbness other than noted  Psychiatric/Behavioral: Negative for decreased concentration or  hyperactivity.       Objective:   Physical Exam BP 128/62  Pulse 90  Temp(Src) 97.3 F (36.3 C) (Oral)  Ht 5\' 9"  (1.753 m)  Wt 159 lb 6 oz (72.292 kg)  BMI 23.52 kg/m2   SpO2 94% VS noted, mild ill Constitutional: Pt appears well-developed and well-nourished.  HENT: Head: NCAT.  Right Ear: External ear normal.  Left Ear: External ear normal.  Bilat tm's with mild erythema.  Max sinus areas mild tender.  Pharynx with mild erythema, no exudate Eyes: Conjunctivae and EOM are normal. Pupils are equal, round, and reactive to light.  Neck: Normal range of motion. Neck supple.  Cardiovascular: Normal rate and regular rhythm.   Pulmonary/Chest: Effort normal and breath sounds somewhat decr with few rhonichi.  scatterred Neurological: Pt is alert. Not confused  Skin: Skin is warm. No erythema.  Psychiatric: Pt behavior is normal. Thought content normal. mild nervous, not depressed affect      Assessment & Plan:

## 2013-04-20 NOTE — Patient Instructions (Signed)
Please take all new medication as prescribed - the antibiotic, and cough medicine Please continue all other medications as before, and refills have been done if requested. Please have the pharmacy call with any other refills you may need.  Please go to the XRAY Department in the Basement (go straight as you get off the elevator) for the x-ray testing You will be contacted by phone if any changes need to be made immediately.  Otherwise, you will receive a letter about your results with an explanation, but please check with MyChart first.

## 2013-04-20 NOTE — Progress Notes (Signed)
Pre-visit discussion using our clinic review tool. No additional management support is needed unless otherwise documented below in the visit note.  

## 2013-04-25 NOTE — Assessment & Plan Note (Signed)
stable overall by history and exam, recent data reviewed with pt, and pt to continue medical treatment as before,  to f/u any worsening symptoms or concerns BP Readings from Last 3 Encounters:  04/20/13 128/62  03/29/13 122/64  01/28/13 140/60

## 2013-04-25 NOTE — Assessment & Plan Note (Signed)
Mild to mod, ? Atypical org, cant r/o pna - for cxr, for antibx course,  to f/u any worsening symptoms or concerns

## 2013-04-25 NOTE — Assessment & Plan Note (Signed)
Mild situational, reassurance, cont same tx 

## 2013-05-03 ENCOUNTER — Other Ambulatory Visit: Payer: Self-pay | Admitting: Internal Medicine

## 2013-05-04 ENCOUNTER — Other Ambulatory Visit: Payer: Self-pay | Admitting: *Deleted

## 2013-05-04 MED ORDER — ZOLPIDEM TARTRATE 10 MG PO TABS: ORAL_TABLET | ORAL | Status: DC

## 2013-05-04 NOTE — Telephone Encounter (Signed)
Faxed script back to [facility].../lmb 

## 2013-05-06 ENCOUNTER — Other Ambulatory Visit: Payer: Self-pay | Admitting: Internal Medicine

## 2013-05-15 ENCOUNTER — Encounter (HOSPITAL_COMMUNITY): Payer: Self-pay | Admitting: Emergency Medicine

## 2013-05-15 ENCOUNTER — Inpatient Hospital Stay (HOSPITAL_COMMUNITY): Payer: Medicare Other

## 2013-05-15 ENCOUNTER — Inpatient Hospital Stay (HOSPITAL_COMMUNITY)
Admission: EM | Admit: 2013-05-15 | Discharge: 2013-05-17 | DRG: 440 | Disposition: A | Discharge status: Home / Self Care | Payer: Medicare Other | Attending: Internal Medicine | Admitting: Internal Medicine

## 2013-05-15 DIAGNOSIS — D638 Anemia in other chronic diseases classified elsewhere: Secondary | ICD-10-CM | POA: Diagnosis present

## 2013-05-15 DIAGNOSIS — R079 Chest pain, unspecified: Secondary | ICD-10-CM | POA: Diagnosis not present

## 2013-05-15 DIAGNOSIS — M545 Low back pain, unspecified: Secondary | ICD-10-CM

## 2013-05-15 DIAGNOSIS — R1011 Right upper quadrant pain: Secondary | ICD-10-CM

## 2013-05-15 DIAGNOSIS — J449 Chronic obstructive pulmonary disease, unspecified: Secondary | ICD-10-CM | POA: Diagnosis present

## 2013-05-15 DIAGNOSIS — D72829 Elevated white blood cell count, unspecified: Secondary | ICD-10-CM

## 2013-05-15 DIAGNOSIS — Z01810 Encounter for preprocedural cardiovascular examination: Secondary | ICD-10-CM

## 2013-05-15 DIAGNOSIS — F411 Generalized anxiety disorder: Secondary | ICD-10-CM

## 2013-05-15 DIAGNOSIS — E039 Hypothyroidism, unspecified: Secondary | ICD-10-CM

## 2013-05-15 DIAGNOSIS — R7309 Other abnormal glucose: Secondary | ICD-10-CM | POA: Diagnosis present

## 2013-05-15 DIAGNOSIS — R9389 Abnormal findings on diagnostic imaging of other specified body structures: Secondary | ICD-10-CM

## 2013-05-15 DIAGNOSIS — R935 Abnormal findings on diagnostic imaging of other abdominal regions, including retroperitoneum: Secondary | ICD-10-CM | POA: Diagnosis not present

## 2013-05-15 DIAGNOSIS — R059 Cough, unspecified: Secondary | ICD-10-CM

## 2013-05-15 DIAGNOSIS — I739 Peripheral vascular disease, unspecified: Secondary | ICD-10-CM | POA: Diagnosis present

## 2013-05-15 DIAGNOSIS — B001 Herpesviral vesicular dermatitis: Secondary | ICD-10-CM

## 2013-05-15 DIAGNOSIS — Z79899 Other long term (current) drug therapy: Secondary | ICD-10-CM | POA: Diagnosis not present

## 2013-05-15 DIAGNOSIS — K2990 Gastroduodenitis, unspecified, without bleeding: Secondary | ICD-10-CM | POA: Diagnosis not present

## 2013-05-15 DIAGNOSIS — D649 Anemia, unspecified: Secondary | ICD-10-CM | POA: Diagnosis not present

## 2013-05-15 DIAGNOSIS — E785 Hyperlipidemia, unspecified: Secondary | ICD-10-CM | POA: Diagnosis present

## 2013-05-15 DIAGNOSIS — K859 Acute pancreatitis without necrosis or infection, unspecified: Secondary | ICD-10-CM | POA: Diagnosis not present

## 2013-05-15 DIAGNOSIS — R112 Nausea with vomiting, unspecified: Secondary | ICD-10-CM | POA: Diagnosis not present

## 2013-05-15 DIAGNOSIS — R7303 Prediabetes: Secondary | ICD-10-CM | POA: Diagnosis present

## 2013-05-15 DIAGNOSIS — M8580 Other specified disorders of bone density and structure, unspecified site: Secondary | ICD-10-CM

## 2013-05-15 DIAGNOSIS — Z7982 Long term (current) use of aspirin: Secondary | ICD-10-CM | POA: Diagnosis not present

## 2013-05-15 DIAGNOSIS — M129 Arthropathy, unspecified: Secondary | ICD-10-CM

## 2013-05-15 DIAGNOSIS — I10 Essential (primary) hypertension: Secondary | ICD-10-CM

## 2013-05-15 DIAGNOSIS — S72009A Fracture of unspecified part of neck of unspecified femur, initial encounter for closed fracture: Secondary | ICD-10-CM

## 2013-05-15 DIAGNOSIS — K828 Other specified diseases of gallbladder: Secondary | ICD-10-CM | POA: Diagnosis not present

## 2013-05-15 DIAGNOSIS — R05 Cough: Secondary | ICD-10-CM

## 2013-05-15 DIAGNOSIS — K2970 Gastritis, unspecified, without bleeding: Secondary | ICD-10-CM | POA: Diagnosis not present

## 2013-05-15 DIAGNOSIS — J4489 Other specified chronic obstructive pulmonary disease: Secondary | ICD-10-CM | POA: Diagnosis present

## 2013-05-15 LAB — COMPREHENSIVE METABOLIC PANEL
ALT: 15 U/L (ref 0–35)
AST: 18 U/L (ref 0–37)
Albumin: 3.9 g/dL (ref 3.5–5.2)
Alkaline Phosphatase: 72 U/L (ref 39–117)
BUN: 23 mg/dL (ref 6–23)
CO2: 24 mEq/L (ref 19–32)
Calcium: 9 mg/dL (ref 8.4–10.5)
Chloride: 105 mEq/L (ref 96–112)
Creatinine, Ser: 0.91 mg/dL (ref 0.50–1.10)
GFR calc Af Amer: 64 mL/min — ABNORMAL LOW (ref >90)
GFR calc non Af Amer: 56 mL/min — ABNORMAL LOW (ref >90)
Glucose, Bld: 142 mg/dL — ABNORMAL HIGH (ref 70–99)
Potassium: 3.9 mEq/L (ref 3.7–5.3)
Sodium: 144 mEq/L (ref 137–147)
Total Bilirubin: 0.7 mg/dL (ref 0.3–1.2)
Total Protein: 7.1 g/dL (ref 6.0–8.3)

## 2013-05-15 LAB — LIPASE, BLOOD: Lipase: 93 U/L — ABNORMAL HIGH (ref 11–59)

## 2013-05-15 LAB — CBC WITH DIFFERENTIAL/PLATELET
Basophils Absolute: 0 K/uL (ref 0.0–0.1)
Basophils Relative: 0 % (ref 0–1)
Eosinophils Absolute: 0.2 K/uL (ref 0.0–0.7)
Eosinophils Relative: 2 % (ref 0–5)
HCT: 31.8 % — ABNORMAL LOW (ref 36.0–46.0)
Hemoglobin: 10.6 g/dL — ABNORMAL LOW (ref 12.0–15.0)
Lymphocytes Relative: 3 % — ABNORMAL LOW (ref 12–46)
Lymphs Abs: 0.4 K/uL — ABNORMAL LOW (ref 0.7–4.0)
MCH: 33.7 pg (ref 26.0–34.0)
MCHC: 33.3 g/dL (ref 30.0–36.0)
MCV: 101 fL — ABNORMAL HIGH (ref 78.0–100.0)
Monocytes Absolute: 0.7 K/uL (ref 0.1–1.0)
Monocytes Relative: 5 % (ref 3–12)
Neutro Abs: 12.6 K/uL — ABNORMAL HIGH (ref 1.7–7.7)
Neutrophils Relative %: 90 % — ABNORMAL HIGH (ref 43–77)
Platelets: 267 K/uL (ref 150–400)
RBC: 3.15 MIL/uL — ABNORMAL LOW (ref 3.87–5.11)
RDW: 20.2 % — ABNORMAL HIGH (ref 11.5–15.5)
WBC: 13.9 K/uL — ABNORMAL HIGH (ref 4.0–10.5)

## 2013-05-15 LAB — IRON AND TIBC
Iron: 33 ug/dL — ABNORMAL LOW (ref 42–135)
Saturation Ratios: 15 % — ABNORMAL LOW (ref 20–55)
TIBC: 221 ug/dL — ABNORMAL LOW (ref 250–470)
UIBC: 188 ug/dL (ref 125–400)

## 2013-05-15 LAB — RETICULOCYTES
RBC.: 2.85 MIL/uL — ABNORMAL LOW (ref 3.87–5.11)
Retic Count, Absolute: 39.9 10*3/uL (ref 19.0–186.0)
Retic Ct Pct: 1.4 % (ref 0.4–3.1)

## 2013-05-15 LAB — FOLATE: Folate: >20 ng/mL

## 2013-05-15 LAB — FERRITIN: Ferritin: 273 ng/mL (ref 10–291)

## 2013-05-15 LAB — TROPONIN I: Troponin I: <0.3 ng/mL (ref <0.30)

## 2013-05-15 LAB — VITAMIN B12: Vitamin B-12: 523 pg/mL (ref 211–911)

## 2013-05-15 MED ORDER — MORPHINE SULFATE 4 MG/ML IJ SOLN: 4.0000 mg | INTRAMUSCULAR | Status: DC | PRN

## 2013-05-15 MED ORDER — HYDROMORPHONE HCL PF 1 MG/ML IJ SOLN: 0.5000 mg | INTRAMUSCULAR | Status: DC | PRN

## 2013-05-15 MED ORDER — AMLODIPINE BESYLATE 5 MG PO TABS
5.0000 mg | ORAL_TABLET | Freq: Every day | ORAL | Status: DC
  Administered 2013-05-16 – 2013-05-17 (×2): 5 mg via ORAL
  Filled 2013-05-15 (×3): qty 1

## 2013-05-15 MED ORDER — BENAZEPRIL HCL 20 MG PO TABS
20.0000 mg | ORAL_TABLET | Freq: Every day | ORAL | Status: DC
  Administered 2013-05-15 – 2013-05-17 (×3): 20 mg via ORAL
  Filled 2013-05-15 (×3): qty 1

## 2013-05-15 MED ORDER — SODIUM CHLORIDE 0.9 % IV SOLN
Freq: Once | INTRAVENOUS | Status: AC
  Administered 2013-05-15: 03:00:00 via INTRAVENOUS

## 2013-05-15 MED ORDER — ONDANSETRON HCL 4 MG/2ML IJ SOLN: 4.0000 mg | Freq: Four times a day (QID) | INTRAMUSCULAR | Status: DC | PRN

## 2013-05-15 MED ORDER — ONDANSETRON HCL 4 MG/2ML IJ SOLN
4.0000 mg | Freq: Once | INTRAMUSCULAR | Status: AC
  Administered 2013-05-15: 4 mg via INTRAVENOUS
  Filled 2013-05-15: qty 2

## 2013-05-15 MED ORDER — ADULT MULTIVITAMIN W/MINERALS CH
1.0000 | ORAL_TABLET | Freq: Every day | ORAL | Status: DC
  Filled 2013-05-15 (×3): qty 1

## 2013-05-15 MED ORDER — IOHEXOL 300 MG/ML  SOLN
100.0000 mL | Freq: Once | INTRAMUSCULAR | Status: AC | PRN
  Administered 2013-05-15: 100 mL via INTRAVENOUS

## 2013-05-15 MED ORDER — LEVOTHYROXINE SODIUM 125 MCG PO TABS
125.0000 ug | ORAL_TABLET | Freq: Every day | ORAL | Status: DC
  Administered 2013-05-15 – 2013-05-17 (×3): 125 ug via ORAL
  Filled 2013-05-15 (×4): qty 1

## 2013-05-15 MED ORDER — SODIUM CHLORIDE 0.9 % IV SOLN
INTRAVENOUS | Status: DC
  Administered 2013-05-15: 09:00:00 via INTRAVENOUS
  Administered 2013-05-15: 75 mL/h via INTRAVENOUS

## 2013-05-15 MED ORDER — OXYCODONE HCL 5 MG PO TABS: 5.0000 mg | ORAL_TABLET | ORAL | Status: DC | PRN

## 2013-05-15 MED ORDER — MORPHINE SULFATE 4 MG/ML IJ SOLN
4.0000 mg | Freq: Once | INTRAMUSCULAR | Status: AC
  Administered 2013-05-15: 4 mg via INTRAVENOUS
  Filled 2013-05-15: qty 1

## 2013-05-15 MED ORDER — ENOXAPARIN SODIUM 40 MG/0.4ML ~~LOC~~ SOLN
40.0000 mg | SUBCUTANEOUS | Status: DC
  Administered 2013-05-15 – 2013-05-16 (×2): 40 mg via SUBCUTANEOUS
  Filled 2013-05-15 (×4): qty 0.4

## 2013-05-15 MED ORDER — ALUM & MAG HYDROXIDE-SIMETH 200-200-20 MG/5ML PO SUSP: 30.0000 mL | Freq: Four times a day (QID) | ORAL | Status: DC | PRN

## 2013-05-15 MED ORDER — METHOCARBAMOL 500 MG PO TABS: 500.0000 mg | ORAL_TABLET | Freq: Three times a day (TID) | ORAL | Status: DC | PRN

## 2013-05-15 MED ORDER — ONDANSETRON HCL 4 MG/2ML IJ SOLN
4.0000 mg | INTRAMUSCULAR | Status: AC | PRN
  Administered 2013-05-15: 4 mg via INTRAVENOUS
  Filled 2013-05-15: qty 2

## 2013-05-15 MED ORDER — CALCIUM CARBONATE 1250 (500 CA) MG PO TABS
1250.0000 mg | ORAL_TABLET | Freq: Two times a day (BID) | ORAL | Status: DC
  Filled 2013-05-15 (×7): qty 1

## 2013-05-15 MED ORDER — ONDANSETRON HCL 4 MG PO TABS: 4.0000 mg | ORAL_TABLET | Freq: Four times a day (QID) | ORAL | Status: DC | PRN

## 2013-05-15 MED ORDER — VITAMIN D 1000 UNITS PO TABS
2000.0000 [IU] | ORAL_TABLET | Freq: Every day | ORAL | Status: DC
  Filled 2013-05-15 (×3): qty 2

## 2013-05-15 MED ORDER — SODIUM CHLORIDE 0.9 % IV SOLN: INTRAVENOUS | Status: AC

## 2013-05-15 MED ORDER — ACETAMINOPHEN 650 MG RE SUPP
650.0000 mg | Freq: Four times a day (QID) | RECTAL | Status: DC | PRN
Start: 2013-05-15 — End: 2013-05-17

## 2013-05-15 MED ORDER — ACETAMINOPHEN 325 MG PO TABS
650.0000 mg | ORAL_TABLET | Freq: Four times a day (QID) | ORAL | Status: DC | PRN
  Administered 2013-05-15 (×2): 650 mg via ORAL
  Filled 2013-05-15 (×2): qty 2

## 2013-05-15 NOTE — H&P (Signed)
[facility] History and Physical  Brittney Gates MBE:[id number] DOB: Aug 26, 1924 DOA: 05/15/2013  Referring physician:  EDP PCP: Gwendolyn Grant, MD  Specialists: GI Dr. Delfin Edis    Chief Complaint: ABD Pain Nausea and Vomiting  HPI: SHAKEA ISIP is a 80 y.o. female with a history of HTN, PAD, Hyperlipidemia, and Hypothyroidism who present to the ED with complaints of sudden onset of ABD pain which was in the Epigastric and RUQ Area that she rated was a 4/10 at the worse, along with nausea and vomiting since 9 pm.   She has problems with constipation chronically, but reports that she had 2 BMS since the pain began.   She denies having hematemesis.  She also denies having diarrhea, or fevers or chills.   She was evaluated in the ED and labs revealed an elevated Lipase level at 93, and she was subsequently sent for and ABD [place] which was negative for acute or chronic Gall Bladder Disease findings, but did reveal dilation of the pancreatic duct.  She was referred for medical admission.            Review of Systems:  Constitutional: No Weight Loss, No Weight Gain, Night Sweats, Fevers, Chills, Fatigue, or Generalized Weakness HEENT: No Headaches, Difficulty Swallowing,Tooth/Dental Problems,Sore Throat,  No Sneezing, Rhinitis, Ear Ache, Nasal Congestion, or Post Nasal Drip,  Cardio-vascular:  No Chest pain, Orthopnea, PND, Edema in lower extremities, Anasarca, Dizziness, Palpitations  Resp: No Dyspnea, No DOE, No Productive Cough, No Non-Productive Cough, No Hemoptysis, No Change in Color of Mucus,  No Wheezing.    GI: No Heartburn, Indigestion, +Abdominal Pain, +Nausea, +Vomiting, Diarrhea, Change in Bowel Habits,  Loss of Appetite  GU: No Dysuria, Change in Color of Urine, No Urgency or Frequency.  No flank pain.  Musculoskeletal: No Joint Pain or Swelling.  No Decreased Range of Motion. No Back Pain.  Neurologic: No Syncope, No Seizures, Muscle Weakness, Paresthesia, Vision Disturbance or  Loss, No Diplopia, No Vertigo, No Difficulty Walking,  Skin: No Rash or Lesions. Psych: No Change in Mood or Affect. No Depression or Anxiety. No Memory loss. No Confusion or Hallucinations   Past Medical History  Diagnosis Date  . LOW BACK PAIN, CHRONIC   . Diverticulosis   . ANEMIA   . ANXIETY   . Arthritis   . HYPERTENSION   . HYPERLIPIDEMIA   . HYPOTHYROIDISM   . PAD (peripheral artery disease)   . Hiatal hernia   . Gastritis   . Hemorrhoids       Past Surgical History  Procedure Laterality Date  . Back surgery      x's 2  . Hemorrhoid surgery    . Angioplasty  1995  . Tubal ligation    . Breast surgery      Benign  . Appendectomy    . Femur im nail  01/21/2012    Procedure: INTRAMEDULLARY (IM) NAIL FEMORAL;  Surgeon: Newt Minion, MD;  Location: [facility];  Service: Orthopedics;  Laterality: Left;  Intertrochanteric Nail Left Hip       Prior to Admission medications   Medication Sig Start Date End Date Taking? Authorizing Provider  amLODipine-benazepril (LOTREL) 5-20 MG per capsule Take 1 capsule by mouth daily.   Yes Historical Provider, MD  aspirin EC 81 MG tablet Take 81 mg by mouth daily.   Yes Historical Provider, MD  B Complex-C (SUPER B COMPLEX PO) Take 1 tablet by mouth daily.   Yes Historical Provider, MD  calcium carbonate (OS-CAL) 600 MG TABS Take 600 mg by mouth 2 (two) times daily with a meal.   Yes Historical Provider, MD  Cholecalciferol 2000 UNITS TABS Take 2,000 Units by mouth daily.   Yes Historical Provider, MD  levothyroxine (SYNTHROID, LEVOTHROID) 125 MCG tablet Take 125 mcg by mouth daily before breakfast.   Yes Historical Provider, MD  methocarbamol (ROBAXIN) 500 MG tablet Take 500 mg by mouth every 8 (eight) hours as needed for muscle spasms.   Yes Historical Provider, MD  Multiple Vitamin (MULTIVITAMIN WITH MINERALS) TABS tablet Take 1 tablet by mouth daily.   Yes Historical Provider, MD  zolpidem (AMBIEN) 10 MG tablet Take 10 mg by mouth at  bedtime.   Yes Historical Provider, MD      Allergies  Allergen Reactions  . Statins Other (See Comments)    Leg weakness     Social History:  reports that she has never smoked. She does not have any smokeless tobacco history on file. She reports that she does not drink alcohol or use illicit drugs.     Family History  Problem Relation Age of Onset  . Lung cancer Father   . Gallbladder disease Son   . Gallbladder disease Daughter   . Colon cancer Neg Hx        Physical Exam:  GEN:  Pleasant Obese Elderly  80 y.o. Caucasian female  examined  and in no acute distress; cooperative with exam Filed Vitals:   05/15/13 0400 05/15/13 0430 05/15/13 0515 05/15/13 0750  BP: 146/67 138/60 134/58 137/63  Pulse:    96  Temp:    97.5 F (36.4 C)  TempSrc:      Resp: 20 13 20 18   SpO2:    98%   Blood pressure 137/63, pulse 96, temperature 97.5 F (36.4 C), temperature source Oral, resp. rate 18, SpO2 98.00%. PSYCH: She is alert and oriented x4; does not appear anxious does not appear depressed; affect is normal HEENT: Normocephalic and Atraumatic, Mucous membranes pink; PERRLA; EOM intact; Fundi:  Benign;  No scleral icterus, Nares: Patent, Oropharynx: Clear, Edentulous  With Dentures,  Neck:  FROM, no cervical lymphadenopathy nor thyromegaly or carotid bruit; no JVD; Breasts:: Not examined CHEST WALL: No tenderness CHEST: Normal respiration, clear to auscultation bilaterally HEART: Regular rate and rhythm; no murmurs rubs or gallops BACK: No kyphosis or scoliosis; no CVA tenderness ABDOMEN: Positive Bowel Sounds, soft non-tender; no masses, no organomegaly, no pannus; no intertriginous candida. Rectal Exam: Not done EXTREMITIES: No cyanosis, clubbing or edema; no ulcerations. Genitalia: not examined PULSES: 2+ and symmetric SKIN: Normal hydration no rash or ulceration CNS:   Mental Status:  Gait: deferred  Vascular: pulses palpable throughout    Labs on Admission:  Basic  Metabolic Panel:  Recent Labs Lab 05/15/13 0309  NA 144  K 3.9  CL 105  CO2 24  GLUCOSE 142*  BUN 23  CREATININE 0.91  CALCIUM 9.0   Liver Function Tests:  Recent Labs Lab 05/15/13 0309  AST 18  ALT 15  ALKPHOS 72  BILITOT 0.7  PROT 7.1  ALBUMIN 3.9    Recent Labs Lab 05/15/13 0309  LIPASE 93*   No results found for this basename: AMMONIA,  in the last 168 hours CBC:  Recent Labs Lab 05/15/13 0309  WBC 13.9*  NEUTROABS 12.6*  HGB 10.6*  HCT 31.8*  MCV 101.0*  PLT 267   Cardiac Enzymes:  Recent Labs Lab 05/15/13 0309  TROPONINI <0.30  BNP (last 3 results) No results found for this basename: PROBNP,  in the last 8760 hours CBG: No results found for this basename: GLUCAP,  in the last 168 hours  Radiological Exams on Admission: US Abdomen Complete  05/15/2013   CLINICAL DATA:  Abdominal pain/pancreatitis.  EXAM: ULTRASOUND ABDOMEN COMPLETE  COMPARISON:  04/08/2007  FINDINGS: Gallbladder:  Gallbladder is mildly distended without evidence of gallstones, sludge or wall thickening. No pericholecystic fluid. Negative sonographic Murphy's sign.  Common bile duct:  Diameter: 8.3 mm.  No evidence of ductal stones.  Liver:  No focal lesion identified. Within normal limits in parenchymal echogenicity.  IVC:  No abnormality visualized.  Pancreas:  Visualized portion unremarkable. Borderline enlargement of the pancreatic duct measuring 3.3 mm.  Spleen:  Size and appearance within normal limits.  Right Kidney:  Length: 10.1 cm. Echogenicity within normal limits. No mass or hydronephrosis visualized.  Left Kidney:  Length: 9.4 cm. Echogenicity within normal limits. No mass or hydronephrosis visualized.  Abdominal aorta:  No aneurysm visualized.  Other findings:  None.  IMPRESSION: Mild gallbladder distention without evidence of stones/sludge or wall thickening. Minimal prominence of the common bowel duct and main pancreatic duct. No evidence of ductal stones. Consider CT  to exclude pancreatic head/ ampullary mass.   Electronically Signed   By: Marin Olp M.D.   On: 05/15/2013 07:06      EKG: Independently reviewed. Normal sinus Rhythm rate 97, with occasional PVCs.      Assessment/Plan:   80 y.o. female with  Principal Problem:   Acute pancreatitis Active Problems:   Abdominal pain, right upper quadrant   HYPOTHYROIDISM   HYPERLIPIDEMIA   HYPERTENSION   ANEMIA   DIABETES MELLITUS, BORDERLINE   Abnormal CT scan, chest   Leukocytosis, unspecified    1.  Acute Pancreatitis-   Pain control PRN with IV dilaudid, and IV Anti-Emetics PRN,  IVfs for maintenance fluid, and Clear liquid Diet as tolerated,   GI consult in AM.  Family Requests Dr Delfin Edis.    2.   ABD Pain RUQ and Epigastric- deu to #1,  Sent for ABD [place] Study which revealled dilated pancreatic Duct, and Negative Gall Bladder findings for stones or Inflammation.    3.   Hypothyroid- continue Levothyroxine Rx, and check TSH level.    4.   HTN- continue    Monitor BPs,  Adjust PRN.      5.  Anemia-  With Macrocytic Indices, send Anemia Panel.     6.  Borderline DM2-  Check HbA1C, and  Added SSI coverage PRN.    7.  Abnormal CT scan of Chest ( Pulmonary Nodule) - stable per previous films and being monitored by PCP.    8.  Leukocytosis-   Due to Stress Reaction, but Infection may be part of the Differential,  Monitor Trend.    9.  DVT prophylaxis with Lovenox.        Code Status:      FULL CODE Family Communication:    No Family at Bedside Disposition Plan:     Inpatient  Time spent:   [place] [facility] Pager [phone]  If 7PM-7AM, please contact night-coverage [email or web address] Password [email or web address] 05/15/2013, 8:11 AM

## 2013-05-15 NOTE — ED Notes (Addendum)
Report given to 5N floor RN. Floor rn informed of patient need for high fall risk/camera room

## 2013-05-15 NOTE — ED Notes (Signed)
Approx 2100 last night began feeling "sickness all over" then progressed to vomiting several times.  Now c/o pain across upper abdomen. Took several medications to try to get rid of the illness, did not work.

## 2013-05-15 NOTE — ED Notes (Signed)
Pt son requests Elicia Lamp from [facility] on [address] to know of patients admission.

## 2013-05-15 NOTE — Progress Notes (Signed)
[facility] PROGRESS NOTE  Brittney Gates FYB:[id number] DOB: 01/03/1925 DOA: 05/15/2013 PCP: Gwendolyn Grant, MD  Brief narrative: Addendum to admission note done today 05/15/2013 80 y.o. female with a history of HTN, PAD, Hyperlipidemia, and Hypothyroidism who presented to the [facility] ED 05/15/2013 with acute onset mid abdomen pain radiating to the bilateral sides. Initial abdominal pain was of burning sensation and radiated as pain to right and left side, about 4-5/10 in intensity. She had no reports of fevers. No blood in stool or urine. Initial lipase level was 93. Abd US showed mild gallbladder distention without evidence of stones/sludge or wall thickening; there was minimal prominence of the common bowel duct and main pancreatic duct, no evidence of ductal stones, may consider CT to exclude pancreatic head/ ampullary mass.  Assessment/Plan:  Principal Problem:   Nausea, vomiting, abdominal pain  - secondary to acute pancreatitis - lipase level on admission, 93 - LFT's WNL - abdominal US showed mild gallbladder distention without evidence of stones/sludge or wall thickening. Minimal prominence of the common bowel duct and main pancreatic duct. No evidence of ductal stones. Recommendation was to obtain CT abd for further evaluation of possible pancreatic mass. CT abdomen ordered - will advance diet to CLD and see if pt tolerates it - if no significant mprovement will consider GI consult  - pain management with morphine 4 mg IV every 2 hours PRN severe pain - antiemetics IV PRN Active Problems:   Acute pancreatitis - management as above   Hypertension - continue Norvasc and Lotensin - BP 137/63 - renal fxn WNL  Code Status: full code Family Communication: no family at the bedside  Disposition Plan: home when stable  Leisa Lenz, MD  [facility] Pager [phone]  If 7PM-7AM, please contact night-coverage [email or web address] Password [email or web address] 05/15/2013, 10:46 AM   LOS: 0 days    Consultants:  None   Procedures:  None   Antibiotics:  None   HPI/Subjective: Still with mid abdomen tenderness.   Objective: Filed Vitals:   05/15/13 0400 05/15/13 0430 05/15/13 0515 05/15/13 0750  BP: 146/67 138/60 134/58 137/63  Pulse:    96  Temp:    97.5 F (36.4 C)  TempSrc:      Resp: 20 13 20 18   SpO2:    98%   No intake or output data in the 24 hours ending 05/15/13 1046  Exam:   General:  Pt is alert, follows commands appropriately, not in acute distress  Cardiovascular: Regular rate and rhythm, S1/S2, no murmurs, no rubs, no gallops  Respiratory: Clear to auscultation bilaterally, no wheezing, no crackles, no rhonchi  Abdomen: tender in mid abdomen to deep palpation, non distended, bowel sounds present, no guarding  Extremities: No edema, pulses DP and PT palpable bilaterally  Neuro: Grossly nonfocal  Data Reviewed: Basic Metabolic Panel:  Recent Labs Lab 05/15/13 0309  NA 144  K 3.9  CL 105  CO2 24  GLUCOSE 142*  BUN 23  CREATININE 0.91  CALCIUM 9.0   Liver Function Tests:  Recent Labs Lab 05/15/13 0309  AST 18  ALT 15  ALKPHOS 72  BILITOT 0.7  PROT 7.1  ALBUMIN 3.9    Recent Labs Lab 05/15/13 0309  LIPASE 93*   No results found for this basename: AMMONIA,  in the last 168 hours CBC:  Recent Labs Lab 05/15/13 0309  WBC 13.9*  NEUTROABS 12.6*  HGB 10.6*  HCT 31.8*  MCV 101.0*  PLT 267   Cardiac Enzymes:  Recent  Labs Lab 05/15/13 0309  TROPONINI <0.30   BNP: No components found with this basename: POCBNP,  CBG: No results found for this basename: GLUCAP,  in the last 168 hours  No results found for this or any previous visit (from the past 240 hour(s)).   Studies: US Abdomen Complete 05/15/2013    IMPRESSION: Mild gallbladder distention without evidence of stones/sludge or wall thickening. Minimal prominence of the common bowel duct and main pancreatic duct. No evidence of ductal stones. Consider CT  to exclude pancreatic head/ ampullary mass.     Scheduled Meds: . amLODipine  5 mg Oral Daily  . benazepril  20 mg Oral Daily  . calcium carbonate  1,250 mg Oral BID WC  . cholecalciferol  2,000 Units Oral Daily  . enoxaparin (LOVENOX)   40 mg Subcutaneous Q24H  . levothyroxine  125 mcg Oral QAC breakfast  . multivitamin  1 tablet Oral Daily   Continuous Infusions: . sodium chloride 75 mL/hr at 05/15/13 [phone]

## 2013-05-15 NOTE — ED Provider Notes (Signed)
CSN: [id number]     Arrival date & time 05/15/13  0247 History   First MD Initiated Contact with Patient 05/15/13 0259     Chief Complaint  Patient presents with  . Chest Pain     (Consider location/radiation/quality/duration/timing/severity/associated sxs/prior Treatment) Patient is a 80 y.o. female presenting with chest pain. The history is provided by the patient.  Chest Pain She she is not complaining of chest pain. She noted onset at about 9 PM of generalized malaise. At 10 PM, she started develop nausea and vomiting. She notices that she feels dizzy when she stands up. There is mild diaphoresis but no dyspnea. Nothing made her symptoms better and nothing made it worse. She came in by ambulance and received ondansetron, and is now feeling considerably better.  Past Medical History  Diagnosis Date  . LOW BACK PAIN, CHRONIC   . Diverticulosis   . ANEMIA   . ANXIETY   . Arthritis   . HYPERTENSION   . HYPERLIPIDEMIA   . HYPOTHYROIDISM   . PAD (peripheral artery disease)   . Hiatal hernia   . Gastritis   . Hemorrhoids    Past Surgical History  Procedure Laterality Date  . Back surgery      x's 2  . Hemorrhoid surgery    . Angioplasty  1995  . Tubal ligation    . Breast surgery      Benign  . Appendectomy    . Femur im nail  01/21/2012    Procedure: INTRAMEDULLARY (IM) NAIL FEMORAL;  Surgeon: Nadara Mustard, MD;  Location: [facility] OR;  Service: Orthopedics;  Laterality: Left;  Intertrochanteric Nail Left Hip   Family History  Problem Relation Age of Onset  . Lung cancer Father   . Gallbladder disease Son   . Gallbladder disease Daughter   . Colon cancer Neg Hx    History  Substance Use Topics  . Smoking status: Never Smoker   . Smokeless tobacco: Not on file     Comment: Married, lives with spouse Nadine Counts x 67 yrs. 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 Alcohol Use: No   OB History   Grav Para Term Preterm Abortions TAB SAB Ect Mult Living           Review of Systems  Cardiovascular: Positive for chest pain.  All other systems reviewed and are negative.      Allergies  Statins  Home Medications   Current Outpatient Rx  Name  Route  Sig  Dispense  Refill  . amLODipine-benazepril (LOTREL) 5-20 MG per capsule      TAKE ONE CAPSULE BY MOUTH DAILY.   30 capsule   5   . aspirin EC 81 MG tablet   Oral   Take 81 mg by mouth daily.         . calcium carbonate (OS-CAL) 600 MG TABS   Oral   Take 600 mg by mouth 2 (two) times daily with a meal.         . Cholecalciferol 2000 UNITS TABS   Oral   Take 2,000 Units by mouth daily.         Marland Kitchen HYDROcodone-homatropine (HYCODAN) 5-1.5 MG/5ML syrup   Oral   Take 5 mLs by mouth every 6 (six) hours as needed for cough.   180 mL   0   . levofloxacin (LEVAQUIN) 250 MG tablet   Oral   Take 1 tablet (250 mg total) by mouth daily.  10 tablet   0   . levothyroxine (SYNTHROID, LEVOTHROID) 125 MCG tablet      TAKE 1 TABLET (125 MCG TOTAL) BY MOUTH DAILY BEFORE BREAKFAST.   30 tablet   4     CYCLE FILL MEDICATION. Authorization is required f ...   . naproxen (NAPROSYN) 500 MG tablet   Oral   Take 500 mg by mouth as needed.         . zolpidem (AMBIEN) 10 MG tablet      TAKE 1 TABLET BY MOUTH AT BEDTIME AS NEEDED FOR SLEEP   30 tablet   1     CYCLE FILL MEDICATION. Authorization is required f ...    BP 114/68  Temp(Src) 98.2 F (36.8 C) (Oral)  Resp 18  SpO2 100% Physical Exam  Nursing note and vitals reviewed.  81 year old female, resting comfortably and in no acute distress. Vital signs are normal. Oxygen saturation is 100%, which is normal. Head is normocephalic and atraumatic. PERRLA, EOMI. Oropharynx is clear. Neck is nontender and supple without adenopathy or JVD. Back is nontender and there is no CVA tenderness. Lungs are clear without rales, wheezes, or rhonchi. Chest is nontender. Heart has regular rate and rhythm without murmur. Abdomen  is soft, flat,with moderate epigastric tenderness. There is no rebound or guarding. There are no masses or hepatosplenomegaly and peristalsis is hypoactive. Extremities have no cyanosis or edema, full range of motion is present. Skin is warm and dry without rash. Neurologic: Mental status is normal, cranial nerves are intact, there are no motor or sensory deficits.  ED Course  Procedures (including critical care time) Labs Review Results for orders placed during the hospital encounter of 05/15/13  CBC WITH DIFFERENTIAL      Result Value Ref Range   WBC 13.9 (*) 4.0 - 10.5 K/uL   RBC 3.15 (*) 3.87 - 5.11 MIL/uL   Hemoglobin 10.6 (*) 12.0 - 15.0 g/dL   HCT 31.8 (*) 36.0 - 46.0 %   MCV 101.0 (*) 78.0 - 100.0 fL   MCH 33.7  26.0 - 34.0 pg   MCHC 33.3  30.0 - 36.0 g/dL   RDW 20.2 (*) 11.5 - 15.5 %   Platelets 267  150 - 400 K/uL   Neutrophils Relative % 90 (*) 43 - 77 %   Neutro Abs 12.6 (*) 1.7 - 7.7 K/uL   Lymphocytes Relative 3 (*) 12 - 46 %   Lymphs Abs 0.4 (*) 0.7 - 4.0 K/uL   Monocytes Relative 5  3 - 12 %   Monocytes Absolute 0.7  0.1 - 1.0 K/uL   Eosinophils Relative 2  0 - 5 %   Eosinophils Absolute 0.2  0.0 - 0.7 K/uL   Basophils Relative 0  0 - 1 %   Basophils Absolute 0.0  0.0 - 0.1 K/uL  COMPREHENSIVE METABOLIC PANEL      Result Value Ref Range   Sodium 144  137 - 147 mEq/L   Potassium 3.9  3.7 - 5.3 mEq/L   Chloride 105  96 - 112 mEq/L   CO2 24  19 - 32 mEq/L   Glucose, Bld 142 (*) 70 - 99 mg/dL   BUN 23  6 - 23 mg/dL   Creatinine, Ser 0.91  0.50 - 1.10 mg/dL   Calcium 9.0  8.4 - 10.5 mg/dL   Total Protein 7.1  6.0 - 8.3 g/dL   Albumin 3.9  3.5 - 5.2 g/dL   AST 18  0 - 37 U/L   ALT 15  0 - 35 U/L   Alkaline Phosphatase 72  39 - 117 U/L   Total Bilirubin 0.7  0.3 - 1.2 mg/dL   GFR calc non Af Amer 56 (*) >90 mL/min   GFR calc Af Amer 64 (*) >90 mL/min  LIPASE, BLOOD      Result Value Ref Range   Lipase 93 (*) 11 - 59 U/L  TROPONIN I      Result Value Ref  Range   Troponin I <0.30  <0.30 ng/mL    EKG Interpretation    Date/Time:  Sunday May 15 2013 02:57:09 EST Ventricular Rate:  97 PR Interval:  189 QRS Duration: 113 QT Interval:  366 QTC Calculation: 465 R Axis:   -4 Text Interpretation:  Age not entered, assumed to be  80 years old for purpose of ECG interpretation Sinus tachycardia Ventricular premature complex Borderline intraventricular conduction delay Nonspecific T abnormalities, lateral leads When compared with ECG of 01/20/2012, QT has shortened Confirmed by Roxanne Mins  MD, Jontavious Commons ([id number]) on 05/15/2013 3:11:53 AM            MDM   Final diagnoses:  Pancreatitis    Epigastric discomfort and nausea of uncertain cause. ECG is unremarkable. Screening labs were obtained including hepatic functions and lipase.  Workup is significant for elevated lipase. Etiology of pancreatitis is unclear. She does not have a history of alcohol abuse and only has mild hyperlipidemia. Ultrasound will be obtained to evaluate for possible cholelithiasis. In the meantime, arrangements are made to admit the patient. Case has been discussed with Dr. Arnoldo Morale of [facility] who agrees to admit the patient.  Delora Fuel, MD [id number]/[id number]

## 2013-05-15 NOTE — ED Notes (Signed)
[place] (son) lives with patient, states to call and update upon admission.

## 2013-05-15 NOTE — ED Notes (Signed)
Pt states "My chest does not hurt, my stomach hurts." States she has problems having bowel movements but that she had one today.

## 2013-05-15 NOTE — ED Notes (Signed)
This RN called to ultrasound to help assist patient from toilet to stretcher with help of U/S staff. Pt ambulated with maximum assistance.

## 2013-05-16 ENCOUNTER — Ambulatory Visit: Payer: Medicare Other | Admitting: Internal Medicine

## 2013-05-16 DIAGNOSIS — D649 Anemia, unspecified: Secondary | ICD-10-CM | POA: Diagnosis not present

## 2013-05-16 DIAGNOSIS — D72829 Elevated white blood cell count, unspecified: Secondary | ICD-10-CM | POA: Diagnosis not present

## 2013-05-16 DIAGNOSIS — K859 Acute pancreatitis without necrosis or infection, unspecified: Secondary | ICD-10-CM | POA: Diagnosis not present

## 2013-05-16 DIAGNOSIS — R1011 Right upper quadrant pain: Secondary | ICD-10-CM | POA: Diagnosis not present

## 2013-05-16 LAB — BASIC METABOLIC PANEL
BUN: 23 mg/dL (ref 6–23)
CO2: 24 mEq/L (ref 19–32)
Calcium: 8.3 mg/dL — ABNORMAL LOW (ref 8.4–10.5)
Chloride: 110 mEq/L (ref 96–112)
Creatinine, Ser: 0.78 mg/dL (ref 0.50–1.10)
GFR calc Af Amer: 85 mL/min — ABNORMAL LOW (ref >90)
GFR calc non Af Amer: 74 mL/min — ABNORMAL LOW (ref >90)
Glucose, Bld: 94 mg/dL (ref 70–99)
Potassium: 3.5 mEq/L — ABNORMAL LOW (ref 3.7–5.3)
Sodium: 144 mEq/L (ref 137–147)

## 2013-05-16 LAB — URINALYSIS, ROUTINE W REFLEX MICROSCOPIC
Bilirubin Urine: NEGATIVE
Glucose, UA: NEGATIVE mg/dL
Ketones, ur: 15 mg/dL — AB
Leukocytes, UA: NEGATIVE
Nitrite: NEGATIVE
Protein, ur: NEGATIVE mg/dL
Specific Gravity, Urine: 1.025 (ref 1.005–1.030)
Urobilinogen, UA: 1 mg/dL (ref 0.0–1.0)
pH: 5.5 (ref 5.0–8.0)

## 2013-05-16 LAB — URINE MICROSCOPIC-ADD ON

## 2013-05-16 LAB — CBC
HCT: 27 % — ABNORMAL LOW (ref 36.0–46.0)
Hemoglobin: 8.9 g/dL — ABNORMAL LOW (ref 12.0–15.0)
MCH: 34.1 pg — ABNORMAL HIGH (ref 26.0–34.0)
MCHC: 33 g/dL (ref 30.0–36.0)
MCV: 103.4 fL — ABNORMAL HIGH (ref 78.0–100.0)
Platelets: 211 10*3/uL (ref 150–400)
RBC: 2.61 MIL/uL — ABNORMAL LOW (ref 3.87–5.11)
RDW: 20.6 % — ABNORMAL HIGH (ref 11.5–15.5)
WBC: 4 10*3/uL (ref 4.0–10.5)

## 2013-05-16 LAB — LIPASE, BLOOD: Lipase: 11 U/L (ref 11–59)

## 2013-05-16 LAB — TSH: TSH: 0.03 u[IU]/mL — ABNORMAL LOW (ref 0.350–4.500)

## 2013-05-16 MED ORDER — ONDANSETRON HCL 4 MG/2ML IJ SOLN: 4.0000 mg | Freq: Three times a day (TID) | INTRAMUSCULAR | Status: DC | PRN

## 2013-05-16 MED ORDER — ZOLPIDEM TARTRATE 5 MG PO TABS
5.0000 mg | ORAL_TABLET | Freq: Every evening | ORAL | Status: DC | PRN
  Administered 2013-05-16 (×2): 5 mg via ORAL
  Filled 2013-05-16 (×2): qty 1

## 2013-05-16 NOTE — Progress Notes (Addendum)
[facility] PROGRESS NOTE  Brittney Gates SKA:[id number] DOB: Mar 12, 1925 DOA: 05/15/2013 PCP: Gwendolyn Grant, MD  Assessment/Plan: 80 y.o. female with a history of HTN, PAD, Hyperlipidemia, and Hypothyroidism who presented to the [facility] ED 05/15/2013 with acute onset mid abdomen pain admitted with pancreatitis   1. Mild acute pancreatitis; ? Passed gallstone; [place]: Mild gallbladder distention without evidence of stones/sludge or wall thickening; CT abd: There is no evidence of obstructive or inflammatory abnormalities -symptoms revolved; cont gentle IVF, antiemetics, start diet as tolerated; may need EUS/ERCP outpatient  2. Nausea, vomiting, abd pain likely due to mild pancratitis; resolved; cont as above   3. HTN cont home meds;   4. Elevated temperature, with mild leukocytosis; likely due to #1; check UA  5. Anemia chronic; AOCD: start PO iron; recommended outpatient follow up; colonoscopy   6. Hypothyroidism, TSH 0.02 (11/2012); recheck TSH   Code Status: full Family Communication: d/w patient updated Fleeman,John Son [phone] ; called update  (indicate person spoken with, relationship, and if by phone, the number) Disposition Plan: home 24-48 hours    Consultants:  None   Procedures:  None   Antibiotics:  None  (indicate start date, and stop date if known)  HPI/Subjective: alert  Objective: Filed Vitals:   05/16/13 0619  BP: 133/58  Pulse: 73  Temp: 97.9 F (36.6 C)  Resp: 18    Intake/Output Summary (Last 24 hours) at 05/16/13 0941 Last data filed at 05/16/13 3212  Gross per 24 hour  Intake    480 ml  Output      0 ml  Net    480 ml   There were no vitals filed for this visit.  Exam:   General:  alert  Cardiovascular: s1,s2 rrr  Respiratory: CTA BL  Abdomen: soft, nt, nd   Musculoskeletal: no Le edema   Data Reviewed: Basic Metabolic Panel:  Recent Labs Lab 05/15/13 0309 05/16/13 0844  NA 144 144  K 3.9 3.5*  CL 105  110  CO2 24 24  GLUCOSE 142* 94  BUN 23 23  CREATININE 0.91 0.78  CALCIUM 9.0 8.3*   Liver Function Tests:  Recent Labs Lab 05/15/13 0309  AST 18  ALT 15  ALKPHOS 72  BILITOT 0.7  PROT 7.1  ALBUMIN 3.9    Recent Labs Lab 05/15/13 0309 05/16/13 0844  LIPASE 93* 11   No results found for this basename: AMMONIA,  in the last 168 hours CBC:  Recent Labs Lab 05/15/13 0309 05/16/13 0844  WBC 13.9* 4.0  NEUTROABS 12.6*  --   HGB 10.6* 8.9*  HCT 31.8* 27.0*  MCV 101.0* 103.4*  PLT 267 211   Cardiac Enzymes:  Recent Labs Lab 05/15/13 0309  TROPONINI <0.30   BNP (last 3 results) No results found for this basename: PROBNP,  in the last 8760 hours CBG: No results found for this basename: GLUCAP,  in the last 168 hours  No results found for this or any previous visit (from the past 240 hour(s)).   Studies: US Abdomen Complete  05/15/2013   CLINICAL DATA:  Abdominal pain/pancreatitis.  EXAM: ULTRASOUND ABDOMEN COMPLETE  COMPARISON:  04/08/2007  FINDINGS: Gallbladder:  Gallbladder is mildly distended without evidence of gallstones, sludge or wall thickening. No pericholecystic fluid. Negative sonographic Murphy's sign.  Common bile duct:  Diameter: 8.3 mm.  No evidence of ductal stones.  Liver:  No focal lesion identified. Within normal limits in parenchymal echogenicity.  IVC:  No abnormality visualized.  Pancreas:  Visualized portion unremarkable. Borderline enlargement of the pancreatic duct measuring 3.3 mm.  Spleen:  Size and appearance within normal limits.  Right Kidney:  Length: 10.1 cm. Echogenicity within normal limits. No mass or hydronephrosis visualized.  Left Kidney:  Length: 9.4 cm. Echogenicity within normal limits. No mass or hydronephrosis visualized.  Abdominal aorta:  No aneurysm visualized.  Other findings:  None.  IMPRESSION: Mild gallbladder distention without evidence of stones/sludge or wall thickening. Minimal prominence of the common bowel duct and  main pancreatic duct. No evidence of ductal stones. Consider CT to exclude pancreatic head/ ampullary mass.   Electronically Signed   By: Marin Olp M.D.   On: 05/15/2013 07:06   Ct Abd Wo & W Cm  05/15/2013   CLINICAL DATA:  possible pancreatic mass  EXAM: CT ABDOMEN WITHOUT AND WITH CONTRAST  TECHNIQUE: Multidetector CT imaging of the abdomen was performed following the standard protocol before and following the bolus administration of intravenous contrast.  CONTRAST:  120mL OMNIPAQUE IOHEXOL 300 MG/ML  SOLN  COMPARISON:  None.  FINDINGS: Mild hypoventilation is identified within the lung bases, also minimal pleural thickening in the left lung base.  The liver, spleen, adrenals, right kidney are unremarkable. 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 A small extrarenal pelvis identified on the left. Left kidney is otherwise unremarkable.  The pancreas is unremarkable. There is prominence of the common bile duct likely secondary to patient's age.  There is no evidence of bowel obstruction, enteritis, colitis or diverticulitis. Instruments made of a duodenum diverticulum along the second portion of the duodenum.  There is no evidence of an abdominal aortic aneurysm. Atherosclerotic calcifications are appreciated within the abdominal aorta. The celiac, SMA, IMA, SMV are opacified. There is no evidence of abdominal masses, free fluid, loculated fluid collections, or adenopathy.  Evaluation of the osseous structures demonstrates no evidence of aggressive appearing osseous lesions, multilevel spondylosis within the spine. There is no evidence of abdominal wall hernia.  IMPRESSION: There is no evidence of obstructive or inflammatory abnormalities. 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 No further focal acute abnormalities appreciated. Specifically there  is no CT evidence of pancreatic masses.  Atherosclerotic calcification identified within the aorta.   Electronically Signed   By: Margaree Mackintosh M.D.   On: 05/15/2013 13:54    Scheduled Meds: . amLODipine  5 mg Oral Daily  . benazepril  20 mg Oral Daily  . calcium carbonate  1,250 mg Oral BID WC  . cholecalciferol  2,000 Units Oral Daily  . enoxaparin (LOVENOX) injection  40 mg Subcutaneous Q24H  . levothyroxine  125 mcg Oral QAC breakfast  . multivitamin with minerals  1 tablet Oral Daily   Continuous Infusions: . sodium chloride 75 mL/hr (05/15/13 2245)    Principal Problem:   Acute pancreatitis Active Problems:   HYPOTHYROIDISM   HYPERLIPIDEMIA   HYPERTENSION   ANEMIA   DIABETES MELLITUS, BORDERLINE   Abnormal CT scan, chest   Abdominal pain, right upper quadrant   Leukocytosis, unspecified    Time spent: >35 minutes     Kinnie Feil  [facility] Pager [phone]. If 7PM-7AM, please contact night-coverage at [email or web address], password [facility] 05/16/2013, 9:41 AM  LOS: 1 day

## 2013-05-17 DIAGNOSIS — E039 Hypothyroidism, unspecified: Secondary | ICD-10-CM | POA: Diagnosis not present

## 2013-05-17 DIAGNOSIS — K859 Acute pancreatitis without necrosis or infection, unspecified: Secondary | ICD-10-CM | POA: Diagnosis not present

## 2013-05-17 DIAGNOSIS — D649 Anemia, unspecified: Secondary | ICD-10-CM | POA: Diagnosis not present

## 2013-05-17 DIAGNOSIS — R1011 Right upper quadrant pain: Secondary | ICD-10-CM | POA: Diagnosis not present

## 2013-05-17 LAB — LIPASE, BLOOD: Lipase: 10 U/L — ABNORMAL LOW (ref 11–59)

## 2013-05-17 MED ORDER — LEVOTHYROXINE SODIUM 112 MCG PO TABS: 112.0000 ug | ORAL_TABLET | Freq: Every day | ORAL | Status: DC

## 2013-05-17 NOTE — Discharge Summary (Signed)
Physician Discharge Summary  Brittney Gates HGD:[id number] DOB: 08/09/1924 DOA: 05/15/2013  PCP: Gwendolyn Grant, MD  Admit date: 05/15/2013 Discharge date: 05/17/2013  Time spent: >35 minutes  Recommendations for Outpatient Follow-up:  F/u with PCP in 1-2 weeks F/u with gastroenterologist in 2-3 weeks  Discharge Diagnoses:  Principal Problem:   Acute pancreatitis Active Problems:   HYPOTHYROIDISM   HYPERLIPIDEMIA   HYPERTENSION   ANEMIA   DIABETES MELLITUS, BORDERLINE   Abnormal CT scan, chest   Abdominal pain, right upper quadrant   Leukocytosis, unspecified   Discharge Condition: stable   Diet recommendation: heart healthy   Filed Weights   05/16/13 1900  Weight: 72.122 kg (159 lb)    History of present illness:  80 y.o. female with a history of HTN, PAD, Hyperlipidemia, and Hypothyroidism who presented to the [facility] ED 05/15/2013 with acute onset mid abdomen pain admitted with pancreatitis   Hospital Course:  1. Mild acute pancreatitis; ? Passed gallstone vs viral (family had similar n/v/d);  -[place]: Mild gallbladder distention without evidence of stones/sludge or wall thickening; CT abd: There is no evidence of obstructive or inflammatory abnormalities  -symptoms revolved on supportive care; with IVF, antiemetics, -recommended outpatient follow up with gastroenterologist   2. Nausea, vomiting, abd pain likely due to mild pancratitis; resolved;  3. HTN cont home meds;  4. Elevated temperature, with mild leukocytosis; likely due to #1; UA unremarkable; fever resolved   5. Anemia chronic; AOCD: start PO iron; recommended outpatient follow up; colonoscopy  6. Hypothyroidism, TSH 0.02 (11/2012); recheck TSH-0.03; decreased levothyroxine to 112 mcg; recheck TSH in 4-6 weeks    Procedures:  None  (i.e. Studies not automatically included, echos, thoracentesis, etc; not x-rays)  Consultations:  None   Discharge Exam: Filed Vitals:   05/17/13 0538  BP: 136/75  Pulse: 78   Temp: 98.6 F (37 C)  Resp: 18    General: alert Cardiovascular: s1,s2 rrr Respiratory: CTA BL  Discharge Instructions  Discharge Orders   Future Appointments Provider Department Dept Phone   06/24/2013 3:00 PM Sherren Mocha, MD [place] [facility] [phone]   Future Orders Complete By Expires   Diet - low sodium heart healthy  As directed    Discharge instructions  As directed    Comments:     Please follow up with Primary care doctor in 1-2 weeks   Increase activity slowly  As directed        Medication List         amLODipine-benazepril 5-20 MG per capsule  Commonly known as:  LOTREL  Take 1 capsule by mouth daily.     aspirin EC 81 MG tablet  Take 81 mg by mouth daily.     calcium carbonate 600 MG Tabs tablet  Commonly known as:  OS-CAL  Take 600 mg by mouth 2 (two) times daily with a meal.     Cholecalciferol 2000 UNITS Tabs  Take 2,000 Units by mouth daily.     levothyroxine 112 MCG tablet  Commonly known as:  SYNTHROID  Take 1 tablet (112 mcg total) by mouth daily before breakfast.     methocarbamol 500 MG tablet  Commonly known as:  ROBAXIN  Take 500 mg by mouth every 8 (eight) hours as needed for muscle spasms.     multivitamin with minerals Tabs tablet  Take 1 tablet by mouth daily.     SUPER B COMPLEX PO  Take 1 tablet by mouth daily.     zolpidem  10 MG tablet  Commonly known as:  AMBIEN  Take 10 mg by mouth at bedtime.       Allergies  Allergen Reactions  . Statins Other (See Comments)    Leg weakness       Follow-up Information   Follow up with Gwendolyn Grant, MD In 2 weeks.   Specialty:  Internal Medicine   Contact information:   [address] [place] [address] [phone]        The results of significant diagnostics from this hospitalization (including imaging, microbiology, ancillary and laboratory) are listed below for reference.    Significant Diagnostic  Studies: Dg Chest 2 View  04/20/2013   CLINICAL DATA:  Cough.  EXAM: CHEST  2 VIEW  COMPARISON:  CT chest 11/11/2012.  Chest x-ray 01/20/2012.  FINDINGS: Mediastinum and hilar structures are normal. Hyperexpansion of both lungs present consistent with COPD. No focal infiltrate identified. Stable cardiomegaly. No pulmonary venous congestion. No pleural effusion or pneumothorax. Pleural parenchymal scarring. No acute bony abnormality. Degenerative changes thoracic spine.  IMPRESSION: 1. COPD. No acute cardiopulmonary disease, chest stable from 01/20/2012.  2. Stable cardiomegaly.   Electronically Signed   By: [place]   On: 04/20/2013 17:18   US Abdomen Complete  05/15/2013   CLINICAL DATA:  Abdominal pain/pancreatitis.  EXAM: ULTRASOUND ABDOMEN COMPLETE  COMPARISON:  04/08/2007  FINDINGS: Gallbladder:  Gallbladder is mildly distended without evidence of gallstones, sludge or wall thickening. No pericholecystic fluid. Negative sonographic Murphy's sign.  Common bile duct:  Diameter: 8.3 mm.  No evidence of ductal stones.  Liver:  No focal lesion identified. Within normal limits in parenchymal echogenicity.  IVC:  No abnormality visualized.  Pancreas:  Visualized portion unremarkable. Borderline enlargement of the pancreatic duct measuring 3.3 mm.  Spleen:  Size and appearance within normal limits.  Right Kidney:  Length: 10.1 cm. Echogenicity within normal limits. No mass or hydronephrosis visualized.  Left Kidney:  Length: 9.4 cm. Echogenicity within normal limits. No mass or hydronephrosis visualized.  Abdominal aorta:  No aneurysm visualized.  Other findings:  None.  IMPRESSION: Mild gallbladder distention without evidence of stones/sludge or wall thickening. Minimal prominence of the common bowel duct and main pancreatic duct. No evidence of ductal stones. Consider CT to exclude pancreatic head/ ampullary mass.   Electronically Signed   By: Marin Olp M.D.   On: 05/15/2013 07:06   Ct Abd Wo & W  Cm  05/15/2013   CLINICAL DATA:  possible pancreatic mass  EXAM: CT ABDOMEN WITHOUT AND WITH CONTRAST  TECHNIQUE: Multidetector CT imaging of the abdomen was performed following the standard protocol before and following the bolus administration of intravenous contrast.  CONTRAST:  120mL OMNIPAQUE IOHEXOL 300 MG/ML  SOLN  COMPARISON:  None.  FINDINGS: Mild hypoventilation is identified within the lung bases, also minimal pleural thickening in the left lung base.  The liver, spleen, adrenals, right kidney are unremarkable. 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 A small extrarenal pelvis identified on the left. Left kidney is otherwise unremarkable.  The pancreas is unremarkable. There is prominence of the common bile duct likely secondary to patient's age.  There is no evidence of bowel obstruction, enteritis, colitis or diverticulitis. Instruments made of a duodenum diverticulum along the second portion of the duodenum.  There is no evidence of an abdominal aortic aneurysm. Atherosclerotic calcifications are  appreciated within the abdominal aorta. The celiac, SMA, IMA, SMV are opacified. There is no evidence of abdominal masses, free fluid, loculated fluid collections, or adenopathy.  Evaluation of the osseous structures demonstrates no evidence of aggressive appearing osseous lesions, multilevel spondylosis within the spine. There is no evidence of abdominal wall hernia.  IMPRESSION: There is no evidence of obstructive or inflammatory abnormalities. 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 No further focal acute abnormalities appreciated. Specifically there is no CT evidence of pancreatic masses.  Atherosclerotic calcification identified within the aorta.   Electronically Signed   By: Margaree Mackintosh M.D.   On: 05/15/2013 13:54    Microbiology: No  results found for this or any previous visit (from the past 240 hour(s)).   Labs: Basic Metabolic Panel:  Recent Labs Lab 05/15/13 0309 05/16/13 0844  NA 144 144  K 3.9 3.5*  CL 105 110  CO2 24 24  GLUCOSE 142* 94  BUN 23 23  CREATININE 0.91 0.78  CALCIUM 9.0 8.3*   Liver Function Tests:  Recent Labs Lab 05/15/13 0309  AST 18  ALT 15  ALKPHOS 72  BILITOT 0.7  PROT 7.1  ALBUMIN 3.9    Recent Labs Lab 05/15/13 0309 05/16/13 0844 05/17/13 0340  LIPASE 93* 11 10*   No results found for this basename: AMMONIA,  in the last 168 hours CBC:  Recent Labs Lab 05/15/13 0309 05/16/13 0844  WBC 13.9* 4.0  NEUTROABS 12.6*  --   HGB 10.6* 8.9*  HCT 31.8* 27.0*  MCV 101.0* 103.4*  PLT 267 211   Cardiac Enzymes:  Recent Labs Lab 05/15/13 0309  TROPONINI <0.30   BNP: BNP (last 3 results) No results found for this basename: PROBNP,  in the last 8760 hours CBG: No results found for this basename: GLUCAP,  in the last 168 hours     Signed:  Rowe Clack N  [facility] 05/17/2013, 9:05 AM

## 2013-05-20 ENCOUNTER — Encounter: Payer: Self-pay | Admitting: *Deleted

## 2013-05-20 ENCOUNTER — Ambulatory Visit: Payer: Medicare Other | Admitting: Internal Medicine

## 2013-05-25 ENCOUNTER — Ambulatory Visit: Payer: Medicare Other | Admitting: Internal Medicine

## 2013-06-03 ENCOUNTER — Other Ambulatory Visit: Payer: Self-pay | Admitting: *Deleted

## 2013-06-03 MED ORDER — NAPROXEN 500 MG PO TABS: 500.0000 mg | ORAL_TABLET | Freq: Two times a day (BID) | ORAL | Status: DC

## 2013-06-20 ENCOUNTER — Ambulatory Visit: Payer: Medicare Other | Admitting: Internal Medicine

## 2013-06-20 DIAGNOSIS — Z0289 Encounter for other administrative examinations: Secondary | ICD-10-CM

## 2013-06-24 ENCOUNTER — Ambulatory Visit: Payer: Medicare Other | Admitting: Cardiovascular Disease

## 2013-07-01 ENCOUNTER — Encounter: Payer: Self-pay | Admitting: Internal Medicine

## 2013-07-01 ENCOUNTER — Telehealth: Payer: Self-pay | Admitting: Cardiovascular Disease

## 2013-07-01 NOTE — Telephone Encounter (Signed)
Patient called re stmt received, thinking it was for a cancelled appointment w/Dr. Burt Knack.  Advised her we did not show any charge for the date in question (March 27-she had cancelled appt 2 weeks prior).  Advised her that there was a no show charge for March 23 with Dr. Asa Lente and that she should contact their office regarding it.  Pt understood.

## 2013-07-04 ENCOUNTER — Other Ambulatory Visit: Payer: Self-pay | Admitting: *Deleted

## 2013-07-05 ENCOUNTER — Ambulatory Visit: Payer: Medicare Other | Admitting: Internal Medicine

## 2013-07-05 MED ORDER — ZOLPIDEM TARTRATE 10 MG PO TABS: 10.0000 mg | ORAL_TABLET | Freq: Every day | ORAL | Status: DC

## 2013-07-05 NOTE — Telephone Encounter (Signed)
Faxed script back to [facility].../lmb 

## 2013-07-06 ENCOUNTER — Telehealth: Payer: Self-pay | Admitting: Internal Medicine

## 2013-07-06 NOTE — Telephone Encounter (Signed)
Message copied by Oliva Bustard on Wed Jul 06, 2013  8:11 AM ------      Message from: Larina Bras      Created: Wed Jul 06, 2013  8:01 AM                   ----- Message -----         From: Lafayette Dragon, MD         Sent: 07/05/2013   5:11 PM           To: Larina Bras, CMA            No charge      ----- Message -----         From: Larina Bras, CMA         Sent: 07/05/2013   3:18 PM           To: Lafayette Dragon, MD            Patient no showed appointment with Dr Olevia Perches on 07/05/13. Dr Olevia Perches, do you want to charge no show fee?       ------

## 2013-08-11 DIAGNOSIS — H04129 Dry eye syndrome of unspecified lacrimal gland: Secondary | ICD-10-CM | POA: Diagnosis not present

## 2013-09-29 DIAGNOSIS — H43819 Vitreous degeneration, unspecified eye: Secondary | ICD-10-CM | POA: Diagnosis not present

## 2013-10-20 ENCOUNTER — Other Ambulatory Visit: Payer: Self-pay | Admitting: *Deleted

## 2013-10-20 ENCOUNTER — Other Ambulatory Visit: Payer: Self-pay

## 2013-10-20 MED ORDER — LEVOTHYROXINE SODIUM 125 MCG PO TABS: 125.0000 ug | ORAL_TABLET | Freq: Every day | ORAL | Status: DC

## 2013-10-20 MED ORDER — LEVOTHYROXINE SODIUM 112 MCG PO TABS: 112.0000 ug | ORAL_TABLET | Freq: Every day | ORAL | Status: DC

## 2013-10-20 NOTE — Telephone Encounter (Signed)
rx request for levothyroxine 0.125mg  tab

## 2013-11-10 DIAGNOSIS — H35369 Drusen (degenerative) of macula, unspecified eye: Secondary | ICD-10-CM | POA: Diagnosis not present

## 2013-11-10 DIAGNOSIS — H43819 Vitreous degeneration, unspecified eye: Secondary | ICD-10-CM | POA: Diagnosis not present

## 2013-11-21 ENCOUNTER — Other Ambulatory Visit: Payer: Self-pay

## 2013-11-21 MED ORDER — AMLODIPINE BESY-BENAZEPRIL HCL 5-20 MG PO CAPS: 1.0000 | ORAL_CAPSULE | Freq: Every day | ORAL | Status: DC

## 2013-11-25 DIAGNOSIS — H3560 Retinal hemorrhage, unspecified eye: Secondary | ICD-10-CM | POA: Diagnosis not present

## 2013-11-25 DIAGNOSIS — H353190 Nonexudative age-related macular degeneration, unspecified eye, stage unspecified: Secondary | ICD-10-CM | POA: Diagnosis not present

## 2013-11-25 DIAGNOSIS — H2510 Age-related nuclear cataract, unspecified eye: Secondary | ICD-10-CM | POA: Diagnosis not present

## 2013-12-12 DIAGNOSIS — Z23 Encounter for immunization: Secondary | ICD-10-CM | POA: Diagnosis not present

## 2013-12-21 ENCOUNTER — Other Ambulatory Visit: Payer: Self-pay | Admitting: Internal Medicine

## 2013-12-21 NOTE — Telephone Encounter (Signed)
Faxed script back to [facility].../lmb 

## 2013-12-22 ENCOUNTER — Telehealth: Payer: Self-pay

## 2013-12-22 NOTE — Telephone Encounter (Signed)
Needs OV and assured tox test (any provider) before refill can be done

## 2013-12-22 NOTE — Telephone Encounter (Signed)
Gave pt MD response and appointment has been made for next Friday with Dr. [name].

## 2013-12-30 ENCOUNTER — Ambulatory Visit (INDEPENDENT_AMBULATORY_CARE_PROVIDER_SITE_OTHER): Payer: Medicare Other | Admitting: Internal Medicine

## 2013-12-30 ENCOUNTER — Encounter: Payer: Self-pay | Admitting: Internal Medicine

## 2013-12-30 ENCOUNTER — Other Ambulatory Visit (INDEPENDENT_AMBULATORY_CARE_PROVIDER_SITE_OTHER): Payer: Medicare Other

## 2013-12-30 ENCOUNTER — Ambulatory Visit: Payer: Medicare Other | Admitting: Internal Medicine

## 2013-12-30 VITALS — BP 122/80 | HR 81 | Temp 97.6°F | Ht 68.0 in | Wt 158.5 lb

## 2013-12-30 DIAGNOSIS — Z23 Encounter for immunization: Secondary | ICD-10-CM

## 2013-12-30 DIAGNOSIS — E039 Hypothyroidism, unspecified: Secondary | ICD-10-CM

## 2013-12-30 DIAGNOSIS — I10 Essential (primary) hypertension: Secondary | ICD-10-CM | POA: Diagnosis not present

## 2013-12-30 DIAGNOSIS — E785 Hyperlipidemia, unspecified: Secondary | ICD-10-CM | POA: Diagnosis not present

## 2013-12-30 DIAGNOSIS — R7303 Prediabetes: Secondary | ICD-10-CM

## 2013-12-30 DIAGNOSIS — R7309 Other abnormal glucose: Secondary | ICD-10-CM

## 2013-12-30 DIAGNOSIS — E559 Vitamin D deficiency, unspecified: Secondary | ICD-10-CM

## 2013-12-30 LAB — HEPATIC FUNCTION PANEL
ALT: 14 U/L (ref 0–35)
AST: 19 U/L (ref 0–37)
Albumin: 4.4 g/dL (ref 3.5–5.2)
Alkaline Phosphatase: 63 U/L (ref 39–117)
Bilirubin, Direct: 0.1 mg/dL (ref 0.0–0.3)
Total Bilirubin: 0.7 mg/dL (ref 0.2–1.2)
Total Protein: 7.7 g/dL (ref 6.0–8.3)

## 2013-12-30 LAB — LIPID PANEL
Cholesterol: 206 mg/dL — ABNORMAL HIGH (ref 0–200)
HDL: 52.1 mg/dL (ref >39.00)
LDL Cholesterol: 131 mg/dL — ABNORMAL HIGH (ref 0–99)
NonHDL: 153.9
Total CHOL/HDL Ratio: 4
Triglycerides: 114 mg/dL (ref 0.0–149.0)
VLDL: 22.8 mg/dL (ref 0.0–40.0)

## 2013-12-30 LAB — BASIC METABOLIC PANEL
BUN: 20 mg/dL (ref 6–23)
CO2: 24 mEq/L (ref 19–32)
Calcium: 9.6 mg/dL (ref 8.4–10.5)
Chloride: 104 mEq/L (ref 96–112)
Creatinine, Ser: 0.8 mg/dL (ref 0.4–1.2)
GFR: 69.07 mL/min (ref >60.00)
Glucose, Bld: 96 mg/dL (ref 70–99)
Potassium: 4.2 mEq/L (ref 3.5–5.1)
Sodium: 139 mEq/L (ref 135–145)

## 2013-12-30 LAB — URINALYSIS, ROUTINE W REFLEX MICROSCOPIC
Bilirubin Urine: NEGATIVE
Ketones, ur: NEGATIVE
Nitrite: NEGATIVE
RBC / HPF: NONE SEEN (ref 0–?)
Specific Gravity, Urine: <=1.005 — AB (ref 1.000–1.030)
Total Protein, Urine: NEGATIVE
Urine Glucose: NEGATIVE
Urobilinogen, UA: 0.2 (ref 0.0–1.0)
pH: 6 (ref 5.0–8.0)

## 2013-12-30 LAB — CBC WITH DIFFERENTIAL/PLATELET
Basophils Absolute: 0.1 10*3/uL (ref 0.0–0.1)
Basophils Relative: 1 % (ref 0.0–3.0)
Eosinophils Absolute: 0.2 10*3/uL (ref 0.0–0.7)
Eosinophils Relative: 2.3 % (ref 0.0–5.0)
HCT: 32.5 % — ABNORMAL LOW (ref 36.0–46.0)
Hemoglobin: 10.6 g/dL — ABNORMAL LOW (ref 12.0–15.0)
Lymphocytes Relative: 39.1 % (ref 12.0–46.0)
Lymphs Abs: 3.7 10*3/uL (ref 0.7–4.0)
MCHC: 32.6 g/dL (ref 30.0–36.0)
MCV: 104.6 fl — ABNORMAL HIGH (ref 78.0–100.0)
Monocytes Absolute: 0.4 10*3/uL (ref 0.1–1.0)
Monocytes Relative: 4 % (ref 3.0–12.0)
Neutro Abs: 5.1 10*3/uL (ref 1.4–7.7)
Neutrophils Relative %: 53.6 % (ref 43.0–77.0)
Platelets: 270 10*3/uL (ref 150.0–400.0)
RBC: 3.11 Mil/uL — ABNORMAL LOW (ref 3.87–5.11)
RDW: 23.8 % — ABNORMAL HIGH (ref 11.5–15.5)
WBC: 9.6 10*3/uL (ref 4.0–10.5)

## 2013-12-30 LAB — T4, FREE: Free T4: 1.48 ng/dL (ref 0.60–1.60)

## 2013-12-30 LAB — TSH: TSH: 0.04 u[IU]/mL — ABNORMAL LOW (ref 0.35–4.50)

## 2013-12-30 MED ORDER — ZOLPIDEM TARTRATE 10 MG PO TABS: ORAL_TABLET | ORAL | Status: DC

## 2013-12-30 MED ORDER — NAPROXEN 500 MG PO TABS: 500.0000 mg | ORAL_TABLET | Freq: Two times a day (BID) | ORAL | Status: DC

## 2013-12-30 MED ORDER — AMLODIPINE BESY-BENAZEPRIL HCL 5-20 MG PO CAPS
1.0000 | ORAL_CAPSULE | Freq: Every day | ORAL | Status: DC
Start: 2013-12-30 — End: 2014-10-30

## 2013-12-30 MED ORDER — LEVOTHYROXINE SODIUM 125 MCG PO TABS: 125.0000 ug | ORAL_TABLET | Freq: Every day | ORAL | Status: DC

## 2013-12-30 NOTE — Progress Notes (Signed)
Subjective:    Patient ID: Brittney Gates, female    DOB: [birth year], 80 y.o.   MRN: [id number]  HPI  Here for yearly f/u with me due to Dr Asa Lente currently not available;  Overall doing ok;  Pt denies CP, worsening SOB, DOE, wheezing, orthopnea, PND, worsening LE edema, palpitations, dizziness or syncope.  Pt denies neurological change such as new headache, facial or extremity weakness.  Pt denies polydipsia, polyuria, or low sugar symptoms. Pt states overall good compliance with treatment and medications, good tolerability, and has been trying to follow lower cholesterol diet.  Pt denies worsening depressive symptoms, suicidal ideation or panic. No fever, night sweats, wt loss, loss of appetite, or other constitutional symptoms.  Pt states good ability with ADL's, has low fall risk, walks with cane for safety only,  home safety reviewed and adequate, no other significant changes in hearing or vision, and occasionally active with exercise.  Due for immunizations, labs, refills  No current complaints, Walks with cane for safety Past Medical History  Diagnosis Date  . LOW BACK PAIN, CHRONIC   . Diverticulosis   . ANEMIA   . ANXIETY   . Arthritis   . HYPERTENSION   . HYPERLIPIDEMIA   . HYPOTHYROIDISM   . PAD (peripheral artery disease)   . Hiatal hernia   . Gastritis   . Hemorrhoids   . Acute pancreatitis   . Chronic anemia    Past Surgical History  Procedure Laterality Date  . Back surgery      x's 2  . Hemorrhoid surgery    . Angioplasty  1995  . Tubal ligation    . Breast surgery      Benign  . Appendectomy    . Femur im nail  01/21/2012    Procedure: INTRAMEDULLARY (IM) NAIL FEMORAL;  Surgeon: Newt Minion, MD;  Location: [facility];  Service: Orthopedics;  Laterality: Left;  Intertrochanteric Nail Left Hip    reports that she has never smoked. She does not have any smokeless tobacco history on file. She reports that she does not drink alcohol or use illicit drugs. family history  includes Gallbladder disease in her daughter and son; Lung cancer in her father. There is no history of Colon cancer. Allergies  Allergen Reactions  . Statins Other (See Comments)    Leg weakness   Current Outpatient Prescriptions on File Prior to Visit  Medication Sig Dispense Refill  . aspirin EC 81 MG tablet Take 81 mg by mouth daily.      . B Complex-C (SUPER B COMPLEX PO) Take 1 tablet by mouth daily.      . calcium carbonate (OS-CAL) 600 MG TABS Take 600 mg by mouth 2 (two) times daily with a meal.      . Cholecalciferol 2000 UNITS TABS Take 2,000 Units by mouth daily.      . Multiple Vitamin (MULTIVITAMIN WITH MINERALS) TABS tablet Take 1 tablet by mouth daily.       No current facility-administered medications on file prior to visit.   Review of Systems  Constitutional: Negative for increased diaphoresis, other activity, appetite or other siginficant weight change  HENT: Negative for worsening hearing loss, ear pain, facial swelling, mouth sores and neck stiffness.   Eyes: Negative for other worsening pain, redness or visual disturbance.  Respiratory: Negative for shortness of breath and wheezing.   Cardiovascular: Negative for chest pain and palpitations.  Gastrointestinal: Negative for diarrhea, blood in stool, abdominal distention or other  pain Genitourinary: Negative for hematuria, flank pain or change in urine volume.  Musculoskeletal: Negative for myalgias or other joint complaints.  Skin: Negative for color change and wound.  Neurological: Negative for syncope and numbness. other than noted Hematological: Negative for adenopathy. or other swelling Psychiatric/Behavioral: Negative for hallucinations, self-injury, decreased concentration or other worsening agitation.      Objective:   Physical Exam BP 122/80  Pulse 81  Temp(Src) 97.6 F (36.4 C) (Oral)  Ht 5\' 8"  (1.727 m)  Wt 158 lb 8 oz (71.895 kg)  BMI 24.11 kg/m2  SpO2 99% VS noted,  Constitutional: Pt is  oriented to person,  Head: Normocephalic and atraumatic.  Right Ear: External ear normal.  Left Ear: External ear normal.  Nose: Nose normal.  Mouth/Throat: Oropharynx is clear and moist.  Eyes: Conjunctivae and EOM are normal. Pupils are equal, round, and reactive to light.  Neck: Normal range of motion. Neck supple. No JVD present. No tracheal deviation present.  Cardiovascular: Normal rate, regular rhythm, normal heart sounds and intact distal pulses.   Pulmonary/Chest: Effort normal and breath sounds without rales or wheezing  Abdominal: Soft. Bowel sounds are normal. NT. No HSM  Musculoskeletal: Normal range of motion. Exhibits no edema.  Lymphadenopathy:  Has no cervical adenopathy.  Neurological: Pt is alert and oriented to person, place, and time. Pt has normal reflexes. No cranial nerve deficit. Motor grossly intact Skin: Skin is warm and dry. No rash noted.  Psychiatric:  Has normal mood and affect. Behavior is normal.     Assessment & Plan:

## 2013-12-30 NOTE — Patient Instructions (Addendum)
You had the new Prevnar pneumonia shot, and the tetanus (Td) shot today  Please continue all other medications as before, and refills have been done if requested, including the ambien  Please have the pharmacy call with any other refills you may need.  Please continue your efforts at being more active, low cholesterol diet, and weight control.  You are otherwise up to date with prevention measures today.  Please keep your appointments with your specialists as you may have planned  Please go to the LAB in the Basement (turn left off the elevator) for the tests to be done today  You will be contacted by phone if any changes need to be made immediately.  Otherwise, you will receive a letter about your results with an explanation, but please check with MyChart first.  Please remember to sign up for MyChart if you have not done so, as this will be important to you in the future with finding out test results, communicating by private email, and scheduling acute appointments online when needed.  Please return in 6 months, or sooner if needed

## 2013-12-30 NOTE — Progress Notes (Signed)
Pre visit review using our clinic review tool, if applicable. No additional management support is needed unless otherwise documented below in the visit note. 

## 2014-01-01 NOTE — Assessment & Plan Note (Signed)
stable overall by history and exam, and pt to continue medical treatment as before,  to f/u any worsening symptoms or concerns, for f/u lab 

## 2014-01-01 NOTE — Assessment & Plan Note (Signed)
stable overall by history and exam,  and pt to continue medical treatment as before,  to f/u any worsening symptoms or concerns, for f/u lab, cont low chol diet

## 2014-01-01 NOTE — Assessment & Plan Note (Signed)
stable overall by history and exam, recent data reviewed with pt, and pt to continue medical treatment as before,  to f/u any worsening symptoms or concerns Lab Results  Component Value Date   HGBA1C 6.0 03/29/2010

## 2014-01-01 NOTE — Assessment & Plan Note (Signed)
stable overall by history and exam, recent data reviewed with pt, and pt to continue medical treatment as before,  to f/u any worsening symptoms or concerns BP Readings from Last 3 Encounters:  12/30/13 122/80  05/17/13 136/75  04/20/13 128/62

## 2014-01-02 ENCOUNTER — Telehealth: Payer: Self-pay | Admitting: Internal Medicine

## 2014-01-02 NOTE — Telephone Encounter (Signed)
emmi mailed  °

## 2014-01-03 ENCOUNTER — Encounter: Payer: Self-pay | Admitting: Internal Medicine

## 2014-01-18 ENCOUNTER — Other Ambulatory Visit: Payer: Self-pay | Admitting: Internal Medicine

## 2014-03-28 ENCOUNTER — Ambulatory Visit: Payer: Medicare Other | Admitting: Internal Medicine

## 2014-03-29 ENCOUNTER — Ambulatory Visit: Payer: Medicare Other | Admitting: Internal Medicine

## 2014-04-13 ENCOUNTER — Ambulatory Visit: Payer: Medicare Other | Admitting: Cardiovascular Disease

## 2014-04-17 ENCOUNTER — Other Ambulatory Visit: Payer: Self-pay | Admitting: Internal Medicine

## 2014-06-19 ENCOUNTER — Other Ambulatory Visit: Payer: Self-pay | Admitting: *Deleted

## 2014-06-19 MED ORDER — ZOLPIDEM TARTRATE 10 MG PO TABS: ORAL_TABLET | ORAL | Status: DC

## 2014-06-19 NOTE — Telephone Encounter (Signed)
MD is out of office. Pls advise on refill.../lmb 

## 2014-07-04 ENCOUNTER — Ambulatory Visit: Payer: Medicare Other | Admitting: Internal Medicine

## 2014-07-31 DIAGNOSIS — M25522 Pain in left elbow: Secondary | ICD-10-CM | POA: Diagnosis not present

## 2014-07-31 DIAGNOSIS — M5432 Sciatica, left side: Secondary | ICD-10-CM | POA: Diagnosis not present

## 2014-08-17 ENCOUNTER — Other Ambulatory Visit: Payer: Self-pay | Admitting: Internal Medicine

## 2014-08-22 ENCOUNTER — Other Ambulatory Visit: Payer: Self-pay

## 2014-08-24 ENCOUNTER — Telehealth: Payer: Self-pay | Admitting: Internal Medicine

## 2014-08-24 MED ORDER — ZOLPIDEM TARTRATE 10 MG PO TABS: 10.0000 mg | ORAL_TABLET | Freq: Every evening | ORAL | Status: DC | PRN

## 2014-08-24 NOTE — Telephone Encounter (Signed)
Med request has been sent to PCP for review. I do not have a response yet.

## 2014-08-24 NOTE — Telephone Encounter (Signed)
Pt called in and needs refill on his zolpidem (AMBIEN) 10 MG tablet [[id number]]

## 2014-09-12 ENCOUNTER — Encounter: Payer: Self-pay | Admitting: Cardiovascular Disease

## 2014-09-12 ENCOUNTER — Ambulatory Visit (INDEPENDENT_AMBULATORY_CARE_PROVIDER_SITE_OTHER): Payer: Medicare Other | Admitting: Cardiovascular Disease

## 2014-09-12 VITALS — BP 136/70 | HR 78 | Ht 68.0 in | Wt 156.2 lb

## 2014-09-12 DIAGNOSIS — I10 Essential (primary) hypertension: Secondary | ICD-10-CM | POA: Diagnosis not present

## 2014-09-12 DIAGNOSIS — E785 Hyperlipidemia, unspecified: Secondary | ICD-10-CM | POA: Diagnosis not present

## 2014-09-12 NOTE — Progress Notes (Signed)
Cardiology Office Note   Date:  09/12/2014   ID:  Brittney Gates, DOB Nov 11, 1924, MRN [id number]  PCP:  Gwendolyn Grant, MD  Cardiologist:  Sherren Mocha, MD    No chief complaint on file.    History of Present Illness: Brittney Gates is a 81 y.o. female who presents for follow-up evaluation. Last seen in March 2014. She has lower extremity peripheral arterial disease, palpitations, and white coat hypertension. She's had a feeling of "tiredness" in her chest with activity. This is been a complaint now for several years. A stress perfusion study was done in 2012 and this was negative for ischemia. Her left ventricular ejection fraction is normal at 57%.  Symptoms of chest 'tiredness' are unchanged. No exertional CP. DOE is chronic and mild without progression. No leg welling, orthopnea, or PND. Still with issues related to chronic leg pain, gait instability.No ulceration or nonhealing wound. Overall feels like she's doing well for her age.   Past Medical History  Diagnosis Date  . LOW BACK PAIN, CHRONIC   . Diverticulosis   . ANEMIA   . ANXIETY   . Arthritis   . HYPERTENSION   . HYPERLIPIDEMIA   . HYPOTHYROIDISM   . PAD (peripheral artery disease)   . Hiatal hernia   . Gastritis   . Hemorrhoids   . Acute pancreatitis   . Chronic anemia     Past Surgical History  Procedure Laterality Date  . Back surgery      x's 2  . Hemorrhoid surgery    . Angioplasty  1995  . Tubal ligation    . Breast surgery      Benign  . Appendectomy    . Femur im nail  01/21/2012    Procedure: INTRAMEDULLARY (IM) NAIL FEMORAL;  Surgeon: Newt Minion, MD;  Location: [facility];  Service: Orthopedics;  Laterality: Left;  Intertrochanteric Nail Left Hip    Current Outpatient Prescriptions  Medication Sig Dispense Refill  . amLODipine-benazepril (LOTREL) 5-20 MG per capsule Take 1 capsule by mouth daily. 30 capsule 11  . aspirin EC 81 MG tablet Take 81 mg by mouth daily.    . B Complex-C (SUPER  B COMPLEX PO) Take 1 tablet by mouth daily.    . calcium carbonate (OS-CAL) 600 MG TABS Take 600 mg by mouth 2 (two) times daily with a meal.    . Cholecalciferol 2000 UNITS TABS Take 2,000 Units by mouth daily.    Marland Kitchen levothyroxine (SYNTHROID, LEVOTHROID) 125 MCG tablet TAKE 1 TABLET (125 MCG TOTAL) BY MOUTH DAILY BEFORE BREAKFAST. 30 tablet 5  . Multiple Vitamin (MULTIVITAMIN WITH MINERALS) TABS tablet Take 1 tablet by mouth daily.    . naproxen (NAPROSYN) 500 MG tablet Take 1 tablet (500 mg total) by mouth 2 (two) times daily with a meal. As needed for pain 60 tablet 2  . zolpidem (AMBIEN) 10 MG tablet Take 1 tablet (10 mg total) by mouth at bedtime as needed for sleep. 30 tablet 3   No current facility-administered medications for this visit.    Allergies:   Statins   Social History:  The patient  reports that she has never smoked. She does not have any smokeless tobacco history on file. She reports that she does not drink alcohol or use illicit drugs.   Family History:  The patient's  family history includes Gallbladder disease in her daughter and son; Lung cancer in her father. There is no history of Colon cancer.  ROS:  Please see the history of present illness.  Otherwise, review of systems is positive for DOE, chest pain, easy bruising.  All other systems are reviewed and negative.    PHYSICAL EXAM: VS:  BP 136/70 mmHg  Pulse 78  Ht 5\' 8"  (1.727 m)  Wt 156 lb 3.2 oz (70.852 kg)  BMI 23.76 kg/m2 , BMI Body mass index is 23.76 kg/(m^2). GEN: Well nourished, well developed, pleasant elderly woman in no acute distress HEENT: normal Neck: no JVD, no masses. No carotid bruits Cardiac: RRR with 2/6 SEM at the RUSB             Respiratory:  clear to auscultation bilaterally, normal work of breathing GI: soft, nontender, nondistended, + BS MS: no deformity or atrophy Ext: no pretibial edema Skin: warm and dry, no rash Neuro:  Strength and sensation are intact Psych: euthymic  mood, full affect  EKG:  EKG is ordered today. The ekg ordered today shows NSR with sinus arrhythmia 78 bpm, nonspecific T wave changes  Recent Labs: 12/30/2013: ALT 14; BUN 20; Creatinine, Ser 0.8; Hemoglobin 10.6*; Platelets 270.0; Potassium 4.2; Sodium 139; TSH 0.04*   Lipid Panel     Component Value Date/Time   CHOL 206* 12/30/2013 1416   TRIG 114.0 12/30/2013 1416   TRIG 120 03/29/2010   HDL 52.10 12/30/2013 1416   CHOLHDL 4 12/30/2013 1416   VLDL 22.8 12/30/2013 1416   LDLCALC 131* 12/30/2013 1416   LDLDIRECT 143.1 12/08/2012 1223      Wt Readings from Last 3 Encounters:  09/12/14 156 lb 3.2 oz (70.852 kg)  12/30/13 158 lb 8 oz (71.895 kg)  05/16/13 159 lb (72.122 kg)     Cardiac Studies Reviewed: CTA 12/25/2011: IMPRESSION: No evidence of significant aortic or iliac arterial stenosis.  In the right lower extremity, moderate atherosclerotic changes of the distal superficial femoral artery without significant focal stenosis and two-vessel runoff to the right ankle via the peroneal and posterior tibial arteries.  There is some narrowing at the origin of the left superficial femoral artery. There is also felt to be a significant stenosis of the distal superficial femoral artery and the abductor canal. It is densely calcified which may exaggerate the narrowing. Three- vessel runoff to the left ankle.  Varicose veins along the medial left thigh and leg are present. The patient may have venous insufficiency of the left lower extremity. Clinical and sonographic evaluation may be helpful.  Nuclear Scan 04/03/2010: QPS Raw Data Images: Normal; no motion artifact; normal heart/lung ratio. Stress Images: Normal homogeneous uptake in all areas of the myocardium. Rest Images: Normal homogeneous uptake in all areas of the myocardium. Subtraction (SDS): No evidence of ischemia. Transient Ischemic Dilatation: 1.14 (Normal <1.22) Lung/Heart Ratio: 0.41 (Normal  <0.45)  Quantitative Gated Spect Images QGS EDV: 92 ml QGS ESV: 40 ml QGS EF: 57 % QGS cine images: No wall motion abnormalities.  Findings Normal nuclear study  ASSESSMENT AND PLAN: 1.  Chest pain - longstanding with no change in frequency or severity. Negative Myoview in past. Continue clinical follow-up.  2. PAD - asymptomatic. Last imaging studies reviewed. Medical management appropriate.  3. HTN - well-controlled.  Current medicines are reviewed with the patient today.  The patient does not have concerns regarding medicines.  Labs/ tests ordered today include:  No orders of the defined types were placed in this encounter.    Disposition:   FU one year  Signed, Sherren Mocha, MD  09/12/2014 3:09 PM  [facility] [place], [place], [place]  [address] Phone: [phone]; Fax: [phone]

## 2014-09-12 NOTE — Patient Instructions (Signed)

## 2014-10-09 ENCOUNTER — Other Ambulatory Visit: Payer: Self-pay

## 2014-10-09 MED ORDER — LEVOTHYROXINE SODIUM 125 MCG PO TABS: 125.0000 ug | ORAL_TABLET | Freq: Every day | ORAL | Status: DC

## 2014-10-30 ENCOUNTER — Encounter: Payer: Self-pay | Admitting: Internal Medicine

## 2014-10-30 ENCOUNTER — Ambulatory Visit (INDEPENDENT_AMBULATORY_CARE_PROVIDER_SITE_OTHER): Payer: Medicare Other | Admitting: Internal Medicine

## 2014-10-30 ENCOUNTER — Ambulatory Visit (INDEPENDENT_AMBULATORY_CARE_PROVIDER_SITE_OTHER)
Admission: RE | Admit: 2014-10-30 | Discharge: 2014-10-30 | Disposition: A | Discharge status: Home / Self Care | Payer: Medicare Other | Source: Ambulatory Visit | Attending: Internal Medicine | Admitting: Internal Medicine

## 2014-10-30 ENCOUNTER — Other Ambulatory Visit (INDEPENDENT_AMBULATORY_CARE_PROVIDER_SITE_OTHER): Payer: Medicare Other

## 2014-10-30 VITALS — BP 108/58 | HR 67 | Temp 97.5°F | Ht 68.0 in | Wt 159.5 lb

## 2014-10-30 DIAGNOSIS — M8580 Other specified disorders of bone density and structure, unspecified site: Secondary | ICD-10-CM | POA: Diagnosis not present

## 2014-10-30 DIAGNOSIS — R9389 Abnormal findings on diagnostic imaging of other specified body structures: Secondary | ICD-10-CM

## 2014-10-30 DIAGNOSIS — M810 Age-related osteoporosis without current pathological fracture: Secondary | ICD-10-CM | POA: Diagnosis not present

## 2014-10-30 DIAGNOSIS — E039 Hypothyroidism, unspecified: Secondary | ICD-10-CM

## 2014-10-30 DIAGNOSIS — Z0000 Encounter for general adult medical examination without abnormal findings: Secondary | ICD-10-CM

## 2014-10-30 DIAGNOSIS — H9192 Unspecified hearing loss, left ear: Secondary | ICD-10-CM

## 2014-10-30 DIAGNOSIS — I10 Essential (primary) hypertension: Secondary | ICD-10-CM

## 2014-10-30 DIAGNOSIS — R938 Abnormal findings on diagnostic imaging of other specified body structures: Secondary | ICD-10-CM | POA: Diagnosis not present

## 2014-10-30 LAB — HEPATIC FUNCTION PANEL
ALT: 14 U/L (ref 0–35)
AST: 18 U/L (ref 0–37)
Albumin: 4.4 g/dL (ref 3.5–5.2)
Alkaline Phosphatase: 67 U/L (ref 39–117)
Bilirubin, Direct: 0.1 mg/dL (ref 0.0–0.3)
Total Bilirubin: 0.6 mg/dL (ref 0.2–1.2)
Total Protein: 7.3 g/dL (ref 6.0–8.3)

## 2014-10-30 LAB — CBC WITH DIFFERENTIAL/PLATELET
Basophils Absolute: 0.1 10*3/uL (ref 0.0–0.1)
Basophils Relative: 0.8 % (ref 0.0–3.0)
Eosinophils Absolute: 0.2 10*3/uL (ref 0.0–0.7)
Eosinophils Relative: 2.6 % (ref 0.0–5.0)
HCT: 32.5 % — ABNORMAL LOW (ref 36.0–46.0)
Hemoglobin: 10.8 g/dL — ABNORMAL LOW (ref 12.0–15.0)
Lymphocytes Relative: 47.3 % — ABNORMAL HIGH (ref 12.0–46.0)
Lymphs Abs: 3.8 10*3/uL (ref 0.7–4.0)
MCHC: 33.4 g/dL (ref 30.0–36.0)
MCV: 104.7 fl — ABNORMAL HIGH (ref 78.0–100.0)
Monocytes Absolute: 0.4 10*3/uL (ref 0.1–1.0)
Monocytes Relative: 5.4 % (ref 3.0–12.0)
Neutro Abs: 3.5 10*3/uL (ref 1.4–7.7)
Neutrophils Relative %: 43.9 % (ref 43.0–77.0)
Platelets: 274 10*3/uL (ref 150.0–400.0)
RBC: 3.1 Mil/uL — ABNORMAL LOW (ref 3.87–5.11)
RDW: 23.9 % — ABNORMAL HIGH (ref 11.5–15.5)
WBC: 8 10*3/uL (ref 4.0–10.5)

## 2014-10-30 LAB — BASIC METABOLIC PANEL
BUN: 21 mg/dL (ref 6–23)
CO2: 27 mEq/L (ref 19–32)
Calcium: 9.8 mg/dL (ref 8.4–10.5)
Chloride: 104 mEq/L (ref 96–112)
Creatinine, Ser: 0.84 mg/dL (ref 0.40–1.20)
GFR: 68 mL/min (ref >60.00)
Glucose, Bld: 100 mg/dL — ABNORMAL HIGH (ref 70–99)
Potassium: 4.7 mEq/L (ref 3.5–5.1)
Sodium: 140 mEq/L (ref 135–145)

## 2014-10-30 LAB — URINALYSIS, ROUTINE W REFLEX MICROSCOPIC
Bilirubin Urine: NEGATIVE
Ketones, ur: NEGATIVE
Leukocytes, UA: NEGATIVE
Nitrite: NEGATIVE
Specific Gravity, Urine: <=1.005 — AB (ref 1.000–1.030)
Total Protein, Urine: NEGATIVE
Urine Glucose: NEGATIVE
Urobilinogen, UA: 0.2 (ref 0.0–1.0)
pH: 6 (ref 5.0–8.0)

## 2014-10-30 LAB — LIPID PANEL
Cholesterol: 196 mg/dL (ref 0–200)
HDL: 51 mg/dL (ref >39.00)
LDL Cholesterol: 116 mg/dL — ABNORMAL HIGH (ref 0–99)
NonHDL: 145.07
Total CHOL/HDL Ratio: 4
Triglycerides: 147 mg/dL (ref 0.0–149.0)
VLDL: 29.4 mg/dL (ref 0.0–40.0)

## 2014-10-30 LAB — TSH: TSH: 0.05 u[IU]/mL — ABNORMAL LOW (ref 0.35–4.50)

## 2014-10-30 MED ORDER — AMLODIPINE BESY-BENAZEPRIL HCL 5-20 MG PO CAPS: 1.0000 | ORAL_CAPSULE | Freq: Every day | ORAL | Status: DC

## 2014-10-30 MED ORDER — NAPROXEN 500 MG PO TABS: 500.0000 mg | ORAL_TABLET | ORAL | Status: DC | PRN

## 2014-10-30 MED ORDER — ZOLPIDEM TARTRATE 10 MG PO TABS: 10.0000 mg | ORAL_TABLET | Freq: Every evening | ORAL | Status: DC | PRN

## 2014-10-30 NOTE — Patient Instructions (Signed)
It was good to see you today.  We have reviewed your prior records including labs and tests today  Health Maintenance reviewed - will schedule bone density - all other recommended immunizations and age-appropriate screenings are up-to-date.  Test(s) ordered today. Your results will be released to [place] (or called to you) after review, usually within 72hours after test completion. If any changes need to be made, you will be notified at that same time.  we'll make referral to CT chest and for hearing test. Our office will contact you regarding appointment(s) once made.  Medications reviewed and updated, no changes recommended at this time.  Please schedule followup in 12 months for annual exam and labs, call sooner if problems.

## 2014-10-30 NOTE — Assessment & Plan Note (Signed)
Reviewed 05/2012 pulm CT report Recheck CT 10/2012 with no change in size Overdue for 12 mo followup - will schedule same now Reviewed same with pt in depth today

## 2014-10-30 NOTE — Assessment & Plan Note (Addendum)
Recheck now to monitor for potential progression of disease since 05/2012 Hx L hip fracture after fall 2014 Continue Ca + Vit D and WB exercises (walk 5x/week)

## 2014-10-30 NOTE — Progress Notes (Signed)
Subjective:    Patient ID: Brittney Gates, female    DOB: 1924/06/01, 81 y.o.   MRN: [id number]  HPI   Here for medicare wellness  Diet: heart healthy, carb modified Physical activity: sedentary Depression/mood screen: negative Hearing: intact to whispered voice Visual acuity: grossly normal, performs annual eye exam  ADLs: capable Fall risk: none, uses cane Home safety: good Cognitive evaluation: intact to orientation, naming, recall and repetition EOL planning: adv directives, full code/ I agree  I have personally reviewed and have noted 1. The patient's medical and social history 2. Their use of alcohol, tobacco or illicit drugs 3. Their current medications and supplements 4. The patient's functional ability including ADL's, fall risks, home safety risks and hearing or visual impairment. 5. Diet and physical activities 6. Evidence for depression or mood disorders  Also reviewed chronic medical issues, current concerns and interval events  Past Medical History  Diagnosis Date  . LOW BACK PAIN, CHRONIC   . Diverticulosis   . ANEMIA   . ANXIETY   . Arthritis   . HYPERTENSION   . HYPERLIPIDEMIA   . HYPOTHYROIDISM   . PAD (peripheral artery disease)   . Hiatal hernia   . Gastritis   . Hemorrhoids   . Acute pancreatitis   . Chronic anemia    Family History  Problem Relation Age of Onset  . Lung cancer Father   . Gallbladder disease Son   . Gallbladder disease Daughter   . Colon cancer Neg Hx    History  Substance Use Topics  . Smoking status: Never Smoker   . Smokeless tobacco: Not on file  . Alcohol Use: No    Review of Systems  Constitutional: Negative for fatigue and unexpected weight change.  HENT: Positive for hearing loss (L>R).   Respiratory: Negative for cough, shortness of breath and wheezing.   Cardiovascular: Negative for chest pain, palpitations and leg swelling.  Gastrointestinal: Negative for nausea, abdominal pain and diarrhea.    Musculoskeletal: Positive for arthralgias.  Neurological: Positive for numbness (ble feet, chronic ). Negative for dizziness, weakness, light-headedness and headaches.  Psychiatric/Behavioral: Negative for dysphoric mood. The patient is not nervous/anxious.   All other systems reviewed and are negative.  Patient Care Team: Rowe Clack, MD as PCP - General (Internal Medicine) Sherren Mocha, MD as Consulting Physician (Cardiology) Newt Minion, MD as Consulting Physician (Orthopedic Surgery) Jenne Campus, MD as Consulting Physician (Neurosurgery) Calvert Cantor, MD as Consulting Physician (Ophthalmology) Carolan Clines, MD as Consulting Physician (Urology) Bo Merino, MD (Rheumatology) Rozetta Nunnery, MD (Otolaryngology)     Objective:    Physical Exam  Constitutional: She appears well-developed and well-nourished. No distress.  Cardiovascular: Normal rate, regular rhythm and normal heart sounds.   No murmur heard. Pulmonary/Chest: Effort normal and breath sounds normal. No respiratory distress.  Musculoskeletal: She exhibits no edema.    BP 108/58 mmHg  Pulse 67  Temp(Src) 97.5 F (36.4 C) (Oral)  Ht 5\' 8"  (1.727 m)  Wt 159 lb 8 oz (72.349 kg)  BMI 24.26 kg/m2  SpO2 96% Wt Readings from Last 3 Encounters:  10/30/14 159 lb 8 oz (72.349 kg)  09/12/14 156 lb 3.2 oz (70.852 kg)  12/30/13 158 lb 8 oz (71.895 kg)    Lab Results  Component Value Date   WBC 9.6 12/30/2013   HGB 10.6* 12/30/2013   HCT 32.5* 12/30/2013   PLT 270.0 12/30/2013   GLUCOSE 96 12/30/2013   CHOL 206* 12/30/2013  TRIG 114.0 12/30/2013   HDL 52.10 12/30/2013   LDLDIRECT 143.1 12/08/2012   LDLCALC 131* 12/30/2013   ALT 14 12/30/2013   AST 19 12/30/2013   NA 139 12/30/2013   K 4.2 12/30/2013   CL 104 12/30/2013   CREATININE 0.8 12/30/2013   BUN 20 12/30/2013   CO2 24 12/30/2013   TSH 0.04* 12/30/2013   INR 1.55* 01/23/2012   HGBA1C 6.0 03/29/2010    US Abdomen  Complete  05/15/2013   CLINICAL DATA:  Abdominal pain/pancreatitis.  EXAM: ULTRASOUND ABDOMEN COMPLETE  COMPARISON:  04/08/2007  FINDINGS: Gallbladder:  Gallbladder is mildly distended without evidence of gallstones, sludge or wall thickening. No pericholecystic fluid. Negative sonographic Murphy's sign.  Common bile duct:  Diameter: 8.3 mm.  No evidence of ductal stones.  Liver:  No focal lesion identified. Within normal limits in parenchymal echogenicity.  IVC:  No abnormality visualized.  Pancreas:  Visualized portion unremarkable. Borderline enlargement of the pancreatic duct measuring 3.3 mm.  Spleen:  Size and appearance within normal limits.  Right Kidney:  Length: 10.1 cm. Echogenicity within normal limits. No mass or hydronephrosis visualized.  Left Kidney:  Length: 9.4 cm. Echogenicity within normal limits. No mass or hydronephrosis visualized.  Abdominal aorta:  No aneurysm visualized.  Other findings:  None.  IMPRESSION: Mild gallbladder distention without evidence of stones/sludge or wall thickening. Minimal prominence of the common bowel duct and main pancreatic duct. No evidence of ductal stones. Consider CT to exclude pancreatic head/ ampullary mass.   Electronically Signed   By: Marin Olp M.D.   On: 05/15/2013 07:06   Ct Abd Wo & W Cm  05/15/2013   CLINICAL DATA:  possible pancreatic mass  EXAM: CT ABDOMEN WITHOUT AND WITH CONTRAST  TECHNIQUE: Multidetector CT imaging of the abdomen was performed following the standard protocol before and following the bolus administration of intravenous contrast.  CONTRAST:  162mL OMNIPAQUE IOHEXOL 300 MG/ML  SOLN  COMPARISON:  None.  FINDINGS: Mild hypoventilation is identified within the lung bases, also minimal pleural thickening in the left lung base.  The liver, spleen, adrenals, right kidney are unremarkable. Evaluation of the left kidney demonstrates a small 5 mm fatty appearing nodule along the posterior upper pole likely reflecting a small  angiomyolipoma. A small extrarenal pelvis identified on the left. Left kidney is otherwise unremarkable.  The pancreas is unremarkable. There is prominence of the common bile duct likely secondary to patient's age.  There is no evidence of bowel obstruction, enteritis, colitis or diverticulitis. Instruments made of a duodenum diverticulum along the second portion of the duodenum.  There is no evidence of an abdominal aortic aneurysm. Atherosclerotic calcifications are appreciated within the abdominal aorta. The celiac, SMA, IMA, SMV are opacified. There is no evidence of abdominal masses, free fluid, loculated fluid collections, or adenopathy.  Evaluation of the osseous structures demonstrates no evidence of aggressive appearing osseous lesions, multilevel spondylosis within the spine. There is no evidence of abdominal wall hernia.  IMPRESSION: There is no evidence of obstructive or inflammatory abnormalities. 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 No further focal acute abnormalities appreciated. Specifically there is no CT evidence of pancreatic masses.  Atherosclerotic calcification identified within the aorta.   Electronically Signed   By: Margaree Mackintosh M.D.   On: 05/15/2013 13:54       Assessment & Plan:   AWV/z00.00 - Today patient counseled on age appropriate routine health concerns for screening and prevention,  each reviewed and up to date or declined. Immunizations reviewed and up to date or declined. Labs ordered and reviewed. Risk factors for depression reviewed and negative. Hearing function and visual acuity are intact -and will refer for audiology evaluation of hearing concerns on left. ADLs screened and addressed as needed. Functional ability and level of safety reviewed and appropriate. Education, counseling and referrals performed based on assessed risks today. Patient provided with a copy of personalized plan for preventive  services.   Problem List Items Addressed This Visit    Abnormal CT scan, chest    Reviewed 05/2012 pulm CT report Recheck CT 10/2012 with no change in size Overdue for 12 mo followup - will schedule same now Reviewed same with pt in depth today      Relevant Orders   Hepatic function panel   CBC w/Diff   CT Chest W Contrast   Essential hypertension    BP Readings from Last 3 Encounters:  10/30/14 108/58  09/12/14 136/70  12/30/13 122/80   BP runs low normal, but feels well - no dizzy or fatigue symptoms Discussed dropping amlodipine component of Lotrel combo, but pt wishes to continue same for now      Relevant Medications   amLODipine-benazepril (LOTREL) 5-20 MG per capsule   Other Relevant Orders   Lipid Profile   Urinalysis, Routine w reflex microscopic   Basic Metabolic Panel (BMET)   Hypothyroidism    Check TSH and adjust as needed  Lab Results  Component Value Date   TSH 0.04* 12/30/2013        Relevant Orders   TSH   Osteopenia    Recheck now to monitor for potential progression of disease since 05/2012 Hx L hip fracture after fall 2014 Continue Ca + Vit D and WB exercises (walk 5x/week)       Other Visit Diagnoses    Annual physical exam    -  Primary    Senile osteoporosis        Relevant Orders    DG Bone Density    TSH    Hearing loss, left        Relevant Orders    Ambulatory referral to Audiology        Gwendolyn Grant, MD

## 2014-10-30 NOTE — Assessment & Plan Note (Signed)
Check TSH and adjust as needed  Lab Results  Component Value Date   TSH 0.04* 12/30/2013

## 2014-10-30 NOTE — Progress Notes (Signed)
Pre visit review using our clinic review tool, if applicable. No additional management support is needed unless otherwise documented below in the visit note. 

## 2014-10-30 NOTE — Assessment & Plan Note (Signed)
BP Readings from Last 3 Encounters:  10/30/14 108/58  09/12/14 136/70  12/30/13 122/80   BP runs low normal, but feels well - no dizzy or fatigue symptoms Discussed dropping amlodipine component of Lotrel combo, but pt wishes to continue same for now

## 2014-11-10 ENCOUNTER — Telehealth: Payer: Self-pay | Admitting: Internal Medicine

## 2014-11-10 ENCOUNTER — Ambulatory Visit (INDEPENDENT_AMBULATORY_CARE_PROVIDER_SITE_OTHER)
Admission: RE | Admit: 2014-11-10 | Discharge: 2014-11-10 | Disposition: A | Discharge status: Home / Self Care | Payer: Medicare Other | Source: Ambulatory Visit | Attending: Internal Medicine | Admitting: Internal Medicine

## 2014-11-10 ENCOUNTER — Telehealth: Payer: Self-pay | Admitting: *Deleted

## 2014-11-10 DIAGNOSIS — J984 Other disorders of lung: Secondary | ICD-10-CM | POA: Diagnosis not present

## 2014-11-10 DIAGNOSIS — R938 Abnormal findings on diagnostic imaging of other specified body structures: Secondary | ICD-10-CM | POA: Diagnosis not present

## 2014-11-10 DIAGNOSIS — R911 Solitary pulmonary nodule: Secondary | ICD-10-CM | POA: Diagnosis not present

## 2014-11-10 DIAGNOSIS — R9389 Abnormal findings on diagnostic imaging of other specified body structures: Secondary | ICD-10-CM

## 2014-11-10 MED ORDER — LEVOTHYROXINE SODIUM 112 MCG PO TABS: 112.0000 ug | ORAL_TABLET | Freq: Every day | ORAL | Status: DC

## 2014-11-10 NOTE — Telephone Encounter (Signed)
Called Brittney Gates back [facility] will send updated script to [facility]...Brittney Gates

## 2014-11-10 NOTE — Telephone Encounter (Signed)
[facility] [place] [facility]  Patient Name: Brittney Gates  DOB: 06/21/1924    Initial Comment Caller states that has been loosing control of her bowels. States that when passes gas is leaking. States that is doesn't hurt. 2nd number his her son John's number    Nurse Assessment  Nurse: Justine Null, RN, Rodena Piety Date/Time (Eastern Time): 11/10/2014 3:16:43 PM  Confirm and document reason for call. If symptomatic, describe symptoms. ---Caller states that has been loosing control of her bowels. States that when passes gas is leaking. States that is doesn't hurt. 2nd number his her son John's number caller stated that she has been having a problem with her bowels and has been having a BM has to push hard and has pencil thin stools and ahs been taking Mira lax and ahs been having the gas passage and has been having leakage of stool and has a small amount of soft stool and ahs been no abdominal pain and has had no fevers and gradually been occurring over the past 3 years  Has the patient traveled out of the country within the last 30 days? ---No  Does the patient require triage? ---Yes  Related visit to physician within the last 2 weeks? ---Yes  Does the PT have any chronic conditions? (i.e. diabetes, asthma, etc.) ---No     Guidelines    Guideline Title Affirmed Question Affirmed Notes  Rectal Symptoms MODERATE-SEVERE rectal pain (i.e., interferes with school, work, or sleep)    Final Disposition User   See Physician within May, RN, [facility]   Disagree/Comply: Comply

## 2014-11-10 NOTE — Telephone Encounter (Signed)
Left msg on triage stating received lab letter. MD stated wanting thyroid med decrease to 112 mcg. Need rx call into pharmacy...Johny Chess

## 2014-11-11 ENCOUNTER — Ambulatory Visit (INDEPENDENT_AMBULATORY_CARE_PROVIDER_SITE_OTHER): Payer: Medicare Other | Admitting: Family Medicine

## 2014-11-11 ENCOUNTER — Encounter: Payer: Self-pay | Admitting: Family Medicine

## 2014-11-11 VITALS — BP 142/64 | Temp 97.6°F | Ht 68.0 in | Wt 159.0 lb

## 2014-11-11 DIAGNOSIS — R159 Full incontinence of feces: Secondary | ICD-10-CM

## 2014-11-11 NOTE — Progress Notes (Signed)
Subjective:   Patient ID: Brittney Gates, female    DOB: [birth year], 81 y.o.   MRN: [id number]  Brittney Gates is a pleasant 81 y.o. year old female pt of Dr. Asa Lente, new to me, who presents to weekend clinic today with Diarrhea  on 11/11/2014  HPI:  Concerned about fecal incontinence that has been occurring intermittently for past 3 years.  Started after she had a severe bowel impaction s/p hip fracture.  Mainly occurs when she passes gas- leaks a little bit of stool.  BMs are not painful.  Denies blod in her stool.  Has had some constipation with pencil thin and firm stools that feel "explosive."  Has been taking miralax which makes her gasy and seems to worsen her bowel leakage with flatulence.  Saw PCP earlier this month for wellness visit.  Chart reviewed.  No nausea or vomiting.  No fevers.  No abdominal pain.  She called Dr. Dalbert Batman but was told she needed a referral from PCP. Dr. Olevia Perches is her gastroenterologist.  Current Outpatient Prescriptions on File Prior to Visit  Medication Sig Dispense Refill  . amLODipine-benazepril (LOTREL) 5-20 MG per capsule Take 1 capsule by mouth daily. 90 capsule 3  . aspirin EC 81 MG tablet Take 81 mg by mouth daily.    . calcium carbonate (OS-CAL) 600 MG TABS Take 600 mg by mouth 2 (two) times daily with a meal.    . Cholecalciferol 2000 UNITS TABS Take 2,000 Units by mouth daily.    Marland Kitchen levothyroxine (SYNTHROID, LEVOTHROID) 112 MCG tablet Take 1 tablet (112 mcg total) by mouth daily before breakfast. 90 tablet 1  . Multiple Vitamin (MULTIVITAMIN WITH MINERALS) TABS tablet Take 1 tablet by mouth daily.    . naproxen (NAPROSYN) 500 MG tablet Take 1 tablet (500 mg total) by mouth as needed for moderate pain or headache. As needed for pain 60 tablet 2  . zolpidem (AMBIEN) 10 MG tablet Take 1 tablet (10 mg total) by mouth at bedtime as needed for sleep. 30 tablet 3   No current facility-administered medications on file prior to visit.    Allergies    Allergen Reactions  . Statins Other (See Comments)    Leg weakness    Past Medical History  Diagnosis Date  . LOW BACK PAIN, CHRONIC   . Diverticulosis   . ANEMIA   . ANXIETY   . Arthritis   . HYPERTENSION   . HYPERLIPIDEMIA   . HYPOTHYROIDISM   . PAD (peripheral artery disease)   . Hiatal hernia   . Gastritis   . Hemorrhoids   . Acute pancreatitis   . Chronic anemia     Past Surgical History  Procedure Laterality Date  . Back surgery      x's 2  . Hemorrhoid surgery    . Angioplasty  1995  . Tubal ligation    . Breast surgery      Benign  . Appendectomy    . Femur im nail  01/21/2012    Procedure: INTRAMEDULLARY (IM) NAIL FEMORAL;  Surgeon: Newt Minion, MD;  Location: [facility];  Service: Orthopedics;  Laterality: Left;  Intertrochanteric Nail Left Hip    Family History  Problem Relation Age of Onset  . Lung cancer Father   . Gallbladder disease Son   . Gallbladder disease Daughter   . Colon cancer Neg Hx     Social History   Social History  . Marital Status: Widowed    Spouse  Name: N/A  . Number of Children: N/A  . Years of Education: N/A   Occupational History  . Not on file.   Social History Main Topics  . Smoking status: Never Smoker   . Smokeless tobacco: Not on file  . Alcohol Use: No  . Drug Use: No  . Sexual Activity: Not on file   Other Topics Concern  . 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    Review of Systems  Constitutional: Negative.   Gastrointestinal: Positive for diarrhea and constipation. Negative for nausea, vomiting, abdominal pain, blood in stool, abdominal distention, anal bleeding and rectal  pain.  Skin: Negative.   All other systems reviewed and are negative.      Objective:    BP 142/64 mmHg  Temp(Src) 97.6 F (36.4 C) (Oral)  Ht 5\' 8"  (1.727 m)  Wt 159 lb (72.122 kg)  BMI 24.18 kg/m2   Physical Exam  Constitutional: She is oriented to person, place, and time. She appears well-developed and well-nourished. No distress.  HENT:  Head: Normocephalic.  Eyes: Conjunctivae are normal.  Cardiovascular: Normal rate.   Pulmonary/Chest: Effort normal.  Abdominal: Soft. She exhibits no distension and no mass. There is no tenderness. There is no rebound and no guarding.  Neurological: She is alert and oriented to person, place, and time. No cranial nerve deficit.  Skin: Skin is warm and dry.  Nursing note and vitals reviewed.         Assessment & Plan:   Fecal incontinence No Follow-up on file.

## 2014-11-11 NOTE — Progress Notes (Signed)
Pre visit review using our clinic review tool, if applicable. No additional management support is needed unless otherwise documented below in the visit note. 

## 2014-11-11 NOTE — Assessment & Plan Note (Signed)
Chronic issue with constipation. Advised to take miralax every other day for next few days. Placed referral to make an appointment with Dr. Olevia Perches. Exam reassuring. She will call PCP's office on Monday to make sure referral is in process. The patient indicates understanding of these issues and agrees with the plan.

## 2014-11-11 NOTE — Patient Instructions (Signed)
Great to see meet you. Try taking Miralax every other day.  Call Dr. Katheren Puller office on Monday.

## 2014-11-13 ENCOUNTER — Telehealth: Payer: Self-pay | Admitting: *Deleted

## 2014-11-13 NOTE — Telephone Encounter (Signed)
[facility] Call [facility] Patient Name: Brittney Gates Gender: Female DOB: 1924-09-04 Age: 81 Y 61 M 11 D Return Phone Number: [phone] (Primary), [phone] (Secondary) Address: City/State/Zip: [place] Client [place] [facility] - Client Client Site [facility] Physician Gwendolyn Grant Contact Type Call Call Type Triage / Clinical Relationship To Patient Self Appointment Disposition EMR Appointment Not Necessary Info pasted into Epic Yes Return Phone Number [phone] (Primary) Chief Complaint Rectal Symptoms Initial Comment Caller states that has been loosing control of her bowels. States that when passes gas is leaking. States that is doesn't hurt. 2nd number his her son John's number PreDisposition Call Doctor Nurse Assessment Nurse: Justine Null, RN, Rodena Piety Date/Time Eilene Ghazi Time): 11/10/2014 3:16:43 PM Confirm and document reason for call. If symptomatic, describe symptoms. ---Caller states that has been loosing control of her bowels. States that when passes gas is leaking. States that is doesn't hurt. 2nd number his her son John's number caller stated that she has been having a problem with her bowels and has been having a BM has to push hard and has pencil thin stools and ahs been taking Mira lax and ahs been having the gas passage and has been having leakage of stool and has a small amount of soft stool and ahs been no abdominal pain and has had no fevers and gradually been occurring over the past 3 years Has the patient traveled out of the country within the last 30 days? ---No Does the patient require triage? ---Yes Related visit to physician within the last 2 weeks? ---Yes Does the PT have any chronic conditions? (i.e. diabetes, asthma, etc.) ---No Guidelines Guideline Title Affirmed Question Affirmed Notes Nurse Date/Time Eilene Ghazi Time) Rectal Symptoms  MODERATE-SEVERE rectal pain (i.e., interferes with school, work, or sleep) Justine Null, RN, Rodena Piety 11/10/2014 3:21:56 PM Disp. Time Eilene Ghazi Time) Disposition Final User 11/10/2014 3:25:02 PM See Physician within 24 Hours Yes Justine Null, RN, Rodena Piety PLEASE NOTE: All timestamps contained within this report are represented as [place] Standard Time. CONFIDENTIALTY NOTICE: This fax transmission is intended only for the addressee. It contains information that is legally privileged, confidential or otherwise protected from use or disclosure. If you are not the intended recipient, you are strictly prohibited from reviewing, disclosing, copying using or disseminating any of this information or taking any action in reliance on or regarding this information. If you have received this fax in error, please notify us immediately by telephone so that we can arrange for its return to [place]. Phone: [phone], Toll-Free: [phone], Fax: [phone] Page: 2 of 2 Call Id: [id number] Caller Understands: Yes Disagree/Comply: Comply Care Advice Given Per Guideline SEE PHYSICIAN WITHIN 24 HOURS: * IF OFFICE WILL BE OPEN: You need to be examined within the next 24 hours. Call your doctor when the office opens, and make an appointment. WARM SALINE SITZ BATHS TWICE DAILY FOR RECTAL SYMPTOMS: * Sit in a warm sitz bath for 20 minutes twice a day. This will decrease swelling and irritation, keep the area clean, and help with healing. * Afterwards, pat area dry with unscented toilet paper. PAIN MEDICINES: * For pain relief, take acetaminophen, ibuprofen, or naproxen. * Use the lowest amount that makes your pain feel better. ACETAMINOPHEN (E.G., TYLENOL): CALL BACK IF: * Severe pain * Fever over 100.5 F (38.1 C) * You become worse. CARE ADVICE given per Rectal Symptoms (Adult) guideline. After Care Instructions Given Call Event Type User Date / Time  Description Referrals [facility]

## 2014-11-15 ENCOUNTER — Ambulatory Visit: Payer: Medicare Other | Admitting: Internal Medicine

## 2014-11-20 DIAGNOSIS — H903 Sensorineural hearing loss, bilateral: Secondary | ICD-10-CM | POA: Diagnosis not present

## 2014-12-07 ENCOUNTER — Other Ambulatory Visit: Payer: Self-pay | Admitting: Internal Medicine

## 2014-12-12 ENCOUNTER — Other Ambulatory Visit: Payer: Self-pay | Admitting: Internal Medicine

## 2014-12-14 DIAGNOSIS — Z23 Encounter for immunization: Secondary | ICD-10-CM | POA: Diagnosis not present

## 2014-12-22 ENCOUNTER — Other Ambulatory Visit: Payer: Self-pay | Admitting: Internal Medicine

## 2015-01-11 ENCOUNTER — Telehealth: Payer: Self-pay | Admitting: Internal Medicine

## 2015-01-11 NOTE — Telephone Encounter (Signed)
Call her back at [phone]

## 2015-01-11 NOTE — Telephone Encounter (Signed)
Spoke with patient and she is having constipation and bloating. States she takes Miralax but still having problems. Patient only wants to see MD. Scheduled with Dr. Silverio Decamp on 03/09/15. At 2:45 PM.

## 2015-02-01 DIAGNOSIS — H353131 Nonexudative age-related macular degeneration, bilateral, early dry stage: Secondary | ICD-10-CM | POA: Diagnosis not present

## 2015-02-01 DIAGNOSIS — H524 Presbyopia: Secondary | ICD-10-CM | POA: Diagnosis not present

## 2015-03-09 ENCOUNTER — Ambulatory Visit: Payer: Medicare Other | Admitting: Gastroenterology

## 2015-03-13 ENCOUNTER — Ambulatory Visit: Payer: Medicare Other | Admitting: Gastroenterology

## 2015-04-11 ENCOUNTER — Other Ambulatory Visit: Payer: Self-pay | Admitting: Internal Medicine

## 2015-04-12 ENCOUNTER — Telehealth: Payer: Self-pay

## 2015-04-12 MED ORDER — ZOLPIDEM TARTRATE 5 MG PO TABS: 5.0000 mg | ORAL_TABLET | Freq: Every evening | ORAL | Status: DC | PRN

## 2015-04-12 NOTE — Telephone Encounter (Signed)
Will fill with 1 refill until visit with me but cannot rx 10 mg due to age. Rx for 5 mg nightly.

## 2015-04-12 NOTE — Telephone Encounter (Signed)
i have scheduled appt for patient to come in to see you on feb 20th (she is due for thyroid labs anyways) ---are you ok with refilling ambien rx request until that appt?   Please advise, thanks

## 2015-04-12 NOTE — Telephone Encounter (Signed)
[facility] requesting rx refill for [employer] 10mg  tab (one daily at bedtime for insomnia)---please advise, thanks

## 2015-04-12 NOTE — Telephone Encounter (Signed)
ambien rx faxed to [facility]

## 2015-04-14 ENCOUNTER — Encounter: Payer: Self-pay | Admitting: Family Medicine

## 2015-04-14 ENCOUNTER — Ambulatory Visit (INDEPENDENT_AMBULATORY_CARE_PROVIDER_SITE_OTHER): Payer: Medicare Other | Admitting: Family Medicine

## 2015-04-14 VITALS — BP 138/64 | HR 72 | Temp 97.9°F | Wt 160.0 lb

## 2015-04-14 DIAGNOSIS — J069 Acute upper respiratory infection, unspecified: Secondary | ICD-10-CM | POA: Diagnosis not present

## 2015-04-14 DIAGNOSIS — B9789 Other viral agents as the cause of diseases classified elsewhere: Principal | ICD-10-CM

## 2015-04-14 MED ORDER — AZITHROMYCIN 250 MG PO TABS: ORAL_TABLET | ORAL | Status: DC

## 2015-04-14 MED ORDER — HYDROCODONE-HOMATROPINE 5-1.5 MG/5ML PO SYRP: 5.0000 mL | ORAL_SOLUTION | Freq: Every evening | ORAL | Status: DC | PRN

## 2015-04-14 NOTE — Progress Notes (Signed)
Subjective:    Patient ID: Brittney Gates, female    DOB: April 30, 1924, 82 y.o.   MRN: [id number]  HPI Here with L ear pain -(3 days)  Feels very deep It shoots up the L side of her head   ? If sinus pain- has a dull headache   Now has a headache and does not feel good  Dry cough also  Nasal passages are sore - thinks she has a virus coming on   Prone to chest congestion and bronchitis-often ends up in the ED   Not dizzy   Cough is dry for the most part  Nose is runny on and off  Post nasal drip  Clears throat a lot   No fever   ? If any significant sick contacts   Patient Active Problem List   Diagnosis Date Noted  . Viral URI with cough 04/14/2015  . Fecal incontinence 11/11/2014  . Acute pancreatitis 05/15/2013  . Cold sore 03/29/2013  . Abnormal CT scan, chest 05/06/2012  . Osteoporosis 04/30/2012  . Pre-operative cardiovascular examination 01/21/2012  . Hip fracture left 01/20/2012  . PVD (peripheral vascular disease) ([place])   . ANEMIA 04/24/2010  . Pre-diabetes 04/24/2010  . Hypothyroidism 08/14/2007  . Hyperlipidemia 08/14/2007  . ANXIETY 08/14/2007  . Essential hypertension 08/14/2007  . ARTHRITIS 08/14/2007  . LOW BACK PAIN, CHRONIC 08/14/2007   Past Medical History  Diagnosis Date  . LOW BACK PAIN, CHRONIC   . Diverticulosis   . ANEMIA   . ANXIETY   . Arthritis   . HYPERTENSION   . HYPERLIPIDEMIA   . HYPOTHYROIDISM   . PAD (peripheral artery disease) ([place])   . Hiatal hernia   . Gastritis   . Hemorrhoids   . Acute pancreatitis   . Chronic anemia    Past Surgical History  Procedure Laterality Date  . Back surgery      x's 2  . Hemorrhoid surgery    . Angioplasty  1995  . Tubal ligation    . Breast surgery      Benign  . Appendectomy    . Femur im nail  01/21/2012    Procedure: INTRAMEDULLARY (IM) NAIL FEMORAL;  Surgeon: Newt Minion, MD;  Location: [facility];  Service: Orthopedics;  Laterality: Left;  Intertrochanteric Nail Left Hip    Social History  Substance Use Topics  . Smoking status: Never Smoker   . Smokeless tobacco: None  . Alcohol Use: No   Family History  Problem Relation Age of Onset  . Lung cancer Father   . Gallbladder disease Son   . Gallbladder disease Daughter   . Colon cancer Neg Hx    Allergies  Allergen Reactions  . Statins Other (See Comments)    Leg weakness   Current Outpatient Prescriptions on File Prior to Visit  Medication Sig Dispense Refill  . amLODipine-benazepril (LOTREL) 5-20 MG per capsule Take 1 capsule by mouth daily. 90 capsule 3  . aspirin EC 81 MG tablet Take 81 mg by mouth daily.    . calcium carbonate (OS-CAL) 600 MG TABS Take 600 mg by mouth 2 (two) times daily with a meal.    . Cholecalciferol 2000 UNITS TABS Take 2,000 Units by mouth daily.    Marland Kitchen levothyroxine (SYNTHROID, LEVOTHROID) 112 MCG tablet Take 1 tablet (112 mcg total) by mouth daily before breakfast. 90 tablet 1  . Multiple Vitamin (MULTIVITAMIN WITH MINERALS) TABS tablet Take 1 tablet by mouth daily.    Marland Kitchen  naproxen (NAPROSYN) 500 MG tablet Take 1 tablet (500 mg total) by mouth as needed for moderate pain or headache. As needed for pain 60 tablet 2  . zolpidem (AMBIEN) 5 MG tablet Take 1 tablet (5 mg total) by mouth at bedtime as needed for sleep. 30 tablet 1   No current facility-administered medications on file prior to visit.    Review of Systems  Constitutional: Positive for appetite change and fatigue. Negative for fever.  HENT: Positive for congestion, ear pain, postnasal drip, rhinorrhea, sinus pressure, sneezing and sore throat. Negative for ear discharge.   Eyes: Negative for pain and discharge.  Respiratory: Positive for cough. Negative for shortness of breath, wheezing and stridor.   Cardiovascular: Negative for chest pain.  Gastrointestinal: Negative for nausea, vomiting and diarrhea.  Genitourinary: Negative for urgency, frequency and hematuria.  Musculoskeletal: Negative for myalgias and  arthralgias.  Skin: Negative for rash.  Neurological: Positive for headaches. Negative for dizziness, weakness and light-headedness.  Psychiatric/Behavioral: Negative for confusion and dysphoric mood.       Objective:   Physical Exam  Constitutional: She appears well-developed and well-nourished. No distress.  HENT:  Head: Normocephalic and atraumatic.  Mouth/Throat: Oropharynx is clear and moist.  Nares are injected and congested  No sinus tenderness Clear rhinorrhea and post nasal drip   TMs are dull  L TM is mildly retracted-no redness or bulging   No temporal tenderness   Eyes: Conjunctivae and EOM are normal. Pupils are equal, round, and reactive to light. Right eye exhibits no discharge. Left eye exhibits no discharge.  Neck: Normal range of motion. Neck supple.  Cardiovascular: Normal rate and normal heart sounds.   Pulmonary/Chest: Effort normal and breath sounds normal. No respiratory distress. She has no wheezes. She has no rales. She exhibits no tenderness.  Harsh bs with scattered rhonchi   Lymphadenopathy:    She has no cervical adenopathy.  Neurological: She is alert.  Skin: Skin is warm and dry. No rash noted.  Psychiatric: She has a normal mood and affect.          Assessment & Plan:   Problem List Items Addressed This Visit      Respiratory   Viral URI with cough - Primary    In pt with hx of recurrent pneumonia and severe bronchitis (per pt and family) Also L ear intermittent pain - suspect due to ETD Will cover for CAP with zpak flonase for ear   If no imp in ear pain -enc to let us know- disc remote poss of trigeminal neuralgia or other disorder / doubtful however  Disc symptomatic care - see instructions on AVS  Hycodan for cough prn - will fill if needed with warning of sedation (do not take with ambien)  Update if not starting to improve in a week or if worsening        Relevant Medications   azithromycin (ZITHROMAX Z-PAK) 250 MG tablet

## 2015-04-14 NOTE — Patient Instructions (Signed)
Drink lots of fluids and rest  Take the zpak as directed  Get flonase over the counter and use 2 sprays in each nostril once daily to help clear the eustacian tube (for ear pain)  If ear pain persists beyond the illness-then follow up with your regular doctor For cough- if needed- fill the hycodan

## 2015-04-14 NOTE — Progress Notes (Signed)
Pre visit review using our clinic review tool, if applicable. No additional management support is needed unless otherwise documented below in the visit note. 

## 2015-04-15 NOTE — Assessment & Plan Note (Signed)
In pt with hx of recurrent pneumonia and severe bronchitis (per pt and family) Also L ear intermittent pain - suspect due to ETD Will cover for CAP with zpak flonase for ear   If no imp in ear pain -enc to let us know- disc remote poss of trigeminal neuralgia or other disorder / doubtful however  Disc symptomatic care - see instructions on AVS  Hycodan for cough prn - will fill if needed with warning of sedation (do not take with ambien)  Update if not starting to improve in a week or if worsening

## 2015-04-23 ENCOUNTER — Other Ambulatory Visit: Payer: Self-pay

## 2015-04-23 MED ORDER — LEVOTHYROXINE SODIUM 112 MCG PO TABS
112.0000 ug | ORAL_TABLET | Freq: Every day | ORAL | Status: DC
Start: 2015-04-23 — End: 2015-07-18

## 2015-04-30 ENCOUNTER — Encounter: Payer: Self-pay | Admitting: Internal Medicine

## 2015-05-03 ENCOUNTER — Other Ambulatory Visit: Payer: Self-pay | Admitting: Physician Assistant

## 2015-05-03 DIAGNOSIS — D485 Neoplasm of uncertain behavior of skin: Secondary | ICD-10-CM | POA: Diagnosis not present

## 2015-05-03 DIAGNOSIS — L98499 Non-pressure chronic ulcer of skin of other sites with unspecified severity: Secondary | ICD-10-CM | POA: Diagnosis not present

## 2015-05-03 DIAGNOSIS — L570 Actinic keratosis: Secondary | ICD-10-CM | POA: Diagnosis not present

## 2015-05-21 ENCOUNTER — Ambulatory Visit: Payer: Medicare Other | Admitting: Internal Medicine

## 2015-06-05 ENCOUNTER — Other Ambulatory Visit: Payer: Self-pay | Admitting: Internal Medicine

## 2015-06-05 NOTE — Telephone Encounter (Signed)
Faxed script back top [facility]...Johny Chess

## 2015-06-06 ENCOUNTER — Other Ambulatory Visit: Payer: Self-pay | Admitting: Internal Medicine

## 2015-06-07 NOTE — Telephone Encounter (Signed)
Sent to pharmacy 

## 2015-06-21 ENCOUNTER — Encounter: Payer: Self-pay | Admitting: Internal Medicine

## 2015-06-21 ENCOUNTER — Other Ambulatory Visit (INDEPENDENT_AMBULATORY_CARE_PROVIDER_SITE_OTHER): Payer: Medicare Other

## 2015-06-21 ENCOUNTER — Ambulatory Visit (INDEPENDENT_AMBULATORY_CARE_PROVIDER_SITE_OTHER): Payer: Medicare Other | Admitting: Internal Medicine

## 2015-06-21 VITALS — BP 138/62 | HR 76 | Temp 98.1°F | Resp 12 | Ht 68.0 in | Wt 163.0 lb

## 2015-06-21 DIAGNOSIS — E039 Hypothyroidism, unspecified: Secondary | ICD-10-CM | POA: Diagnosis not present

## 2015-06-21 LAB — TSH: TSH: 0.19 u[IU]/mL — ABNORMAL LOW (ref 0.35–4.50)

## 2015-06-21 LAB — T4, FREE: Free T4: 1.31 ng/dL (ref 0.60–1.60)

## 2015-06-21 MED ORDER — ZOLPIDEM TARTRATE 10 MG PO TABS: 10.0000 mg | ORAL_TABLET | Freq: Every evening | ORAL | Status: DC | PRN

## 2015-06-21 NOTE — Progress Notes (Signed)
Pre visit review using our clinic review tool, if applicable. No additional management support is needed unless otherwise documented below in the visit note. 

## 2015-06-21 NOTE — Progress Notes (Signed)
   Subjective:    Patient ID: Brittney Gates, female    DOB: 05/22/1924, 82 y.o.   MRN: [id number]  HPI The patient is an 82 YO female coming in for follow up of her thyroid. She had medication change several months ago. She is feeling fine with the decreased synthroid. No tremors, diarrhea, constipation. Denies heat or cold intolerance. Suffers with her chronic joint pains and leg pains from her low back.   Review of Systems  Constitutional: Negative for fever, activity change, appetite change, fatigue and unexpected weight change.  Respiratory: Negative.   Cardiovascular: Negative.   Gastrointestinal: Negative.   Endocrine: Negative for cold intolerance, heat intolerance, polydipsia, polyphagia and polyuria.  Musculoskeletal: Positive for back pain, arthralgias and gait problem.  Skin: Negative.       Objective:   Physical Exam  Constitutional: She is oriented to person, place, and time. She appears well-developed and well-nourished.  HENT:  Head: Normocephalic and atraumatic.  Eyes: EOM are normal.  Neck: Normal range of motion.  Cardiovascular: Normal rate and regular rhythm.   Pulmonary/Chest: Effort normal and breath sounds normal. No respiratory distress. She has no wheezes. She has no rales.  Abdominal: Soft. She exhibits no distension. There is no tenderness.  Neurological: She is alert and oriented to person, place, and time. Coordination abnormal.  Uses quad cane for ambulation  Skin: Skin is warm and dry.   Filed Vitals:   06/21/15 1403  BP: 138/62  Pulse: 76  Temp: 98.1 F (36.7 C)  TempSrc: Oral  Resp: 12  Height: 5\' 8"  (1.727 m)  Weight: 163 lb (73.936 kg)  SpO2: 98%      Assessment & Plan:

## 2015-06-21 NOTE — Patient Instructions (Signed)
We are checking the labs today for the thyroid.   We have sent in the ambien 10 mg to your pharmacy.   Come back in the fall for your wellness visit and please feel free to call us sooner if needed.

## 2015-06-21 NOTE — Assessment & Plan Note (Signed)
Last visit decrease from 122 mcg synthroid to 112 mcg synthroid. Checking TSH and free T4 and adjust as needed.

## 2015-07-06 ENCOUNTER — Ambulatory Visit (INDEPENDENT_AMBULATORY_CARE_PROVIDER_SITE_OTHER): Payer: Medicare Other | Admitting: Cardiovascular Disease

## 2015-07-06 ENCOUNTER — Encounter: Payer: Self-pay | Admitting: Cardiovascular Disease

## 2015-07-06 VITALS — BP 130/60 | HR 76 | Ht 68.0 in | Wt 162.4 lb

## 2015-07-06 DIAGNOSIS — E785 Hyperlipidemia, unspecified: Secondary | ICD-10-CM

## 2015-07-06 DIAGNOSIS — R011 Cardiac murmur, unspecified: Secondary | ICD-10-CM

## 2015-07-06 DIAGNOSIS — I10 Essential (primary) hypertension: Secondary | ICD-10-CM | POA: Diagnosis not present

## 2015-07-06 NOTE — Progress Notes (Signed)
Cardiology Office Note Date:  07/06/2015   ID:  Brittney Gates, DOB 1925/03/05, MRN [id number]  PCP:  [name], MD  Cardiologist:  Sherren Mocha, MD    Chief Complaint  Patient presents with  . Hypertension    has had cp off and on, no sob, le edema, or claudication     History of Present Illness: Brittney Gates is a 82 y.o. female who presents for cardiology follow-up evaluation. The patient was last seen in June 2016. She has been seen in the past for chest discomfort. She complains of a feeling of "tiredness" in her chest that occurs at rest. This is been long-standing. She had a nuclear scan in 2012 which was negative for ischemia and showed normal LV function. She has not had any chest pain or pressure associated with physical activity. She denies shortness of breath, edema, or heart palpitations. She does have fatigue. She has a lot of complaints related to her legs with pain and numbness as well as weakness. These are long-standing without recent change.   Past Medical History  Diagnosis Date  . LOW BACK PAIN, CHRONIC   . Diverticulosis   . ANEMIA   . ANXIETY   . Arthritis   . HYPERTENSION   . HYPERLIPIDEMIA   . HYPOTHYROIDISM   . PAD (peripheral artery disease) ([place])   . Hiatal hernia   . Gastritis   . Hemorrhoids   . Acute pancreatitis   . Chronic anemia     Past Surgical History  Procedure Laterality Date  . Back surgery      x's 2  . Hemorrhoid surgery    . Angioplasty  1995  . Tubal ligation    . Breast surgery      Benign  . Appendectomy    . Femur im nail  01/21/2012    Procedure: INTRAMEDULLARY (IM) NAIL FEMORAL;  Surgeon: Newt Minion, MD;  Location: [facility];  Service: Orthopedics;  Laterality: Left;  Intertrochanteric Nail Left Hip    Current Outpatient Prescriptions  Medication Sig Dispense Refill  . amLODipine-benazepril (LOTREL) 5-20 MG per capsule Take 1 capsule by mouth daily. 90 capsule 3  . aspirin EC 81 MG tablet Take 81 mg by  mouth daily.    . calcium carbonate (OS-CAL) 600 MG TABS Take 600 mg by mouth 2 (two) times daily with a meal.    . Cholecalciferol 2000 UNITS TABS Take 2,000 Units by mouth daily.    Marland Kitchen levothyroxine (SYNTHROID, LEVOTHROID) 112 MCG tablet Take 1 tablet (112 mcg total) by mouth daily before breakfast. 90 tablet 1  . Multiple Vitamin (MULTIVITAMIN WITH MINERALS) TABS tablet Take 1 tablet by mouth daily.    . naproxen (NAPROSYN) 500 MG tablet Take 1 tablet (500 mg total) by mouth as needed for moderate pain or headache. As needed for pain 60 tablet 2  . zolpidem (AMBIEN) 10 MG tablet Take 1 tablet (10 mg total) by mouth at bedtime as needed for sleep. 30 tablet 5   No current facility-administered medications for this visit.    Allergies:   Statins   Social History:  The patient  reports that she has never smoked. She does not have any smokeless tobacco history on file. She reports that she does not drink alcohol or use illicit drugs.   Family History:  The patient's  family history includes Gallbladder disease in her daughter and son; Lung cancer in her father. There is no history of Colon cancer.  ROS:  Please see the history of present illness.  Otherwise, review of systems is positive for leg numbness, muscle pain, easy bruising.  All other systems are reviewed and negative.    PHYSICAL EXAM: VS:  BP 130/60 mmHg  Pulse 76  Ht 5\' 8"  (1.727 m)  Wt 162 lb 6.4 oz (73.664 kg)  BMI 24.70 kg/m2 , BMI Body mass index is 24.7 kg/(m^2). GEN: Well nourished, well developed,pleasant elderly woman in no acute distress HEENT: normal Neck: no JVD, no masses. No carotid bruits Cardiac: RRR with 2/6 systolic murmur at the LLSB               Respiratory:  clear to auscultation bilaterally, normal work of breathing GI: soft, nontender, nondistended, + BS MS: no deformity or atrophy Ext: no pretibial edema, pedal pulses 2+= bilaterally Skin: warm and dry, no rash Neuro:  Strength and sensation are  intact Psych: euthymic mood, full affect  EKG:  EKG is ordered today. The ekg ordered today shows normal sinus rhythm 66 bpm, nonspecific T wave abnormality.  Recent Labs: 10/30/2014: ALT 14; BUN 21; Creatinine, Ser 0.84; Hemoglobin 10.8*; Platelets 274.0; Potassium 4.7; Sodium 140 06/21/2015: TSH 0.19*   Lipid Panel     Component Value Date/Time   CHOL 196 10/30/2014 1208   TRIG 147.0 10/30/2014 1208   TRIG 120 03/29/2010   HDL 51.00 10/30/2014 1208   CHOLHDL 4 10/30/2014 1208   VLDL 29.4 10/30/2014 1208   LDLCALC 116* 10/30/2014 1208   LDLDIRECT 143.1 12/08/2012 1223   Wt Readings from Last 3 Encounters:  07/06/15 162 lb 6.4 oz (73.664 kg)  06/21/15 163 lb (73.936 kg)  04/14/15 160 lb (72.576 kg)    Cardiac Studies Reviewed: Myoview 2012: Nuclear Scan 04/03/2010: QPS Raw Data Images: Normal; no motion artifact; normal heart/lung ratio. Stress Images: Normal homogeneous uptake in all areas of the myocardium. Rest Images: Normal homogeneous uptake in all areas of the myocardium. Subtraction (SDS): No evidence of ischemia. Transient Ischemic Dilatation: 1.14 (Normal <1.22) Lung/Heart Ratio: 0.41 (Normal <0.45)  Quantitative Gated Spect Images QGS EDV: 92 ml QGS ESV: 40 ml QGS EF: 57 % QGS cine images: No wall motion abnormalities.  Findings Normal nuclear study  ASSESSMENT AND PLAN: 1.  Chest pain with atypical features: No change over several years. Nuclear testing in the past is been negative for ischemia. Continue observation.  2. Essential hypertension: Blood pressure is well controlled on amlodipine/benazepril  3. Cardiac murmur: Likely benign, but I recommended a 2-D echocardiogram to better assess the etiology of her heart murmur.  Current medicines are reviewed with the patient today.  The patient does not have concerns regarding medicines.  Labs/ tests ordered today include:  No orders of the defined types were placed in this encounter.     Disposition:   FU one year  Signed, Sherren Mocha, MD  07/06/2015 3:21 PM    [facility] [address] [place], [place], [place]  [address] Phone: [phone]; Fax: [phone]

## 2015-07-06 NOTE — Patient Instructions (Signed)

## 2015-07-18 ENCOUNTER — Other Ambulatory Visit: Payer: Self-pay | Admitting: Internal Medicine

## 2015-07-26 ENCOUNTER — Ambulatory Visit (HOSPITAL_COMMUNITY): Payer: Medicare Other | Attending: Cardiology

## 2015-07-26 ENCOUNTER — Other Ambulatory Visit: Payer: Self-pay

## 2015-07-26 DIAGNOSIS — I119 Hypertensive heart disease without heart failure: Secondary | ICD-10-CM | POA: Insufficient documentation

## 2015-07-26 DIAGNOSIS — R011 Cardiac murmur, unspecified: Secondary | ICD-10-CM | POA: Diagnosis not present

## 2015-07-26 DIAGNOSIS — I77810 Thoracic aortic ectasia: Secondary | ICD-10-CM | POA: Insufficient documentation

## 2015-07-26 DIAGNOSIS — I340 Nonrheumatic mitral (valve) insufficiency: Secondary | ICD-10-CM | POA: Diagnosis not present

## 2015-07-26 DIAGNOSIS — E785 Hyperlipidemia, unspecified: Secondary | ICD-10-CM | POA: Diagnosis not present

## 2015-08-28 ENCOUNTER — Telehealth: Payer: Self-pay | Admitting: Gastroenterology

## 2015-08-28 NOTE — Telephone Encounter (Signed)
Left message on machine to call back  

## 2015-08-28 NOTE — Telephone Encounter (Signed)
Patient states that she is having abd pain and constipation. patient has an appt in July but she feels like she needs to be seen sooner. Appt offered for this week with PA but she had lack of transportation. She is requesting to be seen sooner. Please advise as to urgency of scheduling. Thanks!

## 2015-08-30 ENCOUNTER — Ambulatory Visit: Payer: Medicare Other | Admitting: Gastroenterology

## 2015-08-30 NOTE — Telephone Encounter (Signed)
Left message on machine to call back  

## 2015-08-31 NOTE — Telephone Encounter (Signed)
Left message on machine to call back I will wait for further communication from the pt

## 2015-10-03 ENCOUNTER — Ambulatory Visit: Payer: Medicare Other | Admitting: Gastroenterology

## 2015-10-05 ENCOUNTER — Other Ambulatory Visit: Payer: Self-pay | Admitting: Internal Medicine

## 2015-10-05 ENCOUNTER — Encounter: Payer: Self-pay | Admitting: Internal Medicine

## 2015-10-05 ENCOUNTER — Other Ambulatory Visit (INDEPENDENT_AMBULATORY_CARE_PROVIDER_SITE_OTHER): Payer: Medicare Other

## 2015-10-05 ENCOUNTER — Ambulatory Visit (INDEPENDENT_AMBULATORY_CARE_PROVIDER_SITE_OTHER): Payer: Medicare Other | Admitting: Internal Medicine

## 2015-10-05 VITALS — BP 158/82 | HR 78 | Temp 98.7°F | Resp 16 | Ht 68.0 in | Wt 161.3 lb

## 2015-10-05 DIAGNOSIS — E039 Hypothyroidism, unspecified: Secondary | ICD-10-CM | POA: Diagnosis not present

## 2015-10-05 DIAGNOSIS — R1011 Right upper quadrant pain: Secondary | ICD-10-CM | POA: Diagnosis not present

## 2015-10-05 LAB — COMPREHENSIVE METABOLIC PANEL
ALT: 11 U/L (ref 0–35)
AST: 16 U/L (ref 0–37)
Albumin: 4.2 g/dL (ref 3.5–5.2)
Alkaline Phosphatase: 61 U/L (ref 39–117)
BUN: 26 mg/dL — ABNORMAL HIGH (ref 6–23)
CO2: 29 mEq/L (ref 19–32)
Calcium: 9.6 mg/dL (ref 8.4–10.5)
Chloride: 102 mEq/L (ref 96–112)
Creatinine, Ser: 0.95 mg/dL (ref 0.40–1.20)
GFR: 58.87 mL/min — ABNORMAL LOW (ref >60.00)
Glucose, Bld: 102 mg/dL — ABNORMAL HIGH (ref 70–99)
Potassium: 4.3 mEq/L (ref 3.5–5.1)
Sodium: 136 mEq/L (ref 135–145)
Total Bilirubin: 0.8 mg/dL (ref 0.2–1.2)
Total Protein: 7.2 g/dL (ref 6.0–8.3)

## 2015-10-05 LAB — CBC
HCT: 31 % — ABNORMAL LOW (ref 36.0–46.0)
Hemoglobin: 10.1 g/dL — ABNORMAL LOW (ref 12.0–15.0)
MCHC: 32.5 g/dL (ref 30.0–36.0)
MCV: 103.7 fl — ABNORMAL HIGH (ref 78.0–100.0)
Platelets: 256 10*3/uL (ref 150.0–400.0)
RBC: 2.99 Mil/uL — ABNORMAL LOW (ref 3.87–5.11)
RDW: 23.8 % — ABNORMAL HIGH (ref 11.5–15.5)
WBC: 11.5 10*3/uL — ABNORMAL HIGH (ref 4.0–10.5)

## 2015-10-05 LAB — T4, FREE: Free T4: 1.43 ng/dL (ref 0.60–1.60)

## 2015-10-05 LAB — LIPASE: Lipase: 13 U/L (ref 11.0–59.0)

## 2015-10-05 LAB — TSH: TSH: 0.1 u[IU]/mL — ABNORMAL LOW (ref 0.35–4.50)

## 2015-10-05 MED ORDER — LEVOTHYROXINE SODIUM 100 MCG PO TABS: 100.0000 ug | ORAL_TABLET | Freq: Every day | ORAL | Status: DC

## 2015-10-05 NOTE — Progress Notes (Signed)
   Subjective:    Patient ID: Brittney Gates, female    DOB: [birth year], 82 y.o.   MRN: [id number]  HPI The patient is an 82 YO female coming in for RUQ pain in her stomach. Going on for several months and overall worsening. She does have small hiatal hernia on imaging of the chest. Has prior history of pancreatitis without evidence of gallstones in 2015. She will get an episode with eating out or certain meals. She denies heartburn or acid burning pain. No diarrhea or constipation. No blood in her stool. No pain with bending or lifting. Pain is 5/10 at maximum and lasts sometimes hours after starting.   Review of Systems  Constitutional: Negative for fever, activity change, appetite change, fatigue and unexpected weight change.  Respiratory: Negative.   Cardiovascular: Negative.   Gastrointestinal: Positive for abdominal pain. Negative for nausea, vomiting, diarrhea, constipation, blood in stool and abdominal distention.  Endocrine: Negative for cold intolerance, heat intolerance, polydipsia, polyphagia and polyuria.  Musculoskeletal: Positive for back pain, arthralgias and gait problem.  Skin: Negative.       Objective:   Physical Exam  Constitutional: She is oriented to person, place, and time. She appears well-developed and well-nourished.  HENT:  Head: Normocephalic and atraumatic.  Eyes: EOM are normal.  Neck: Normal range of motion.  Cardiovascular: Normal rate and regular rhythm.   Pulmonary/Chest: Effort normal and breath sounds normal. No respiratory distress. She has no wheezes. She has no rales.  Abdominal: Soft. Bowel sounds are normal. She exhibits no distension and no mass. There is no tenderness. There is no rebound and no guarding.  Neurological: She is alert and oriented to person, place, and time. Coordination abnormal.  Uses quad cane for ambulation  Skin: Skin is warm and dry.   Filed Vitals:   10/05/15 0958  BP: 158/82  Pulse: 78  Temp: 98.7 F (37.1 C)    TempSrc: Oral  Resp: 16  Height: 5\' 8"  (1.727 m)  Weight: 161 lb 4.8 oz (73.165 kg)  SpO2: 98%      Assessment & Plan:

## 2015-10-05 NOTE — Assessment & Plan Note (Signed)
Checking CBC, CMP, lipase. Also checking CT abdomen with contrast to rule out diverticulitis since she does have those. Possibility is gallstones given the history. Last [place] 2015 with some distention but no stones.

## 2015-10-05 NOTE — Patient Instructions (Signed)
We will check the blood work today and get a CT scan of the stomach as well to check for gallbladder and diverticular problem.   Low-Fat Diet for Pancreatitis or Gallbladder Conditions A low-fat diet can be helpful if you have pancreatitis or a gallbladder condition. With these conditions, your pancreas and gallbladder have trouble digesting fats. A healthy eating plan with less fat will help rest your pancreas and gallbladder and reduce your symptoms. WHAT DO I NEED TO KNOW ABOUT THIS DIET?  Eat a low-fat diet.  Reduce your fat intake to less than 20-30% of your total daily calories. This is less than 50-60 g of fat per day.  Remember that you need some fat in your diet. Ask your dietician what your daily goal should be.  Choose nonfat and low-fat healthy foods. Look for the words "nonfat," "low fat," or "fat free."  As a guide, look on the label and choose foods with less than 3 g of fat per serving. Eat only one serving.  Avoid alcohol.  Do not smoke. If you need help quitting, talk with your health care provider.  Eat small frequent meals instead of three large heavy meals. WHAT FOODS CAN I EAT? Grains Include healthy grains and starches such as potatoes, wheat bread, fiber-rich cereal, and brown rice. Choose whole grain options whenever possible. In adults, whole grains should account for 45-65% of your daily calories.  Fruits and Vegetables Eat plenty of fruits and vegetables. Fresh fruits and vegetables add fiber to your diet. Meats and Other Protein Sources Eat lean meat such as chicken and pork. Trim any fat off of meat before cooking it. Eggs, fish, and beans are other sources of protein. In adults, these foods should account for 10-35% of your daily calories. Dairy Choose low-fat milk and dairy options. Dairy includes fat and protein, as well as calcium.  Fats and Oils Limit high-fat foods such as fried foods, sweets, baked goods, sugary drinks.  Other Creamy sauces and  condiments, such as mayonnaise, can add extra fat. Think about whether or not you need to use them, or use smaller amounts or low fat options. WHAT FOODS ARE NOT RECOMMENDED?  High fat foods, such as:  [employer].  Ice cream.  [place] toast.  Sweet rolls.  Pizza.  Cheese bread.  Foods covered with batter, butter, creamy sauces, or cheese.  Fried foods.  Sugary drinks and desserts.  Foods that cause gas or bloating   This information is not intended to replace advice given to you by your health care provider. Make sure you discuss any questions you have with your health care provider.   Document Released: 03/22/2013 Document Reviewed: 03/22/2013 Elsevier Interactive Patient Education [employer].

## 2015-10-05 NOTE — Progress Notes (Signed)
Pre visit review using our clinic review tool, if applicable. No additional management support is needed unless otherwise documented below in the visit note. 

## 2015-10-08 ENCOUNTER — Telehealth: Payer: Self-pay | Admitting: Emergency Medicine

## 2015-10-08 ENCOUNTER — Telehealth: Payer: Self-pay

## 2015-10-08 NOTE — Telephone Encounter (Signed)
Left message for patient to call back to discuss results of CT.

## 2015-10-08 NOTE — Telephone Encounter (Signed)
Patient called to follow up on Ct Scan. Please give her a call back thanks.

## 2015-10-08 NOTE — Telephone Encounter (Signed)
Patient called back. I informed her of her labs. And the new medication. She understood. She is still waiting on a call back about her CT results. Please follow up, Thank you.

## 2015-10-08 NOTE — Telephone Encounter (Signed)
Patients son called in. States his mother wants her labs printed off. I printed them off and up front waiting.

## 2015-10-15 ENCOUNTER — Ambulatory Visit
Admission: RE | Admit: 2015-10-15 | Discharge: 2015-10-15 | Disposition: A | Discharge status: Home / Self Care | Payer: Medicare Other | Source: Ambulatory Visit | Attending: Internal Medicine | Admitting: Internal Medicine

## 2015-10-15 DIAGNOSIS — R1011 Right upper quadrant pain: Secondary | ICD-10-CM

## 2015-10-15 MED ORDER — IOPAMIDOL (ISOVUE-300) INJECTION 61%
100.0000 mL | Freq: Once | INTRAVENOUS | Status: AC | PRN
  Administered 2015-10-15: 100 mL via INTRAVENOUS

## 2015-10-18 ENCOUNTER — Telehealth: Payer: Self-pay | Admitting: Emergency Medicine

## 2015-10-18 NOTE — Telephone Encounter (Signed)
Copy placed up front in cabinet. Patient aware.

## 2015-10-18 NOTE — Telephone Encounter (Signed)
Pt called and wanted to know if she can get a copy of her scan she had done. Please follow up thanks.

## 2015-11-01 ENCOUNTER — Encounter: Payer: Self-pay | Admitting: Internal Medicine

## 2015-11-01 ENCOUNTER — Telehealth: Payer: Self-pay | Admitting: Geriatric Medicine

## 2015-11-01 ENCOUNTER — Ambulatory Visit (INDEPENDENT_AMBULATORY_CARE_PROVIDER_SITE_OTHER): Payer: Medicare Other | Admitting: Internal Medicine

## 2015-11-01 DIAGNOSIS — Z0000 Encounter for general adult medical examination without abnormal findings: Secondary | ICD-10-CM | POA: Diagnosis not present

## 2015-11-01 MED ORDER — LEVOTHYROXINE SODIUM 100 MCG PO TABS: 100.0000 ug | ORAL_TABLET | Freq: Every day | ORAL | 0 refills | Status: DC

## 2015-11-01 NOTE — Assessment & Plan Note (Signed)
She will get flu shot this year later, aged out of colonoscopy and mammogram. Recent labs and CT reviewed with her and normal. Counseled about sun safety and mole surveillance as well as home safety. Given 10 year screening recommendations.

## 2015-11-01 NOTE — Progress Notes (Signed)
Pre visit review using our clinic review tool, if applicable. No additional management support is needed unless otherwise documented below in the visit note. 

## 2015-11-01 NOTE — Progress Notes (Signed)
   Subjective:    Patient ID: Brittney Gates, female    DOB: 1924-07-09, 82 y.o.   MRN: [id number]  HPI Here for medicare wellness, no new complaints. Please see A/P for status and treatment of chronic medical problems.   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 The patient's medical and social history - reviewed today no changes 2. Their use of alcohol, tobacco or illicit drugs 3. Their current medications and supplements 4. The patient's functional ability including ADL's, fall risks, home safety risks and hearing or visual impairment. 5. Diet and physical activities 6. Evidence for depression or mood disorders 7. Care team reviewed and updated (available in snapshot)  Review of Systems  Constitutional: Negative for activity change, appetite change, fatigue, fever and unexpected weight change.  HENT: Negative.   Eyes: Positive for discharge.       Watery eyes  Respiratory: Negative.   Cardiovascular: Negative.   Gastrointestinal: Negative for abdominal distention, blood in stool, constipation, diarrhea, nausea and vomiting.  Endocrine: Negative for cold intolerance, heat intolerance, polydipsia, polyphagia and polyuria.  Musculoskeletal: Positive for arthralgias, back pain and gait problem.  Skin: Negative.   Psychiatric/Behavioral: Negative.       Objective:   Physical Exam  Constitutional: She is oriented to person, place, and time. She appears well-developed and well-nourished.  HENT:  Head: Normocephalic and atraumatic.  Eyes: EOM are normal.  Neck:  Normal range of motion.  Cardiovascular: Normal rate and regular rhythm.   Pulmonary/Chest: Effort normal and breath sounds normal. No respiratory distress. She has no wheezes. She has no rales.  Abdominal: Soft. Bowel sounds are normal. She exhibits no distension and no mass. There is no tenderness. There is no rebound and no guarding.  Neurological: She is alert and oriented to person, place, and time. Coordination abnormal.  Uses quad cane for ambulation  Skin: Skin is warm and dry.   Vitals:   11/01/15 1433  BP: 128/60  Pulse: 78  Resp: 14  Temp: 97.7 F (36.5 C)  TempSrc: Oral  SpO2: 98%  Weight: 162 lb 6.4 oz (73.7 kg)  Height: 5\' 8"  (1.727 m)      Assessment & Plan:

## 2015-11-01 NOTE — Telephone Encounter (Signed)
Patient forgot to mention that she thinks levothyroxine is giving her problems with her legs. She wants to try to go back to taking the brand synthroid. Please advise, thanks.

## 2015-11-01 NOTE — Patient Instructions (Addendum)
We do not need blood work today and will not change the medicines.   Health Maintenance, Female Adopting a healthy lifestyle and getting preventive care can go a long way to promote health and wellness. Talk with your health care provider about what schedule of regular examinations is right for you. This is a good chance for you to check in with your provider about disease prevention and staying healthy. In between checkups, there are plenty of things you can do on your own. Experts have done a lot of research about which lifestyle changes and preventive measures are most likely to keep you healthy. Ask your health care provider for more information. WEIGHT AND DIET  Eat a healthy diet  Be sure to include plenty of vegetables, fruits, low-fat dairy products, and lean protein.  Do not eat a lot of foods high in solid fats, added sugars, or salt.  Get regular exercise. This is one of the most important things you can do for your health.  Most adults should exercise for at least 150 minutes each week. The exercise should increase your heart rate and make you sweat (moderate-intensity exercise).  Most adults should also do strengthening exercises at least twice a week. This is in addition to the moderate-intensity exercise.  Maintain a healthy weight  Body mass index (BMI) is a measurement that can be used to identify possible weight problems. It estimates body fat based on height and weight. Your health care provider can help determine your BMI and help you achieve or maintain a healthy weight.  For females 80 years of age and older:   A BMI below 18.5 is considered underweight.  A BMI of 18.5 to 24.9 is normal.  A BMI of 25 to 29.9 is considered overweight.  A BMI of 30 and above is considered obese.  Watch levels of cholesterol and blood lipids  You should start having your blood tested for lipids and cholesterol at 80 years of age, then have this test every 5 years.  You may need  to have your cholesterol levels checked more often if:  Your lipid or cholesterol levels are high.  You are older than 80 years of age.  You are at high risk for heart disease.  CANCER SCREENING   Lung Cancer  Lung cancer screening is recommended for adults 80-63 years old who are at high risk for lung cancer because of a history of smoking.  A yearly low-dose CT scan of the lungs is recommended for people who:  Currently smoke.  Have quit within the past 15 years.  Have at least a 30-pack-year history of smoking. A pack year is smoking an average of one pack of cigarettes a day for 1 year.  Yearly screening should continue until it has been 15 years since you quit.  Yearly screening should stop if you develop a health problem that would prevent you from having lung cancer treatment.  Breast Cancer  Practice breast self-awareness. This means understanding how your breasts normally appear and feel.  It also means doing regular breast self-exams. Let your health care provider know about any changes, no matter how small.  If you are in your 80s or 30s, you should have a clinical breast exam (CBE) by a health care provider every 1-3 years as part of a regular health exam.  If you are 80 or older, have a CBE every year. Also consider having a breast X-ray (mammogram) every year.  If you have a family history  of breast cancer, talk to your health care provider about genetic screening.  If you are at high risk for breast cancer, talk to your health care provider about having an MRI and a mammogram every year.  Breast cancer gene (BRCA) assessment is recommended for women who have family members with BRCA-related cancers. BRCA-related cancers include:  Breast.  Ovarian.  Tubal.  Peritoneal cancers.  Results of the assessment will determine the need for genetic counseling and BRCA1 and BRCA2 testing. Cervical Cancer Your health care provider may recommend that you be  screened regularly for cancer of the pelvic organs (ovaries, uterus, and vagina). This screening involves a pelvic examination, including checking for microscopic changes to the surface of your cervix (Pap test). You may be encouraged to have this screening done every 3 years, beginning at age 80.  For women ages 80-65, health care providers may recommend pelvic exams and Pap testing every 3 years, or they may recommend the Pap and pelvic exam, combined with testing for human papilloma virus (HPV), every 5 years. Some types of HPV increase your risk of cervical cancer. Testing for HPV may also be done on women of any age with unclear Pap test results.  Other health care providers may not recommend any screening for nonpregnant women who are considered low risk for pelvic cancer and who do not have symptoms. Ask your health care provider if a screening pelvic exam is right for you.  If you have had past treatment for cervical cancer or a condition that could lead to cancer, you need Pap tests and screening for cancer for at least 20 years after your treatment. If Pap tests have been discontinued, your risk factors (such as having a new sexual partner) need to be reassessed to determine if screening should resume. Some women have medical problems that increase the chance of getting cervical cancer. In these cases, your health care provider may recommend more frequent screening and Pap tests. Colorectal Cancer  This type of cancer can be detected and often prevented.  Routine colorectal cancer screening usually begins at 80 years of age and continues through 80 years of age.  Your health care provider may recommend screening at an earlier age if you have risk factors for colon cancer.  Your health care provider may also recommend using home test kits to check for hidden blood in the stool.  A small camera at the end of a tube can be used to examine your colon directly (sigmoidoscopy or colonoscopy).  This is done to check for the earliest forms of colorectal cancer.  Routine screening usually begins at age 80.  Direct examination of the colon should be repeated every 5-10 years through 80 years of age. However, you may need to be screened more often if early forms of precancerous polyps or small growths are found. Skin Cancer  Check your skin from head to toe regularly.  Tell your health care provider about any new moles or changes in moles, especially if there is a change in a mole's shape or color.  Also tell your health care provider if you have a mole that is larger than the size of a pencil eraser.  Always use sunscreen. Apply sunscreen liberally and repeatedly throughout the day.  Protect yourself by wearing long sleeves, pants, a wide-brimmed hat, and sunglasses whenever you are outside. HEART DISEASE, DIABETES, AND HIGH BLOOD PRESSURE   High blood pressure causes heart disease and increases the risk of stroke. High blood pressure is  more likely to develop in:  People who have blood pressure in the high end of the normal range (130-139/85-89 mm Hg).  People who are overweight or obese.  People who are African American.  If you are 11-85 years of age, have your blood pressure checked every 3-5 years. If you are 84 years of age or older, have your blood pressure checked every year. You should have your blood pressure measured twice--once when you are at a hospital or clinic, and once when you are not at a hospital or clinic. Record the average of the two measurements. To check your blood pressure when you are not at a hospital or clinic, you can use:  An automated blood pressure machine at a pharmacy.  A home blood pressure monitor.  If you are between 23 years and 12 years old, ask your health care provider if you should take aspirin to prevent strokes.  Have regular diabetes screenings. This involves taking a blood sample to check your fasting blood sugar level.  If you  are at a normal weight and have a low risk for diabetes, have this test once every three years after 80 years of age.  If you are overweight and have a high risk for diabetes, consider being tested at a younger age or more often. PREVENTING INFECTION  Hepatitis B  If you have a higher risk for hepatitis B, you should be screened for this virus. You are considered at high risk for hepatitis B if:  You were born in a country where hepatitis B is common. Ask your health care provider which countries are considered high risk.  Your parents were born in a high-risk country, and you have not been immunized against hepatitis B (hepatitis B vaccine).  You have HIV or AIDS.  You use needles to inject street drugs.  You live with someone who has hepatitis B.  You have had sex with someone who has hepatitis B.  You get hemodialysis treatment.  You take certain medicines for conditions, including cancer, organ transplantation, and autoimmune conditions. Hepatitis C  Blood testing is recommended for:  Everyone born from [birth year] through 1963.  Anyone with known risk factors for hepatitis C. Sexually transmitted infections (STIs)  You should be screened for sexually transmitted infections (STIs) including gonorrhea and chlamydia if:  You are sexually active and are younger than 80 years of age.  You are older than 80 years of age and your health care provider tells you that you are at risk for this type of infection.  Your sexual activity has changed since you were last screened and you are at an increased risk for chlamydia or gonorrhea. Ask your health care provider if you are at risk.  If you do not have HIV, but are at risk, it may be recommended that you take a prescription medicine daily to prevent HIV infection. This is called pre-exposure prophylaxis (PrEP). You are considered at risk if:  You are sexually active and do not regularly use condoms or know the HIV status of your  partner(s).  You take drugs by injection.  You are sexually active with a partner who has HIV. Talk with your health care provider about whether you are at high risk of being infected with HIV. If you choose to begin PrEP, you should first be tested for HIV. You should then be tested every 3 months for as long as you are taking PrEP.  PREGNANCY   If you are premenopausal and you  may become pregnant, ask your health care provider about preconception counseling.  If you may become pregnant, take 400 to 800 micrograms (mcg) of folic acid every day.  If you want to prevent pregnancy, talk to your health care provider about birth control (contraception). OSTEOPOROSIS AND MENOPAUSE   Osteoporosis is a disease in which the bones lose minerals and strength with aging. This can result in serious bone fractures. Your risk for osteoporosis can be identified using a bone density scan.  If you are 75 years of age or older, or if you are at risk for osteoporosis and fractures, ask your health care provider if you should be screened.  Ask your health care provider whether you should take a calcium or vitamin D supplement to lower your risk for osteoporosis.  Menopause may have certain physical symptoms and risks.  Hormone replacement therapy may reduce some of these symptoms and risks. Talk to your health care provider about whether hormone replacement therapy is right for you.  HOME CARE INSTRUCTIONS   Schedule regular health, dental, and eye exams.  Stay current with your immunizations.   Do not use any tobacco products including cigarettes, chewing tobacco, or electronic cigarettes.  If you are pregnant, do not drink alcohol.  If you are breastfeeding, limit how much and how often you drink alcohol.  Limit alcohol intake to no more than 1 drink per day for nonpregnant women. One drink equals 12 ounces of beer, 5 ounces of wine, or 1 ounces of hard liquor.  Do not use street drugs.  Do  not share needles.  Ask your health care provider for help if you need support or information about quitting drugs.  Tell your health care provider if you often feel depressed.  Tell your health care provider if you have ever been abused or do not feel safe at home.   This information is not intended to replace advice given to you by your health care provider. Make sure you discuss any questions you have with your health care provider.   Document Released: 09/30/2010 Document Revised: 04/07/2014 Document Reviewed: 02/16/2013 Elsevier Interactive Patient Education [employer].

## 2015-11-01 NOTE — Telephone Encounter (Signed)
Have sent in new rx for brand name only

## 2015-11-02 DIAGNOSIS — M722 Plantar fascial fibromatosis: Secondary | ICD-10-CM | POA: Diagnosis not present

## 2015-11-02 DIAGNOSIS — M5431 Sciatica, right side: Secondary | ICD-10-CM | POA: Diagnosis not present

## 2015-11-02 DIAGNOSIS — M5432 Sciatica, left side: Secondary | ICD-10-CM | POA: Diagnosis not present

## 2015-11-14 ENCOUNTER — Telehealth: Payer: Self-pay | Admitting: Cardiovascular Disease

## 2015-11-14 DIAGNOSIS — I739 Peripheral vascular disease, unspecified: Secondary | ICD-10-CM

## 2015-11-14 DIAGNOSIS — R29898 Other symptoms and signs involving the musculoskeletal system: Secondary | ICD-10-CM

## 2015-11-14 NOTE — Telephone Encounter (Signed)
Calling stating that for about the past week or so she has been having numbness in legs and feeling week.  Numbness is worse in (L) leg.  States she had an episode about a week ago of generalized weakness in chest and upper arms.  Describes as discomfort not pain that lasted about 20 min.  States since then she has experienced the weakness in legs.  States she tries to walk but due to the numbness and weakness she can not walk any distance. She is requesting to have an doppler of her legs.  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  Advised Dr. Burt Knack not in office today but will forward message to him and will call her back with his recommendations.

## 2015-11-14 NOTE — Telephone Encounter (Signed)
New Message  Pt call requesting to speak with RN about getting a sooner appt than avail. Pt states she had been dealing with circulation issues. Pt states she wants to come in to get her legs checked out. Pt state there is a burning and numbing sensation. Please call back to discuss

## 2015-11-15 NOTE — Telephone Encounter (Signed)
Dr Burt Knack please review previous messages as well.  I spoke with the pt today and she complains of a pulling and tightening in her left collar bone area that will release and then pull again.  This occurs at rest and not with movement. Today is the first time that she has experienced these symptoms and they have been off and on.  The pt does not have any associated CP or SOB. No changes were recommended at this time. Will continue with observation.

## 2015-11-15 NOTE — Telephone Encounter (Signed)
New message    Patient C/O tightness collar bone on left side.   ? No chest pain, No sob .   No PCP.

## 2015-11-15 NOTE — Telephone Encounter (Signed)
Ok to order ABI/LEA studies but she really should see her PCP. These do not sound like typical cardiovascular symptoms.

## 2015-11-16 NOTE — Telephone Encounter (Signed)
Order placed for LEA.  I spoke with the pt and made her aware that Dr Burt Knack did order an arterial study and a scheduler will contact the pt to arrange this test.  I advised the pt that she needs to contact her PCP in regards to other symptoms that she described yesterday.  The pt states that so far today she has not experienced any symptoms but she will contact PCP if it continues to occur. The pt was appreciative of my call.

## 2015-11-20 ENCOUNTER — Other Ambulatory Visit: Payer: Self-pay | Admitting: Cardiovascular Disease

## 2015-11-20 DIAGNOSIS — R29898 Other symptoms and signs involving the musculoskeletal system: Secondary | ICD-10-CM

## 2015-11-20 DIAGNOSIS — I739 Peripheral vascular disease, unspecified: Secondary | ICD-10-CM

## 2015-11-26 ENCOUNTER — Ambulatory Visit (HOSPITAL_COMMUNITY)
Admission: RE | Admit: 2015-11-26 | Discharge: 2015-11-26 | Disposition: A | Discharge status: Home / Self Care | Payer: Medicare Other | Source: Ambulatory Visit | Attending: Cardiovascular Disease | Admitting: Cardiovascular Disease

## 2015-11-26 DIAGNOSIS — I10 Essential (primary) hypertension: Secondary | ICD-10-CM | POA: Diagnosis not present

## 2015-11-26 DIAGNOSIS — E119 Type 2 diabetes mellitus without complications: Secondary | ICD-10-CM | POA: Insufficient documentation

## 2015-11-26 DIAGNOSIS — R938 Abnormal findings on diagnostic imaging of other specified body structures: Secondary | ICD-10-CM | POA: Insufficient documentation

## 2015-11-26 DIAGNOSIS — E785 Hyperlipidemia, unspecified: Secondary | ICD-10-CM | POA: Insufficient documentation

## 2015-11-26 DIAGNOSIS — R29898 Other symptoms and signs involving the musculoskeletal system: Secondary | ICD-10-CM | POA: Diagnosis not present

## 2015-11-26 DIAGNOSIS — R209 Unspecified disturbances of skin sensation: Secondary | ICD-10-CM | POA: Diagnosis not present

## 2015-11-26 DIAGNOSIS — I739 Peripheral vascular disease, unspecified: Secondary | ICD-10-CM

## 2015-11-27 ENCOUNTER — Telehealth: Payer: Self-pay | Admitting: Internal Medicine

## 2015-11-27 DIAGNOSIS — E039 Hypothyroidism, unspecified: Secondary | ICD-10-CM

## 2015-11-27 NOTE — Telephone Encounter (Signed)
Pt called stating in July we had her thyroid test and change med. Pt stating last week, she doesn't feel right in her system (nervourness). Pt was wondering if Dr. Sharlet Salina can order another thyroid test or recommend something else.

## 2015-11-27 NOTE — Telephone Encounter (Signed)
Patient aware and will go to the lab

## 2015-11-27 NOTE — Telephone Encounter (Signed)
She can come to the lab for the tests. If nothing is found recommend visit.

## 2015-11-28 ENCOUNTER — Other Ambulatory Visit (INDEPENDENT_AMBULATORY_CARE_PROVIDER_SITE_OTHER): Payer: Medicare Other

## 2015-11-28 DIAGNOSIS — E039 Hypothyroidism, unspecified: Secondary | ICD-10-CM

## 2015-11-28 LAB — TSH: TSH: 0.23 u[IU]/mL — ABNORMAL LOW (ref 0.35–4.50)

## 2015-11-28 LAB — T4, FREE: Free T4: 1.23 ng/dL (ref 0.60–1.60)

## 2015-12-03 DIAGNOSIS — Z23 Encounter for immunization: Secondary | ICD-10-CM | POA: Diagnosis not present

## 2015-12-14 ENCOUNTER — Telehealth: Payer: Self-pay | Admitting: *Deleted

## 2015-12-14 NOTE — Telephone Encounter (Signed)
Rec'd fax pt requesting refill on her Zolpidem...Johny Chess

## 2015-12-17 MED ORDER — ZOLPIDEM TARTRATE 10 MG PO TABS: 10.0000 mg | ORAL_TABLET | Freq: Every evening | ORAL | 5 refills | Status: DC | PRN

## 2015-12-17 NOTE — Telephone Encounter (Signed)
Printed and signed.  

## 2015-12-17 NOTE — Telephone Encounter (Signed)
Faxed script back to [facility].../lmb  

## 2016-01-10 ENCOUNTER — Ambulatory Visit (INDEPENDENT_AMBULATORY_CARE_PROVIDER_SITE_OTHER): Payer: Medicare Other | Admitting: Orthopedic Surgery

## 2016-01-10 DIAGNOSIS — M5442 Lumbago with sciatica, left side: Secondary | ICD-10-CM | POA: Diagnosis not present

## 2016-01-18 ENCOUNTER — Other Ambulatory Visit (INDEPENDENT_AMBULATORY_CARE_PROVIDER_SITE_OTHER): Payer: Self-pay | Admitting: Orthopedic Surgery

## 2016-01-18 DIAGNOSIS — M5136 Other intervertebral disc degeneration, lumbar region: Secondary | ICD-10-CM

## 2016-01-29 ENCOUNTER — Ambulatory Visit
Admission: RE | Admit: 2016-01-29 | Discharge: 2016-01-29 | Disposition: A | Discharge status: Home / Self Care | Payer: Medicare Other | Source: Ambulatory Visit | Attending: Orthopedic Surgery | Admitting: Orthopedic Surgery

## 2016-01-29 DIAGNOSIS — M48061 Spinal stenosis, lumbar region without neurogenic claudication: Secondary | ICD-10-CM | POA: Diagnosis not present

## 2016-01-29 DIAGNOSIS — M5136 Other intervertebral disc degeneration, lumbar region: Secondary | ICD-10-CM

## 2016-01-30 ENCOUNTER — Other Ambulatory Visit (INDEPENDENT_AMBULATORY_CARE_PROVIDER_SITE_OTHER): Payer: Self-pay

## 2016-01-30 DIAGNOSIS — G8929 Other chronic pain: Secondary | ICD-10-CM

## 2016-01-30 DIAGNOSIS — M5440 Lumbago with sciatica, unspecified side: Principal | ICD-10-CM

## 2016-02-05 ENCOUNTER — Encounter (INDEPENDENT_AMBULATORY_CARE_PROVIDER_SITE_OTHER): Payer: Self-pay | Admitting: Physical Medicine and Rehabilitation

## 2016-02-05 ENCOUNTER — Telehealth (INDEPENDENT_AMBULATORY_CARE_PROVIDER_SITE_OTHER): Payer: Self-pay | Admitting: Physical Medicine and Rehabilitation

## 2016-02-05 ENCOUNTER — Ambulatory Visit (INDEPENDENT_AMBULATORY_CARE_PROVIDER_SITE_OTHER): Payer: Medicare Other | Admitting: Physical Medicine and Rehabilitation

## 2016-02-05 VITALS — BP 142/65 | HR 62

## 2016-02-05 DIAGNOSIS — M961 Postlaminectomy syndrome, not elsewhere classified: Secondary | ICD-10-CM | POA: Diagnosis not present

## 2016-02-05 DIAGNOSIS — M48062 Spinal stenosis, lumbar region with neurogenic claudication: Secondary | ICD-10-CM

## 2016-02-05 DIAGNOSIS — M5416 Radiculopathy, lumbar region: Secondary | ICD-10-CM | POA: Diagnosis not present

## 2016-02-05 NOTE — Progress Notes (Signed)
Brittney Gates - 82 y.o. female MRN [id number]  Date of birth: 1924/07/26  Office Visit Note: Visit Date: 02/05/2016 PCP: Brittney Koch, MD Referred by: Brittney Gates, *  Subjective: Chief Complaint  Patient presents with  . Lower Back - Pain   HPI: Brittney Gates is an 82 year old female whom I last saw in 2014 and completed an epidural injection for more radicular leg pain. She has had prior lumbar surgery with L4-5 and L5-S1 instrumented fusion. She appears of also had laminectomy at L3-4. She's been under the care of Brittney Gates and Brittney Gates. She's been having more left than right but bilateral nerve type pain particularly with standing and ambulating. This been going on for some time. She has a history of hip fracture. She has not noted any focal weakness. She did have a fall at one point and did have a thoracic compression fracture which was treated with kyphoplasty. She's been having the ongoing symptoms now for several months. Her back pain has been present for years. The worst pain she has is really this nerve type pain in the legs. She also has some pain on the right greater trochanter worse with laying on that side. She has been using medications and she's been treated conservatively with Brittney Gates for a while. He ended up getting an MRI of the lumbar spine updated which is reviewed in the section below. She has not had any recent injections or spine surgery. She denies any specific groin pain.   Back Pain  The pain is present in the lumbar spine. The quality of the pain is described as burning. The pain is at a severity of 9/10. Pertinent negatives include no abdominal pain, chest pain, fever, weakness or weight loss.    Lower back and leg pain. Cramping in legs at night. Burning/ numbness in legs when walking. Left leg is worse than right.  Burning pain in lower back. Reports she does not have a "coccyx bone." Ankles and feet feel swollen when they are not. Knee pain present  also. Review of Systems  Constitutional: Negative for chills, fever, malaise/fatigue and weight loss.  HENT: Negative for hearing loss and sinus pain.   Eyes: Negative for blurred vision, double vision and photophobia.  Respiratory: Negative for cough and shortness of breath.   Cardiovascular: Negative for chest pain, palpitations and leg swelling.  Gastrointestinal: Negative for abdominal pain, nausea and vomiting.  Genitourinary: Negative for flank pain, frequency and urgency.  Musculoskeletal: Positive for back pain. Negative for myalgias.  Skin: Negative for itching and rash.  Neurological: Negative for tremors, focal weakness and weakness.  Endo/Heme/Allergies: Negative.   Psychiatric/Behavioral: Negative for depression.  All other systems reviewed and are negative.  Otherwise per HPI.  Assessment & Plan: Visit Diagnoses:  1. Spinal stenosis of lumbar region with neurogenic claudication   2. Post laminectomy syndrome   3. Lumbar radiculopathy     Plan: Findings:  Chronic history of low back and radicular pain complicated by prior hip fracture and now more of a neurogenic claudication in of the thighs. MRI shows what I feel is more moderate to severe central canal stenosis at L1-2 with bi-foraminal stenosis at L3-4. She has had a ongoing chronic pain syndrome for some time. I think the best approach is bilateral L2 transforaminal injection to get medication sort of to both levels. I would prefer the transforaminal approach just due to the bony anatomy. She does want to proceed with this. Depending on  how she does with that we could look at different medication trial or therapy for more of her back pain. I spent more than 25 minutes speaking face-to-face with the patient with 50% of the time in counseling.    Meds & Orders: No orders of the defined types were placed in this encounter.  No orders of the defined types were placed in this encounter.   Follow-up: Return for Schedule for  bilateral L2 transforaminal epidural steroid injection.   Procedures: No procedures performed  No notes on file   Clinical History: Lumbar spine MRI 01/29/2016  IMPRESSION: 1. Adjacent segment disease at L3-L4 with severe bilateral foraminal stenosis. 2. Multilevel mild-to-moderate foraminal narrowing, including at the postsurgical levels. 3. Moderate L1-L2 multifactorial spinal canal stenosis.*Upon reviewing the imaging I feel like this level is actually more than moderate stenosis. I would say moderate to severe   She reports that she has never smoked. She has never used smokeless tobacco. No results for input(s): HGBA1C, LABURIC in the last 8760 hours.  Objective:  VS:  HT:    WT:   BMI:     BP:(!) 142/65  HR:62bpm  TEMP: ( )  RESP:  Physical Exam  Constitutional: She is oriented to person, place, and time. She appears well-developed and well-nourished. No distress.  HENT:  Head: Normocephalic and atraumatic.  Nose: Nose normal.  Mouth/Throat: Oropharynx is clear and moist.  Eyes: Conjunctivae are normal. Pupils are equal, round, and reactive to light.  Neck: Normal range of motion. Neck supple.  Cardiovascular: Regular rhythm and intact distal pulses.   Pulmonary/Chest: Effort normal and breath sounds normal.  Abdominal: She exhibits no distension.  Musculoskeletal:  The patient ambulates with a cane in the right hand and is very slow to rise from a seated position. She does have some balance difficulties with ambulating. She does have a very stiff lumbar spine and some concordant pain with extension rotation. She does have pain over the right greater trochanter but no pain with hip rotation internal bilaterally. She has good distal strength without clonus.  Neurological: She is alert and oriented to person, place, and time.  Skin: Skin is warm.  Psychiatric: She has a normal mood and affect. Her behavior is normal.  Nursing note and vitals reviewed.   Ortho  Exam Imaging: No results found.  Past Medical/Family/Surgical/Social History: Medications & Allergies reviewed per EMR Patient Active Problem List   Diagnosis Date Noted  . Routine general medical examination at a health care facility 11/01/2015  . RUQ pain 10/05/2015  . Abnormal CT scan, chest 05/06/2012  . Osteoporosis 04/30/2012  . Hip fracture left 01/20/2012  . PVD (peripheral vascular disease) ([place])   . ANEMIA 04/24/2010  . Hypothyroidism 08/14/2007  . Hyperlipidemia 08/14/2007  . ANXIETY 08/14/2007  . Essential hypertension 08/14/2007  . ARTHRITIS 08/14/2007  . LOW BACK PAIN, CHRONIC 08/14/2007   Past Medical History:  Diagnosis Date  . Acute pancreatitis   . ANEMIA   . ANXIETY   . Arthritis   . Chronic anemia   . Diverticulosis   . Gastritis   . Hemorrhoids   . Hiatal hernia   . HYPERLIPIDEMIA   . HYPERTENSION   . HYPOTHYROIDISM   . LOW BACK PAIN, CHRONIC   . PAD (peripheral artery disease) ([facility])    Family History  Problem Relation Age of Onset  . Lung cancer Father   . Gallbladder disease Son   . Gallbladder disease Daughter   . Colon cancer Neg  Hx    Past Surgical History:  Procedure Laterality Date  . ANGIOPLASTY  1995  . APPENDECTOMY    . BACK SURGERY     x's 2  . BREAST SURGERY     Benign  . FEMUR IM NAIL  01/21/2012   Procedure: INTRAMEDULLARY (IM) NAIL FEMORAL;  Surgeon: Newt Minion, MD;  Location: [facility];  Service: Orthopedics;  Laterality: Left;  Intertrochanteric Nail Left Hip  . HEMORRHOID SURGERY    . TUBAL LIGATION     Social History   Occupational History  . Not on file.   Social History Main Topics  . Smoking status: Never Smoker  . Smokeless tobacco: Never Used  . Alcohol use No  . Drug use: No  . Sexual activity: Not on file

## 2016-02-07 NOTE — Telephone Encounter (Signed)
No precert required for Medicare and BCBS supplement

## 2016-02-12 ENCOUNTER — Ambulatory Visit (INDEPENDENT_AMBULATORY_CARE_PROVIDER_SITE_OTHER): Payer: Medicare Other | Admitting: Physical Medicine and Rehabilitation

## 2016-02-12 ENCOUNTER — Encounter (INDEPENDENT_AMBULATORY_CARE_PROVIDER_SITE_OTHER): Payer: Self-pay | Admitting: Physical Medicine and Rehabilitation

## 2016-02-12 ENCOUNTER — Institutional Professional Consult (permissible substitution) (INDEPENDENT_AMBULATORY_CARE_PROVIDER_SITE_OTHER): Payer: Medicare Other | Admitting: Physical Medicine and Rehabilitation

## 2016-02-12 VITALS — BP 161/74 | HR 65 | Temp 97.6°F

## 2016-02-12 DIAGNOSIS — M5416 Radiculopathy, lumbar region: Secondary | ICD-10-CM | POA: Diagnosis not present

## 2016-02-12 DIAGNOSIS — M48062 Spinal stenosis, lumbar region with neurogenic claudication: Secondary | ICD-10-CM | POA: Diagnosis not present

## 2016-02-12 MED ORDER — DEXAMETHASONE SODIUM PHOSPHATE 10 MG/ML IJ SOLN
15.0000 mg | Freq: Once | INTRAMUSCULAR | Status: AC
  Administered 2016-02-12: 15 mg

## 2016-02-12 MED ORDER — LIDOCAINE HCL (PF) 1 % IJ SOLN: 0.3300 mL | Freq: Once | INTRAMUSCULAR | Status: DC

## 2016-02-12 NOTE — Progress Notes (Signed)
Brittney Gates - 82 y.o. female MRN [id number]  Date of birth: 11/12/1924  Office Visit Note: Visit Date: 02/12/2016 PCP: [name], MD Referred by: [name], *  Subjective: Chief Complaint  Patient presents with  . Lower Back - Pain   HPI: Mrs. Brittney Gates is an 82 year old female with prior lumbar fusion now with bilateral foraminal stenosis at L3-4 above the fusion and moderate multifactorial stenosis at L1-2 below an area where she had compression fracture and kyphoplasty. She unfortunately does seem to have a lot of generalized anxiety and she really discuss with me at length today wanting to know how painful the shot was going to be in questioning whether she should just do one side because it may be too painful to do both sides. I tried to answer her questions today. We did review her symptoms and decided to complete an L3 transforaminal injection with a stenosis of the foramen is. I was going to [employer] go above this area based on anatomy but I think is probably fair to try the area where the stenosis in the foramen is. We will do the one side on the right and benefits problem for her we can defer the left.    ROS Otherwise per HPI.  Assessment & Plan: Visit Diagnoses:  1. Spinal stenosis of lumbar region with neurogenic claudication   2. Lumbar radiculopathy     Plan: Findings:  Again plan today was for possible bilateral L1 or L2 transforaminal epidural steroid injection. After talking with her we decided to complete a right L3 transforaminal injection with the foraminal narrowing is above the fusion. If she does well in the right side will go into the left side. She has a lot of anxiety today about pain in general.    Meds & Orders:  Meds ordered this encounter  Medications  . lidocaine (PF) (XYLOCAINE) 1 % injection 0.3 mL  . dexamethasone (DECADRON) injection 15 mg    Orders Placed This Encounter  Procedures  . Epidural Steroid injection      Follow-up: Return if symptoms worsen or fail to improve.   Procedures: No procedures performed  Lumbosacral Transforaminal Epidural Steroid Injection - Infraneural Approach with Fluoroscopic Guidance  Patient: Brittney Gates      Date of Birth: 05/11/1924 MRN: [id number] PCP: [name], MD      Visit Date: 02/12/2016   Universal Protocol:    Date/Time: 11/14/172:46 PM  Consent Given By: the patient  Position: PRONE   Additional Comments: Vital signs were monitored before and after the procedure. Patient was prepped and draped in the usual sterile fashion. The correct patient, procedure, and site was verified.   Injection Procedure Details:  Procedure Site One Meds Administered:  Meds ordered this encounter  Medications  . lidocaine (PF) (XYLOCAINE) 1 % injection 0.3 mL  . dexamethasone (DECADRON) injection 15 mg      Laterality: Bilateral  Location/Site:  L3-L4  Needle size: 22 G  Needle type: Spinal  Needle Placement: Transforaminal  Findings:  -Contrast Used: 1 mL iohexol 180 mg iodine/mL   -Comments: Excellent flow of contrast along the nerve and into the epidural space.   Every spine from anesthetizing the skin to knee length of the muscle to getting close of the foramen was just a anxiety ridden injection for her. She didn't let us finished. And then interestingly after was all over she seems to be just happy that it was over and was no  worse for wear at that point. In the future we may provide some mild sedation for the injection if we have to do it again.  Procedure Details: After squaring off the end-plates of the desired vertebral level to get a true AP view, the C-arm was obliqued to the painful side so that the superior articulating process is positioned about 1/3 the length of the inferior endplate.  The needle was aimed toward the junction of the superior articular process and the transverse process of the inferior vertebrae. The needle's  initial entry is in the lower third of the foramen through Kambin's triangle. The soft tissues overlying this target were infiltrated with 2-3 ml. of 1% Lidocaine without Epinephrine.  The spinal needle was then inserted and advanced toward the target using a "trajectory" view along the fluoroscope beam.  Under AP and lateral visualization, the needle was advanced so it did not puncture dura and did not traverse medially beyond the 6 o'clock position of the pedicle. Bi-planar projections were used to confirm position. Aspiration was confirmed to be negative for CSF and/or blood. A 1-2 ml. volume of Isovue-250 was injected and flow of contrast was noted at each level. Radiographs were obtained for documentation purposes.   After attaining the desired flow of contrast documented above, a 0.5 to 1.0 ml test dose of 0.25% Marcaine was injected into each respective transforaminal space.  The patient was observed for 90 seconds post injection.  After no sensory deficits were reported, and normal lower extremity motor function was noted,   the above injectate was administered so that equal amounts of the injectate were placed at each foramen (level) into the transforaminal epidural space.   Additional Comments:  The patient tolerated the procedure well Dressing: Band-Aid    Post-procedure details: Patient was observed during the procedure. Post-procedure instructions were reviewed.  Patient left the clinic in stable condition.     Clinical History: Lumbar spine MRI 01/29/2016  IMPRESSION: 1. Adjacent segment disease at L3-L4 with severe bilateral foraminal stenosis. 2. Multilevel mild-to-moderate foraminal narrowing, including at the postsurgical levels. 3. Moderate L1-L2 multifactorial spinal canal stenosis.*Upon reviewing the imaging I feel like this level is actually more than moderate stenosis. I would say moderate to severe  She reports that she has never smoked. She has never used smokeless  tobacco. No results for input(s): HGBA1C, LABURIC in the last 8760 hours.  Objective:  VS:  HT:    WT:   BMI:     BP:(!) 161/74  HR:65bpm  TEMP:97.6 F (36.4 C)( )  RESP:98 % Physical Exam  Ortho Exam Imaging: No results found.  Past Medical/Family/Surgical/Social History: Medications & Allergies reviewed per EMR Patient Active Problem List   Diagnosis Date Noted  . Routine general medical examination at a health care facility 11/01/2015  . RUQ pain 10/05/2015  . Abnormal CT scan, chest 05/06/2012  . Osteoporosis 04/30/2012  . Hip fracture left 01/20/2012  . PVD (peripheral vascular disease) ([place])   . ANEMIA 04/24/2010  . Hypothyroidism 08/14/2007  . Hyperlipidemia 08/14/2007  . ANXIETY 08/14/2007  . Essential hypertension 08/14/2007  . ARTHRITIS 08/14/2007  . LOW BACK PAIN, CHRONIC 08/14/2007   Past Medical History:  Diagnosis Date  . Acute pancreatitis   . ANEMIA   . ANXIETY   . Arthritis   . Chronic anemia   . Diverticulosis   . Gastritis   . Hemorrhoids   . Hiatal hernia   . HYPERLIPIDEMIA   . HYPERTENSION   . HYPOTHYROIDISM   .  LOW BACK PAIN, CHRONIC   . PAD (peripheral artery disease) ([facility])    Family History  Problem Relation Age of Onset  . Lung cancer Father   . Gallbladder disease Son   . Gallbladder disease Daughter   . Colon cancer Neg Hx    Past Surgical History:  Procedure Laterality Date  . ANGIOPLASTY  1995  . APPENDECTOMY    . BACK SURGERY     x's 2  . BREAST SURGERY     Benign  . FEMUR IM NAIL  01/21/2012   Procedure: INTRAMEDULLARY (IM) NAIL FEMORAL;  Surgeon: Newt Minion, MD;  Location: [place];  Service: Orthopedics;  Laterality: Left;  Intertrochanteric Nail Left Hip  . HEMORRHOID SURGERY    . TUBAL LIGATION     Social History   Occupational History  . Not on file.   Social History Main Topics  . Smoking status: Never Smoker  . Smokeless tobacco: Never Used  . Alcohol use No  . Drug use: No  . Sexual activity: Not on  file

## 2016-02-12 NOTE — Procedures (Signed)
Lumbosacral Transforaminal Epidural Steroid Injection - Infraneural Approach with Fluoroscopic Guidance  Patient: Brittney Gates      Date of Birth: Jul 25, 1924 MRN: [id number] PCP: [name], MD      Visit Date: 02/12/2016   Universal Protocol:    Date/Time: 11/14/172:46 PM  Consent Given By: the patient  Position: PRONE   Additional Comments: Vital signs were monitored before and after the procedure. Patient was prepped and draped in the usual sterile fashion. The correct patient, procedure, and site was verified.   Injection Procedure Details:  Procedure Site One Meds Administered:  Meds ordered this encounter  Medications  . lidocaine (PF) (XYLOCAINE) 1 % injection 0.3 mL  . dexamethasone (DECADRON) injection 15 mg      Laterality: Bilateral  Location/Site:  L3-L4  Needle size: 22 G  Needle type: Spinal  Needle Placement: Transforaminal  Findings:  -Contrast Used: 1 mL iohexol 180 mg iodine/mL   -Comments: Excellent flow of contrast along the nerve and into the epidural space.   Every spine from anesthetizing the skin to knee length of the muscle to getting close of the foramen was just a anxiety ridden injection for her. She didn't let us finished. And then interestingly after was all over she seems to be just happy that it was over and was no worse for wear at that point. In the future we may provide some mild sedation for the injection if we have to do it again.  Procedure Details: After squaring off the end-plates of the desired vertebral level to get a true AP view, the C-arm was obliqued to the painful side so that the superior articulating process is positioned about 1/3 the length of the inferior endplate.  The needle was aimed toward the junction of the superior articular process and the transverse process of the inferior vertebrae. The needle's initial entry is in the lower third of the foramen through Kambin's triangle. The soft tissues overlying  this target were infiltrated with 2-3 ml. of 1% Lidocaine without Epinephrine.  The spinal needle was then inserted and advanced toward the target using a "trajectory" view along the fluoroscope beam.  Under AP and lateral visualization, the needle was advanced so it did not puncture dura and did not traverse medially beyond the 6 o'clock position of the pedicle. Bi-planar projections were used to confirm position. Aspiration was confirmed to be negative for CSF and/or blood. A 1-2 ml. volume of Isovue-250 was injected and flow of contrast was noted at each level. Radiographs were obtained for documentation purposes.   After attaining the desired flow of contrast documented above, a 0.5 to 1.0 ml test dose of 0.25% Marcaine was injected into each respective transforaminal space.  The patient was observed for 90 seconds post injection.  After no sensory deficits were reported, and normal lower extremity motor function was noted,   the above injectate was administered so that equal amounts of the injectate were placed at each foramen (level) into the transforaminal epidural space.   Additional Comments:  The patient tolerated the procedure well Dressing: Band-Aid    Post-procedure details: Patient was observed during the procedure. Post-procedure instructions were reviewed.  Patient left the clinic in stable condition.

## 2016-02-12 NOTE — Patient Instructions (Signed)

## 2016-02-29 ENCOUNTER — Other Ambulatory Visit: Payer: Self-pay | Admitting: Internal Medicine

## 2016-03-07 ENCOUNTER — Other Ambulatory Visit: Payer: Self-pay

## 2016-03-12 ENCOUNTER — Other Ambulatory Visit: Payer: Self-pay | Admitting: Internal Medicine

## 2016-03-12 ENCOUNTER — Ambulatory Visit (INDEPENDENT_AMBULATORY_CARE_PROVIDER_SITE_OTHER): Payer: BLUE CROSS/BLUE SHIELD | Admitting: Orthopedic Surgery

## 2016-03-12 ENCOUNTER — Ambulatory Visit (INDEPENDENT_AMBULATORY_CARE_PROVIDER_SITE_OTHER): Payer: Medicare Other | Admitting: Orthopedic Surgery

## 2016-03-27 ENCOUNTER — Ambulatory Visit: Payer: Medicare Other | Admitting: Internal Medicine

## 2016-03-28 ENCOUNTER — Ambulatory Visit: Payer: Medicare Other | Admitting: Internal Medicine

## 2016-04-23 DIAGNOSIS — H53122 Transient visual loss, left eye: Secondary | ICD-10-CM | POA: Diagnosis not present

## 2016-04-23 DIAGNOSIS — H04203 Unspecified epiphora, bilateral lacrimal glands: Secondary | ICD-10-CM | POA: Diagnosis not present

## 2016-04-23 DIAGNOSIS — H353112 Nonexudative age-related macular degeneration, right eye, intermediate dry stage: Secondary | ICD-10-CM | POA: Diagnosis not present

## 2016-04-23 DIAGNOSIS — H353121 Nonexudative age-related macular degeneration, left eye, early dry stage: Secondary | ICD-10-CM | POA: Diagnosis not present

## 2016-04-24 ENCOUNTER — Telehealth: Payer: Self-pay | Admitting: Internal Medicine

## 2016-04-24 NOTE — Telephone Encounter (Signed)
Pt called requesting medication that will help with her back pain until she see Dr. Phe[facility] center. Advise pt that she need an appt to discuss this but pt stating she is in too much pain to move and hoping to get some help with this. Please call pt back

## 2016-04-24 NOTE — Telephone Encounter (Signed)
Patient contacted and stated awareness 

## 2016-04-24 NOTE — Telephone Encounter (Signed)
Since we have not seen her for this we would need to see and evaluate. She could try calling her orthopedic specialist to see if they can do something sooner than tomorrow.

## 2016-04-24 NOTE — Telephone Encounter (Signed)
Contacted patient and Brittney Gates stated that Brittney Gates is going to see Dr. Harl Bowie tomorrow, states Brittney Gates is in a lot of pain and I wanting something that will help.  I informed patient that Brittney Gates would more than likely need to be seen but Brittney Gates insisted that I still ask, saying that you have seen her and know her and that shes the one in pain and needs something.

## 2016-04-25 DIAGNOSIS — M47817 Spondylosis without myelopathy or radiculopathy, lumbosacral region: Secondary | ICD-10-CM | POA: Diagnosis not present

## 2016-04-25 DIAGNOSIS — M5187 Other intervertebral disc disorders, lumbosacral region: Secondary | ICD-10-CM | POA: Diagnosis not present

## 2016-04-25 DIAGNOSIS — M2578 Osteophyte, vertebrae: Secondary | ICD-10-CM | POA: Diagnosis not present

## 2016-04-25 DIAGNOSIS — Z9889 Other specified postprocedural states: Secondary | ICD-10-CM | POA: Diagnosis not present

## 2016-04-25 DIAGNOSIS — M5416 Radiculopathy, lumbar region: Secondary | ICD-10-CM | POA: Diagnosis not present

## 2016-04-25 DIAGNOSIS — Z981 Arthrodesis status: Secondary | ICD-10-CM | POA: Diagnosis not present

## 2016-04-25 DIAGNOSIS — S32010D Wedge compression fracture of first lumbar vertebra, subsequent encounter for fracture with routine healing: Secondary | ICD-10-CM | POA: Diagnosis not present

## 2016-04-25 DIAGNOSIS — M438X6 Other specified deforming dorsopathies, lumbar region: Secondary | ICD-10-CM | POA: Diagnosis not present

## 2016-04-25 DIAGNOSIS — Z8781 Personal history of (healed) traumatic fracture: Secondary | ICD-10-CM | POA: Diagnosis not present

## 2016-04-25 DIAGNOSIS — M533 Sacrococcygeal disorders, not elsewhere classified: Secondary | ICD-10-CM | POA: Diagnosis not present

## 2016-04-25 DIAGNOSIS — M5136 Other intervertebral disc degeneration, lumbar region: Secondary | ICD-10-CM | POA: Diagnosis not present

## 2016-04-26 ENCOUNTER — Other Ambulatory Visit (INDEPENDENT_AMBULATORY_CARE_PROVIDER_SITE_OTHER): Payer: Self-pay | Admitting: Orthopaedic Surgery

## 2016-04-28 ENCOUNTER — Emergency Department (HOSPITAL_COMMUNITY): Payer: Medicare Other

## 2016-04-28 ENCOUNTER — Inpatient Hospital Stay (HOSPITAL_COMMUNITY)
Admission: EM | Admit: 2016-04-28 | Discharge: 2016-05-01 | DRG: 552 | Disposition: A | Discharge status: Home Health | Payer: Medicare Other | Attending: Internal Medicine | Admitting: Internal Medicine

## 2016-04-28 ENCOUNTER — Encounter (HOSPITAL_COMMUNITY): Payer: Self-pay | Admitting: Emergency Medicine

## 2016-04-28 DIAGNOSIS — M25552 Pain in left hip: Secondary | ICD-10-CM | POA: Diagnosis not present

## 2016-04-28 DIAGNOSIS — K5900 Constipation, unspecified: Secondary | ICD-10-CM | POA: Diagnosis present

## 2016-04-28 DIAGNOSIS — M5417 Radiculopathy, lumbosacral region: Secondary | ICD-10-CM

## 2016-04-28 DIAGNOSIS — E039 Hypothyroidism, unspecified: Secondary | ICD-10-CM

## 2016-04-28 DIAGNOSIS — Z9861 Coronary angioplasty status: Secondary | ICD-10-CM

## 2016-04-28 DIAGNOSIS — M8448XA Pathological fracture, other site, initial encounter for fracture: Secondary | ICD-10-CM | POA: Diagnosis not present

## 2016-04-28 DIAGNOSIS — M5416 Radiculopathy, lumbar region: Secondary | ICD-10-CM | POA: Diagnosis not present

## 2016-04-28 DIAGNOSIS — Z888 Allergy status to other drugs, medicaments and biological substances status: Secondary | ICD-10-CM

## 2016-04-28 DIAGNOSIS — Z91041 Radiographic dye allergy status: Secondary | ICD-10-CM

## 2016-04-28 DIAGNOSIS — Z7982 Long term (current) use of aspirin: Secondary | ICD-10-CM

## 2016-04-28 DIAGNOSIS — M8448XG Pathological fracture, other site, subsequent encounter for fracture with delayed healing: Secondary | ICD-10-CM

## 2016-04-28 DIAGNOSIS — G8929 Other chronic pain: Secondary | ICD-10-CM | POA: Diagnosis present

## 2016-04-28 DIAGNOSIS — Z79899 Other long term (current) drug therapy: Secondary | ICD-10-CM

## 2016-04-28 DIAGNOSIS — R52 Pain, unspecified: Secondary | ICD-10-CM | POA: Diagnosis not present

## 2016-04-28 DIAGNOSIS — Z515 Encounter for palliative care: Secondary | ICD-10-CM

## 2016-04-28 DIAGNOSIS — M48061 Spinal stenosis, lumbar region without neurogenic claudication: Secondary | ICD-10-CM | POA: Diagnosis not present

## 2016-04-28 DIAGNOSIS — I10 Essential (primary) hypertension: Secondary | ICD-10-CM | POA: Diagnosis not present

## 2016-04-28 DIAGNOSIS — M5410 Radiculopathy, site unspecified: Secondary | ICD-10-CM | POA: Diagnosis not present

## 2016-04-28 DIAGNOSIS — M545 Low back pain: Secondary | ICD-10-CM | POA: Diagnosis not present

## 2016-04-28 DIAGNOSIS — M5117 Intervertebral disc disorders with radiculopathy, lumbosacral region: Principal | ICD-10-CM | POA: Diagnosis present

## 2016-04-28 DIAGNOSIS — E785 Hyperlipidemia, unspecified: Secondary | ICD-10-CM | POA: Diagnosis present

## 2016-04-28 DIAGNOSIS — Z981 Arthrodesis status: Secondary | ICD-10-CM

## 2016-04-28 DIAGNOSIS — M5489 Other dorsalgia: Secondary | ICD-10-CM | POA: Diagnosis not present

## 2016-04-28 DIAGNOSIS — M4848XA Fatigue fracture of vertebra, sacral and sacrococcygeal region, initial encounter for fracture: Secondary | ICD-10-CM | POA: Diagnosis present

## 2016-04-28 DIAGNOSIS — Z66 Do not resuscitate: Secondary | ICD-10-CM | POA: Diagnosis present

## 2016-04-28 DIAGNOSIS — M1990 Unspecified osteoarthritis, unspecified site: Secondary | ICD-10-CM | POA: Diagnosis present

## 2016-04-28 LAB — CBC
HCT: 31 % — ABNORMAL LOW (ref 36.0–46.0)
Hemoglobin: 10.1 g/dL — ABNORMAL LOW (ref 12.0–15.0)
MCH: 32.3 pg (ref 26.0–34.0)
MCHC: 32.6 g/dL (ref 30.0–36.0)
MCV: 99 fL (ref 78.0–100.0)
Platelets: 259 10*3/uL (ref 150–400)
RBC: 3.13 MIL/uL — ABNORMAL LOW (ref 3.87–5.11)
RDW: 19.5 % — ABNORMAL HIGH (ref 11.5–15.5)
WBC: 10.4 10*3/uL (ref 4.0–10.5)

## 2016-04-28 LAB — I-STAT CHEM 8, ED
BUN: 15 mg/dL (ref 6–20)
Calcium, Ion: 1.17 mmol/L (ref 1.15–1.40)
Chloride: 77 mmol/L — ABNORMAL LOW (ref 101–111)
Creatinine, Ser: 1 mg/dL (ref 0.44–1.00)
Glucose, Bld: 105 mg/dL — ABNORMAL HIGH (ref 65–99)
HCT: 33 % — ABNORMAL LOW (ref 36.0–46.0)
Hemoglobin: 11.2 g/dL — ABNORMAL LOW (ref 12.0–15.0)
Potassium: 4.7 mmol/L (ref 3.5–5.1)
Sodium: 139 mmol/L (ref 135–145)
TCO2: 30 mmol/L (ref 0–100)

## 2016-04-28 LAB — TSH: TSH: 3.983 u[IU]/mL (ref 0.350–4.500)

## 2016-04-28 MED ORDER — BENAZEPRIL HCL 20 MG PO TABS
20.0000 mg | ORAL_TABLET | Freq: Every day | ORAL | Status: DC
  Administered 2016-04-29 – 2016-05-01 (×3): 20 mg via ORAL
  Filled 2016-04-28 (×3): qty 1

## 2016-04-28 MED ORDER — GLYCERIN (LAXATIVE) 2.1 G RE SUPP
1.0000 | Freq: Once | RECTAL | Status: AC
  Administered 2016-04-28: 1 via RECTAL
  Filled 2016-04-28 (×2): qty 1

## 2016-04-28 MED ORDER — ZOLPIDEM TARTRATE 5 MG PO TABS
5.0000 mg | ORAL_TABLET | Freq: Every day | ORAL | Status: DC
  Administered 2016-04-28 – 2016-04-30 (×3): 5 mg via ORAL
  Filled 2016-04-28 (×4): qty 1

## 2016-04-28 MED ORDER — NEPAFENAC 0.3 % OP SUSP: 1.0000 [drp] | Freq: Two times a day (BID) | OPHTHALMIC | Status: DC

## 2016-04-28 MED ORDER — GADOBENATE DIMEGLUMINE 529 MG/ML IV SOLN
15.0000 mL | Freq: Once | INTRAVENOUS | Status: AC | PRN
  Administered 2016-04-28: 15 mL via INTRAVENOUS

## 2016-04-28 MED ORDER — HYDROMORPHONE HCL 1 MG/ML IJ SOLN
1.0000 mg | INTRAMUSCULAR | Status: DC | PRN
  Administered 2016-04-28 – 2016-04-30 (×7): 1 mg via INTRAVENOUS
  Filled 2016-04-28 (×7): qty 1

## 2016-04-28 MED ORDER — SENNOSIDES-DOCUSATE SODIUM 8.6-50 MG PO TABS
2.0000 | ORAL_TABLET | Freq: Two times a day (BID) | ORAL | Status: DC
  Administered 2016-04-28 – 2016-05-01 (×6): 2 via ORAL
  Filled 2016-04-28 (×6): qty 2

## 2016-04-28 MED ORDER — AMLODIPINE BESYLATE 5 MG PO TABS
5.0000 mg | ORAL_TABLET | Freq: Every day | ORAL | Status: DC
  Administered 2016-04-28 – 2016-05-01 (×4): 5 mg via ORAL
  Filled 2016-04-28 (×4): qty 1

## 2016-04-28 MED ORDER — HYDROMORPHONE HCL 1 MG/ML IJ SOLN
1.0000 mg | Freq: Once | INTRAMUSCULAR | Status: AC
  Administered 2016-04-28: 1 mg via INTRAVENOUS
  Filled 2016-04-28: qty 1

## 2016-04-28 MED ORDER — TRAMADOL HCL 50 MG PO TABS: 50.0000 mg | ORAL_TABLET | Freq: Four times a day (QID) | ORAL | Status: DC | PRN

## 2016-04-28 MED ORDER — SENNOSIDES-DOCUSATE SODIUM 8.6-50 MG PO TABS
2.0000 | ORAL_TABLET | Freq: Every evening | ORAL | Status: DC | PRN
  Administered 2016-04-30: 2 via ORAL

## 2016-04-28 MED ORDER — ACETAMINOPHEN 325 MG PO TABS
650.0000 mg | ORAL_TABLET | Freq: Four times a day (QID) | ORAL | Status: DC | PRN
  Filled 2016-04-28: qty 2

## 2016-04-28 NOTE — H&P (Signed)
History and Physical    ZENA WEERTS [id number] DOB: 1924/06/03 DOA: 04/28/2016  PCP: [name], MD  Patient coming from: Home  Chief Complaint: Back Pain  HPI: SOUTHERN FLANDERS is a 83 y.o. female with medical history significant of HTN, chronic back pain, HLD, anxiety and arthritis that presents for severe lower back pain and loss of continence. Patient states that she has struggled with back pain since breaking her hip and rehabing through that (October 2013).  She says the pain is burning and radiates down her legs.  Occasionally has required an epidural but says the past month and a half or so she has had pain she can pinpoint.  Pain had gotten so bad she had to get an MRI.  She says she needed an epidural injection on Halloween on 2017.  She broke out in a rash after getting that injection.  After that epidural injection she says her pain improved but she did not return for another injection for fear of getting rash again.  Says the rash was diffuse and red but could not further describe it.  Pain increased without repeating the epidural injection.  Pain then started in her right groin. Pain then became pinpoint right where titanium pin was in on her right side.  Dr. Harl Bowie at [facility] told her in 1981 there was nothing else to be done unless she couldn't walk again.  She made a follow up appointment at [facility] and she received pain medication and order for repeat MRI. Pain has now moved to the left hip and moved to ankle and foot.  Pain is constant, 10/10 at worst, burning, radiating down her leg to the foot.  ED Course: Patient seen by EDP.  Patient's orthopedist was called but he did not have plans to see patient unless specific orthopedic problem arose.  MRI performed showing New stress fractures of the sacrum. New left foraminal and extraforaminal disc protrusion at L5-S1 with a mass effect upon the left L5 nerve. Moderately severe spinal stenosis at L1-2, unchanged.  Compression of the thecal sac at L3-4 by fat at the site of the laminotomy defect.  Neurosurgery consulted- Dr. Trenton Gammon to see patient.  She was given diluadid for her pain.   Review of Systems: As per HPI otherwise 10 point review of systems negative.    Past Medical History:  Diagnosis Date  . Acute pancreatitis   . ANEMIA   . ANXIETY   . Arthritis   . Chronic anemia   . Diverticulosis   . Gastritis   . Hemorrhoids   . Hiatal hernia   . HYPERLIPIDEMIA   . HYPERTENSION   . HYPOTHYROIDISM   . LOW BACK PAIN, CHRONIC   . PAD (peripheral artery disease) ([place])     Past Surgical History:  Procedure Laterality Date  . ANGIOPLASTY  1995  . APPENDECTOMY    . BACK SURGERY     x's 2  . BREAST SURGERY     Benign  . FEMUR IM NAIL  01/21/2012   Procedure: INTRAMEDULLARY (IM) NAIL FEMORAL;  Surgeon: Newt Minion, MD;  Location: [facility];  Service: Orthopedics;  Laterality: Left;  Intertrochanteric Nail Left Hip  . HEMORRHOID SURGERY    . TUBAL LIGATION       reports that she has never smoked. She has never used smokeless tobacco. She reports that she does not drink alcohol or use drugs.  Allergies  Allergen Reactions  . Statins Other (See Comments)  Leg weakness  . Dexamethasone Itching and Rash    Family History  Problem Relation Age of Onset  . Lung cancer Father   . Gallbladder disease Son   . Gallbladder disease Daughter   . Colon cancer Neg Hx      Prior to Admission medications   Medication Sig Start Date End Date Taking? Authorizing Provider  amLODipine-benazepril (LOTREL) 5-20 MG capsule TAKE 1 CAPSULE BY MOUTH DAILY. 03/03/16  Yes [name], MD  aspirin EC 81 MG tablet Take 81 mg by mouth daily.   Yes Historical Provider, MD  calcium carbonate (OS-CAL) 600 MG TABS Take 600 mg by mouth 2 (two) times daily with a meal. Reported on 10/05/2015   Yes Historical Provider, MD  Cholecalciferol 2000 UNITS TABS Take 2,000 Units by mouth daily.   Yes Historical  Provider, MD  docusate sodium (COLACE) 100 MG capsule Take 100 mg by mouth at bedtime as needed for mild constipation.   Yes Historical Provider, MD  levothyroxine (SYNTHROID, LEVOTHROID) 100 MCG tablet TAKE ONE TABLET BY MOUTH DAILY BEFORE BREAKFAST Patient taking differently: TAKE 100mg  TABLET BY MOUTH DAILY BEFORE BREAKFAST 03/12/16  Yes [name], MD  Multiple Vitamin (MULTIVITAMIN WITH MINERALS) TABS tablet Take 1 tablet by mouth daily.   Yes Historical Provider, MD  naproxen (NAPROSYN) 500 MG tablet TAKE ONE TABLET BY MOUTH TWICE DAILY WITH FOOD AS NEEDED FOR PAIN ANDINFLAMMATION Patient taking differently: TAKE 500mg  TABLET BY MOUTH TWICE DAILY WITH FOOD AS NEEDED FOR PAIN ANDINFLAMMATION 04/28/16  Yes Mcarthur Rossetti, MD  nepafenac (ILEVRO) 0.3 % ophthalmic suspension Place 1 drop into both eyes 2 (two) times daily.   Yes Historical Provider, MD  oxyCODONE (OXY IR/ROXICODONE) 5 MG immediate release tablet Take 5 mg by mouth every 4 (four) hours as needed for pain. 04/25/16  Yes Historical Provider, MD  sennosides-docusate sodium (SENOKOT-S) 8.6-50 MG tablet Take 2 tablets by mouth at bedtime as needed for constipation.   Yes Historical Provider, MD  zolpidem (AMBIEN) 10 MG tablet Take 1 tablet (10 mg total) by mouth at bedtime as needed for sleep. Patient taking differently: Take 5-10 mg by mouth at bedtime. Pt takes 5mg  right at bedtime, and then another 5mg  after she wakes up to use the restroom in the middle of the night 12/17/15  Yes [name], MD    Physical Exam: Vitals:   04/28/16 0850 04/28/16 1103 04/28/16 1259 04/28/16 1517  BP: 156/71 149/75 148/60 132/81  Pulse: 70 62 64 78  Resp: 18 18 16 18   Temp: 98 F (36.7 C)     TempSrc: Oral     SpO2: 98% 99% 98% 97%      Constitutional: moderate distress, moving around stretcher attempting to get comfortable Vitals:   04/28/16 0850 04/28/16 1103 04/28/16 1259 04/28/16 1517  BP: 156/71 149/75 148/60  132/81  Pulse: 70 62 64 78  Resp: 18 18 16 18   Temp: 98 F (36.7 C)     TempSrc: Oral     SpO2: 98% 99% 98% 97%   Eyes: PERRL, lids and conjunctivae normal, eyes watery ENMT: Mucous membranes are moist. Posterior pharynx clear of any exudate or lesions.Normal dentition.  Neck: normal, supple, no masses, no thyromegaly Respiratory: clear to auscultation bilaterally, no wheezing, no crackles. Normal respiratory effort. No accessory muscle use.  Cardiovascular: Regular rate and rhythm, no rubs / gallops; I/VI systolic murmur heard best at mitral area. No extremity edema. 2+ pedal pulses. No carotid bruits.  Abdomen: no tenderness, no masses palpated. No hepatosplenomegaly. Bowel sounds positive.  Musculoskeletal: no clubbing / cyanosis. No joint deformity upper and lower extremities. Good ROM, no contractures. Pain with attempts to stand Skin: no rashes, lesions, ulcers. No induration, scar on midline lower back Neurologic: CN 2-12 grossly intact. Sensation intact, DTR normal. Able to move upper and lower extremities bilaterally and independently Psychiatric: Normal judgment and insight. Alert and oriented x 3. Normal mood.     Labs on Admission: I have personally reviewed following labs and imaging studies  CBC:  Recent Labs Lab 04/28/16 0918  HGB 11.2*  HCT 123456*   Basic Metabolic Panel:  Recent Labs Lab 04/28/16 0918  NA 139  K 4.7  CL 77*  GLUCOSE 105*  BUN 15  CREATININE 1.00   GFR: CrCl cannot be calculated (Unknown ideal weight.). Liver Function Tests: No results for input(s): AST, ALT, ALKPHOS, BILITOT, PROT, ALBUMIN in the last 168 hours. No results for input(s): LIPASE, AMYLASE in the last 168 hours. No results for input(s): AMMONIA in the last 168 hours. Coagulation Profile: No results for input(s): INR, PROTIME in the last 168 hours. Cardiac Enzymes: No results for input(s): CKTOTAL, CKMB, CKMBINDEX, TROPONINI in the last 168 hours. BNP (last 3  results) No results for input(s): PROBNP in the last 8760 hours. HbA1C: No results for input(s): HGBA1C in the last 72 hours. CBG: No results for input(s): GLUCAP in the last 168 hours. Lipid Profile: No results for input(s): CHOL, HDL, LDLCALC, TRIG, CHOLHDL, LDLDIRECT in the last 72 hours. Thyroid Function Tests: No results for input(s): TSH, T4TOTAL, FREET4, T3FREE, THYROIDAB in the last 72 hours. Anemia Panel: No results for input(s): VITAMINB12, FOLATE, FERRITIN, TIBC, IRON, RETICCTPCT in the last 72 hours. Urine analysis:    Component Value Date/Time   COLORURINE YELLOW 10/30/2014 [place] 10/30/2014 1208   LABSPEC <=1.005 (A) 10/30/2014 1208   PHURINE 6.0 10/30/2014 1208   GLUCOSEU NEGATIVE 10/30/2014 1208   HGBUR TRACE-LYSED (A) 10/30/2014 1208   BILIRUBINUR NEGATIVE 10/30/2014 1208   KETONESUR NEGATIVE 10/30/2014 1208   PROTEINUR NEGATIVE 05/16/2013 1124   UROBILINOGEN 0.2 10/30/2014 1208   NITRITE NEGATIVE 10/30/2014 1208   LEUKOCYTESUR NEGATIVE 10/30/2014 1208   Sepsis Labs: !!!!!!!!!!!!!!!!!!!!!!!!!!!!!!!!!!!!!!!!!!!! @LABRCNTIP (procalcitonin:4,lacticidven:4) )No results found for this or any previous visit (from the past 240 hour(s)).   Radiological Exams on Admission: Mr Lumbar Spine W Wo Contrast  Result Date: 04/28/2016 CLINICAL DATA:  Recurrent severe low back pain. New onset of urinary incontinence. EXAM: MRI LUMBAR SPINE WITHOUT AND WITH CONTRAST TECHNIQUE: Multiplanar and multiecho pulse sequences of the lumbar spine were obtained without and with intravenous contrast. CONTRAST:  84mL MULTIHANCE GADOBENATE DIMEGLUMINE 529 MG/ML IV SOLN COMPARISON:  MRI dated 01/29/2016 and CT scan dated 10/15/2015 FINDINGS: Segmentation:  Standard. Alignment:  Physiologic. Vertebrae: Old compression fracture of L1 treated with vertebroplasty. Vertical stress fractures of both sacral ala as well as a transverse stress fracture through inferior aspect of the S1  segment of the sacrum. Conus medullaris: Extends to the  T12-L1 level and appears normal. Paraspinal and other soft tissues: Diverticulosis of the distal colon. Disc levels: T12-L1: No protrusion or bulging of the disc. Slight chronic protrusion of the posterosuperior aspect of the L1 vertebral body into the spinal canal without neural impingement. Old healed L1 fracture. L1-2: Schmorl's nodes in the endplates at [id number]. No disc bulging or protrusion. Hypertrophy of the ligamentum flavum and facet joints combine to create moderately severe spinal stenosis without  focal neural impingement. L2-3: Small broad-based disc bulge most prominent into the left neural foramen. Short pedicles and slight hypertrophy of the ligamentum flavum and facet joints combine to create slight spinal stenosis without focal neural impingement L3-4: Marked disc space narrowing with broad-based endplate osteophytes and disc bulging. The thecal sac is compressed by fat and laminectomy defect. Right extraforaminal disc protrusion appears to have a slight mass effect upon the exiting right L3 nerve. Left foraminal protrusion also of appears to have a slight mass effect upon the left L3 nerve. L4-5: Posterior fusion with excellent posterior decompression. No pathologic enhancement after contrast administration. L5-S1: Progressive severe degenerative disc disease with new fluid in the disc space with a chronic broad-based soft disc protrusion with new protrusion into the left neural foramen which appears to have a mass effect upon the left L5 nerve. IMPRESSION: 1. New stress fractures of the sacrum. 2. New left foraminal and extraforaminal disc protrusion at L5-S1 with a mass effect upon the left L5 nerve. 3. Moderately severe spinal stenosis at L1-2, unchanged. 4. Compression of the thecal sac at L3-4 by fat at the site of the laminotomy defect. Electronically Signed   By: Lorriane Shire M.D.   On: 04/28/2016 12:37   Dg Hip Unilat W Or Wo Pelvis  2-3 Views Left  Result Date: 04/28/2016 CLINICAL DATA:  Worsening low back and left hip pain over the past 2 weeks with no new trauma. Patient has undergone recent back surgery and is undergone previous ORIF for an intertrochanteric fracture of the left hip. EXAM: DG HIP (WITH OR WITHOUT PELVIS) 2-3V LEFT COMPARISON:  AP view of the lumbar spine of January 10, 2016 which included portion of the left hip. FINDINGS: The bones are subjectively osteopenic. The patient has undergone previous posterior fusion at L4-5. The bony pelvis exhibits no lytic or blastic lesion. The patient has undergone ORIF of an intertrochanteric fracture of the left hip the metallic rods and telescoping nail reveal no evidence of loosening. The left hip joint space is reasonably well-maintained. There is no acute fracture. IMPRESSION: There is no acute or significant chronic bony abnormality of the left hip joint. There are chronic changes from previous ORIF for an intertrochanteric fracture. No acute pelvic abnormality is observed. Electronically Signed   By: David  Martinique M.D.   On: 04/28/2016 09:40    EKG: not performed  Assessment/Plan Principal Problem:   Lumbar radicular pain Active Problems:   Hypothyroidism   Hyperlipidemia   Essential hypertension   LOW BACK PAIN, CHRONIC     Lumbar radicular pain - MRI showing New stress fractures of the sacrum. New left foraminal and extraforaminal disc protrusion at L5-S1 with a mass effect upon the left L5 nerve. Moderately severe spinal stenosis at L1-2, unchanged. Compression of the thecal sac at L3-4 by fat at the site of the laminotomy defect - neurosurgery consulted by EDP (Dr. Trenton Gammon to see the patient) - diluadid IV 1mg  q4h PRN pain - tramadol for moderate pain - PT/OT eval - senna and docusate - tylenol and ibuprofen for mild pain and fever - restart DVT prophylaxis pending plan from NSG  HTN - restart amlodipine and benazapril - monitor - patient has not take  medication for the past two days  Hypothyroidism - draw TSH - restart levothyroxine at appropriate dosage  Constipation - glycerin suppository ordered - monitor for BM     DVT prophylaxis: SCDs  Code Status: Full Family Communication: no family bedside  Disposition Plan:  will likely discharge home to previous home environment  Consults called: Neurosurgery called by EDP  Admission status:  Observation to med- surg    Loretha Stapler MD [facility] Pager [phone]  If 7PM-7AM, please contact night-coverage [email or web address] Password [facility]  04/28/2016, 3:54 PM

## 2016-04-28 NOTE — ED Notes (Signed)
Bed: WA02 Expected date:  Expected time:  Means of arrival:  Comments: EMS 

## 2016-04-28 NOTE — ED Notes (Addendum)
Attempted x 2 to call report to [place] , Therapist, sports unavailable

## 2016-04-28 NOTE — ED Provider Notes (Signed)
[facility] DEPT Provider Note   CSN: [id number] Arrival date & time: 04/28/16  0847     History   Chief Complaint Chief Complaint  Patient presents with  . Back Pain    lower    HPI Brittney Gates is a 83 y.o. female.  HPI  Pt has had some chronic problems with her lower back and hips  for a while.  She has had prior hip surgeries.  Previously it was the right side that was bothering her more but now it is primarily the left. Over the last two weeks it has gotten worse where she has trouble walking.  She has seen  her back doctors at [facility] as well as a doctor in Dr. Jess Barters office.  She was seen on Jan 26th at [facility] and was diagnosed with spinal stenosis of the lumbar region with neurogenic claudication. She has had an MRI as well as an epidural.  She was given an Oxycodone rx.  Despite her treatments, the pain continues to progress in severity. The patient states she is unable to tolerate it at home and she had to call EMS this morning Pain now is in the  lower back that radiates down left leg to her heel.  Pain is severe towards her left hip and in the back of her left knee. Increases with walking and standing.  Even trying to sit increases the pain. Over the last week, she can barely walk.   This morning was even worse.  Patient states she called Dr. Jess Barters office and Dr. Durward Fortes is supposed to see her in the emergency room. She feels that there is something wrong with her left hip and she wants to see Dr. Durward Fortes.  Past Medical History:  Diagnosis Date  . Acute pancreatitis   . ANEMIA   . ANXIETY   . Arthritis   . Chronic anemia   . Diverticulosis   . Gastritis   . Hemorrhoids   . Hiatal hernia   . HYPERLIPIDEMIA   . HYPERTENSION   . HYPOTHYROIDISM   . LOW BACK PAIN, CHRONIC   . PAD (peripheral artery disease) [facility])     Patient Active Problem List   Diagnosis Date Noted  . Routine general medical examination at a health care facility 11/01/2015  . RUQ pain  10/05/2015  . Abnormal CT scan, chest 05/06/2012  . Osteoporosis 04/30/2012  . Hip fracture left 01/20/2012  . PVD (peripheral vascular disease) ([place])   . ANEMIA 04/24/2010  . Hypothyroidism 08/14/2007  . Hyperlipidemia 08/14/2007  . ANXIETY 08/14/2007  . Essential hypertension 08/14/2007  . ARTHRITIS 08/14/2007  . LOW BACK PAIN, CHRONIC 08/14/2007    Past Surgical History:  Procedure Laterality Date  . ANGIOPLASTY  1995  . APPENDECTOMY    . BACK SURGERY     x's 2  . BREAST SURGERY     Benign  . FEMUR IM NAIL  01/21/2012   Procedure: INTRAMEDULLARY (IM) NAIL FEMORAL;  Surgeon: Newt Minion, MD;  Location: [place];  Service: Orthopedics;  Laterality: Left;  Intertrochanteric Nail Left Hip  . HEMORRHOID SURGERY    . TUBAL LIGATION      OB History    No data available       Home Medications    Prior to Admission medications   Medication Sig Start Date End Date Taking? Authorizing Provider  amLODipine-benazepril (LOTREL) 5-20 MG capsule TAKE 1 CAPSULE BY MOUTH DAILY. 03/03/16  Yes [name], MD  aspirin EC  81 MG tablet Take 81 mg by mouth daily.   Yes Historical Provider, MD  calcium carbonate (OS-CAL) 600 MG TABS Take 600 mg by mouth 2 (two) times daily with a meal. Reported on 10/05/2015   Yes Historical Provider, MD  Cholecalciferol 2000 UNITS TABS Take 2,000 Units by mouth daily.   Yes Historical Provider, MD  docusate sodium (COLACE) 100 MG capsule Take 100 mg by mouth at bedtime as needed for mild constipation.   Yes Historical Provider, MD  levothyroxine (SYNTHROID, LEVOTHROID) 100 MCG tablet TAKE ONE TABLET BY MOUTH DAILY BEFORE BREAKFAST Patient taking differently: TAKE 100mg  TABLET BY MOUTH DAILY BEFORE BREAKFAST 03/12/16  Yes [name], MD  Multiple Vitamin (MULTIVITAMIN WITH MINERALS) TABS tablet Take 1 tablet by mouth daily.   Yes Historical Provider, MD  naproxen (NAPROSYN) 500 MG tablet TAKE ONE TABLET BY MOUTH TWICE DAILY WITH FOOD AS NEEDED  FOR PAIN ANDINFLAMMATION Patient taking differently: TAKE 500mg  TABLET BY MOUTH TWICE DAILY WITH FOOD AS NEEDED FOR PAIN ANDINFLAMMATION 04/28/16  Yes Mcarthur Rossetti, MD  nepafenac (ILEVRO) 0.3 % ophthalmic suspension Place 1 drop into both eyes 2 (two) times daily.   Yes Historical Provider, MD  oxyCODONE (OXY IR/ROXICODONE) 5 MG immediate release tablet Take 5 mg by mouth every 4 (four) hours as needed for pain. 04/25/16  Yes Historical Provider, MD  sennosides-docusate sodium (SENOKOT-S) 8.6-50 MG tablet Take 2 tablets by mouth at bedtime as needed for constipation.   Yes Historical Provider, MD  zolpidem (AMBIEN) 10 MG tablet Take 1 tablet (10 mg total) by mouth at bedtime as needed for sleep. Patient taking differently: Take 5-10 mg by mouth at bedtime. Pt takes 5mg  right at bedtime, and then another 5mg  after she wakes up to use the restroom in the middle of the night 12/17/15  Yes [name], MD    Family History Family History  Problem Relation Age of Onset  . Lung cancer Father   . Gallbladder disease Son   . Gallbladder disease Daughter   . Colon cancer Neg Hx     Social History Social History  Substance Use Topics  . Smoking status: Never Smoker  . Smokeless tobacco: Never Used  . Alcohol use No     Allergies   Statins and Dexamethasone   Review of Systems Review of Systems  Constitutional: Negative for fever.  Gastrointestinal: Positive for constipation. Negative for vomiting.  Endocrine: Negative for polyuria.  Genitourinary: Negative for dysuria.  Musculoskeletal: Positive for back pain and gait problem. Negative for joint swelling, neck pain and neck stiffness.  Neurological: Negative for numbness.  All other systems reviewed and are negative.    Physical Exam Updated Vital Signs BP 148/60   Pulse 64   Temp 98 F (36.7 C) (Oral)   Resp 16   SpO2 98%   Physical Exam  Constitutional: She appears distressed.  HENT:  Head: Normocephalic  and atraumatic.  Right Ear: External ear normal.  Left Ear: External ear normal.  Eyes: Conjunctivae are normal. Right eye exhibits no discharge. Left eye exhibits no discharge. No scleral icterus.  Neck: Neck supple. No tracheal deviation present.  Cardiovascular: Normal rate, regular rhythm and intact distal pulses.   Pulmonary/Chest: Effort normal and breath sounds normal. No stridor. No respiratory distress. She has no wheezes. She has no rales.  Abdominal: Soft. Bowel sounds are normal. She exhibits no distension. There is no tenderness. There is no rebound and no guarding.  Musculoskeletal: She exhibits  no edema or tenderness.  Tenderness to palpation lumbar spine and left hip region, no edema of the lower extremities, normal pulses  Neurological: She is alert. She has normal strength. No cranial nerve deficit (no facial droop, extraocular movements intact, no slurred speech) or sensory deficit. She exhibits normal muscle tone. She displays no seizure activity. Coordination normal.  Sensations intact, patient's unable to sit up or stand because of severe pain  Skin: Skin is warm and dry. No rash noted.  Psychiatric: She has a normal mood and affect.  Nursing note and vitals reviewed.    ED Treatments / Results  Labs (all labs ordered are listed, but only abnormal results are displayed) Labs Reviewed  I-STAT CHEM 8, ED - Abnormal; Notable for the following:       Result Value   Chloride 77 (*)    Glucose, Bld 105 (*)    Hemoglobin 11.2 (*)    HCT 33.0 (*)    All other components within normal limits    EKG  EKG Interpretation None       Radiology Mr Lumbar Spine W Wo Contrast  Result Date: 04/28/2016 CLINICAL DATA:  Recurrent severe low back pain. New onset of urinary incontinence. EXAM: MRI LUMBAR SPINE WITHOUT AND WITH CONTRAST TECHNIQUE: Multiplanar and multiecho pulse sequences of the lumbar spine were obtained without and with intravenous contrast. CONTRAST:  11mL  MULTIHANCE GADOBENATE DIMEGLUMINE 529 MG/ML IV SOLN COMPARISON:  MRI dated 01/29/2016 and CT scan dated 10/15/2015 FINDINGS: Segmentation:  Standard. Alignment:  Physiologic. Vertebrae: Old compression fracture of L1 treated with vertebroplasty. Vertical stress fractures of both sacral ala as well as a transverse stress fracture through inferior aspect of the S1 segment of the sacrum. Conus medullaris: Extends to the  T12-L1 level and appears normal. Paraspinal and other soft tissues: Diverticulosis of the distal colon. Disc levels: T12-L1: No protrusion or bulging of the disc. Slight chronic protrusion of the posterosuperior aspect of the L1 vertebral body into the spinal canal without neural impingement. Old healed L1 fracture. L1-2: Schmorl's nodes in the endplates at [id number]. No disc bulging or protrusion. Hypertrophy of the ligamentum flavum and facet joints combine to create moderately severe spinal stenosis without focal neural impingement. L2-3: Small broad-based disc bulge most prominent into the left neural foramen. Short pedicles and slight hypertrophy of the ligamentum flavum and facet joints combine to create slight spinal stenosis without focal neural impingement L3-4: Marked disc space narrowing with broad-based endplate osteophytes and disc bulging. The thecal sac is compressed by fat and laminectomy defect. Right extraforaminal disc protrusion appears to have a slight mass effect upon the exiting right L3 nerve. Left foraminal protrusion also of appears to have a slight mass effect upon the left L3 nerve. L4-5: Posterior fusion with excellent posterior decompression. No pathologic enhancement after contrast administration. L5-S1: Progressive severe degenerative disc disease with new fluid in the disc space with a chronic broad-based soft disc protrusion with new protrusion into the left neural foramen which appears to have a mass effect upon the left L5 nerve. IMPRESSION: 1. New stress fractures of the  sacrum. 2. New left foraminal and extraforaminal disc protrusion at L5-S1 with a mass effect upon the left L5 nerve. 3. Moderately severe spinal stenosis at L1-2, unchanged. 4. Compression of the thecal sac at L3-4 by fat at the site of the laminotomy defect. Electronically Signed   By: Lorriane Shire M.D.   On: 04/28/2016 12:37   Dg Hip Unilat W Or Wo  Pelvis 2-3 Views Left  Result Date: 04/28/2016 CLINICAL DATA:  Worsening low back and left hip pain over the past 2 weeks with no new trauma. Patient has undergone recent back surgery and is undergone previous ORIF for an intertrochanteric fracture of the left hip. EXAM: DG HIP (WITH OR WITHOUT PELVIS) 2-3V LEFT COMPARISON:  AP view of the lumbar spine of January 10, 2016 which included portion of the left hip. FINDINGS: The bones are subjectively osteopenic. The patient has undergone previous posterior fusion at L4-5. The bony pelvis exhibits no lytic or blastic lesion. The patient has undergone ORIF of an intertrochanteric fracture of the left hip the metallic rods and telescoping nail reveal no evidence of loosening. The left hip joint space is reasonably well-maintained. There is no acute fracture. IMPRESSION: There is no acute or significant chronic bony abnormality of the left hip joint. There are chronic changes from previous ORIF for an intertrochanteric fracture. No acute pelvic abnormality is observed. Electronically Signed   By: David  Martinique M.D.   On: 04/28/2016 09:40   Reviewed October MRI IMPRESSION: 1. Adjacent segment disease at L3-L4 with severe bilateral foraminal stenosis. 2. Multilevel mild-to-moderate foraminal narrowing, including at the postsurgical levels. 3. Moderate L1-L2 multifactorial spinal canal stenosis.  Interface, Rad Results In - 04/25/2016 7:05 PM EST  X-RAY LUMBAR SPINE (AP & LATERAL, SUPINE & UPRIGHT), 04/25/2016 1:10 PM  INDICATION: back pain \ \ M54.5 Spine pain, lumbar  COMPARISON: MR dated 10/20/2012 and  lumbar spine x-rays 12/27/2008  CONCLUSION:  Frontal and lateral views of the lumbar spine in the supine and erect position are submitted.  1. Redemonstrated postsurgical changes of an L4-5 posterior fusion with pedicle screw and plate internal fixation. No evidence of interval hardware complication. 2. Redemonstrated remote compression fracture of L1 which is status post vertebroplasty. No interval progression of height loss. No change in alignment of the lumbar spine. 3. No acute fractures.  4. Osteopenia. 5. Advanced adjacent level degenerative changes, including right eccentric disc height loss at L3-4, bulky endplate osteophyte formation, endplate sclerosis, and facet degeneration. Disc height loss is also progressed at L5-S1. There is moderate disc space height loss at L1-2 and L2-3. 6. Bridging anterior osteophytes throughout the lower thoracic spine, consistent with diffuse idiopathic skeletal hyperostosis (DISH). Partially imaged left femoral head screw. Moderate degenerative changes of the bilateral sacroiliac joints.    Electronically Signed   By: Ulyses Jarred M.D.   On: 01/29/2016 15:17 Procedures Procedures (including critical care time)  Medications Ordered in ED Medications  HYDROmorphone (DILAUDID) injection 1 mg (1 mg Intravenous Given 04/28/16 0936)  gadobenate dimeglumine (MULTIHANCE) injection 15 mL (15 mLs Intravenous Contrast Given 04/28/16 1208)     Initial Impression / Assessment and Plan / ED Course  I have reviewed the triage vital signs and the nursing notes.  Pertinent labs & imaging results that were available during my care of the patient were reviewed by me and considered in my medical decision making (see chart for details).  Clinical Course as of Apr 29 1403  Mon Apr 28, 2016  0957 I spoke with Dr Durward Fortes.  He had limited information about the patient, that she was having diffuse pain and was going to the ED.  He was not planning on seeing the  patient in the ED.  He will certainly come in if I identify any acute orthopedic issues.  [JK]  [id number] Hip xrays negative.  Doubt that her sx are related to her hip.  Will get an MRI to evaluate further  [JK]    Clinical Course User Index [JK] Dorie Rank, MD     MRI today demonstrates a herniated disc with neural impingement along the left L5 nerve root.  She also has sacral insufficiency fractures. I suspect the disc impingement is the primary was of her pain. Patient is unable to stand. I will consult with neurosurgery. She may require admission for pain management and further treatment.  Final Clinical Impressions(s) / ED Diagnoses   Final diagnoses:  Radicular low back pain  Lumbosacral radiculopathy at L5  Sacral insufficiency fracture, initial encounter    New Prescriptions New Prescriptions   No medications on file     Dorie Rank, MD 04/28/16 1405

## 2016-04-28 NOTE — Consult Note (Signed)
Reason for Consult: Back pain with radiculopathy Referring Physician: Emergency department  Brittney Gates is an 83 y.o. female.  HPI: 83 year old female status post previous lumbar decompression and fusion done by Dr. Harl Bowie and Rondall Allegra remotely. Patient with history of persistent lumbar pain slowly worsening over the past few years. The patient recently was seen by Dr. branch in follow-up a little over 1 week ago. At the time she was experiencing mostly right sided hip and groin pain with some associated lower back pain. Over the past week or symptoms of shifted and now she's having much more prominent left lower extremity pain. Symptoms are aggravated by weightbearing or attempting to walk. She has some radiating numbness. She has no weakness. She has no bowel or bladder dysfunction.  Past Medical History:  Diagnosis Date  . Acute pancreatitis   . ANEMIA   . ANXIETY   . Arthritis   . Chronic anemia   . Diverticulosis   . Gastritis   . Hemorrhoids   . Hiatal hernia   . HYPERLIPIDEMIA   . HYPERTENSION   . HYPOTHYROIDISM   . LOW BACK PAIN, CHRONIC   . PAD (peripheral artery disease) ([place])     Past Surgical History:  Procedure Laterality Date  . ANGIOPLASTY  1995  . APPENDECTOMY    . BACK SURGERY     x's 2  . BREAST SURGERY     Benign  . FEMUR IM NAIL  01/21/2012   Procedure: INTRAMEDULLARY (IM) NAIL FEMORAL;  Surgeon: Newt Minion, MD;  Location: [facility];  Service: Orthopedics;  Laterality: Left;  Intertrochanteric Nail Left Hip  . HEMORRHOID SURGERY    . TUBAL LIGATION      Family History  Problem Relation Age of Onset  . Lung cancer Father   . Gallbladder disease Son   . Gallbladder disease Daughter   . Colon cancer Neg Hx     Social History:  reports that she has never smoked. She has never used smokeless tobacco. She reports that she does not drink alcohol or use drugs.  Allergies:  Allergies  Allergen Reactions  . Statins Other (See Comments)    Leg  weakness  . Dexamethasone Itching and Rash    Medications: I have reviewed the patient's current medications.  Results for orders placed or performed during the hospital encounter of 04/28/16 (from the past 48 hour(s))  I-stat chem 8, ed     Status: Abnormal   Collection Time: 04/28/16  9:18 AM  Result Value Ref Range   Sodium 139 135 - 145 mmol/L   Potassium 4.7 3.5 - 5.1 mmol/L   Chloride 77 (L) 101 - 111 mmol/L   BUN 15 6 - 20 mg/dL   Creatinine, Ser 1.00 0.44 - 1.00 mg/dL   Glucose, Bld 105 (H) 65 - 99 mg/dL   Calcium, Ion 1.17 1.15 - 1.40 mmol/L   TCO2 30 0 - 100 mmol/L   Hemoglobin 11.2 (L) 12.0 - 15.0 g/dL   HCT 33.0 (L) 36.0 - 46.0 %   Comment NOTIFIED PHYSICIAN   CBC     Status: Abnormal   Collection Time: 04/28/16  5:36 PM  Result Value Ref Range   WBC 10.4 4.0 - 10.5 K/uL   RBC 3.13 (L) 3.87 - 5.11 MIL/uL   Hemoglobin 10.1 (L) 12.0 - 15.0 g/dL   HCT 31.0 (L) 36.0 - 46.0 %   MCV 99.0 78.0 - 100.0 fL   MCH 32.3 26.0 - 34.0 pg  MCHC 32.6 30.0 - 36.0 g/dL   RDW 19.5 (H) 11.5 - 15.5 %   Platelets 259 150 - 400 K/uL  TSH     Status: None   Collection Time: 04/28/16  5:38 PM  Result Value Ref Range   TSH 3.983 0.350 - 4.500 uIU/mL    Comment: Performed by a 3rd Generation assay with a functional sensitivity of <=0.01 uIU/mL.    Mr Lumbar Spine W Wo Contrast  Result Date: 04/28/2016 CLINICAL DATA:  Recurrent severe low back pain. New onset of urinary incontinence. EXAM: MRI LUMBAR SPINE WITHOUT AND WITH CONTRAST TECHNIQUE: Multiplanar and multiecho pulse sequences of the lumbar spine were obtained without and with intravenous contrast. CONTRAST:  44mL MULTIHANCE GADOBENATE DIMEGLUMINE 529 MG/ML IV SOLN COMPARISON:  MRI dated 01/29/2016 and CT scan dated 10/15/2015 FINDINGS: Segmentation:  Standard. Alignment:  Physiologic. Vertebrae: Old compression fracture of L1 treated with vertebroplasty. Vertical stress fractures of both sacral ala as well as a transverse stress  fracture through inferior aspect of the S1 segment of the sacrum. Conus medullaris: Extends to the  T12-L1 level and appears normal. Paraspinal and other soft tissues: Diverticulosis of the distal colon. Disc levels: T12-L1: No protrusion or bulging of the disc. Slight chronic protrusion of the posterosuperior aspect of the L1 vertebral body into the spinal canal without neural impingement. Old healed L1 fracture. L1-2: Schmorl's nodes in the endplates at [id number]. No disc bulging or protrusion. Hypertrophy of the ligamentum flavum and facet joints combine to create moderately severe spinal stenosis without focal neural impingement. L2-3: Small broad-based disc bulge most prominent into the left neural foramen. Short pedicles and slight hypertrophy of the ligamentum flavum and facet joints combine to create slight spinal stenosis without focal neural impingement L3-4: Marked disc space narrowing with broad-based endplate osteophytes and disc bulging. The thecal sac is compressed by fat and laminectomy defect. Right extraforaminal disc protrusion appears to have a slight mass effect upon the exiting right L3 nerve. Left foraminal protrusion also of appears to have a slight mass effect upon the left L3 nerve. L4-5: Posterior fusion with excellent posterior decompression. No pathologic enhancement after contrast administration. L5-S1: Progressive severe degenerative disc disease with new fluid in the disc space with a chronic broad-based soft disc protrusion with new protrusion into the left neural foramen which appears to have a mass effect upon the left L5 nerve. IMPRESSION: 1. New stress fractures of the sacrum. 2. New left foraminal and extraforaminal disc protrusion at L5-S1 with a mass effect upon the left L5 nerve. 3. Moderately severe spinal stenosis at L1-2, unchanged. 4. Compression of the thecal sac at L3-4 by fat at the site of the laminotomy defect. Electronically Signed   By: Lorriane Shire M.D.   On:  04/28/2016 12:37   Dg Hip Unilat W Or Wo Pelvis 2-3 Views Left  Result Date: 04/28/2016 CLINICAL DATA:  Worsening low back and left hip pain over the past 2 weeks with no new trauma. Patient has undergone recent back surgery and is undergone previous ORIF for an intertrochanteric fracture of the left hip. EXAM: DG HIP (WITH OR WITHOUT PELVIS) 2-3V LEFT COMPARISON:  AP view of the lumbar spine of January 10, 2016 which included portion of the left hip. FINDINGS: The bones are subjectively osteopenic. The patient has undergone previous posterior fusion at L4-5. The bony pelvis exhibits no lytic or blastic lesion. The patient has undergone ORIF of an intertrochanteric fracture of the left hip the metallic rods and  telescoping nail reveal no evidence of loosening. The left hip joint space is reasonably well-maintained. There is no acute fracture. IMPRESSION: There is no acute or significant chronic bony abnormality of the left hip joint. There are chronic changes from previous ORIF for an intertrochanteric fracture. No acute pelvic abnormality is observed. Electronically Signed   By: David  Martinique M.D.   On: 04/28/2016 09:40    Pertinent items noted in HPI and remainder of comprehensive ROS otherwise negative. Blood pressure (!) 152/72, pulse 74, temperature 97.8 F (36.6 C), temperature source Oral, resp. rate 16, height 5\' 8"  (1.727 m), weight 77.7 kg (171 lb 3.2 oz), SpO2 98 %. Patient is awake and alert. She appears mildly uncomfortable present. Her speech is fluent. Her judgment and insight are intact. Count nerve function is normal bilaterally. Motor examination of her upper and lower extremities are normal bilaterally. She does have some pain with straight leg raising on the left and also some pain with external rotation of her left hip. Examination of her lumbar spine reveals some diffuse tenderness. Chest and abdomen are benign. Extremities are free from injury or deformity. Peripheral pulses are  normal.  Assessment/Plan: Patient has significant left-sided lumbar radiculopathy. She does have elements of progressive disc degeneration with by foraminal disc protrusion with foraminal stenosis at L5-S1. She has chronic stenosis at L3-4. Also of note and possibly most significantly she has evidence of insufficiency fractures of her sacral ala particularly on the left side. This fracture directly abuts her left sciatic nerve within the lower pelvis.  Patient densely has significant back and radicular pain. It is unclear whether this pain is coming from her foraminal disc protrusion or from her sacral insufficiency fracture with subsequent sciatic nerve irritation. At this point I would not recommend any acute operative intervention. I would recommend IV Solu-Medrol as an anti-inflammatory agent and gradual mobilization with physical therapy in conjunction with narcotic analgesics. If this fails to improve the patient's situation she could consider having an epidural steroid injection. With regard to the sacral insufficiency fracture would not recommend sacroplasty at present.  Nelissa Bolduc A 04/28/2016, 8:41 PM

## 2016-04-28 NOTE — ED Triage Notes (Signed)
Per [facility] pt from home, pt has back effusion recently , pt reports recurrent lower back pain x 2 weeks. No fall nor injury . Ambulatory with assistance . Home nurse stated pt has new onset  urinary incontinence x 4 days , per [facility]. Received 50 mcg Fentanyl IV  By EMS

## 2016-04-28 NOTE — ED Notes (Signed)
Pt was unable to ambulate, had pt on a steady in order to go use restroom. Pt appeared in a lot pain and was agitated when assisted to standing position.

## 2016-04-29 ENCOUNTER — Telehealth (INDEPENDENT_AMBULATORY_CARE_PROVIDER_SITE_OTHER): Payer: Self-pay

## 2016-04-29 DIAGNOSIS — K5900 Constipation, unspecified: Secondary | ICD-10-CM | POA: Diagnosis present

## 2016-04-29 DIAGNOSIS — Z7982 Long term (current) use of aspirin: Secondary | ICD-10-CM | POA: Diagnosis not present

## 2016-04-29 DIAGNOSIS — M5117 Intervertebral disc disorders with radiculopathy, lumbosacral region: Secondary | ICD-10-CM | POA: Diagnosis present

## 2016-04-29 DIAGNOSIS — M5416 Radiculopathy, lumbar region: Secondary | ICD-10-CM

## 2016-04-29 DIAGNOSIS — M4848XA Fatigue fracture of vertebra, sacral and sacrococcygeal region, initial encounter for fracture: Secondary | ICD-10-CM | POA: Diagnosis present

## 2016-04-29 DIAGNOSIS — I10 Essential (primary) hypertension: Secondary | ICD-10-CM | POA: Diagnosis present

## 2016-04-29 DIAGNOSIS — M1990 Unspecified osteoarthritis, unspecified site: Secondary | ICD-10-CM | POA: Diagnosis present

## 2016-04-29 DIAGNOSIS — Z91041 Radiographic dye allergy status: Secondary | ICD-10-CM | POA: Diagnosis not present

## 2016-04-29 DIAGNOSIS — M545 Low back pain: Secondary | ICD-10-CM | POA: Diagnosis not present

## 2016-04-29 DIAGNOSIS — Z888 Allergy status to other drugs, medicaments and biological substances status: Secondary | ICD-10-CM | POA: Diagnosis not present

## 2016-04-29 DIAGNOSIS — M5417 Radiculopathy, lumbosacral region: Secondary | ICD-10-CM | POA: Diagnosis not present

## 2016-04-29 DIAGNOSIS — Z66 Do not resuscitate: Secondary | ICD-10-CM | POA: Diagnosis present

## 2016-04-29 DIAGNOSIS — Z9861 Coronary angioplasty status: Secondary | ICD-10-CM | POA: Diagnosis not present

## 2016-04-29 DIAGNOSIS — Z981 Arthrodesis status: Secondary | ICD-10-CM | POA: Diagnosis not present

## 2016-04-29 DIAGNOSIS — M5410 Radiculopathy, site unspecified: Secondary | ICD-10-CM | POA: Diagnosis not present

## 2016-04-29 DIAGNOSIS — E039 Hypothyroidism, unspecified: Secondary | ICD-10-CM | POA: Diagnosis present

## 2016-04-29 DIAGNOSIS — M8448XA Pathological fracture, other site, initial encounter for fracture: Secondary | ICD-10-CM | POA: Diagnosis not present

## 2016-04-29 DIAGNOSIS — Z515 Encounter for palliative care: Secondary | ICD-10-CM | POA: Diagnosis not present

## 2016-04-29 DIAGNOSIS — E785 Hyperlipidemia, unspecified: Secondary | ICD-10-CM | POA: Diagnosis present

## 2016-04-29 DIAGNOSIS — Z79899 Other long term (current) drug therapy: Secondary | ICD-10-CM | POA: Diagnosis not present

## 2016-04-29 DIAGNOSIS — G8929 Other chronic pain: Secondary | ICD-10-CM | POA: Diagnosis present

## 2016-04-29 MED ORDER — OXYCODONE HCL 5 MG PO TABS
5.0000 mg | ORAL_TABLET | Freq: Four times a day (QID) | ORAL | Status: DC | PRN
  Administered 2016-04-29 – 2016-04-30 (×2): 5 mg via ORAL
  Filled 2016-04-29 (×3): qty 1

## 2016-04-29 MED ORDER — ONDANSETRON HCL 4 MG/2ML IJ SOLN: 4.0000 mg | Freq: Four times a day (QID) | INTRAMUSCULAR | Status: DC | PRN

## 2016-04-29 MED ORDER — ENOXAPARIN SODIUM 40 MG/0.4ML ~~LOC~~ SOLN
40.0000 mg | SUBCUTANEOUS | Status: DC
  Administered 2016-04-29 – 2016-04-30 (×2): 40 mg via SUBCUTANEOUS
  Filled 2016-04-29 (×2): qty 0.4

## 2016-04-29 MED ORDER — METHYLPREDNISOLONE SODIUM SUCC 125 MG IJ SOLR
60.0000 mg | Freq: Two times a day (BID) | INTRAMUSCULAR | Status: DC
  Administered 2016-04-29 – 2016-05-01 (×5): 60 mg via INTRAVENOUS
  Filled 2016-04-29 (×5): qty 2

## 2016-04-29 MED ORDER — BISACODYL 10 MG RE SUPP
10.0000 mg | Freq: Once | RECTAL | Status: AC
  Administered 2016-04-29: 10 mg via RECTAL
  Filled 2016-04-29: qty 1

## 2016-04-29 MED ORDER — LEVOTHYROXINE SODIUM 100 MCG PO TABS
100.0000 ug | ORAL_TABLET | Freq: Every day | ORAL | Status: DC
  Administered 2016-04-30 – 2016-05-01 (×2): 100 ug via ORAL
  Filled 2016-04-29 (×2): qty 1

## 2016-04-29 NOTE — Evaluation (Signed)
Occupational Therapy Evaluation Patient Details Name: KENYOTA LIZAK MRN: [id number] DOB: 08-29-1924 Today's Date: 04/29/2016    History of Present Illness 83 yo female admitted with lumbar radicular pain, sacral stress fractures, L5-S1 disc protrusion. Hx of L hip ORIF, VP L1, spinal stenosis   Clinical Impression   Pt admitted with sacral stress fractures with pain.   Pt currently with functional limitations due to the deficits listed below (see OT Problem List). Pt will benefit from skilled OT to increase their safety and independence with ADL and functional mobility for ADL to facilitate discharge to venue listed below.     Follow Up Recommendations  Home health OT;SNF;Supervision/Assistance - 24 hour    Equipment Recommendations  3 in 1 bedside commode    Recommendations for Other Services       Precautions / Restrictions Precautions Precautions: Fall Precaution Comments: logroll for pain control Restrictions Weight Bearing Restrictions: No      Mobility Bed Mobility Overal bed mobility: Needs Assistance Bed Mobility: Rolling;Sidelying to Sit;Sit to Sidelying Rolling: Min assist Sidelying to sit: HOB elevated;Mod assist     Sit to sidelying: Mod assist General bed mobility comments: Assist for LEs.   Transfers Overall transfer level: Needs assistance Equipment used: Rolling walker (2 wheeled) Transfers: Sit to/from [facility] Sit to Stand: From elevated surface;Min assist Stand pivot transfers: Min assist       General transfer comment: bed to chair and back to bed          ADL Overall ADL's : Needs assistance/impaired Eating/Feeding: Sitting;Set up               Upper Body Dressing : Moderate assistance   Lower Body Dressing: Total assistance;Sit to/from stand;Cueing for safety;Cueing for sequencing   Toilet Transfer: Moderate assistance;RW;Ambulation Toilet Transfer Details (indicate cue type and reason): bed to chair and  back to bed Toileting- Clothing Manipulation and Hygiene: Total assistance;Cueing for sequencing;Cueing for safety Toileting - Clothing Manipulation Details (indicate cue type and reason): pt unable to let go of walker for hygiene       General ADL Comments: pt will need significant A home. Pt states son will A. If son not able will likely need SNF               Pertinent Vitals/Pain Pain Assessment: 0-10 Pain Score: 8  Pain Location: L hip area Pain Descriptors / Indicators: Radiating;Sharp;Grimacing;Aching Pain Intervention(s): Monitored during session;Limited activity within patient's tolerance;Repositioned        Extremity/Trunk Assessment Upper Extremity Assessment Upper Extremity Assessment: Generalized weakness   Lower Extremity Assessment Lower Extremity Assessment: Generalized weakness (unable to fully assess due to pain)   Cervical / Trunk Assessment Cervical / Trunk Assessment: Kyphotic   Communication Communication Communication: HOH   Cognition Arousal/Alertness: Awake/alert Behavior During Therapy: WFL for tasks assessed/performed Overall Cognitive Status: Within Functional Limits for tasks assessed                                Home Living Family/patient expects to be discharged to:: Private residence Living Arrangements: Children (lives with son) Available Help at Discharge: Family Type of Home: House Home Access: Stairs to enter Technical brewer of Steps: 1   Home Layout: One level               Home Equipment: Environmental consultant - 2 wheels          Prior Functioning/Environment  Level of Independence: Independent with assistive device(s)                 OT Problem List: Decreased strength;Decreased activity tolerance;Pain   OT Treatment/Interventions: Self-care/ADL training    OT Goals(Current goals can be found in the care plan section) Acute Rehab OT Goals Patient Stated Goal: less pain. home OT Goal Formulation:  With patient Time For Goal Achievement: 05/13/16 Potential to Achieve Goals: Good  OT Frequency: Min 2X/week           Co-evaluation              End of Session Equipment Utilized During Treatment: Rolling walker Nurse Communication: Mobility status  Activity Tolerance: Patient limited by pain Patient left: with call bell/phone within reach;in bed   Time: 1310-1335 OT Time Calculation (min): 25 min Charges:  OT General Charges $OT Visit: 1 Procedure OT Evaluation $OT Eval Moderate Complexity: 1 Procedure OT Treatments $Self Care/Home Management : 8-22 mins G-Codes: OT G-codes **NOT FOR INPATIENT CLASS** Functional Assessment Tool Used: clinical observation Functional Limitation: Self care Self Care Current Status [id number]): At least 80 percent but less than 100 percent impaired, limited or restricted Self Care Goal Status [id number]): At least 1 percent but less than 20 percent impaired, limited or restricted  Christiana, Thereasa Parkin 04/29/2016, 1:59 PM

## 2016-04-29 NOTE — Progress Notes (Addendum)
Spoke with patient and son at bedside. Patient states she has had back issues for a long time. She went to SNF after her hip surgery but was not pleased with that and does not want to go to SNF again. She also had HH PT at one time but does not think she would benefit from that either, she and her son feel they will be able to manage mobility. She would like a hospital bed and wants to have a palliative consult for pain control. They are also requesting resources for a PCP who will make home visits, provided them with a list. Will continue to follow for [facility] needs.   Received a callback from the patient's son, they have decided to proceed with HHPT, have chosen [place] for [facility] services. Contacted [facility] for referral, they will follow for d/c needs.

## 2016-04-29 NOTE — Progress Notes (Signed)
Brittney NOTE    Brittney Gates  [id number] DOB: 09/08/1924 DOA: 04/28/2016 PCP: [name], Brittney    Brief Narrative:  Brittney Gates is a 83 y.o. female with medical history significant of HTN, chronic back pain, HLD, anxiety and arthritis that presents for severe lower back pain and loss of continence. Patient states that she has struggled with back pain since breaking her hip and rehabing through that (Brittney 2013).   Patient admitted with worsening back pain, uncontrolled pain at home.   Assessment & Plan:   Principal Problem:   Lumbar radicular pain Active Problems:   Hypothyroidism   Hyperlipidemia   Essential hypertension   LOW BACK PAIN, CHRONIC   Radicular low back pain  1-Lumbar radicular pain;  -MRI showing New stress fractures of the sacrum. New left foraminal and extraforaminal disc protrusion at L5-S1 with a mass effect upon the left L5 nerve. Moderately severe spinal stenosis at L1-2, unchanged. Compression of the thecal sac at L3-4 by fat at the site of the laminotomy defect -Appreciate Dr Annette Stable evaluation.  -Patient unable to be discharge home today due to uncontrolled pain, she is still requiring IV dilaudid. Will also start IV solumedrol today. She thought she had allergic reaction to decadron injection. I called Dr Ernestina Patches office, patient received contrast and Sensorcaine during decadron injection. Discussed with son, he agree with [place] trying IV solumedrol.  -PT evaluation.  -start Oxycodone,. Will try oral pain medication to find good regimen to discharge patient home/   HTN - Continue with amlodipine and benazapril   Hypothyroidism -TSH normal.  - resume home dose.   Constipation - had small BM last night.  -will order dulcolax suppository today.    DVT prophylaxis: lovenox Code Status: DNR Family Communication: Son over the phone  Disposition Plan: start IV solumedrol, try alternate oral and IV pain medication.   Consultants:    Neurosurgery    Procedures:   none   Antimicrobials:   none   Subjective: Patient report severe pain, inability to move or walk due to pain.  Plan to work with PT today, but needs IV pain medication prior to evaluation.  Report nausea when she takes tramadol, has try oxycodone in the past.   Objective: Vitals:   04/28/16 1700 04/28/16 1955 04/29/16 0539 04/29/16 0855  BP: (!) 152/72 (!) 147/63 (!) 131/52 (!) 131/56  Pulse: 74 65 75   Resp: 16 18 18    Temp: 97.8 F (36.6 C) 97.8 F (36.6 C) 97.8 F (36.6 C)   TempSrc: Oral Oral Oral   SpO2: 98% 97% 98%   Weight:      Height:        Intake/Output Summary (Last 24 hours) at 04/29/16 1120 Last data filed at 04/28/16 1800  Gross per 24 hour  Intake              120 ml  Output                0 ml  Net              120 ml   Filed Weights   04/28/16 1659  Weight: 77.7 kg (171 lb 3.2 oz)    Examination:  General exam: mild distress due to pain.  Respiratory system: Clear to auscultation. Respiratory effort normal. Cardiovascular system: S1 & S2 heard, RRR. No JVD, murmurs, rubs, gallops or clicks. No pedal edema. Gastrointestinal system: Abdomen is nondistended, soft and nontender. No organomegaly or masses  felt. Normal bowel sounds heard. Central nervous system: Alert and oriented.  Extremities; moves extremities passively.  Skin: No rashes, lesions or ulcers     Data Reviewed: I have personally reviewed following labs and imaging studies  CBC:  Recent Labs Lab 04/28/16 0918 04/28/16 1736  WBC  --  10.4  HGB 11.2* 10.1*  HCT 33.0* 31.0*  MCV  --  99.0  PLT  --  [id number]   Basic Metabolic Panel:  Recent Labs Lab 04/28/16 0918  NA 139  K 4.7  CL 77*  GLUCOSE 105*  BUN 15  CREATININE 1.00   GFR: Estimated Creatinine Clearance: 41.8 mL/min (by C-G formula based on SCr of 1 mg/dL). Liver Function Tests: No results for input(s): AST, ALT, ALKPHOS, BILITOT, PROT, ALBUMIN in the last 168  hours. No results for input(s): LIPASE, AMYLASE in the last 168 hours. No results for input(s): AMMONIA in the last 168 hours. Coagulation Profile: No results for input(s): INR, PROTIME in the last 168 hours. Cardiac Enzymes: No results for input(s): CKTOTAL, CKMB, CKMBINDEX, TROPONINI in the last 168 hours. BNP (last 3 results) No results for input(s): PROBNP in the last 8760 hours. HbA1C: No results for input(s): HGBA1C in the last 72 hours. CBG: No results for input(s): GLUCAP in the last 168 hours. Lipid Profile: No results for input(s): CHOL, HDL, LDLCALC, TRIG, CHOLHDL, LDLDIRECT in the last 72 hours. Thyroid Function Tests:  Recent Labs  04/28/16 1738  TSH 3.983   Anemia Panel: No results for input(s): VITAMINB12, FOLATE, FERRITIN, TIBC, IRON, RETICCTPCT in the last 72 hours. Sepsis Labs: No results for input(s): PROCALCITON, LATICACIDVEN in the last 168 hours.  No results found for this or any previous visit (from the past 240 hour(s)).       Radiology Studies: Mr Lumbar Spine W Wo Contrast  Result Date: 04/28/2016 CLINICAL DATA:  Recurrent severe low back pain. New onset of urinary incontinence. EXAM: MRI LUMBAR SPINE WITHOUT AND WITH CONTRAST TECHNIQUE: Multiplanar and multiecho pulse sequences of the lumbar spine were obtained without and with intravenous contrast. CONTRAST:  84mL MULTIHANCE GADOBENATE DIMEGLUMINE 529 MG/ML IV SOLN COMPARISON:  MRI dated 01/29/2016 and CT scan dated 10/15/2015 FINDINGS: Segmentation:  Standard. Alignment:  Physiologic. Vertebrae: Old compression fracture of L1 treated with vertebroplasty. Vertical stress fractures of both sacral ala as well as a transverse stress fracture through inferior aspect of the S1 segment of the sacrum. Conus medullaris: Extends to the  T12-L1 level and appears normal. Paraspinal and other soft tissues: Diverticulosis of the distal colon. Disc levels: T12-L1: No protrusion or bulging of the disc. Slight chronic  protrusion of the posterosuperior aspect of the L1 vertebral body into the spinal canal without neural impingement. Old healed L1 fracture. L1-2: Schmorl's nodes in the endplates at [id number]. No disc bulging or protrusion. Hypertrophy of the ligamentum flavum and facet joints combine to create moderately severe spinal stenosis without focal neural impingement. L2-3: Small broad-based disc bulge most prominent into the left neural foramen. Short pedicles and slight hypertrophy of the ligamentum flavum and facet joints combine to create slight spinal stenosis without focal neural impingement L3-4: Marked disc space narrowing with broad-based endplate osteophytes and disc bulging. The thecal sac is compressed by fat and laminectomy defect. Right extraforaminal disc protrusion appears to have a slight mass effect upon the exiting right L3 nerve. Left foraminal protrusion also of appears to have a slight mass effect upon the left L3 nerve. L4-5: Posterior fusion with excellent posterior decompression.  No pathologic enhancement after contrast administration. L5-S1: Progressive severe degenerative disc disease with new fluid in the disc space with a chronic broad-based soft disc protrusion with new protrusion into the left neural foramen which appears to have a mass effect upon the left L5 nerve. IMPRESSION: 1. New stress fractures of the sacrum. 2. New left foraminal and extraforaminal disc protrusion at L5-S1 with a mass effect upon the left L5 nerve. 3. Moderately severe spinal stenosis at L1-2, unchanged. 4. Compression of the thecal sac at L3-4 by fat at the site of the laminotomy defect. Electronically Signed   By: Lorriane Shire M.D.   On: 04/28/2016 12:37   Dg Hip Unilat W Or Wo Pelvis 2-3 Views Left  Result Date: 04/28/2016 CLINICAL DATA:  Worsening low back and left hip pain over the past 2 weeks with no new trauma. Patient has undergone recent back surgery and is undergone previous ORIF for an intertrochanteric  fracture of the left hip. EXAM: DG HIP (WITH OR WITHOUT PELVIS) 2-3V LEFT COMPARISON:  AP view of the lumbar spine of Brittney 12, 2017 which included portion of the left hip. FINDINGS: The bones are subjectively osteopenic. The patient has undergone previous posterior fusion at L4-5. The bony pelvis exhibits no lytic or blastic lesion. The patient has undergone ORIF of an intertrochanteric fracture of the left hip the metallic rods and telescoping nail reveal no evidence of loosening. The left hip joint space is reasonably well-maintained. There is no acute fracture. IMPRESSION: There is no acute or significant chronic bony abnormality of the left hip joint. There are chronic changes from previous ORIF for an intertrochanteric fracture. No acute pelvic abnormality is observed. Electronically Signed   By: David  Martinique M.D.   On: 04/28/2016 09:40        Scheduled Meds: . amLODipine  5 mg Oral Daily  . benazepril  20 mg Oral Daily  . bisacodyl  10 mg Rectal Once  . nepafenac  1 drop Both Eyes BID  . senna-docusate  2 tablet Oral BID  . zolpidem  5 mg Oral QHS   Continuous Infusions:   LOS: 0 days    Time spent: 35 minutes.    Elmarie Shiley, Brittney [facility] Pager [phone]  If 7PM-7AM, please contact night-coverage [email or web address] Password [email or web address] 04/29/2016, 11:20 AM

## 2016-04-29 NOTE — Telephone Encounter (Signed)
Patient at [facility] with severe pain in her back. Bulging L5 disc, also with a sacral fracture. He said the hospital told them to call and let Sharol Given know ASAP. Patients son wants Sharol Given to call him and tell him what to do from here [phone]

## 2016-04-29 NOTE — Evaluation (Signed)
Physical Therapy Evaluation Patient Details Name: Brittney Gates MRN: [id number] DOB: 02/27/1925 Today's Date: 04/29/2016   History of Present Illness  83 yo female admitted with lumbar radicular pain, sacral stress fractures, L5-S1 disc protrusion. Hx of L hip ORIF, VP L1, spinal stenosis  Clinical Impression  On eval, pt required Min assist (+2 for safety) for mobility after IV pre-medication. She was able to walk ~20 feet with a RW. Pain rated 7/10 during session. Discussed d/c plan-pt declined SNF placement during PT eval. Pt stated she would return home and that her son could provide 24 hour care. Will continue to follow and progress activity as tolerated. If pt will not have 24 hour care, she will very likely need to consider SNF placement.     Follow Up Recommendations Home health PT;Supervision/Assistance - 24 hour (pt declined SNF placement during PT session)    Equipment Recommendations  3in1 (PT) (if pt doesn't already have one)    Recommendations for Other Services OT consult     Precautions / Restrictions Precautions Precautions: Fall Precaution Comments: logroll for pain control Restrictions Weight Bearing Restrictions: No      Mobility  Bed Mobility Overal bed mobility: Needs Assistance Bed Mobility: Rolling;Sidelying to Sit;Sit to Sidelying Rolling: Modified independent (Device/Increase time) Sidelying to sit: Min assist;HOB elevated     Sit to sidelying: Min assist General bed mobility comments: Assist for LEs. Cues for logroll technique. Pt requested HOB elevated in sidelying to get to sitting. Increased time.   Transfers Overall transfer level: Needs assistance Equipment used: Rolling walker (2 wheeled) Transfers: Sit to/from [facility] Sit to Stand: From elevated surface;Min assist Stand pivot transfers: Min assist       General transfer comment: VCs safety, technique, hand placement. Assist to rise, stabilize, control descent.  Increased time.  Ambulation/Gait Ambulation/Gait assistance: Min assist Ambulation Distance (Feet): 20 Feet Assistive device: Rolling walker (2 wheeled) Gait Pattern/deviations: Step-to pattern;Antalgic     General Gait Details: assist to stabilize. slow, start/stop gait pattern.    Stairs            Wheelchair Mobility    Modified Rankin (Stroke Patients Only)       Balance                                             Pertinent Vitals/Pain Pain Assessment: 0-10 Pain Score: 7  Pain Location: back, L LE Pain Descriptors / Indicators: Radiating;Sharp;Grimacing Pain Intervention(s): Premedicated before session;Repositioned;Limited activity within patient's tolerance    Home Living Family/patient expects to be discharged to:: Unsure Living Arrangements: Children (lives with son) Available Help at Discharge: Family Type of Home: House Home Access: Stairs to enter   Technical brewer of Steps: 1 Home Layout: One level Home Equipment: Environmental consultant - 2 wheels      Prior Function Level of Independence: Independent with assistive device(s)               Hand Dominance        Extremity/Trunk Assessment   Upper Extremity Assessment Upper Extremity Assessment: Overall WFL for tasks assessed    Lower Extremity Assessment Lower Extremity Assessment: Generalized weakness (unable to fully assess due to pain)    Cervical / Trunk Assessment Cervical / Trunk Assessment: Kyphotic  Communication   Communication: HOH  Cognition Arousal/Alertness: Awake/alert Behavior During Therapy: WFL for tasks assessed/performed Overall  Cognitive Status: Within Functional Limits for tasks assessed                      General Comments      Exercises     Assessment/Plan    PT Assessment Patient needs continued PT services  PT Problem List Decreased mobility;Decreased activity tolerance;Decreased balance;Decreased knowledge of use of  DME;Pain          PT Treatment Interventions DME instruction;Gait training;Therapeutic activities;Therapeutic exercise;Patient/family education;Functional mobility training;Balance training    PT Goals (Current goals can be found in the Care Plan section)  Acute Rehab PT Goals Patient Stated Goal: less pain. home PT Goal Formulation: With patient Time For Goal Achievement: 05/13/16 Potential to Achieve Goals: Good    Frequency Min 3X/week   Barriers to discharge        Co-evaluation               End of Session   Activity Tolerance: Patient limited by pain Patient left: in bed;with call bell/phone within reach;with bed alarm set      Functional Assessment Tool Used: clinical judgement Functional Limitation: Mobility: Walking and moving around Mobility: Walking and Moving Around Current Status [id number]): At least 20 percent but less than 40 percent impaired, limited or restricted Mobility: Walking and Moving Around Goal Status [phone]): At least 1 percent but less than 20 percent impaired, limited or restricted    Time: [id number] PT Time Calculation (min) (ACUTE ONLY): 20 min   Charges:   PT Evaluation $PT Eval Low Complexity: 1 Procedure     PT G Codes:   PT G-Codes **NOT FOR INPATIENT CLASS** Functional Assessment Tool Used: clinical judgement Functional Limitation: Mobility: Walking and moving around Mobility: Walking and Moving Around Current Status [id number]): At least 20 percent but less than 40 percent impaired, limited or restricted Mobility: Walking and Moving Around Goal Status [phone]): At least 1 percent but less than 20 percent impaired, limited or restricted    Weston Anna, MPT Pager: [phone]

## 2016-04-29 NOTE — Telephone Encounter (Signed)
I called and sw pt's son and the pt has been evaluated and treated while in the hospital with neurosurgical group. Advised that Dr. Sharol Given is aware of plan and treatment and is in agreement with what has been suggested. The pt's son voiced understanding and will call with questions.

## 2016-04-30 DIAGNOSIS — M8448XG Pathological fracture, other site, subsequent encounter for fracture with delayed healing: Secondary | ICD-10-CM

## 2016-04-30 DIAGNOSIS — M5417 Radiculopathy, lumbosacral region: Secondary | ICD-10-CM

## 2016-04-30 DIAGNOSIS — Z515 Encounter for palliative care: Secondary | ICD-10-CM

## 2016-04-30 DIAGNOSIS — M8448XA Pathological fracture, other site, initial encounter for fracture: Secondary | ICD-10-CM

## 2016-04-30 LAB — BASIC METABOLIC PANEL
Anion gap: 10 (ref 5–15)
BUN: 28 mg/dL — ABNORMAL HIGH (ref 6–20)
CO2: 26 mmol/L (ref 22–32)
Calcium: 9.5 mg/dL (ref 8.9–10.3)
Chloride: 103 mmol/L (ref 101–111)
Creatinine, Ser: 0.87 mg/dL (ref 0.44–1.00)
GFR calc Af Amer: >60 mL/min (ref >60)
GFR calc non Af Amer: 57 mL/min — ABNORMAL LOW (ref >60)
Glucose, Bld: 162 mg/dL — ABNORMAL HIGH (ref 65–99)
Potassium: 4.5 mmol/L (ref 3.5–5.1)
Sodium: 139 mmol/L (ref 135–145)

## 2016-04-30 MED ORDER — OXYCODONE HCL 5 MG PO TABS
5.0000 mg | ORAL_TABLET | ORAL | Status: DC | PRN
  Administered 2016-04-30 – 2016-05-01 (×4): 5 mg via ORAL
  Filled 2016-04-30 (×4): qty 1

## 2016-04-30 MED ORDER — OXYCODONE HCL ER 10 MG PO T12A
10.0000 mg | EXTENDED_RELEASE_TABLET | Freq: Two times a day (BID) | ORAL | Status: DC
  Administered 2016-04-30 – 2016-05-01 (×2): 10 mg via ORAL
  Filled 2016-04-30 (×2): qty 1

## 2016-04-30 MED ORDER — POLYETHYLENE GLYCOL 3350 17 G PO PACK
17.0000 g | PACK | Freq: Every day | ORAL | Status: DC
  Filled 2016-04-30: qty 1

## 2016-04-30 MED ORDER — OXYCODONE HCL 5 MG PO TABS: 5.0000 mg | ORAL_TABLET | Freq: Four times a day (QID) | ORAL | Status: DC | PRN

## 2016-04-30 NOTE — Consult Note (Signed)
Consultation Note Date: 04/30/2016   Patient Name: Brittney Gates  DOB: 1924-06-25  MRN: [id number]  Age / Sex: 83 y.o., female  PCP: [name], MD Referring Physician: Elmarie Shiley, MD  Reason for Consultation: Pain control and Psychosocial/spiritual support  HPI/Patient Profile: 83 y.o. female admitted on 04/28/2016 with PMH significant for complicated back pain.  She has had multiple epidural injections and prior lumbar surgery with L4-5 and L5-S1  fusion.   She reports she  had laminectomy at L3-4. She has had kyphoplasty in the pastShe had previous care at [facility] and most recently was under the care of Dr.Duda for left hip fx in 2013.  Marland Kitchen Her PCP is Dr Sharlet Salina on [facility].  Her chronic back pain has worsened over the past several months and currently it has basically debilitated her.  She is unable to perform ADLs  The worst pain she reports  is "nerve"  pain in the legs. She was seen by neurosurgery here IP, no procedures recommended at this time. She was stated on IV solu-medrol.   She is unable to tolerate Tylenol or Gabapentin.  IV Dilaudid has been utilized here in the hospital effectively but we discussed important of shifting to oral agents for successful transition home.   Clinical Assessment and Goals of Care:   This NP Wadie Lessen reviewed medical records, received report from team, assessed the patient and then meet at the patient's bedside along with her son/ John  to discuss diagnosis, treatment options, pain management strategies, anticipatory care needs.  Detailed conversation regarding strategies to augment medications for pain management.  Patient agrees to PT in the home.  We discussed breathing exercises and meditation practices.  Patient tells me prayer is incorporated into her daily practices  A discussion was had today regarding advanced directives and anticipatory care  needs.     Values and goals of care important to patient and family were attempted to be elicited.  Patient verbalizes her desire for quality life.  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  Questions and concerns addressed.   Family encouraged to call with questions or concerns.  PMT will continue to support holistically.   SUMMARY OF RECOMMENDATIONS    -  discontinue IV Dilaudid -  Oxycodone ER 10 mg po every 12 hours -  Oxycodone IR 5 mg po every 4 hrs prn breakthrough pain -  discharge home tomorrow non-emergent transport - Home health for PT/OT/ bath aides, family tells me they have chosen [place] - begin services with a House call provider.  Back to Basics was discussed and I spoke with the NP Alvester Chou and she is feels she will be able to manage chronic health care needs of this patient in the home.  (Family was given the number to contact) -family will begin to investigate additional in home caregivers for anticipatory increased needs   Code Status/Advance Care Planning:  DNR   Palliative Prophylaxis:   Bowel Regimen, Delirium Protocol  and Frequent Pain Assessment  Discharge Planning: Home with Home Health      Primary Diagnoses: Present on Admission: . Lumbar radicular pain . Hypothyroidism . Hyperlipidemia . Essential hypertension . LOW BACK PAIN, CHRONIC . Radicular low back pain   I have reviewed the medical record, interviewed the patient and family, and examined the patient. The following aspects are pertinent.  Past Medical History:  Diagnosis Date  . Acute pancreatitis   . ANEMIA   . ANXIETY   . Arthritis   . Chronic anemia   . Diverticulosis   . Gastritis   . Hemorrhoids   . Hiatal hernia   . HYPERLIPIDEMIA   . HYPERTENSION   . HYPOTHYROIDISM   . LOW BACK PAIN, CHRONIC   . PAD (peripheral artery disease) (HCC)    Social History   Social History  . Marital status: Widowed    Spouse name: N/A  .  Number of children: N/A  . Years of education: N/A   Social History Main Topics  . Smoking status: Never Smoker  . Smokeless tobacco: Never Used  . Alcohol use No  . Drug use: No  . Sexual activity: Not Asked   Other Topics Concern  . 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 Lung cancer Father   . Gallbladder disease Son   . Gallbladder disease Daughter   . Colon cancer Neg Hx    Scheduled Meds: . amLODipine  5 mg Oral Daily  . benazepril  20 mg Oral Daily  . enoxaparin (LOVENOX) injection  40 mg Subcutaneous Q24H  . levothyroxine  100 mcg Oral QAC breakfast  . methylPREDNISolone (SOLU-MEDROL) injection  60 mg Intravenous Q12H  . nepafenac  1 drop Both Eyes BID  . senna-docusate  2 tablet Oral BID  . zolpidem  5 mg Oral QHS   Continuous Infusions: PRN Meds:.acetaminophen, HYDROmorphone (DILAUDID) injection, ondansetron (ZOFRAN) IV, oxyCODONE, senna-docusate Medications Prior to Admission:  Prior to Admission medications   Medication Sig Start Date End Date Taking? Authorizing Provider  amLODipine-benazepril (LOTREL) 5-20 MG capsule TAKE 1 CAPSULE BY MOUTH DAILY. 03/03/16  Yes [name], MD  aspirin EC 81 MG tablet Take 81 mg by mouth daily.   Yes Historical Provider, MD  calcium carbonate (OS-CAL) 600 MG TABS Take 600 mg by mouth 2 (two) times daily with a meal. Reported on 10/05/2015   Yes Historical Provider, MD  Cholecalciferol 2000 UNITS TABS Take 2,000 Units by mouth daily.   Yes Historical Provider, MD  docusate sodium (COLACE) 100 MG capsule Take 100 mg by mouth at bedtime as needed for mild constipation.   Yes Historical Provider, MD  levothyroxine (SYNTHROID, LEVOTHROID) 100 MCG tablet TAKE ONE TABLET BY MOUTH DAILY BEFORE  BREAKFAST Patient taking differently: TAKE 100mg  TABLET BY MOUTH DAILY BEFORE BREAKFAST 03/12/16  Yes [name], MD  Multiple Vitamin (MULTIVITAMIN WITH MINERALS) TABS tablet Take 1 tablet by mouth daily.   Yes Historical Provider, MD  naproxen (NAPROSYN) 500 MG tablet TAKE ONE TABLET BY MOUTH TWICE DAILY WITH FOOD AS NEEDED FOR PAIN ANDINFLAMMATION Patient taking differently: TAKE 500mg  TABLET BY MOUTH TWICE DAILY WITH FOOD AS NEEDED FOR PAIN ANDINFLAMMATION 04/28/16  Yes Mcarthur Rossetti, MD  nepafenac (ILEVRO) 0.3 % ophthalmic suspension Place 1 drop into both eyes 2 (two) times daily.   Yes Historical Provider, MD  oxyCODONE (OXY IR/ROXICODONE) 5 MG immediate release tablet Take 5 mg by mouth every 4 (four) hours as needed for pain. 04/25/16  Yes Historical Provider, MD  sennosides-docusate sodium (SENOKOT-S) 8.6-50 MG tablet Take 2 tablets by mouth at bedtime as needed for constipation.   Yes Historical Provider, MD  zolpidem (AMBIEN) 10 MG tablet Take 1 tablet (10 mg total) by mouth at bedtime as needed for sleep. Patient taking differently: Take 5-10 mg by mouth at bedtime. Pt takes 5mg  right at bedtime, and then another 5mg  after she wakes up to use the restroom in the middle of the night 12/17/15  Yes [name], MD   Allergies  Allergen Reactions  . Statins Other (See Comments)    Leg weakness  . Contrast Media [Iodinated Diagnostic Agents]     Rash . Received contrast at orthopedic office for steroid injection. Had rash after that   . Dexamethasone Itching and Rash   Review of Systems  Constitutional: Positive for activity change and fatigue.  Musculoskeletal: Positive for back pain and gait problem.  Neurological: Positive for weakness.    Physical Exam  Constitutional: She appears well-developed.  HENT:  Mouth/Throat: Oropharynx is clear and moist.  Cardiovascular: Normal rate, regular rhythm and normal heart sounds.   Pulmonary/Chest: Effort normal  and breath sounds normal.  Musculoskeletal:  -BLE weakness 2/2 to back pain  Neurological: She is alert.  Skin: Skin is warm and dry.    Vital Signs: BP (!) 151/69 (BP Location: Right Arm)   Pulse 73   Temp 98 F (36.7 C) (Oral)   Resp 18   Ht 5\' 8"  (1.727 m)   Wt 77.7 kg (171 lb 3.2 oz)   SpO2 95%   BMI 26.03 kg/m  Pain Assessment: 0-10   Pain Score: 7    SpO2: SpO2: 95 % O2 Device:SpO2: 95 % O2 Flow Rate: .   IO: Intake/output summary:  Intake/Output Summary (Last 24 hours) at 04/30/16 [id number] Last data filed at 04/29/16 1500  Gross per 24 hour  Intake              120 ml  Output                2 ml  Net              118 ml    LBM: Last BM Date: 04/29/16 Baseline Weight: Weight: 77.7 kg (171 lb 3.2 oz) Most recent weight: Weight: 77.7 kg (171 lb 3.2 oz)      Palliative Assessment/Data: 40 % at best   Flowsheet Rows   Flowsheet Row Most Recent Value  Intake Tab  Referral Department  Hospitalist  Unit at Time of Referral  Med/Surg Unit  Palliative Care Primary Diagnosis  Pain  Date Notified  04/29/16  Palliative Care Type  New Palliative care  Reason for referral  Pain  Date of Admission  04/28/16  # of days IP prior to Palliative referral  1  Clinical Assessment  Psychosocial & Spiritual Assessment  Palliative Care Outcomes     Discussed with Dr Maceo Pro  Time In: 1400 Time Out: 1600 Time Total: 120 min Greater than 50%  of this time was spent counseling and coordinating care related to the above assessment and plan.  Signed by: Wadie Lessen, NP   Please contact Palliative Medicine Team phone  at [phone] for questions and concerns.  For individual provider: See Shea Evans

## 2016-04-30 NOTE — Progress Notes (Signed)
Occupational Therapy Treatment Patient Details Name: Brittney Gates MRN: [id number] DOB: 1925/03/23 Today's Date: 04/30/2016    History of present illness 83 yo female admitted with lumbar radicular pain, sacral stress fractures, L5-S1 disc protrusion. Hx of L hip ORIF, VP L1, spinal stenosis   OT comments  Palliative care mtg after OT session  Follow Up Recommendations  Home health OT;Supervision/Assistance - 24 hour    Equipment Recommendations  3 in 1 bedside commode    Recommendations for Other Services      Precautions / Restrictions Precautions Precautions: Fall Precaution Comments: logroll for pain control Restrictions Weight Bearing Restrictions: No       Mobility Bed Mobility Overal bed mobility: Needs Assistance Bed Mobility: Rolling;Sidelying to Sit Rolling: Min assist Sidelying to sit: HOB elevated;Mod assist       General bed mobility comments: Assist for LEs.   Transfers Overall transfer level: Needs assistance Equipment used: Rolling walker (2 wheeled) Transfers: Sit to/from [facility] Sit to Stand: From elevated surface;Min assist Stand pivot transfers: Min assist       General transfer comment: bed to chair        ADL Overall ADL's : Needs assistance/impaired                Pt agreed to OOB for palliative care meeting.  Pt states she had been on BSC earlier and did not need to go.Son confirmed they would need a BSC for home.                     Functional mobility during ADLs: Moderate assistance;Cueing for safety;Cueing for sequencing;Rolling walker General ADL Comments: son present  for OT session.  Discussed need for A with ADL activity.  Pt will need a BSC.                  Cognition   Behavior During Therapy: WFL for tasks assessed/performed Overall Cognitive Status: Within Functional Limits for tasks assessed                                    Pertinent Vitals/ Pain       Pain  Score: 6  Pain Location: L hip area Pain Descriptors / Indicators: Radiating;Sharp;Grimacing;Aching Pain Intervention(s): Monitored during session;Repositioned         Frequency  Min 2X/week        Progress Toward Goals  OT Goals(current goals can now be found in the care plan section)  Progress towards OT goals: Progressing toward goals     Plan Discharge plan remains appropriate    Co-evaluation                 End of Session Equipment Utilized During Treatment: Rolling walker   Activity Tolerance Patient limited by pain   Patient Left with call bell/phone within reach;in bed   Nurse Communication Mobility status        Time: [id number] OT Time Calculation (min): 12 min  Charges: OT Treatments $Self Care/Home Management : 8-22 mins  Hisham Provence D 04/30/2016, 2:47 PM

## 2016-04-30 NOTE — Progress Notes (Signed)
PROGRESS NOTE    Brittney Gates  [id number] DOB: June 21, 1924 DOA: 04/28/2016 PCP: [name], MD    Brief Narrative:  Brittney Gates is a 83 y.o. female with medical history significant of HTN, chronic back pain, HLD, anxiety and arthritis that presents for severe lower back pain and loss of continence. Patient states that she has struggled with back pain since breaking her hip and rehabing through that (October 2013).   Patient admitted with worsening back pain, uncontrolled pain at home.   Assessment & Plan:   Principal Problem:   Lumbar radicular pain Active Problems:   Hypothyroidism   Hyperlipidemia   Essential hypertension   LOW BACK PAIN, CHRONIC   Radicular low back pain  1-Lumbar radicular pain;  -MRI showing New stress fractures of the sacrum. New left foraminal and extraforaminal disc protrusion at L5-S1 with a mass effect upon the left L5 nerve. Moderately severe spinal stenosis at L1-2, unchanged. Compression of the thecal sac at L3-4 by fat at the site of the laminotomy defect -Appreciate Dr Annette Stable evaluation. Medical management.  -PT evaluation.  -Continue with  Oxycodone. Change to one to two tablets PRN.  -I will continue with IV solumedrol, today, some improvement of pain, patient still complaining of numbness LE.  -palliative care consulted for pain management.   HTN - Continue with amlodipine and benazapril   Hypothyroidism -TSH normal.  -Continue with Synthroid.   Constipation - had small BM last night.  -Bowel regimen.    DVT prophylaxis: lovenox Code Status: DNR Family Communication: Son over the phone  Disposition Plan: remain inpatient for IV solumedrol, pain management, palliative care consulted for pain management.   Consultants:   Neurosurgery    Procedures:   none   Antimicrobials:   none   Subjective: Report pain as 6/10 with movement.  Still complaining of numbness LE>   Objective: Vitals:   04/29/16 0855  04/29/16 1400 04/29/16 2056 04/30/16 0610  BP: (!) 131/56 (!) 142/67 (!) 143/62 (!) 151/69  Pulse:  71 73 73  Resp:  18 18 18   Temp:  97.8 F (36.6 C) 98.4 F (36.9 C) 98 F (36.7 C)  TempSrc:  Oral Oral Oral  SpO2:  97% 95% 95%  Weight:      Height:        Intake/Output Summary (Last 24 hours) at 04/30/16 1343 Last data filed at 04/30/16 0934  Gross per 24 hour  Intake              240 ml  Output                2 ml  Net              238 ml   Filed Weights   04/28/16 1659  Weight: 77.7 kg (171 lb 3.2 oz)    Examination:  General exam: mild distress due to pain.  Respiratory system: Clear to auscultation. Respiratory effort normal. Cardiovascular system: S1 & S2 heard, RRR. No JVD, murmurs, rubs, gallops or clicks. No pedal edema. Gastrointestinal system: Abdomen is nondistended, soft and nontender. No organomegaly or masses felt. Normal bowel sounds heard. Central nervous system: Alert and oriented.  Extremities; moves extremities passively.  Skin: No rashes, lesions or ulcers     Data Reviewed: I have personally reviewed following labs and imaging studies  CBC:  Recent Labs Lab 04/28/16 0918 04/28/16 1736  WBC  --  10.4  HGB 11.2* 10.1*  HCT 33.0* 31.0*  MCV  --  99.0  PLT  --  [id number]   Basic Metabolic Panel:  Recent Labs Lab 04/28/16 0918 04/30/16 0557  NA 139 139  K 4.7 4.5  CL 77* 103  CO2  --  26  GLUCOSE 105* 162*  BUN 15 28*  CREATININE 1.00 0.87  CALCIUM  --  9.5   GFR: Estimated Creatinine Clearance: 48 mL/min (by C-G formula based on SCr of 0.87 mg/dL). Liver Function Tests: No results for input(s): AST, ALT, ALKPHOS, BILITOT, PROT, ALBUMIN in the last 168 hours. No results for input(s): LIPASE, AMYLASE in the last 168 hours. No results for input(s): AMMONIA in the last 168 hours. Coagulation Profile: No results for input(s): INR, PROTIME in the last 168 hours. Cardiac Enzymes: No results for input(s): CKTOTAL, CKMB, CKMBINDEX,  TROPONINI in the last 168 hours. BNP (last 3 results) No results for input(s): PROBNP in the last 8760 hours. HbA1C: No results for input(s): HGBA1C in the last 72 hours. CBG: No results for input(s): GLUCAP in the last 168 hours. Lipid Profile: No results for input(s): CHOL, HDL, LDLCALC, TRIG, CHOLHDL, LDLDIRECT in the last 72 hours. Thyroid Function Tests:  Recent Labs  04/28/16 1738  TSH 3.983   Anemia Panel: No results for input(s): VITAMINB12, FOLATE, FERRITIN, TIBC, IRON, RETICCTPCT in the last 72 hours. Sepsis Labs: No results for input(s): PROCALCITON, LATICACIDVEN in the last 168 hours.  No results found for this or any previous visit (from the past 240 hour(s)).       Radiology Studies: No results found.      Scheduled Meds: . amLODipine  5 mg Oral Daily  . benazepril  20 mg Oral Daily  . enoxaparin (LOVENOX) injection  40 mg Subcutaneous Q24H  . levothyroxine  100 mcg Oral QAC breakfast  . methylPREDNISolone (SOLU-MEDROL) injection  60 mg Intravenous Q12H  . nepafenac  1 drop Both Eyes BID  . senna-docusate  2 tablet Oral BID  . zolpidem  5 mg Oral QHS   Continuous Infusions:   LOS: 1 day    Time spent: 35 minutes.    Elmarie Shiley, MD [facility] Pager [phone]  If 7PM-7AM, please contact night-coverage [email or web address] Password [email or web address] 04/30/2016, 1:43 PM

## 2016-05-01 MED ORDER — POLYETHYLENE GLYCOL 3350 17 G PO PACK: 17.0000 g | PACK | Freq: Every day | ORAL | 0 refills | Status: DC

## 2016-05-01 MED ORDER — BISACODYL 10 MG RE SUPP
10.0000 mg | Freq: Once | RECTAL | Status: AC
  Administered 2016-05-01: 10 mg via RECTAL
  Filled 2016-05-01: qty 1

## 2016-05-01 MED ORDER — OXYCODONE HCL ER 10 MG PO T12A: 10.0000 mg | EXTENDED_RELEASE_TABLET | Freq: Two times a day (BID) | ORAL | 0 refills | Status: DC

## 2016-05-01 MED ORDER — PREDNISONE 20 MG PO TABS: ORAL_TABLET | ORAL | 0 refills | Status: DC

## 2016-05-01 MED ORDER — OXYCODONE HCL 5 MG PO TABS: 5.0000 mg | ORAL_TABLET | ORAL | 0 refills | Status: DC | PRN

## 2016-05-01 NOTE — Discharge Summary (Signed)
Physician Discharge Summary  Brittney Gates DOB: [birth year] DOA: 04/28/2016  PCP: [name], MD  Admit date: 04/28/2016 Discharge date: 05/01/2016  Admitted From: Home  Disposition:  Home   Recommendations for Outpatient Follow-up:  1. Follow up with PCP in 1-2 weeks 2. Please obtain BMP/CBC in one week   Home Health: yes.   Discharge Condition: Stable.  CODE STATUS: DNR Diet recommendation: Heart Healthy   Brief/Interim Summary: Brittney Gates Renneris a 83 y.o.femalewith medical history significant of HTN, chronic back pain, HLD, anxiety and arthritis that presents for severe lower back pain and loss of continence. Patient states that she has struggled with back pain since breaking her hip and rehabing through that (October 2013).  Patient admitted with worsening back pain, uncontrolled pain at home.   Assessment & Plan: 1-Lumbar radicular pain;  -MRI showing New stress fractures of the sacrum. New left foraminal and extraforaminal disc protrusion at L5-S1 with a mass effect upon the left L5 nerve. Moderately severe spinal stenosis at L1-2, unchanged. Compression of the thecal sac at L3-4 by fat at the site of the laminotomy defect -Appreciate Dr Annette Stable evaluation. Medical management.  -she has received 2 days of IV solumedrol. Plan to discharge patient on prednisone taper.  -palliative care consulted for pain management. Appreciate Wadie Lessen helps.  Patient to be discharge on OxyContin BID and PRN oxycodone. Follow up for home PA, has been arranged.   HTN - Continue with amlodipine and benazapril   Hypothyroidism -TSH normal.  -Continue with Synthroid.   Constipation - had small BM last night.  -Bowel regimen.    Discharge Diagnoses:  Principal Problem:   Lumbar radicular pain Active Problems:   Hypothyroidism   Hyperlipidemia   Essential hypertension   LOW BACK PAIN, CHRONIC   Radicular low back pain   Palliative care by specialist    Lumbosacral radiculopathy at L5   Sacral insufficiency fracture    Discharge Instructions  Discharge Instructions    Diet - low sodium heart healthy    Complete by:  As directed    Increase activity slowly    Complete by:  As directed      Allergies as of 05/01/2016      Reactions   Statins Other (See Comments)   Leg weakness   Contrast Media [iodinated Diagnostic Agents]    Rash . Received contrast at orthopedic office for steroid injection. Had rash after that    Dexamethasone Itching, Rash      Medication List    STOP taking these medications   naproxen 500 MG tablet Commonly known as:  NAPROSYN     TAKE these medications   amLODipine-benazepril 5-20 MG capsule Commonly known as:  LOTREL TAKE 1 CAPSULE BY MOUTH DAILY.   aspirin EC 81 MG tablet Take 81 mg by mouth daily.   calcium carbonate 600 MG Tabs tablet Commonly known as:  OS-CAL Take 600 mg by mouth 2 (two) times daily with a meal. Reported on 10/05/2015   Cholecalciferol 2000 units Tabs Take 2,000 Units by mouth daily.   docusate sodium 100 MG capsule Commonly known as:  COLACE Take 100 mg by mouth at bedtime as needed for mild constipation.   ILEVRO 0.3 % ophthalmic suspension Generic drug:  nepafenac Place 1 drop into both eyes 2 (two) times daily.   levothyroxine 100 MCG tablet Commonly known as:  SYNTHROID, LEVOTHROID TAKE ONE TABLET BY MOUTH DAILY BEFORE BREAKFAST What changed:  See the new instructions.  multivitamin with minerals Tabs tablet Take 1 tablet by mouth daily.   oxyCODONE 5 MG immediate release tablet Commonly known as:  Oxy IR/ROXICODONE Take 1 tablet (5 mg total) by mouth every 4 (four) hours as needed. What changed:  reasons to take this   oxyCODONE 10 mg 12 hr tablet Commonly known as:  OXYCONTIN Take 1 tablet (10 mg total) by mouth every 12 (twelve) hours. What changed:  You were already taking a medication with the same name, and this prescription was added. Make  sure you understand how and when to take each.   polyethylene glycol packet Commonly known as:  MIRALAX / GLYCOLAX Take 17 g by mouth daily.   predniSONE 20 MG tablet Commonly known as:  DELTASONE Take 2 tablets by mouth for 4 days   sennosides-docusate sodium 8.6-50 MG tablet Commonly known as:  SENOKOT-S Take 2 tablets by mouth at bedtime as needed for constipation.   zolpidem 10 MG tablet Commonly known as:  AMBIEN Take 1 tablet (10 mg total) by mouth at bedtime as needed for sleep. What changed:  how much to take  when to take this  additional instructions            Durable Medical Equipment        Start     Ordered   04/30/16 1056  For home use only DME Gel overlay  Once     04/30/16 1055   04/29/16 1507  For home use only DME Hospital bed  Once    Question Answer Comment  The above medical condition requires: Patient requires the ability to reposition frequently   Head must be elevated greater than: 45 degrees   Bed type Semi-electric      04/29/16 1506     Follow-up Information    [facility] Follow up.   Specialty:  Home Health Services Why:  physical therapy Contact information: [address] [place] [address] [phone]        [facility] Follow up.   Why:  hospital bed Contact information: [place] [address] [phone]          Allergies  Allergen Reactions  . Statins Other (See Comments)    Leg weakness  . Contrast Media [Iodinated Diagnostic Agents]     Rash . Received contrast at orthopedic office for steroid injection. Had rash after that   . Dexamethasone Itching and Rash    Consultations:  Palliative for pain management.    Procedures/Studies: Mr Lumbar Spine W Wo Contrast  Result Date: 04/28/2016 CLINICAL DATA:  Recurrent severe low back pain. New onset of urinary incontinence. EXAM: MRI LUMBAR SPINE WITHOUT AND WITH CONTRAST TECHNIQUE: Multiplanar  and multiecho pulse sequences of the lumbar spine were obtained without and with intravenous contrast. CONTRAST:  17mL MULTIHANCE GADOBENATE DIMEGLUMINE 529 MG/ML IV SOLN COMPARISON:  MRI dated 01/29/2016 and CT scan dated 10/15/2015 FINDINGS: Segmentation:  Standard. Alignment:  Physiologic. Vertebrae: Old compression fracture of L1 treated with vertebroplasty. Vertical stress fractures of both sacral ala as well as a transverse stress fracture through inferior aspect of the S1 segment of the sacrum. Conus medullaris: Extends to the  T12-L1 level and appears normal. Paraspinal and other soft tissues: Diverticulosis of the distal colon. Disc levels: T12-L1: No protrusion or bulging of the disc. Slight chronic protrusion of the posterosuperior aspect of the L1 vertebral body into the spinal canal without neural impingement. Old healed  L1 fracture. L1-2: Schmorl's nodes in the endplates at [id number]. No disc bulging or protrusion. Hypertrophy of the ligamentum flavum and facet joints combine to create moderately severe spinal stenosis without focal neural impingement. L2-3: Small broad-based disc bulge most prominent into the left neural foramen. Short pedicles and slight hypertrophy of the ligamentum flavum and facet joints combine to create slight spinal stenosis without focal neural impingement L3-4: Marked disc space narrowing with broad-based endplate osteophytes and disc bulging. The thecal sac is compressed by fat and laminectomy defect. Right extraforaminal disc protrusion appears to have a slight mass effect upon the exiting right L3 nerve. Left foraminal protrusion also of appears to have a slight mass effect upon the left L3 nerve. L4-5: Posterior fusion with excellent posterior decompression. No pathologic enhancement after contrast administration. L5-S1: Progressive severe degenerative disc disease with new fluid in the disc space with a chronic broad-based soft disc protrusion with new protrusion into the  left neural foramen which appears to have a mass effect upon the left L5 nerve. IMPRESSION: 1. New stress fractures of the sacrum. 2. New left foraminal and extraforaminal disc protrusion at L5-S1 with a mass effect upon the left L5 nerve. 3. Moderately severe spinal stenosis at L1-2, unchanged. 4. Compression of the thecal sac at L3-4 by fat at the site of the laminotomy defect. Electronically Signed   By: Lorriane Shire M.D.   On: 04/28/2016 12:37   Dg Hip Unilat W Or Wo Pelvis 2-3 Views Left  Result Date: 04/28/2016 CLINICAL DATA:  Worsening low back and left hip pain over the past 2 weeks with no new trauma. Patient has undergone recent back surgery and is undergone previous ORIF for an intertrochanteric fracture of the left hip. EXAM: DG HIP (WITH OR WITHOUT PELVIS) 2-3V LEFT COMPARISON:  AP view of the lumbar spine of January 10, 2016 which included portion of the left hip. FINDINGS: The bones are subjectively osteopenic. The patient has undergone previous posterior fusion at L4-5. The bony pelvis exhibits no lytic or blastic lesion. The patient has undergone ORIF of an intertrochanteric fracture of the left hip the metallic rods and telescoping nail reveal no evidence of loosening. The left hip joint space is reasonably well-maintained. There is no acute fracture. IMPRESSION: There is no acute or significant chronic bony abnormality of the left hip joint. There are chronic changes from previous ORIF for an intertrochanteric fracture. No acute pelvic abnormality is observed. Electronically Signed   By: David  Martinique M.D.   On: 04/28/2016 09:40       Subjective: Pain better controlled on OxyContin.   Discharge Exam: Vitals:   04/30/16 1958 05/01/16 0500  BP: (!) 152/50 (!) 164/65  Pulse: 65 66  Resp: 19 18  Temp: 98.1 F (36.7 C) 97.8 F (36.6 C)   Vitals:   04/30/16 0610 04/30/16 1531 04/30/16 1958 05/01/16 0500  BP: (!) 151/69 (!) 138/58 (!) 152/50 (!) 164/65  Pulse: 73 67 65 66   Resp: 18 18 19 18   Temp: 98 F (36.7 C) 98.1 F (36.7 C) 98.1 F (36.7 C) 97.8 F (36.6 C)  TempSrc: Oral Oral Oral Oral  SpO2: 95% 94% 99% 94%  Weight:      Height:        General: Pt is alert, awake, not in acute distress Cardiovascular: RRR, S1/S2 +, no rubs, no gallops Respiratory: CTA bilaterally, no wheezing, no rhonchi Abdominal: Soft, NT, ND, bowel sounds + Extremities: no edema, no cyanosis  The results of significant diagnostics from this hospitalization (including imaging, microbiology, ancillary and laboratory) are listed below for reference.     Microbiology: No results found for this or any previous visit (from the past 240 hour(s)).   Labs: BNP (last 3 results) No results for input(s): BNP in the last 8760 hours. Basic Metabolic Panel:  Recent Labs Lab 04/28/16 0918 04/30/16 0557  NA 139 139  K 4.7 4.5  CL 77* 103  CO2  --  26  GLUCOSE 105* 162*  BUN 15 28*  CREATININE 1.00 0.87  CALCIUM  --  9.5   Liver Function Tests: No results for input(s): AST, ALT, ALKPHOS, BILITOT, PROT, ALBUMIN in the last 168 hours. No results for input(s): LIPASE, AMYLASE in the last 168 hours. No results for input(s): AMMONIA in the last 168 hours. CBC:  Recent Labs Lab 04/28/16 0918 04/28/16 1736  WBC  --  10.4  HGB 11.2* 10.1*  HCT 33.0* 31.0*  MCV  --  99.0  PLT  --  259   Cardiac Enzymes: No results for input(s): CKTOTAL, CKMB, CKMBINDEX, TROPONINI in the last 168 hours. BNP: Invalid input(s): POCBNP CBG: No results for input(s): GLUCAP in the last 168 hours. D-Dimer No results for input(s): DDIMER in the last 72 hours. Hgb A1c No results for input(s): HGBA1C in the last 72 hours. Lipid Profile No results for input(s): CHOL, HDL, LDLCALC, TRIG, CHOLHDL, LDLDIRECT in the last 72 hours. Thyroid function studies  Recent Labs  04/28/16 1738  TSH 3.983   Anemia work up No results for input(s): VITAMINB12, FOLATE, FERRITIN, TIBC, IRON,  RETICCTPCT in the last 72 hours. Urinalysis    Component Value Date/Time   COLORURINE YELLOW 10/30/2014 [place] 10/30/2014 1208   LABSPEC <=1.005 (A) 10/30/2014 1208   PHURINE 6.0 10/30/2014 1208   GLUCOSEU NEGATIVE 10/30/2014 1208   HGBUR TRACE-LYSED (A) 10/30/2014 1208   BILIRUBINUR NEGATIVE 10/30/2014 1208   KETONESUR NEGATIVE 10/30/2014 1208   PROTEINUR NEGATIVE 05/16/2013 1124   UROBILINOGEN 0.2 10/30/2014 1208   NITRITE NEGATIVE 10/30/2014 1208   LEUKOCYTESUR NEGATIVE 10/30/2014 1208   Sepsis Labs Invalid input(s): PROCALCITONIN,  WBC,  LACTICIDVEN Microbiology No results found for this or any previous visit (from the past 240 hour(s)).   Time coordinating discharge: Over 30 minutes  SIGNED:   Elmarie Shiley, MD  [facility] 05/01/2016, 11:58 AM Pager   If 7PM-7AM, please contact night-coverage [email or web address] Password [email or web address]

## 2016-05-01 NOTE — Progress Notes (Signed)
Daily Progress Note   Patient Name: Brittney Gates       Date: 05/01/2016 DOB: January 25, 1925  Age: 83 y.o. MRN#: [id number] Attending Physician: Elmarie Shiley, MD Primary Care Physician: [name], MD Admit Date: 04/28/2016  Reason for Consultation/Follow-up: Pain control and Psychosocial/spiritual support 2/2 chronic low back  Subjective:  -continued conversation regarding current chronic low back pain 2/2 to extensive surgical and mechanical back pain past medical  history   -she is currently on oral agents, Oxycodone ER 10 mg and Oxycodone IR 5 mg every 4 hrs prn.   She was comfortable thru the night but this morning has used a prn dose  -reinforced education from yesterday regarding incorporating non pharmaceutical interventions; PT, meditation, prayer and distraction  -I spoke to the son by phone and reviewed the discharge plan    -Primary health care and pain management will be managed by Back to Vivi Ferns NP- House calls program    -Referral made to [facility] for palliative home services    -[facility] for home health needs   Length of Stay: 2  Current Medications: Scheduled Meds:  . amLODipine  5 mg Oral Daily  . benazepril  20 mg Oral Daily  . enoxaparin (LOVENOX) injection  40 mg Subcutaneous Q24H  . levothyroxine  100 mcg Oral QAC breakfast  . methylPREDNISolone (SOLU-MEDROL) injection  60 mg Intravenous Q12H  . nepafenac  1 drop Both Eyes BID  . oxyCODONE  10 mg Oral Q12H  . polyethylene glycol  17 g Oral Daily  . senna-docusate  2 tablet Oral BID  . zolpidem  5 mg Oral QHS    Continuous Infusions:   PRN Meds: acetaminophen, ondansetron (ZOFRAN) IV, oxyCODONE, senna-docusate  Physical Exam  Constitutional: She is oriented to person, place, and time.  She appears well-developed.  HENT:  Mouth/Throat: Oropharynx is clear and moist.  Cardiovascular: Normal rate, regular rhythm and normal heart sounds.   Pulmonary/Chest: Effort normal and breath sounds normal.  Musculoskeletal:  - continued c/o chronic low back pain that radiates down both legs to ankles  Neurological: She is alert and oriented to person, place, and time.            Vital Signs: BP (!) 164/65 (BP Location: Right Arm)   Pulse 66   Temp 97.8 F (  36.6 C) (Oral)   Resp 18   Ht 5\' 8"  (1.727 m)   Wt 77.7 kg (171 lb 3.2 oz)   SpO2 94%   BMI 26.03 kg/m  SpO2: SpO2: 94 % O2 Device: O2 Device: Not Delivered O2 Flow Rate:    Intake/output summary:  Intake/Output Summary (Last 24 hours) at 05/01/16 [id number] Last data filed at 04/30/16 [id number]  Gross per 24 hour  Intake              240 ml  Output                0 ml  Net              240 ml   LBM: Last BM Date: 04/29/16 Baseline Weight: Weight: 77.7 kg (171 lb 3.2 oz) Most recent weight: Weight: 77.7 kg (171 lb 3.2 oz)       Palliative Assessment/Data: 40 % at best    Flowsheet Rows   Flowsheet Row Most Recent Value  Intake Tab  Referral Department  Hospitalist  Unit at Time of Referral  Med/Surg Unit  Palliative Care Primary Diagnosis  Pain  Date Notified  04/29/16  Palliative Care Type  New Palliative care  Reason for referral  Pain  Date of Admission  04/28/16  Date first seen by Palliative Care  04/30/16  # of days Palliative referral response time  1 Day(s)  # of days IP prior to Palliative referral  1  Clinical Assessment  Palliative Performance Scale Score  40%  Psychosocial & Spiritual Assessment  Palliative Care Outcomes      Patient Active Problem List   Diagnosis Date Noted  . Palliative care by specialist 04/30/2016  . Lumbosacral radiculopathy at L5   . Sacral insufficiency fracture   . Lumbar radicular pain 04/28/2016  . Radicular low back pain 04/28/2016  . Routine general medical  examination at a health care facility 11/01/2015  . RUQ pain 10/05/2015  . Abnormal CT scan, chest 05/06/2012  . Osteoporosis 04/30/2012  . Hip fracture left 01/20/2012  . PVD (peripheral vascular disease) ([place])   . ANEMIA 04/24/2010  . Hypothyroidism 08/14/2007  . Hyperlipidemia 08/14/2007  . ANXIETY 08/14/2007  . Essential hypertension 08/14/2007  . ARTHRITIS 08/14/2007  . LOW BACK PAIN, CHRONIC 08/14/2007    Palliative Care Assessment & Plan    Assessment: Long time chronic low back pain.  Increased dependence for all ADLS.  Poor pain control with limited surgical or neurological interventions being recommended    Goals of Care and Additional Recommendations:  Patient's main goal is that her pain be controlled to an acceptable level enabling her to maintain self care and an acceptable quality of life.     Code Status:    Code Status Orders        Start     Ordered   04/28/16 1710  Do not attempt resuscitation (DNR)  Continuous    Question Answer Comment  In the event of cardiac or respiratory ARREST Do not call a "code blue"   In the event of cardiac or respiratory ARREST Do not perform Intubation, CPR, defibrillation or ACLS   In the event of cardiac or respiratory ARREST Use medication by any route, position, wound care, and other measures to relive pain and suffering. May use oxygen, suction and manual treatment of airway obstruction as needed for comfort.      04/28/16 1709    Code Status History    Date Active  Date Inactive Code Status Order ID Comments User Context   05/15/2013  7:55 AM 05/17/2013  4:50 PM Full Code [id number]  Theressa Millard, MD Inpatient   01/21/2012  8:39 PM 01/23/2012  4:36 PM Full Code [id number]  Peggye Pitt, RN Inpatient   01/20/2012 11:52 PM 01/21/2012  8:39 PM Full Code [id number]  Mellissa Kohut, RN ED         Discharge Planning:  Home with Home Health--[facility] is agency requested  Care plan was discussed with Dr Tyrell Antonio,  John/son, Alvester Chou NP  Thank you for allowing the Palliative Medicine Team to assist in the care of this patient.   Time In: 0740 Time Out:  0815 Total Time 35 min Prolonged Time Billed  no       Greater than 50%  of this time was spent counseling and coordinating care related to the above assessment and plan.  Wadie Lessen, NP  Please contact Palliative Medicine Team phone at [phone] for questions and concerns.

## 2016-05-01 NOTE — Progress Notes (Signed)
Occupational Therapy Treatment Patient Details Name: JESSIKAH MARINKO MRN: [id number] DOB: July 25, 1924 Today's Date: 05/01/2016    History of present illness 83 yo female admitted with lumbar radicular pain, sacral stress fractures, L5-S1 disc protrusion. Hx of L hip ORIF, VP L1, spinal stenosis   OT comments  Pt much improved!  Follow Up Recommendations  Home health OT;Supervision/Assistance - 24 hour    Equipment Recommendations  3 in 1 bedside commode    Recommendations for Other Services      Precautions / Restrictions Precautions Precautions: Fall       Mobility Bed Mobility               General bed mobility comments: pt in chair  Transfers Overall transfer level: Needs assistance Equipment used: Rolling walker (2 wheeled) Transfers: Sit to/from [facility] Sit to Stand: Min assist Stand pivot transfers: Min assist       General transfer comment: chair, walk to bathroom and back to chair        ADL Overall ADL's : Needs assistance/impaired     Grooming: Cueing for safety;Standing;Minimal assistance           Upper Body Dressing : Minimal assistance;Sitting   Lower Body Dressing: Moderate assistance;Sit to/from stand;Cueing for safety;Cueing for sequencing   Toilet Transfer: Minimal assistance;RW;Ambulation;Comfort height toilet   Toileting- Clothing Manipulation and Hygiene: Minimal assistance;Sit to/from stand;Cueing for safety;Cueing for sequencing       Functional mobility during ADLs: Minimal assistance;Rolling walker;Caregiver able to provide necessary level of assistance;Cueing for safety                  Cognition   Behavior During Therapy: [facility] for tasks assessed/performed Overall Cognitive Status: Within Functional Limits for tasks assessed                               General Comments      Pertinent Vitals/ Pain       Pain Score: 4  Pain Location: L hip area Pain Descriptors / Indicators:  Sore Pain Intervention(s): Monitored during session         Frequency  Min 2X/week        Progress Toward Goals  OT Goals(current goals can now be found in the care plan section)  Progress towards OT goals: Progressing toward goals     Plan Discharge plan remains appropriate       End of Session Equipment Utilized During Treatment: Rolling walker   Activity Tolerance Patient limited by pain   Patient Left with call bell/phone within reach;in bed   Nurse Communication Mobility status        Time: [id number] OT Time Calculation (min): 24 min  Charges: OT General Charges $OT Visit: 1 Procedure OT Treatments $Self Care/Home Management : 23-37 mins  [place]land Daisey, Edwena Felty D 05/01/2016, 2:02 PM

## 2016-05-05 ENCOUNTER — Telehealth (INDEPENDENT_AMBULATORY_CARE_PROVIDER_SITE_OTHER): Payer: Self-pay | Admitting: *Deleted

## 2016-05-05 ENCOUNTER — Encounter (INDEPENDENT_AMBULATORY_CARE_PROVIDER_SITE_OTHER): Payer: Self-pay | Admitting: Orthopedic Surgery

## 2016-05-05 ENCOUNTER — Ambulatory Visit (INDEPENDENT_AMBULATORY_CARE_PROVIDER_SITE_OTHER): Payer: BLUE CROSS/BLUE SHIELD | Admitting: Orthopedic Surgery

## 2016-05-05 ENCOUNTER — Ambulatory Visit (INDEPENDENT_AMBULATORY_CARE_PROVIDER_SITE_OTHER): Payer: Medicare Other | Admitting: Orthopedic Surgery

## 2016-05-05 VITALS — Ht 68.0 in | Wt 171.0 lb

## 2016-05-05 DIAGNOSIS — M47816 Spondylosis without myelopathy or radiculopathy, lumbar region: Secondary | ICD-10-CM | POA: Diagnosis not present

## 2016-05-05 DIAGNOSIS — M8448XG Pathological fracture, other site, subsequent encounter for fracture with delayed healing: Secondary | ICD-10-CM | POA: Diagnosis not present

## 2016-05-05 MED ORDER — OXYCODONE HCL ER 10 MG PO T12A: 10.0000 mg | EXTENDED_RELEASE_TABLET | Freq: Two times a day (BID) | ORAL | 0 refills | Status: DC

## 2016-05-05 MED ORDER — OXYCODONE HCL 5 MG PO TABS: 5.0000 mg | ORAL_TABLET | Freq: Four times a day (QID) | ORAL | 0 refills | Status: DC | PRN

## 2016-05-05 MED ORDER — PREDNISONE 20 MG PO TABS: ORAL_TABLET | ORAL | 1 refills | Status: DC

## 2016-05-05 NOTE — Telephone Encounter (Signed)
Pt was just seen and son [name] is asking if physical therapy would be helpful for pt. Asking for a call back. Ok to leave message.

## 2016-05-05 NOTE — Telephone Encounter (Signed)
Call patient's son and let him know that physical therapy may be too much for her to do right now. We'll have her focus on the walking with her walker. When she is able to safely increase her endurance with walking we could set her up with physical therapy. Do not feel she has any endurance right now to participate with therapy and that she may be more painful with therapy.

## 2016-05-05 NOTE — Progress Notes (Signed)
Office Visit Note   Patient: Brittney Gates           Date of Birth: 1925-01-02           MRN: [id number] Visit Date: 05/05/2016              Requested by: [name], MD [place], [place] [id number] PCP: [name], MD  Chief Complaint  Patient presents with  . Lower Back - Pain    HPI: Patient presents for follow up of lower back pain. This has been a chronic issue for the patient. She was recently admitted to the hospital on 04/28/16. There they found new stress fractures of the sacrum. New left foraminal and extraforaminal disc protrusion at L5-S1 with a mass effect upon the left L5 nerve. Compression of the thecal sac at L3-4 by fat at the site of the laminotomy defect. Dr. Trenton Gammon from neurosurgery consulted on this patient. He recommended IV Solu-Medrol as an anti-inflammatory agent along with gradual mobilization with physical therapy in conjunction with narcotic analgesics. Patient is currently taking oxycontin 10mg  1 po q 12 hrs and oxycodone 5mg  1 po q 4 hours. This has provided her minimal relief. Patient is non weight bearing with wheelchair. Her son states pain is unmanageable. She is in severe pain in the seated position. She does not currently have appointment to follow up with neurosurgery. She is requesting to see Dr. Ellene Route. Maxcine Ham, RT  Patient was seen by Dr. Trenton Gammon in the hospital but no follow-up was scheduled with neurosurgery.  Assessment & Plan: Visit Diagnoses:  1. Sacral insufficiency fracture with delayed healing   2. Spondylosis of lumbar spine     Plan: Recommended patient to try to wean off the narcotics I will refill a prescription for the narcotics and the prednisone. Discussed with the patient and her son she is at high risk of stopping breathing with the level of narcotics she is taking and that she could die from this. Discussed that there is no way to know how much narcotic is too much for her but discussed that  she has increased risk of dying from narcotic overdose. Recommended walking daily minimize sitting follow-up in 3 weeks.  Follow-Up Instructions: Return in about 3 weeks (around 05/26/2016).   Ortho Exam On examination patient is alert oriented she is in moderate distress she is very anxious very tearful. She states she has sacral pain with sleeping and she has left lower extremity radicular pain she denies any weakness denies any bowel or bladder changes. She has motor strength with his symmetric in both lower extremities she has a negative straight leg raise bilaterally. MRI scan shows bulging disks as well as a sacral insufficiency fracture.  Imaging: No results found.  Orders:  No orders of the defined types were placed in this encounter.  No orders of the defined types were placed in this encounter.    Procedures: No procedures performed  Clinical Data: No additional findings.  Subjective: Review of Systems  Objective: Vital Signs: Ht 5\' 8"  (1.727 m)   Wt 171 lb (77.6 kg)   BMI 26.00 kg/m   Specialty Comments:  No specialty comments available.  PMFS History: Patient Active Problem List   Diagnosis Date Noted  . Spondylosis of lumbar spine 05/05/2016  . Palliative care by specialist 04/30/2016  . Lumbosacral radiculopathy at L5   . Sacral insufficiency fracture with delayed healing   . Lumbar radicular pain 04/28/2016  .  Radicular low back pain 04/28/2016  . Routine general medical examination at a health care facility 11/01/2015  . RUQ pain 10/05/2015  . Abnormal CT scan, chest 05/06/2012  . Osteoporosis 04/30/2012  . Hip fracture left 01/20/2012  . PVD (peripheral vascular disease) ([place])   . ANEMIA 04/24/2010  . Hypothyroidism 08/14/2007  . Hyperlipidemia 08/14/2007  . ANXIETY 08/14/2007  . Essential hypertension 08/14/2007  . ARTHRITIS 08/14/2007  . LOW BACK PAIN, CHRONIC 08/14/2007   Past Medical History:  Diagnosis Date  . Acute pancreatitis   .  ANEMIA   . ANXIETY   . Arthritis   . Chronic anemia   . Diverticulosis   . Gastritis   . Hemorrhoids   . Hiatal hernia   . HYPERLIPIDEMIA   . HYPERTENSION   . HYPOTHYROIDISM   . LOW BACK PAIN, CHRONIC   . PAD (peripheral artery disease) ([facility])     Family History  Problem Relation Age of Onset  . Lung cancer Father   . Gallbladder disease Son   . Gallbladder disease Daughter   . Colon cancer Neg Hx     Past Surgical History:  Procedure Laterality Date  . ANGIOPLASTY  1995  . APPENDECTOMY    . BACK SURGERY     x's 2  . BREAST SURGERY     Benign  . FEMUR IM NAIL  01/21/2012   Procedure: INTRAMEDULLARY (IM) NAIL FEMORAL;  Surgeon: Newt Minion, MD;  Location: [facility];  Service: Orthopedics;  Laterality: Left;  Intertrochanteric Nail Left Hip  . HEMORRHOID SURGERY    . TUBAL LIGATION     Social History   Occupational History  . Not on file.   Social History Main Topics  . Smoking status: Never Smoker  . Smokeless tobacco: Never Used  . Alcohol use No  . Drug use: No  . Sexual activity: Not on file

## 2016-05-06 DIAGNOSIS — I10 Essential (primary) hypertension: Secondary | ICD-10-CM | POA: Diagnosis not present

## 2016-05-06 DIAGNOSIS — G4700 Insomnia, unspecified: Secondary | ICD-10-CM | POA: Diagnosis not present

## 2016-05-06 DIAGNOSIS — E034 Atrophy of thyroid (acquired): Secondary | ICD-10-CM | POA: Diagnosis not present

## 2016-05-06 DIAGNOSIS — K5901 Slow transit constipation: Secondary | ICD-10-CM | POA: Diagnosis not present

## 2016-05-06 DIAGNOSIS — G894 Chronic pain syndrome: Secondary | ICD-10-CM | POA: Diagnosis not present

## 2016-05-06 NOTE — Telephone Encounter (Signed)
I called and spoke with Brittney Gates patients son to advise of message below.

## 2016-05-08 DIAGNOSIS — Z7951 Long term (current) use of inhaled steroids: Secondary | ICD-10-CM | POA: Diagnosis not present

## 2016-05-08 DIAGNOSIS — Z7982 Long term (current) use of aspirin: Secondary | ICD-10-CM | POA: Diagnosis not present

## 2016-05-08 DIAGNOSIS — R2689 Other abnormalities of gait and mobility: Secondary | ICD-10-CM | POA: Diagnosis not present

## 2016-05-08 DIAGNOSIS — Z9181 History of falling: Secondary | ICD-10-CM | POA: Diagnosis not present

## 2016-05-08 DIAGNOSIS — M549 Dorsalgia, unspecified: Secondary | ICD-10-CM | POA: Diagnosis not present

## 2016-05-08 DIAGNOSIS — I10 Essential (primary) hypertension: Secondary | ICD-10-CM | POA: Diagnosis not present

## 2016-05-08 DIAGNOSIS — E039 Hypothyroidism, unspecified: Secondary | ICD-10-CM | POA: Diagnosis not present

## 2016-05-12 ENCOUNTER — Telehealth (INDEPENDENT_AMBULATORY_CARE_PROVIDER_SITE_OTHER): Payer: Self-pay | Admitting: Orthopedic Surgery

## 2016-05-12 DIAGNOSIS — E039 Hypothyroidism, unspecified: Secondary | ICD-10-CM | POA: Diagnosis not present

## 2016-05-12 DIAGNOSIS — Z7982 Long term (current) use of aspirin: Secondary | ICD-10-CM | POA: Diagnosis not present

## 2016-05-12 DIAGNOSIS — R2689 Other abnormalities of gait and mobility: Secondary | ICD-10-CM | POA: Diagnosis not present

## 2016-05-12 DIAGNOSIS — M549 Dorsalgia, unspecified: Secondary | ICD-10-CM | POA: Diagnosis not present

## 2016-05-12 DIAGNOSIS — Z9181 History of falling: Secondary | ICD-10-CM | POA: Diagnosis not present

## 2016-05-12 DIAGNOSIS — I10 Essential (primary) hypertension: Secondary | ICD-10-CM | POA: Diagnosis not present

## 2016-05-12 NOTE — Telephone Encounter (Signed)
I called and lm on vm to advise pt that this is all patient driven and that if she does not feel that she is able to walk that schedule then do what she feels she is able to do safely and without causing additional pain. To call back with questions

## 2016-05-12 NOTE — Telephone Encounter (Signed)
Patient's son [name]) called asked if it is ok to walk his mother around the family room. He said he want to make sure it is helping her back and not hurting her. He said they walk around about 4 to 5 times  every hour and a half. The number to contact him is [phone]

## 2016-05-13 ENCOUNTER — Other Ambulatory Visit: Payer: Self-pay | Admitting: Internal Medicine

## 2016-05-13 NOTE — Telephone Encounter (Signed)
Should still have 1 refill and does not need to be refilled until March.

## 2016-05-15 DIAGNOSIS — M8448XA Pathological fracture, other site, initial encounter for fracture: Secondary | ICD-10-CM | POA: Diagnosis not present

## 2016-05-16 ENCOUNTER — Other Ambulatory Visit: Payer: Self-pay | Admitting: Neurosurgery

## 2016-05-16 DIAGNOSIS — M8448XA Pathological fracture, other site, initial encounter for fracture: Secondary | ICD-10-CM

## 2016-05-20 ENCOUNTER — Other Ambulatory Visit (HOSPITAL_COMMUNITY): Payer: Self-pay | Admitting: Interventional Radiology

## 2016-05-20 DIAGNOSIS — Z9181 History of falling: Secondary | ICD-10-CM | POA: Diagnosis not present

## 2016-05-20 DIAGNOSIS — E039 Hypothyroidism, unspecified: Secondary | ICD-10-CM | POA: Diagnosis not present

## 2016-05-20 DIAGNOSIS — I10 Essential (primary) hypertension: Secondary | ICD-10-CM | POA: Diagnosis not present

## 2016-05-20 DIAGNOSIS — R2689 Other abnormalities of gait and mobility: Secondary | ICD-10-CM | POA: Diagnosis not present

## 2016-05-20 DIAGNOSIS — M549 Dorsalgia, unspecified: Secondary | ICD-10-CM | POA: Diagnosis not present

## 2016-05-20 DIAGNOSIS — Z7982 Long term (current) use of aspirin: Secondary | ICD-10-CM | POA: Diagnosis not present

## 2016-05-20 DIAGNOSIS — S3210XA Unspecified fracture of sacrum, initial encounter for closed fracture: Secondary | ICD-10-CM

## 2016-05-26 ENCOUNTER — Ambulatory Visit (INDEPENDENT_AMBULATORY_CARE_PROVIDER_SITE_OTHER): Payer: BLUE CROSS/BLUE SHIELD | Admitting: Orthopedic Surgery

## 2016-05-27 DIAGNOSIS — I10 Essential (primary) hypertension: Secondary | ICD-10-CM | POA: Diagnosis not present

## 2016-05-27 DIAGNOSIS — R2689 Other abnormalities of gait and mobility: Secondary | ICD-10-CM | POA: Diagnosis not present

## 2016-05-27 DIAGNOSIS — Z9181 History of falling: Secondary | ICD-10-CM | POA: Diagnosis not present

## 2016-05-27 DIAGNOSIS — Z7982 Long term (current) use of aspirin: Secondary | ICD-10-CM | POA: Diagnosis not present

## 2016-05-27 DIAGNOSIS — M549 Dorsalgia, unspecified: Secondary | ICD-10-CM | POA: Diagnosis not present

## 2016-05-27 DIAGNOSIS — E039 Hypothyroidism, unspecified: Secondary | ICD-10-CM | POA: Diagnosis not present

## 2016-05-28 ENCOUNTER — Other Ambulatory Visit: Payer: Self-pay | Admitting: Radiology

## 2016-05-28 ENCOUNTER — Other Ambulatory Visit: Payer: Self-pay | Admitting: General Surgery

## 2016-05-29 ENCOUNTER — Encounter (HOSPITAL_COMMUNITY): Payer: Self-pay

## 2016-05-29 ENCOUNTER — Other Ambulatory Visit (HOSPITAL_COMMUNITY): Payer: Self-pay | Admitting: Interventional Radiology

## 2016-05-29 ENCOUNTER — Ambulatory Visit (HOSPITAL_COMMUNITY)
Admission: RE | Admit: 2016-05-29 | Discharge: 2016-05-29 | Disposition: A | Discharge status: Home / Self Care | Payer: Medicare Other | Source: Ambulatory Visit | Attending: Interventional Radiology | Admitting: Interventional Radiology

## 2016-05-29 DIAGNOSIS — M4858XA Collapsed vertebra, not elsewhere classified, sacral and sacrococcygeal region, initial encounter for fracture: Secondary | ICD-10-CM | POA: Diagnosis not present

## 2016-05-29 DIAGNOSIS — E785 Hyperlipidemia, unspecified: Secondary | ICD-10-CM | POA: Diagnosis not present

## 2016-05-29 DIAGNOSIS — M48061 Spinal stenosis, lumbar region without neurogenic claudication: Secondary | ICD-10-CM | POA: Insufficient documentation

## 2016-05-29 DIAGNOSIS — E039 Hypothyroidism, unspecified: Secondary | ICD-10-CM | POA: Diagnosis not present

## 2016-05-29 DIAGNOSIS — M8448XA Pathological fracture, other site, initial encounter for fracture: Secondary | ICD-10-CM | POA: Insufficient documentation

## 2016-05-29 DIAGNOSIS — Z7952 Long term (current) use of systemic steroids: Secondary | ICD-10-CM | POA: Diagnosis not present

## 2016-05-29 DIAGNOSIS — M1990 Unspecified osteoarthritis, unspecified site: Secondary | ICD-10-CM | POA: Insufficient documentation

## 2016-05-29 DIAGNOSIS — Z7982 Long term (current) use of aspirin: Secondary | ICD-10-CM | POA: Insufficient documentation

## 2016-05-29 DIAGNOSIS — G8929 Other chronic pain: Secondary | ICD-10-CM | POA: Diagnosis not present

## 2016-05-29 DIAGNOSIS — I10 Essential (primary) hypertension: Secondary | ICD-10-CM | POA: Diagnosis not present

## 2016-05-29 DIAGNOSIS — F419 Anxiety disorder, unspecified: Secondary | ICD-10-CM | POA: Insufficient documentation

## 2016-05-29 DIAGNOSIS — S3210XA Unspecified fracture of sacrum, initial encounter for closed fracture: Secondary | ICD-10-CM

## 2016-05-29 DIAGNOSIS — K449 Diaphragmatic hernia without obstruction or gangrene: Secondary | ICD-10-CM | POA: Insufficient documentation

## 2016-05-29 DIAGNOSIS — M533 Sacrococcygeal disorders, not elsewhere classified: Secondary | ICD-10-CM | POA: Diagnosis not present

## 2016-05-29 DIAGNOSIS — M5127 Other intervertebral disc displacement, lumbosacral region: Secondary | ICD-10-CM | POA: Diagnosis not present

## 2016-05-29 DIAGNOSIS — D649 Anemia, unspecified: Secondary | ICD-10-CM | POA: Insufficient documentation

## 2016-05-29 HISTORY — PX: IR GENERIC HISTORICAL: IMG1180011

## 2016-05-29 LAB — BASIC METABOLIC PANEL
Anion gap: 11 (ref 5–15)
BUN: 13 mg/dL (ref 6–20)
CO2: 26 mmol/L (ref 22–32)
Calcium: 8.8 mg/dL — ABNORMAL LOW (ref 8.9–10.3)
Chloride: 96 mmol/L — ABNORMAL LOW (ref 101–111)
Creatinine, Ser: 0.94 mg/dL (ref 0.44–1.00)
GFR calc Af Amer: >60 mL/min (ref >60)
GFR calc non Af Amer: 52 mL/min — ABNORMAL LOW (ref >60)
Glucose, Bld: 156 mg/dL — ABNORMAL HIGH (ref 65–99)
Potassium: 4.3 mmol/L (ref 3.5–5.1)
Sodium: 133 mmol/L — ABNORMAL LOW (ref 135–145)

## 2016-05-29 LAB — PROTIME-INR
INR: 0.98
Prothrombin Time: 13 seconds (ref 11.4–15.2)

## 2016-05-29 LAB — CBC
HCT: 33.9 % — ABNORMAL LOW (ref 36.0–46.0)
Hemoglobin: 11.1 g/dL — ABNORMAL LOW (ref 12.0–15.0)
MCH: 32.6 pg (ref 26.0–34.0)
MCHC: 32.7 g/dL (ref 30.0–36.0)
MCV: 99.4 fL (ref 78.0–100.0)
Platelets: 290 10*3/uL (ref 150–400)
RBC: 3.41 MIL/uL — ABNORMAL LOW (ref 3.87–5.11)
RDW: 19.2 % — ABNORMAL HIGH (ref 11.5–15.5)
WBC: 12.8 10*3/uL — ABNORMAL HIGH (ref 4.0–10.5)

## 2016-05-29 LAB — APTT: aPTT: 25 seconds (ref 24–36)

## 2016-05-29 MED ORDER — TOBRAMYCIN SULFATE 1.2 G IJ SOLR
INTRAMUSCULAR | Status: AC | PRN
  Administered 2016-05-29: 1.2 g via TOPICAL

## 2016-05-29 MED ORDER — MIDAZOLAM HCL 2 MG/2ML IJ SOLN
INTRAMUSCULAR | Status: AC | PRN
  Administered 2016-05-29: 1 mg via INTRAVENOUS

## 2016-05-29 MED ORDER — CEFAZOLIN SODIUM-DEXTROSE 2-4 GM/100ML-% IV SOLN
2.0000 g | INTRAVENOUS | Status: AC
  Administered 2016-05-29: 2 g via INTRAVENOUS

## 2016-05-29 MED ORDER — BUPIVACAINE HCL (PF) 0.25 % IJ SOLN
INTRAMUSCULAR | Status: AC
  Filled 2016-05-29: qty 30

## 2016-05-29 MED ORDER — TOBRAMYCIN SULFATE 1.2 G IJ SOLR
INTRAMUSCULAR | Status: AC
  Filled 2016-05-29: qty 1.2

## 2016-05-29 MED ORDER — FENTANYL CITRATE (PF) 100 MCG/2ML IJ SOLN
INTRAMUSCULAR | Status: AC
  Filled 2016-05-29: qty 2

## 2016-05-29 MED ORDER — SODIUM CHLORIDE 0.9 % IV SOLN
INTRAVENOUS | Status: AC
  Administered 2016-05-29: 13:00:00 via INTRAVENOUS

## 2016-05-29 MED ORDER — FENTANYL CITRATE (PF) 100 MCG/2ML IJ SOLN
INTRAMUSCULAR | Status: AC | PRN
  Administered 2016-05-29: 25 ug via INTRAVENOUS

## 2016-05-29 MED ORDER — SODIUM CHLORIDE 0.9 % IV SOLN
INTRAVENOUS | Status: DC
  Administered 2016-05-29: 09:00:00 via INTRAVENOUS

## 2016-05-29 MED ORDER — MIDAZOLAM HCL 2 MG/2ML IJ SOLN
INTRAMUSCULAR | Status: AC
  Filled 2016-05-29: qty 2

## 2016-05-29 MED ORDER — BUPIVACAINE HCL (PF) 0.5 % IJ SOLN
INTRAMUSCULAR | Status: AC | PRN
  Administered 2016-05-29: 30 mL

## 2016-05-29 MED ORDER — CEFAZOLIN SODIUM-DEXTROSE 2-4 GM/100ML-% IV SOLN
INTRAVENOUS | Status: AC
  Filled 2016-05-29: qty 100

## 2016-05-29 NOTE — Sedation Documentation (Signed)
Patient is resting comfortably. 

## 2016-05-29 NOTE — Progress Notes (Signed)
Chief Complaint: Patient was seen in consultation today for sacroplasty at the request of Dr Amado Coe  Referring Physician(s): Dr Amado Coe Dr Eather Colas  Supervising Physician: [name]  Patient Status: [facility] - Out-pt  History of Present Illness: Brittney Gates is a 83 y.o. female   Pt has seen Dr Harl Bowie in [facility] for years for back pain and surgeries- Lumbar fusion and decompression Has had increasing low back pain; buttock pain for several weeks Prednisone and pain meds without significant relief MRI 04/28/16: IMPRESSION: 1. New stress fractures of the sacrum. 2. New left foraminal and extraforaminal disc protrusion at L5-S1 with a mass effect upon the left L5 nerve. 3. Moderately severe spinal stenosis at L1-2, unchanged. 4. Compression of the thecal sac at L3-4 by fat at the site of the laminotomy defect.  Pain is especially with sitting and radiates down both legs  Now scheduled for sacroplasty per Dr Annette Stable Approved with Dr Estanislado Pandy    Past Medical History:  Diagnosis Date  . Acute pancreatitis   . ANEMIA   . ANXIETY   . Arthritis   . Chronic anemia   . Diverticulosis   . Gastritis   . Hemorrhoids   . Hiatal hernia   . HYPERLIPIDEMIA   . HYPERTENSION   . HYPOTHYROIDISM   . LOW BACK PAIN, CHRONIC   . PAD (peripheral artery disease) ([place])     Past Surgical History:  Procedure Laterality Date  . ANGIOPLASTY  1995  . APPENDECTOMY    . BACK SURGERY     x's 2  . BREAST SURGERY     Benign  . FEMUR IM NAIL  01/21/2012   Procedure: INTRAMEDULLARY (IM) NAIL FEMORAL;  Surgeon: Newt Minion, MD;  Location: [facility];  Service: Orthopedics;  Laterality: Left;  Intertrochanteric Nail Left Hip  . HEMORRHOID SURGERY    . TUBAL LIGATION      Allergies: Statins; Contrast media [iodinated diagnostic agents]; and Dexamethasone  Medications: Prior to Admission medications   Medication Sig Start Date End Date Taking? Authorizing Provider  amLODipine-benazepril  (LOTREL) 5-20 MG capsule TAKE 1 CAPSULE BY MOUTH DAILY. 03/03/16  Yes [name], MD  aspirin EC 81 MG tablet Take 81 mg by mouth daily.   Yes Historical Provider, MD  B Complex-C (SUPER B COMPLEX PO) Take 1 tablet by mouth daily.   Yes Historical Provider, MD  Cholecalciferol 2000 UNITS TABS Take 2,000 Units by mouth daily.   Yes Historical Provider, MD  levothyroxine (SYNTHROID, LEVOTHROID) 100 MCG tablet TAKE ONE TABLET BY MOUTH DAILY BEFORE BREAKFAST Patient taking differently: TAKE 100mg  TABLET BY MOUTH DAILY BEFORE BREAKFAST 03/12/16  Yes [name], MD  Lifitegrast Shirley Friar) 5 % SOLN Place 1 drop into both eyes 2 (two) times daily as needed.   Yes Historical Provider, MD  Multiple Vitamin (MULTIVITAMIN WITH MINERALS) TABS tablet Take 1 tablet by mouth daily.   Yes Historical Provider, MD  oxyCODONE (OXY IR/ROXICODONE) 5 MG immediate release tablet Take 1 tablet (5 mg total) by mouth every 6 (six) hours as needed. 05/05/16  Yes Newt Minion, MD  OxyCODONE ER [facility]) 18 MG C12A Take 1 tablet by mouth 2 (two) times daily.   Yes Historical Provider, MD  predniSONE (DELTASONE) 20 MG tablet 1 by mouth every morning with breakfast 05/05/16  Yes Newt Minion, MD  sennosides-docusate sodium (SENOKOT-S) 8.6-50 MG tablet Take 2 tablets by mouth 2 (two) times daily as needed for  constipation.    Yes Historical Provider, MD  zolpidem (AMBIEN) 10 MG tablet Take 1 tablet (10 mg total) by mouth at bedtime as needed for sleep. Patient taking differently: Take 5-10 mg by mouth at bedtime. Pt takes 5mg  right at bedtime, and then another 5mg  after she wakes up to use the restroom in the middle of the night 12/17/15  Yes [name], MD     Family History  Problem Relation Age of Onset  . Lung cancer Father   . Gallbladder disease Son   . Gallbladder disease Daughter   . Colon cancer Neg Hx     Social History   Social History  . Marital status: Widowed    Spouse name: N/A    . Number of children: N/A  . Years of education: N/A   Social History Main Topics  . Smoking status: Never Smoker  . Smokeless tobacco: Never Used  . Alcohol use No  . Drug use: No  . Sexual activity: Not Asked   Other Topics Concern  . 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  All other systems are negative.  Review of Systems  Constitutional: Positive for activity change. Negative for appetite change, fatigue and fever.  Respiratory: Negative for cough and shortness of breath.   Cardiovascular: Negative for chest pain.  Musculoskeletal: Positive for back pain and gait problem.  Neurological: Positive for weakness.  Psychiatric/Behavioral: Negative for behavioral problems and confusion.    Vital Signs: BP (!) 178/69   Pulse 60   Temp 98 F (36.7 C)   Ht 5\' 8"  (1.727 m)   Wt 162 lb (73.5 kg)   SpO2 100%   BMI 24.63 kg/m   Physical Exam  Constitutional: She is oriented to person, place, and time.  Cardiovascular: Normal rate and regular rhythm.   Murmur heard. Pulmonary/Chest: Effort normal and breath sounds normal.  Abdominal: Soft. Bowel sounds are normal.  Musculoskeletal: Normal range of motion.  Low back and buttock pain  Neurological: She is alert and oriented to person, place, and time.  Skin: Skin is warm and dry.  Psychiatric: She has a normal mood and affect. Her behavior is normal. Judgment and thought content normal.  Nursing note and vitals reviewed.   Mallampati Score:  MD Evaluation Airway: WNL Heart: WNL Abdomen: WNL Chest/ Lungs: WNL ASA  Classification: 3 Mallampati/Airway Score: One  Imaging: No results found.  Labs:  CBC:  Recent Labs   10/05/15 1038 04/28/16 0918 04/28/16 1736 05/29/16 0853  WBC 11.5*  --  10.4 12.8*  HGB 10.1* 11.2* 10.1* 11.1*  HCT 31.0* 33.0* 31.0* 33.9*  PLT 256.0  --  259 290    COAGS:  Recent Labs  05/29/16 0853  INR 0.98  APTT 25    BMP:  Recent Labs  10/05/15 1038 04/28/16 0918 04/30/16 0557 05/29/16 0853  NA 136 139 139 133*  K 4.3 4.7 4.5 4.3  CL 102 77* 103 96*  CO2 29  --  26 26  GLUCOSE 102* 105* 162* 156*  BUN 26* 15 28* 13  CALCIUM 9.6  --  9.5 8.8*  CREATININE 0.95 1.00 0.87 0.94  GFRNONAA  --   --  57* 52*  GFRAA  --   --  >60 >60    LIVER FUNCTION TESTS:  Recent Labs  10/05/15 1038  BILITOT 0.8  AST 16  ALT 11  ALKPHOS 61  PROT 7.2  ALBUMIN 4.2    TUMOR MARKERS: No results for input(s): AFPTM, CEA, CA199, CHROMGRNA in the last 8760 hours.  Assessment and Plan:  Sacral insufficiency fracture Worsening pain for weeks meds without significant relief Now scheduled for Sacroplasty Risks and Benefits discussed with the patient including, but not limited to education regarding the natural healing process of compression fractures without intervention, bleeding, infection, cement migration which may cause spinal cord damage, paralysis, pulmonary embolism or even death. All of the patient's questions were answered, patient is agreeable to proceed. Consent signed and in chart.    Thank you for this interesting consult.  I greatly enjoyed meeting LAIYA TALLEY and look forward to participating in their care.  A copy of this report was sent to the requesting provider on this date.  Electronically Signed: Monia Sabal A 05/29/2016, 10:58 AM   I spent a total of  30 Minutes   in face to face in clinical consultation, greater than 50% of which was counseling/coordinating care for sacroplasty

## 2016-05-29 NOTE — Procedures (Signed)
S/P sacroplasty via bilateral approach..  

## 2016-05-29 NOTE — Progress Notes (Signed)
Pt ready foe discharge/awaiting PTAR

## 2016-05-29 NOTE — Discharge Instructions (Signed)
1.No stooping,bending  Or lifting more than 10 lbs for 2 weeks. 2.Use walker to ambulate  For 2 weeks then the cane. 3.RTC PRN 2 weeks KYPHOPLASTY/VERTEBROPLASTY DISCHARGE INSTRUCTIONS  Medications: (check all that apply)     Resume all home medications as before procedure.                  Continue your pain medications as prescribed as needed.  Over the next 3-5 days, decrease your pain medication as tolerated.  Over the counter medications (i.e. Tylenol, ibuprofen, and aleve) may be substituted once severe/moderate pain symptoms have subsided.   Wound Care: - Bandages may be removed the day following your procedure.  You may get your incision wet once bandages are removed.  Bandaids may be used to cover the incisions until scab formation.  Topical ointments are optional.  - If you develop a fever greater than 101 degrees, have increased skin redness at the incision sites or pus-like oozing from incisions occurring within 1 week of the procedure, contact radiology at [phone] or [phone].  - Ice pack to back for 15-20 minutes 2-3 time per day for first 2-3 days post procedure.  The ice will expedite muscle healing and help with the pain from the incisions.   Activity: - Bedrest today with limited activity for 24 hours post procedure.  - No driving for 48 hours.  - Increase your activity as tolerated after bedrest (with assistance if necessary).  - Refrain from any strenuous activity or heavy lifting (greater than 10 lbs.).   Follow up: - Contact radiology at [phone] or [phone] if any questions/concerns.  - A physician assistant from radiology will contact you in approximately 1 week.  - If a biopsy was performed at the time of your procedure, your referring physician should receive the results in usually 2-3 days.

## 2016-05-30 ENCOUNTER — Encounter (HOSPITAL_COMMUNITY): Payer: Self-pay | Admitting: Interventional Radiology

## 2016-06-02 ENCOUNTER — Ambulatory Visit (HOSPITAL_COMMUNITY): Payer: Medicare Other

## 2016-06-06 DIAGNOSIS — E039 Hypothyroidism, unspecified: Secondary | ICD-10-CM | POA: Diagnosis not present

## 2016-06-06 DIAGNOSIS — M549 Dorsalgia, unspecified: Secondary | ICD-10-CM | POA: Diagnosis not present

## 2016-06-06 DIAGNOSIS — R2689 Other abnormalities of gait and mobility: Secondary | ICD-10-CM | POA: Diagnosis not present

## 2016-06-06 DIAGNOSIS — Z9181 History of falling: Secondary | ICD-10-CM | POA: Diagnosis not present

## 2016-06-06 DIAGNOSIS — Z7982 Long term (current) use of aspirin: Secondary | ICD-10-CM | POA: Diagnosis not present

## 2016-06-06 DIAGNOSIS — I10 Essential (primary) hypertension: Secondary | ICD-10-CM | POA: Diagnosis not present

## 2016-06-10 ENCOUNTER — Other Ambulatory Visit (HOSPITAL_COMMUNITY): Payer: Self-pay | Admitting: Interventional Radiology

## 2016-06-10 ENCOUNTER — Other Ambulatory Visit: Payer: Self-pay | Admitting: Internal Medicine

## 2016-06-10 DIAGNOSIS — M4850XA Collapsed vertebra, not elsewhere classified, site unspecified, initial encounter for fracture: Principal | ICD-10-CM

## 2016-06-10 DIAGNOSIS — IMO0001 Reserved for inherently not codable concepts without codable children: Secondary | ICD-10-CM

## 2016-06-12 DIAGNOSIS — Z7982 Long term (current) use of aspirin: Secondary | ICD-10-CM | POA: Diagnosis not present

## 2016-06-12 DIAGNOSIS — Z9181 History of falling: Secondary | ICD-10-CM | POA: Diagnosis not present

## 2016-06-12 DIAGNOSIS — R2689 Other abnormalities of gait and mobility: Secondary | ICD-10-CM | POA: Diagnosis not present

## 2016-06-12 DIAGNOSIS — M549 Dorsalgia, unspecified: Secondary | ICD-10-CM | POA: Diagnosis not present

## 2016-06-12 DIAGNOSIS — E039 Hypothyroidism, unspecified: Secondary | ICD-10-CM | POA: Diagnosis not present

## 2016-06-12 DIAGNOSIS — I10 Essential (primary) hypertension: Secondary | ICD-10-CM | POA: Diagnosis not present

## 2016-06-19 ENCOUNTER — Ambulatory Visit (HOSPITAL_COMMUNITY)
Admission: RE | Admit: 2016-06-19 | Discharge: 2016-06-19 | Disposition: A | Discharge status: Home / Self Care | Payer: Medicare Other | Source: Ambulatory Visit | Attending: Interventional Radiology | Admitting: Interventional Radiology

## 2016-06-19 ENCOUNTER — Telehealth (INDEPENDENT_AMBULATORY_CARE_PROVIDER_SITE_OTHER): Payer: Self-pay | Admitting: Orthopedic Surgery

## 2016-06-19 DIAGNOSIS — M4858XA Collapsed vertebra, not elsewhere classified, sacral and sacrococcygeal region, initial encounter for fracture: Secondary | ICD-10-CM | POA: Diagnosis not present

## 2016-06-19 DIAGNOSIS — E039 Hypothyroidism, unspecified: Secondary | ICD-10-CM | POA: Diagnosis not present

## 2016-06-19 DIAGNOSIS — Z7982 Long term (current) use of aspirin: Secondary | ICD-10-CM | POA: Diagnosis not present

## 2016-06-19 DIAGNOSIS — IMO0001 Reserved for inherently not codable concepts without codable children: Secondary | ICD-10-CM

## 2016-06-19 DIAGNOSIS — I10 Essential (primary) hypertension: Secondary | ICD-10-CM | POA: Diagnosis not present

## 2016-06-19 DIAGNOSIS — R2689 Other abnormalities of gait and mobility: Secondary | ICD-10-CM | POA: Diagnosis not present

## 2016-06-19 DIAGNOSIS — M4850XA Collapsed vertebra, not elsewhere classified, site unspecified, initial encounter for fracture: Principal | ICD-10-CM

## 2016-06-19 DIAGNOSIS — M8448XA Pathological fracture, other site, initial encounter for fracture: Secondary | ICD-10-CM | POA: Diagnosis not present

## 2016-06-19 DIAGNOSIS — M549 Dorsalgia, unspecified: Secondary | ICD-10-CM | POA: Diagnosis not present

## 2016-06-19 DIAGNOSIS — Z9181 History of falling: Secondary | ICD-10-CM | POA: Diagnosis not present

## 2016-06-19 HISTORY — PX: IR GENERIC HISTORICAL: IMG1180011

## 2016-06-19 NOTE — Telephone Encounter (Signed)
PT REQUESTING REFILL OF PREDNISONE OR IF SHE NEEDS TO COME OFF OF IT SHE WOULD LIKE TO [place], [place]   [phone]

## 2016-06-19 NOTE — Telephone Encounter (Signed)
Do you want to refill rx for prednisone

## 2016-06-20 ENCOUNTER — Other Ambulatory Visit (INDEPENDENT_AMBULATORY_CARE_PROVIDER_SITE_OTHER): Payer: Self-pay

## 2016-06-20 MED ORDER — PREDNISONE 20 MG PO TABS: ORAL_TABLET | ORAL | 1 refills | Status: DC

## 2016-06-20 NOTE — Telephone Encounter (Signed)
rx refilled per order and called pt to advise of Dr. Jess Barters instruction. Lm on vm to call with questions.

## 2016-06-20 NOTE — Telephone Encounter (Signed)
Refill the prednisone. Patient does need to follow-up with neurosurgery.

## 2016-06-23 ENCOUNTER — Encounter (HOSPITAL_COMMUNITY): Payer: Self-pay | Admitting: Interventional Radiology

## 2016-06-23 ENCOUNTER — Telehealth (INDEPENDENT_AMBULATORY_CARE_PROVIDER_SITE_OTHER): Payer: Self-pay

## 2016-06-23 NOTE — Telephone Encounter (Signed)
To wean off the prednisone take 10 mg a day for 3 days then 5 mg a day for 3 days then discontinue.

## 2016-06-23 NOTE — Telephone Encounter (Signed)
Patient's son called and states that his mother is taking prednisone 20mg  a day and has been for several weeks. He states that they did meet with a neurosurgeon and that they advised stopping the prednisone "cold [place]" was not a good plan for the pt and he wants to know tapering instructions and how and when they should proceed. They just had refill of medication on Friday.

## 2016-06-23 NOTE — Telephone Encounter (Signed)
I called and sw son to advise.

## 2016-07-08 ENCOUNTER — Ambulatory Visit (INDEPENDENT_AMBULATORY_CARE_PROVIDER_SITE_OTHER): Payer: Medicare Other | Admitting: Internal Medicine

## 2016-07-08 ENCOUNTER — Encounter: Payer: Self-pay | Admitting: Internal Medicine

## 2016-07-08 ENCOUNTER — Telehealth: Payer: Self-pay | Admitting: Internal Medicine

## 2016-07-08 DIAGNOSIS — M5417 Radiculopathy, lumbosacral region: Secondary | ICD-10-CM

## 2016-07-08 MED ORDER — TRIAMCINOLONE ACETONIDE 0.1 % EX CREA: 1.0000 "application " | TOPICAL_CREAM | Freq: Two times a day (BID) | CUTANEOUS | 0 refills | Status: DC

## 2016-07-08 MED ORDER — GABAPENTIN 300 MG PO CAPS: 300.0000 mg | ORAL_CAPSULE | Freq: Three times a day (TID) | ORAL | 3 refills | Status: DC

## 2016-07-08 NOTE — Progress Notes (Signed)
   Subjective:    Patient ID: Brittney Gates, female    DOB: 1924/07/15, 83 y.o.   MRN: [id number]  HPI The patient is an 83 YO female coming in for ongoing pain in her back and radiating into the low legs. She does know that she has bulging discs in her back and has been told that surgery is not an option. She has had a fracture in the pelvis recently and has been undergoing therapy and recovery for the last several months. This has caused more pain and worsening of the burning in her legs. She was on pain medication after the surgery and is not taking that anymore. She has not been tried on anything for this burning pain in the past.   Review of Systems  Constitutional: Positive for activity change and fatigue. Negative for appetite change, diaphoresis and unexpected weight change.  Respiratory: Negative.   Cardiovascular: Negative.   Gastrointestinal: Negative.   Musculoskeletal: Positive for arthralgias, back pain, gait problem and myalgias. Negative for neck pain and neck stiffness.  Skin: Negative.   Neurological: Negative for dizziness, tremors, syncope, weakness, light-headedness and headaches.      Objective:   Physical Exam  Constitutional: She is oriented to person, place, and time. She appears well-developed and well-nourished.  Slightly frail appearing  HENT:  Head: Normocephalic and atraumatic.  Eyes: EOM are normal.  Neck: Normal range of motion.  Cardiovascular: Normal rate and regular rhythm.   Pulmonary/Chest: Effort normal. No respiratory distress. She has no wheezes.  Abdominal: Soft. She exhibits no distension. There is no tenderness.  Musculoskeletal: She exhibits tenderness.  Neurological: She is alert and oriented to person, place, and time. Coordination abnormal.  Walker for ambulation  Skin: Skin is warm and dry.   Vitals:   07/08/16 1334  BP: 134/60  Pulse: 85  Resp: 16  Temp: 97.9 F (36.6 C)  TempSrc: Oral  SpO2: 99%  Weight: 165 lb (74.8 kg)    Height: 5\' 8"  (1.727 m)      Assessment & Plan:

## 2016-07-08 NOTE — Progress Notes (Signed)
Pre visit review using our clinic review tool, if applicable. No additional management support is needed unless otherwise documented below in the visit note. 

## 2016-07-08 NOTE — Telephone Encounter (Signed)
Not due for refill. Sent in 5 month supply back in March.

## 2016-07-08 NOTE — Telephone Encounter (Signed)
Pt requesting refill of zolpidem (AMBIEN) 10 MG tablet.

## 2016-07-08 NOTE — Patient Instructions (Signed)
We have sent in gabapentin for the pain in the back and legs. Try taking it in the evening first to see how it affects you.   If you do okay with it then you can increase to 1 pill twice a day for about a week. If you feel the need to increase take 1 pill three times per day.   We have sent in a cream for your ear.

## 2016-07-09 ENCOUNTER — Other Ambulatory Visit: Payer: Self-pay | Admitting: Internal Medicine

## 2016-07-09 ENCOUNTER — Encounter: Payer: Self-pay | Admitting: Cardiovascular Disease

## 2016-07-09 NOTE — Assessment & Plan Note (Signed)
Rx for gabapentin to help with the pain with gradual titration of levels. She has not tried anything else in the past.

## 2016-07-10 NOTE — Telephone Encounter (Signed)
Patient contacted and aware                                                                                                          

## 2016-07-12 ENCOUNTER — Encounter (HOSPITAL_COMMUNITY): Payer: Self-pay

## 2016-07-12 ENCOUNTER — Emergency Department (HOSPITAL_COMMUNITY)
Admission: EM | Admit: 2016-07-12 | Discharge: 2016-07-12 | Disposition: A | Discharge status: Home / Self Care | Payer: Medicare Other | Attending: Emergency Medicine | Admitting: Emergency Medicine

## 2016-07-12 ENCOUNTER — Emergency Department (HOSPITAL_COMMUNITY): Payer: Medicare Other

## 2016-07-12 DIAGNOSIS — E039 Hypothyroidism, unspecified: Secondary | ICD-10-CM | POA: Insufficient documentation

## 2016-07-12 DIAGNOSIS — N3000 Acute cystitis without hematuria: Secondary | ICD-10-CM | POA: Insufficient documentation

## 2016-07-12 DIAGNOSIS — I10 Essential (primary) hypertension: Secondary | ICD-10-CM | POA: Insufficient documentation

## 2016-07-12 DIAGNOSIS — Z79899 Other long term (current) drug therapy: Secondary | ICD-10-CM | POA: Insufficient documentation

## 2016-07-12 DIAGNOSIS — R1030 Lower abdominal pain, unspecified: Secondary | ICD-10-CM | POA: Diagnosis present

## 2016-07-12 DIAGNOSIS — K6289 Other specified diseases of anus and rectum: Secondary | ICD-10-CM | POA: Diagnosis not present

## 2016-07-12 LAB — CBC WITH DIFFERENTIAL/PLATELET
Basophils Absolute: 0 10*3/uL (ref 0.0–0.1)
Basophils Relative: 0 %
Eosinophils Absolute: 0.1 10*3/uL (ref 0.0–0.7)
Eosinophils Relative: 1 %
HCT: 31.6 % — ABNORMAL LOW (ref 36.0–46.0)
Hemoglobin: 10.6 g/dL — ABNORMAL LOW (ref 12.0–15.0)
Lymphocytes Relative: 45 %
Lymphs Abs: 3.7 10*3/uL (ref 0.7–4.0)
MCH: 34.8 pg — ABNORMAL HIGH (ref 26.0–34.0)
MCHC: 33.5 g/dL (ref 30.0–36.0)
MCV: 103.6 fL — ABNORMAL HIGH (ref 78.0–100.0)
Monocytes Absolute: 0.5 10*3/uL (ref 0.1–1.0)
Monocytes Relative: 6 %
Neutro Abs: 3.9 10*3/uL (ref 1.7–7.7)
Neutrophils Relative %: 48 %
Platelets: 267 10*3/uL (ref 150–400)
RBC: 3.05 MIL/uL — ABNORMAL LOW (ref 3.87–5.11)
RDW: 18.6 % — ABNORMAL HIGH (ref 11.5–15.5)
WBC: 8.1 10*3/uL (ref 4.0–10.5)

## 2016-07-12 LAB — URINALYSIS, ROUTINE W REFLEX MICROSCOPIC
Bilirubin Urine: NEGATIVE
Glucose, UA: NEGATIVE mg/dL
Ketones, ur: NEGATIVE mg/dL
Nitrite: NEGATIVE
Protein, ur: NEGATIVE mg/dL
Specific Gravity, Urine: 1.017 (ref 1.005–1.030)
pH: 6 (ref 5.0–8.0)

## 2016-07-12 LAB — BASIC METABOLIC PANEL
Anion gap: 8 (ref 5–15)
BUN: 13 mg/dL (ref 6–20)
CO2: 27 mmol/L (ref 22–32)
Calcium: 9.2 mg/dL (ref 8.9–10.3)
Chloride: 102 mmol/L (ref 101–111)
Creatinine, Ser: 0.98 mg/dL (ref 0.44–1.00)
GFR calc Af Amer: 58 mL/min — ABNORMAL LOW (ref >60)
GFR calc non Af Amer: 50 mL/min — ABNORMAL LOW (ref >60)
Glucose, Bld: 95 mg/dL (ref 65–99)
Potassium: 4.1 mmol/L (ref 3.5–5.1)
Sodium: 137 mmol/L (ref 135–145)

## 2016-07-12 LAB — POC OCCULT BLOOD, ED: Fecal Occult Bld: NEGATIVE

## 2016-07-12 MED ORDER — DICYCLOMINE HCL 20 MG PO TABS: 20.0000 mg | ORAL_TABLET | Freq: Three times a day (TID) | ORAL | 0 refills | Status: DC | PRN

## 2016-07-12 MED ORDER — CEPHALEXIN 500 MG PO CAPS
500.0000 mg | ORAL_CAPSULE | Freq: Four times a day (QID) | ORAL | 0 refills | Status: DC
Start: 2016-07-12 — End: 2016-07-28

## 2016-07-12 MED ORDER — DEXTROSE 5 % IV SOLN
1.0000 g | Freq: Once | INTRAVENOUS | Status: AC
  Administered 2016-07-12: 1 g via INTRAVENOUS
  Filled 2016-07-12: qty 10

## 2016-07-12 MED ORDER — IOPAMIDOL (ISOVUE-300) INJECTION 61%
INTRAVENOUS | Status: AC
  Administered 2016-07-12: 100 mL
  Filled 2016-07-12: qty 100

## 2016-07-12 NOTE — ED Triage Notes (Signed)
Pt reports that over the last week she has been having to strain to defecate. She denies any blood in her stools, but states that it is painful and she feels a burning in her rectum during and after going. She also reports painful gas immediately after meals. She reports a sacroplasty in March of this year. A&Ox4. Ambulatory with walker.

## 2016-07-12 NOTE — ED Provider Notes (Signed)
[facility] DEPT Provider Note   CSN: [id number] Arrival date & time: 07/12/16  1355     History   Chief Complaint Chief Complaint  Patient presents with  . Rectal Pain  . Pelvic Pain    HPI Brittney Gates is a 83 y.o. female.   Abdominal Pain   This is a new problem. The current episode started more than 2 days ago. The problem occurs daily. The pain is associated with eating. The pain is located in the suprapubic region. The pain is severe. Associated symptoms include frequency. Pertinent negatives include anorexia, melena, nausea, constipation and dysuria. The symptoms are aggravated by certain positions. Nothing relieves the symptoms.    Past Medical History:  Diagnosis Date  . Acute pancreatitis   . ANEMIA   . ANXIETY   . Arthritis   . Chronic anemia   . Diverticulosis   . Gastritis   . Hemorrhoids   . Hiatal hernia   . HYPERLIPIDEMIA   . HYPERTENSION   . HYPOTHYROIDISM   . LOW BACK PAIN, CHRONIC   . PAD (peripheral artery disease) [facility])     Patient Active Problem List   Diagnosis Date Noted  . Spondylosis of lumbar spine 05/05/2016  . Palliative care by specialist 04/30/2016  . Lumbosacral radiculopathy at L5   . Sacral insufficiency fracture with delayed healing   . Lumbar radicular pain 04/28/2016  . Radicular low back pain 04/28/2016  . Routine general medical examination at a health care facility 11/01/2015  . RUQ pain 10/05/2015  . Abnormal CT scan, chest 05/06/2012  . Osteoporosis 04/30/2012  . Hip fracture left 01/20/2012  . PVD (peripheral vascular disease) ([facility])   . ANEMIA 04/24/2010  . Hypothyroidism 08/14/2007  . Hyperlipidemia 08/14/2007  . ANXIETY 08/14/2007  . Essential hypertension 08/14/2007  . ARTHRITIS 08/14/2007  . LOW BACK PAIN, CHRONIC 08/14/2007    Past Surgical History:  Procedure Laterality Date  . ANGIOPLASTY  1995  . APPENDECTOMY    . BACK SURGERY     x's 2  . BREAST SURGERY     Benign  . FEMUR IM NAIL   01/21/2012   Procedure: INTRAMEDULLARY (IM) NAIL FEMORAL;  Surgeon: Newt Minion, MD;  Location: [facility];  Service: Orthopedics;  Laterality: Left;  Intertrochanteric Nail Left Hip  . HEMORRHOID SURGERY    . IR GENERIC HISTORICAL  05/29/2016   IR SACROPLASTY BILATERAL 05/29/2016 [name], MD [facility]  . IR GENERIC HISTORICAL  06/19/2016   IR RADIOLOGIST EVAL & MGMT 06/19/2016 [facility]  . TUBAL LIGATION      OB History    No data available       Home Medications    Prior to Admission medications   Medication Sig Start Date End Date Taking? Authorizing Provider  amLODipine-benazepril (LOTREL) 5-20 MG capsule TAKE 1 CAPSULE BY MOUTH DAILY. 03/03/16  Yes [name], MD  aspirin EC 81 MG tablet Take 81 mg by mouth daily.   Yes Historical Provider, MD  Cholecalciferol 2000 UNITS TABS Take 2,000 Units by mouth daily.   Yes Historical Provider, MD  gabapentin (NEURONTIN) 300 MG capsule Take 1 capsule (300 mg total) by mouth 3 (three) times daily. 07/08/16  Yes [name], MD  levothyroxine (SYNTHROID, LEVOTHROID) 100 MCG tablet TAKE ONE TABLET BY MOUTH DAILY BEFORE BREAKFAST Patient taking differently: TAKE 100 mcg TABLET BY MOUTH DAILY BEFORE BREAKFAST 03/12/16  Yes [name], MD  Multiple Vitamin (MULTIVITAMIN WITH MINERALS) TABS tablet  Take 1 tablet by mouth daily.   Yes Historical Provider, MD  naproxen (NAPROSYN) 500 MG tablet Take 500 mg by mouth 2 (two) times daily with a meal.   Yes Historical Provider, MD  oxyCODONE (OXY IR/ROXICODONE) 5 MG immediate release tablet Take 5 mg by mouth at bedtime. Only uses as needed for pain 05/31/16  Yes Historical Provider, MD  predniSONE (DELTASONE) 20 MG tablet Take 20 mg by mouth daily. 06/22/16  Yes Historical Provider, MD  sennosides-docusate sodium (SENOKOT-S) 8.6-50 MG tablet Take 2 tablets by mouth 2 (two) times daily as needed for constipation.    Yes Historical Provider, MD  triamcinolone cream  (KENALOG) 0.1 % Apply 1 application topically 2 (two) times daily. 07/08/16  Yes [name], MD  zolpidem (AMBIEN) 10 MG tablet TAKE 1 TABLET(S) BY MOUTH EVERY NIGHT AT BEDTIME AS NEEDED FOR SLEEP 07/10/16  Yes [name], MD  cephALEXin (KEFLEX) 500 MG capsule Take 1 capsule (500 mg total) by mouth 4 (four) times daily. 07/12/16   Merrily Pew, MD  dicyclomine (BENTYL) 20 MG tablet Take 1 tablet (20 mg total) by mouth 3 (three) times daily as needed (before meals). 07/12/16   Merrily Pew, MD    Family History Family History  Problem Relation Age of Onset  . Lung cancer Father   . Gallbladder disease Son   . Gallbladder disease Daughter   . Colon cancer Neg Hx     Social History Social History  Substance Use Topics  . Smoking status: Never Smoker  . Smokeless tobacco: Never Used  . Alcohol use No     Allergies   Statins; Contrast media [iodinated diagnostic agents]; and Dexamethasone   Review of Systems Review of Systems  Gastrointestinal: Positive for abdominal pain. Negative for anorexia, constipation, melena and nausea.  Genitourinary: Positive for frequency. Negative for dysuria.  All other systems reviewed and are negative.    Physical Exam Updated Vital Signs BP 120/69 (BP Location: Left Arm)   Pulse 76   Temp 98 F (36.7 C) (Oral)   Resp 12   Ht 5\' 8"  (1.727 m)   Wt 163 lb 6 oz (74.1 kg)   SpO2 97%   BMI 24.84 kg/m   Physical Exam  Constitutional: She appears well-developed and well-nourished.  HENT:  Head: Normocephalic and atraumatic.  Eyes: Conjunctivae and EOM are normal.  Neck: Normal range of motion.  Cardiovascular: Normal rate and regular rhythm.   Pulmonary/Chest: No stridor. No respiratory distress.  Abdominal: Soft. Bowel sounds are normal. She exhibits no distension. There is no tenderness. There is no guarding.  Genitourinary: Rectal exam shows tenderness. Rectal exam shows no external hemorrhoid, no internal hemorrhoid,  no fissure, no mass, anal tone normal and guaiac negative stool.  Neurological: She is alert. No cranial nerve deficit.  Skin: Skin is warm and dry.  Nursing note and vitals reviewed.    ED Treatments / Results  Labs (all labs ordered are listed, but only abnormal results are displayed) Labs Reviewed  CBC WITH DIFFERENTIAL/PLATELET - Abnormal; Notable for the following:       Result Value   RBC 3.05 (*)    Hemoglobin 10.6 (*)    HCT 31.6 (*)    MCV 103.6 (*)    MCH 34.8 (*)    RDW 18.6 (*)    All other components within normal limits  BASIC METABOLIC PANEL - Abnormal; Notable for the following:    GFR calc non Af Amer 50 (*)  GFR calc Af Amer 58 (*)    All other components within normal limits  URINALYSIS, ROUTINE W REFLEX MICROSCOPIC - Abnormal; Notable for the following:    APPearance HAZY (*)    Hgb urine dipstick SMALL (*)    Leukocytes, UA SMALL (*)    Bacteria, UA MANY (*)    Squamous Epithelial / LPF 0-5 (*)    All other components within normal limits  URINE CULTURE  POC OCCULT BLOOD, ED    EKG  EKG Interpretation None       Radiology Ct Abdomen Pelvis W Contrast  Result Date: 07/12/2016 CLINICAL DATA:  Constipation, rectal pain. EXAM: CT ABDOMEN AND PELVIS WITH CONTRAST TECHNIQUE: Multidetector CT imaging of the abdomen and pelvis was performed using the standard protocol following bolus administration of intravenous contrast. CONTRAST:  193mL ISOVUE-300 IOPAMIDOL (ISOVUE-300) INJECTION 61% COMPARISON:  10/15/2015 CT abdomen.  12/25/2011 runoff CT angiogram. FINDINGS: Lower chest: Patchy subpleural reticulation and parenchymal banding at the lung bases, indicating nonspecific scarring. Coronary atherosclerosis. Hepatobiliary: Normal liver with no liver mass. Normal gallbladder with no radiopaque cholelithiasis. No intrahepatic biliary ductal dilatation. Common bile duct diameter 7 mm, stable since 10/15/2015 and top normal for age. Stable small to moderate  periampullary duodenal diverticulum. No radiopaque choledocholithiasis. Pancreas: Stable diffuse pancreatic parenchymal atrophy with no pancreatic mass or duct dilation. Spleen: Normal size. No mass. Adrenals/Urinary Tract: Normal adrenals. No hydronephrosis. No renal masses. Small to moderate cystocele. Otherwise normal bladder. Stomach/Bowel: Small to moderate hiatal hernia. Otherwise grossly normal stomach. Normal caliber small bowel with no small bowel wall thickening. Appendectomy. Marked sigmoid diverticulosis, with no large bowel wall thickening or pericolonic fat stranding. Minimal colonic stool. Probable small right-sided rectocele. Vascular/Lymphatic: Atherosclerotic nonaneurysmal abdominal aorta. Patent portal, splenic, hepatic and renal veins. No pathologically enlarged lymph nodes in the abdomen or pelvis. Reproductive: Grossly normal uterus.  No adnexal mass. Other: No pneumoperitoneum, ascites or focal fluid collection. Musculoskeletal: No aggressive appearing focal osseous lesions. Bilateral sacral insufficiency fractures status post bilateral methylmethacrylate augmentation. Percent visualized surgical hardware in the proximal left femur from ORIF. Status post bilateral posterior spinal fusion at L4-5. Marked thoracolumbar spondylosis. Chronic L1 vertebral compression fracture status post vertebroplasty. IMPRESSION: 1. Evidence of pelvic floor laxity including small to moderate cystocele and probable small right-sided rectocele. Outpatient MR defecography could be ordered as clinically warranted. 2. No evidence of bowel obstruction or acute bowel inflammation. Minimal colorectal stool. 3. Additional findings include aortic atherosclerosis, coronary atherosclerosis, small to moderate hiatal hernia, marked sigmoid diverticulosis and L1 vertebral compression fracture and bilateral sacral insufficiency fractures status post methylmethacrylate augmentation. Electronically Signed   By: Ilona Sorrel M.D.    On: 07/12/2016 18:34    Procedures Procedures (including critical care time)  Medications Ordered in ED Medications  iopamidol (ISOVUE-300) 61 % injection (100 mLs  Contrast Given 07/12/16 1759)  cefTRIAXone (ROCEPHIN) 1 g in dextrose 5 % 50 mL IVPB (0 g Intravenous Stopped 07/12/16 2009)     Initial Impression / Assessment and Plan / ED Course  I have reviewed the triage vital signs and the nursing notes.  Pertinent labs & imaging results that were available during my care of the patient were reviewed by me and considered in my medical decision making (see chart for details).     Likely UTI. Culture sent, started on rocephin, dc on keflex.   No definitive cause for her abdominal pain. States it is worse after starting to eat/burning in nature/pain around rectum. Also with tenismus  worsened recently. Will try a trial of bentyl and recommend GI follow up. No e/o obstruction, adhesion, hernia or infectious cause for her symptoms.   Final Clinical Impressions(s) / ED Diagnoses   Final diagnoses:  Acute cystitis without hematuria    New Prescriptions Discharge Medication List as of 07/12/2016  8:00 PM    START taking these medications   Details  cephALEXin (KEFLEX) 500 MG capsule Take 1 capsule (500 mg total) by mouth 4 (four) times daily., Starting Sat 07/12/2016, Print    dicyclomine (BENTYL) 20 MG tablet Take 1 tablet (20 mg total) by mouth 3 (three) times daily as needed (before meals)., Starting Sat 07/12/2016, Print         Merrily Pew, MD 07/12/16 2029

## 2016-07-14 LAB — URINE CULTURE: Culture: NO GROWTH

## 2016-07-18 ENCOUNTER — Telehealth: Payer: Self-pay | Admitting: Internal Medicine

## 2016-07-18 NOTE — Telephone Encounter (Signed)
Please advise 

## 2016-07-18 NOTE — Telephone Encounter (Signed)
Pt son states gabapentin (NEURONTIN) 300 MG capsule  Is not working for the patient.   Can something else be called in. Last visit there was another mediation discussed can that be called in.

## 2016-07-18 NOTE — Telephone Encounter (Signed)
Is this helping at all? How is she taking the medicine?

## 2016-07-21 ENCOUNTER — Telehealth (HOSPITAL_COMMUNITY): Payer: Self-pay

## 2016-07-21 NOTE — Telephone Encounter (Signed)
LVM with son asking how shes taking and if its helping at all

## 2016-07-21 NOTE — Telephone Encounter (Signed)
Pt called and stated that she was having burning in her cheeks and lower portion of her spine. Very uncomfortable burning sensation. Called pt back to have her f/u with PCP per Deveshwar. No answer. No vm. AW

## 2016-07-23 ENCOUNTER — Telehealth: Payer: Self-pay | Admitting: Internal Medicine

## 2016-07-23 NOTE — Telephone Encounter (Signed)
Patient states she does not like the side effects of changing mood and thoughts with the neurontin.  Patient is requesting a different medication in place of this.  [facility] uses [facility] on [address]

## 2016-07-23 NOTE — Telephone Encounter (Signed)
Please advise 

## 2016-07-24 NOTE — Telephone Encounter (Signed)
This is the best first medication to try for her condition and would recommend for her to try.

## 2016-07-24 NOTE — Telephone Encounter (Signed)
Patient stated she is going to think about taking it but is still not sure and said she will let us know

## 2016-07-28 ENCOUNTER — Ambulatory Visit (INDEPENDENT_AMBULATORY_CARE_PROVIDER_SITE_OTHER): Payer: Medicare Other | Admitting: Cardiovascular Disease

## 2016-07-28 ENCOUNTER — Encounter: Payer: Self-pay | Admitting: Cardiovascular Disease

## 2016-07-28 VITALS — BP 106/40 | HR 76 | Ht 68.0 in | Wt 165.6 lb

## 2016-07-28 DIAGNOSIS — E785 Hyperlipidemia, unspecified: Secondary | ICD-10-CM

## 2016-07-28 DIAGNOSIS — I10 Essential (primary) hypertension: Secondary | ICD-10-CM | POA: Diagnosis not present

## 2016-07-28 NOTE — Patient Instructions (Signed)

## 2016-07-28 NOTE — Progress Notes (Signed)
Cardiology Office Note Date:  07/28/2016   ID:  Brittney Gates, DOB 06-10-1924, MRN [id number]  PCP:  [name], MD  Cardiologist:  Sherren Mocha, MD    Chief Complaint  Patient presents with  . Essential Hypertension     History of Present Illness: Brittney Gates is a 83 y.o. female who presents for follow-up evaluation. She has a history of chest pain with negative stress testing in the past. Also has been noted to have a heart murmur - echo in 2017 showed aortic sclerosis without stenosis.   She has a feeling of chest fatigue when she repositions herself. Otherwise she is doing well. This feeling may last for 10-15 minutes then resolves on it's own. She is able to walk short distances without similar symptoms in the chest. Most limited by back problems.her legs are weak. Ambulates with a walker. She's had sacroplasty and ruptured disks in the back.   Past Medical History:  Diagnosis Date  . Acute pancreatitis   . ANEMIA   . ANXIETY   . Arthritis   . Chronic anemia   . Diverticulosis   . Gastritis   . Hemorrhoids   . Hiatal hernia   . HYPERLIPIDEMIA   . HYPERTENSION   . HYPOTHYROIDISM   . LOW BACK PAIN, CHRONIC   . PAD (peripheral artery disease) ([facility])     Past Surgical History:  Procedure Laterality Date  . ANGIOPLASTY  1995  . APPENDECTOMY    . BACK SURGERY     x's 2  . BREAST SURGERY     Benign  . FEMUR IM NAIL  01/21/2012   Procedure: INTRAMEDULLARY (IM) NAIL FEMORAL;  Surgeon: Newt Minion, MD;  Location: [facility];  Service: Orthopedics;  Laterality: Left;  Intertrochanteric Nail Left Hip  . HEMORRHOID SURGERY    . IR GENERIC HISTORICAL  05/29/2016   IR SACROPLASTY BILATERAL 05/29/2016 [name], MD [facility]  . IR GENERIC HISTORICAL  06/19/2016   IR RADIOLOGIST EVAL & MGMT 06/19/2016 [facility]  . TUBAL LIGATION      Current Outpatient Prescriptions  Medication Sig Dispense Refill  . amLODipine-benazepril (LOTREL) 5-20 MG capsule TAKE  1 CAPSULE BY MOUTH DAILY. 90 capsule 2  . aspirin EC 81 MG tablet Take 81 mg by mouth daily.    . Cholecalciferol 2000 UNITS TABS Take 2,000 Units by mouth daily.    Marland Kitchen gabapentin (NEURONTIN) 300 MG capsule Take 300 mg by mouth at bedtime.    Marland Kitchen levothyroxine (SYNTHROID, LEVOTHROID) 100 MCG tablet Take 100 mcg by mouth daily before breakfast.    . Multiple Vitamin (MULTIVITAMIN WITH MINERALS) TABS tablet Take 1 tablet by mouth daily.    . naproxen (NAPROSYN) 500 MG tablet Take 500 mg by mouth 2 (two) times daily as needed (pain).     Marland Kitchen sennosides-docusate sodium (SENOKOT-S) 8.6-50 MG tablet Take 2 tablets by mouth 2 (two) times daily as needed for constipation.     Marland Kitchen zolpidem (AMBIEN) 10 MG tablet TAKE 1 TABLET(S) BY MOUTH EVERY NIGHT AT BEDTIME AS NEEDED FOR SLEEP 30 tablet 4   No current facility-administered medications for this visit.     Allergies:   Statins   Social History:  The patient  reports that she has never smoked. She has never used smokeless tobacco. She reports that she does not drink alcohol or use drugs.   Family History:  The patient's  family history includes Gallbladder disease in her daughter and son; Lung  cancer in her father.    ROS:  Please see the history of present illness.  Otherwise, review of systems is positive for hearing loss, back pain, muscle pain, easy bruising, constipation, balance problems.  All other systems are reviewed and negative.    PHYSICAL EXAM: VS:  BP (!) 106/40   Pulse 76   Ht 5\' 8"  (1.727 m)   Wt 165 lb 9.6 oz (75.1 kg)   BMI 25.18 kg/m  , BMI Body mass index is 25.18 kg/m. GEN: Well nourished, well developed, pleasant elderly woman in no acute distress  HEENT: normal  Neck: no JVD, no masses. No carotid bruits Cardiac: RRR with 2/6 systolic murmur at the RUSB, no diastolic murmur               Respiratory:  clear to auscultation bilaterally, normal work of breathing GI: soft, nontender, nondistended, + BS MS: no deformity or  atrophy  Ext: no pretibial edema, pedal pulses 2+= bilaterally Skin: warm and dry, no rash Neuro:  Strength and sensation are intact Psych: euthymic mood, full affect  EKG:  EKG is ordered today. The ekg ordered today shows NSR 77 bpm, nonspecific ST-T wave abnormality  Recent Labs: 10/05/2015: ALT 11 04/28/2016: TSH 3.983 07/12/2016: BUN 13; Creatinine, Ser 0.98; Hemoglobin 10.6; Platelets 267; Potassium 4.1; Sodium 137   Lipid Panel     Component Value Date/Time   CHOL 196 10/30/2014 1208   TRIG 147.0 10/30/2014 1208   TRIG 120 03/29/2010   HDL 51.00 10/30/2014 1208   CHOLHDL 4 10/30/2014 1208   VLDL 29.4 10/30/2014 1208   LDLCALC 116 (H) 10/30/2014 1208   LDLDIRECT 143.1 12/08/2012 1223      Wt Readings from Last 3 Encounters:  07/28/16 165 lb 9.6 oz (75.1 kg)  07/12/16 163 lb 6 oz (74.1 kg)  07/08/16 165 lb (74.8 kg)     Cardiac Studies Reviewed: 2D Echo 07-26-2015: Study Conclusions  - Left ventricle: The cavity size was normal. Wall thickness was   normal. Systolic function was normal. The estimated ejection   fraction was in the range of 50% to 55%. Wall motion was normal;   there were no regional wall motion abnormalities. Doppler   parameters are consistent with abnormal left ventricular   relaxation (grade 1 diastolic dysfunction). - Aortic valve: Trileaflet; mildly thickened, mildly calcified   leaflets. Transvalvular velocity was minimally increased. There   was no stenosis. Peak velocity (S): 216 cm/s. - Aorta: Ascending aortic diameter: 39 mm (S). - Ascending aorta: The ascending aorta was mildly dilated. - Mitral valve: Calcified annulus. There was trivial regurgitation. - Left atrium: The atrium was mildly dilated.  ASSESSMENT AND PLAN: 1.  Heart murmur: echo reviewed and murmur related to aortic valve sclerosis. No further evaluation indicated.  2. Essential HTN: BP well-controlled on current Rx.   Current medicines are reviewed with the patient  today.  The patient does not have concerns regarding medicines.  Labs/ tests ordered today include:  No orders of the defined types were placed in this encounter.   Disposition:   FU one year  Signed, Sherren Mocha, MD  07/28/2016 1:58 PM    [facility] [place], [place], [place]  [address] Phone: [phone]; Fax: [phone]

## 2016-07-29 ENCOUNTER — Ambulatory Visit (INDEPENDENT_AMBULATORY_CARE_PROVIDER_SITE_OTHER): Payer: Medicare Other | Admitting: Orthopedic Surgery

## 2016-07-29 DIAGNOSIS — M5416 Radiculopathy, lumbar region: Secondary | ICD-10-CM

## 2016-07-29 DIAGNOSIS — M5417 Radiculopathy, lumbosacral region: Secondary | ICD-10-CM | POA: Diagnosis not present

## 2016-07-29 NOTE — Progress Notes (Signed)
Office Visit Note   Patient: Brittney Gates           Date of Birth: 14-Dec-1924           MRN: [id number] Visit Date: 07/29/2016              Requested by: [name], MD [place], [place] [id number] PCP: [name], MD  No chief complaint on file.     HPI: Patient is an 83 year old woman with severe osteoporosis she status post lumbar spine fusion status post vertebral plasty and status post sacral plasty who complains of increasing burning radicular pain in the right hip down the lateral aspect of the right leg demonstrates the foot. She states the prednisone did not help very much. She states the oxycodone helped but she does not want to take narcotics. She states she takes Neurontin 300 mg at night and she states that this does help. She's recently been seen at the emergency room for her radicular pain. Patient is following Dr. Trenton Gammon for her lumbar spine and has undergone  laminoplasty with him.  Assessment & Plan: Visit Diagnoses:  1. Lumbar back pain with radiculopathy affecting right lower extremity     Plan: Recommended vitamin D3 recommended exercise recommend that she can increase her Neurontin to twice a day and then 3 times a day as needed for pain recommend against narcotics discussed the importance of strengthening exercising.  Follow-Up Instructions: Return in about 4 weeks (around 08/26/2016).   Ortho Exam  Patient is alert, oriented, no adenopathy, well-dressed, normal affect, normal respiratory effort. Patient has difficulty getting from a sitting to standing position she has antalgic gait. She has no pain with range of motion hip near ankle she has a positive straight leg raise on the right. Examination of her right hip she has no pain with passive range of motion radiographs does show some mild arthritis but she has no groin pain. Patient has symmetric motor strength in both lower extremities but she is weak in all motor groups of  both lower extremities with no focal motor weakness. I reviewed her CT scan of her lumbar spine which shows advanced degenerative collapse with osteoporosis with previous compression fractures with large osteophytic bone spurs.  Imaging: No results found.  Labs: Lab Results  Component Value Date   HGBA1C 6.0 03/29/2010   REPTSTATUS 07/14/2016 FINAL 07/12/2016   CULT  07/12/2016    NO GROWTH Performed at [facility], [place] [address], [place], [place] [address]    [place] 01/21/2012    Orders:  No orders of the defined types were placed in this encounter.  No orders of the defined types were placed in this encounter.    Procedures: No procedures performed  Clinical Data: No additional findings.  ROS:  All other systems negative, except as noted in the HPI. Review of Systems  Objective: Vital Signs: There were no vitals taken for this visit.  Specialty Comments:  No specialty comments available.  PMFS History: Patient Active Problem List   Diagnosis Date Noted  . Spondylosis of lumbar spine 05/05/2016  . Palliative care by specialist 04/30/2016  . Lumbosacral radiculopathy at L5   . Sacral insufficiency fracture with delayed healing   . Lumbar radicular pain 04/28/2016  . Radicular low back pain 04/28/2016  . Routine general medical examination at a health care facility 11/01/2015  . RUQ pain 10/05/2015  . Abnormal CT scan, chest 05/06/2012  .  Osteoporosis 04/30/2012  . Hip fracture left 01/20/2012  . PVD (peripheral vascular disease) ([facility])   . ANEMIA 04/24/2010  . Hypothyroidism 08/14/2007  . Hyperlipidemia 08/14/2007  . ANXIETY 08/14/2007  . Essential hypertension 08/14/2007  . ARTHRITIS 08/14/2007  . Lumbar back pain with radiculopathy affecting right lower extremity 08/14/2007   Past Medical History:  Diagnosis Date  . Acute pancreatitis   . ANEMIA   . ANXIETY   . Arthritis   . Chronic anemia   . Diverticulosis   .  Gastritis   . Hemorrhoids   . Hiatal hernia   . HYPERLIPIDEMIA   . HYPERTENSION   . HYPOTHYROIDISM   . LOW BACK PAIN, CHRONIC   . PAD (peripheral artery disease) ([facility])     Family History  Problem Relation Age of Onset  . Lung cancer Father   . Gallbladder disease Son   . Gallbladder disease Daughter   . Colon cancer Neg Hx     Past Surgical History:  Procedure Laterality Date  . ANGIOPLASTY  1995  . APPENDECTOMY    . BACK SURGERY     x's 2  . BREAST SURGERY     Benign  . FEMUR IM NAIL  01/21/2012   Procedure: INTRAMEDULLARY (IM) NAIL FEMORAL;  Surgeon: Newt Minion, MD;  Location: [facility];  Service: Orthopedics;  Laterality: Left;  Intertrochanteric Nail Left Hip  . HEMORRHOID SURGERY    . IR GENERIC HISTORICAL  05/29/2016   IR SACROPLASTY BILATERAL 05/29/2016 [name], MD [facility]  . IR GENERIC HISTORICAL  06/19/2016   IR RADIOLOGIST EVAL & MGMT 06/19/2016 [facility]  . TUBAL LIGATION     Social History   Occupational History  . Not on file.   Social History Main Topics  . Smoking status: Never Smoker  . Smokeless tobacco: Never Used  . Alcohol use No  . Drug use: No  . Sexual activity: Not on file

## 2016-08-08 ENCOUNTER — Ambulatory Visit (INDEPENDENT_AMBULATORY_CARE_PROVIDER_SITE_OTHER): Payer: Medicare Other | Admitting: Family

## 2016-08-08 ENCOUNTER — Encounter: Payer: Self-pay | Admitting: Family

## 2016-08-08 ENCOUNTER — Telehealth: Payer: Self-pay

## 2016-08-08 ENCOUNTER — Ambulatory Visit (INDEPENDENT_AMBULATORY_CARE_PROVIDER_SITE_OTHER)
Admission: RE | Admit: 2016-08-08 | Discharge: 2016-08-08 | Disposition: A | Discharge status: Home / Self Care | Payer: Medicare Other | Source: Ambulatory Visit | Attending: Family | Admitting: Family

## 2016-08-08 VITALS — BP 124/76 | HR 88 | Temp 97.6°F | Resp 16 | Ht 68.0 in | Wt 167.4 lb

## 2016-08-08 DIAGNOSIS — M25475 Effusion, left foot: Secondary | ICD-10-CM

## 2016-08-08 DIAGNOSIS — M25473 Effusion, unspecified ankle: Secondary | ICD-10-CM | POA: Insufficient documentation

## 2016-08-08 DIAGNOSIS — M7989 Other specified soft tissue disorders: Secondary | ICD-10-CM | POA: Diagnosis not present

## 2016-08-08 MED ORDER — LEVOTHYROXINE SODIUM 100 MCG PO TABS: 100.0000 ug | ORAL_TABLET | Freq: Every day | ORAL | 0 refills | Status: DC

## 2016-08-08 NOTE — Telephone Encounter (Signed)
Please advise 

## 2016-08-08 NOTE — Progress Notes (Signed)
Subjective:    Patient ID: Brittney Gates, female    DOB: 06-10-1924, 83 y.o.   MRN: [id number]  Chief Complaint  Patient presents with  . Foot Swelling    left foot swelling, gets worse in the evening, there is some soreness around the heel and the top part of the foot    HPI:  Brittney Gates is a 83 y.o. female who  has a past medical history of Acute pancreatitis; ANEMIA; ANXIETY; Arthritis; Chronic anemia; Diverticulosis; Gastritis; Hemorrhoids; Hiatal hernia; HYPERLIPIDEMIA; HYPERTENSION; HYPOTHYROIDISM; LOW BACK PAIN, CHRONIC; and PAD (peripheral artery disease) ([place]). and presents today for an acute office visit.  This is a new problem. Associated symptoms of pain and swelling located in her left foot has been gong on for a while with worsening within the last week. Describes swelling that worsens over the course of the day and improves when she sleeps but is not completely resolved. Does not eat a lot of salt. Endorses that she doets not drink a lot of water. She is maintained on Lotrel for several years and no recent changes.   Allergies  Allergen Reactions  . Statins Other (See Comments)    Leg weakness      Outpatient Medications Prior to Visit  Medication Sig Dispense Refill  . amLODipine-benazepril (LOTREL) 5-20 MG capsule TAKE 1 CAPSULE BY MOUTH DAILY. 90 capsule 2  . aspirin EC 81 MG tablet Take 81 mg by mouth daily.    . Cholecalciferol 2000 UNITS TABS Take 2,000 Units by mouth daily.    . Multiple Vitamin (MULTIVITAMIN WITH MINERALS) TABS tablet Take 1 tablet by mouth daily.    . naproxen (NAPROSYN) 500 MG tablet Take 500 mg by mouth 2 (two) times daily as needed (pain).     Marland Kitchen sennosides-docusate sodium (SENOKOT-S) 8.6-50 MG tablet Take 2 tablets by mouth 2 (two) times daily as needed for constipation.     Marland Kitchen zolpidem (AMBIEN) 10 MG tablet TAKE 1 TABLET(S) BY MOUTH EVERY NIGHT AT BEDTIME AS NEEDED FOR SLEEP 30 tablet 4  . levothyroxine (SYNTHROID, LEVOTHROID) 100 MCG  tablet Take 100 mcg by mouth daily before breakfast.    . gabapentin (NEURONTIN) 300 MG capsule Take 300 mg by mouth at bedtime.     No facility-administered medications prior to visit.       Past Surgical History:  Procedure Laterality Date  . ANGIOPLASTY  1995  . APPENDECTOMY    . BACK SURGERY     x's 2  . BREAST SURGERY     Benign  . FEMUR IM NAIL  01/21/2012   Procedure: INTRAMEDULLARY (IM) NAIL FEMORAL;  Surgeon: Newt Minion, [place];  Location: [facility];  Service: Orthopedics;  Laterality: Left;  Intertrochanteric Nail Left Hip  . HEMORRHOID SURGERY    . IR GENERIC HISTORICAL  05/29/2016   IR SACROPLASTY BILATERAL 05/29/2016 [name], MD [facility]  . IR GENERIC HISTORICAL  06/19/2016   IR RADIOLOGIST EVAL & MGMT 06/19/2016 [facility]  . TUBAL LIGATION        Past Medical History:  Diagnosis Date  . Acute pancreatitis   . ANEMIA   . ANXIETY   . Arthritis   . Chronic anemia   . Diverticulosis   . Gastritis   . Hemorrhoids   . Hiatal hernia   . HYPERLIPIDEMIA   . HYPERTENSION   . HYPOTHYROIDISM   . LOW BACK PAIN, CHRONIC   . PAD (peripheral artery disease) ([place])  Review of Systems  Constitutional: Negative for chills and fever.  Musculoskeletal:       Positive for left foot edema      Objective:    BP 124/76 (BP Location: Left Arm, Patient Position: Sitting, Cuff Size: Normal)   Pulse 88   Temp 97.6 F (36.4 C) (Oral)   Resp 16   Ht 5\' 8"  (1.727 m)   Wt 167 lb 6.4 oz (75.9 kg)   SpO2 95%   BMI 25.45 kg/m  Nursing note and vital signs reviewed.  Physical Exam  Constitutional: She is oriented to person, place, and time. She appears well-developed and well-nourished. No distress.  Cardiovascular: Normal rate, regular rhythm, normal heart sounds and intact distal pulses.   Pulmonary/Chest: Effort normal and breath sounds normal.  Musculoskeletal:  Left ankle / foot - No obvious deformity or discoloration with moderate edema in the  distal foot and proximal ankle. Range of motion decreased in all directions secondary to numbness. Passive dorsiflexion restricted secondary to muscle tightness. No creptius or deformity. Strength is decreased 2-3+. Pulses are present. Sensation is diminished.   Neurological: She is alert and oriented to person, place, and time.  Skin: Skin is warm and dry.  Psychiatric: She has a normal mood and affect. Her behavior is normal. Judgment and thought content normal.       Assessment & Plan:   Problem List Items Addressed This Visit      Musculoskeletal and Integument   Swelling of foot joint, left - Primary    Chronic foot swelling with new onset worsening in the setting of no significant trauma. There is generalized tenderness and discomfort over Achilles tendon with concern for Achilles tendinitis. Is on amlodipine which may be a contributing factor. Obtain x-ray to rule out fracture. Treat conservatively with ice, home exercise therapy and continue current dosage of naproxen. Elevate foot when seated and compression/wrap sock. Low sodium diet. Consider ABI if symptoms do not improve and x-rays negative.       Relevant Orders   DG Foot Complete Left (Completed)       I have discontinued Ms. Gaede's gabapentin. I am also having her maintain her aspirin EC, Cholecalciferol, multivitamin with minerals, amLODipine-benazepril, sennosides-docusate sodium, zolpidem, and naproxen.    A total of 25 minutes were spent face-to-face with the patient during this encounter and over half of that time was spent on counseling and coordination of care.  We discussed differential diagnosis that could result in foot swelling, home exercise therapy, and when to seek additional care.   Follow-up: Return if symptoms worsen or fail to improve.  Mauricio Po, FNP

## 2016-08-08 NOTE — Addendum Note (Signed)
Addended by: Pricilla Holm A on: 08/08/2016 04:32 PM   Modules accepted: Orders

## 2016-08-08 NOTE — Telephone Encounter (Signed)
Pt is requesting a refill of thyroid medication.

## 2016-08-08 NOTE — Assessment & Plan Note (Signed)
Chronic foot swelling with new onset worsening in the setting of no significant trauma. There is generalized tenderness and discomfort over Achilles tendon with concern for Achilles tendinitis. Is on amlodipine which may be a contributing factor. Obtain x-ray to rule out fracture. Treat conservatively with ice, home exercise therapy and continue current dosage of naproxen. Elevate foot when seated and compression/wrap sock. Low sodium diet. Consider ABI if symptoms do not improve and x-rays negative.

## 2016-08-08 NOTE — Patient Instructions (Addendum)
Thank you for choosing [facility].  SUMMARY AND INSTRUCTIONS:  Please ice your foot / ankle x 20 minutes every 2 hours and as needed following activity.   Elevate your leg above your heart.  Watch your sodium intake.  May need to consider a circulation test or change the amlodipine.  Take your naproxen 2x per day for 3-4 days.   Compression wrap / compression socks as needed.   Follow up:  If your symptoms worsen or fail to improve, please contact our office for further instruction, or in case of emergency go directly to the emergency room at the closest medical facility.    Achilles Tendinitis Rehab Ask your health care provider which exercises are safe for you. Do exercises exactly as told by your health care provider and adjust them as directed. It is normal to feel mild stretching, pulling, tightness, or discomfort as you do these exercises, but you should stop right away if you feel sudden pain or your pain gets worse. Do not begin these exercises until told by your health care provider. Stretching and range of motion exercises These exercises warm up your muscles and joints and improve the movement and flexibility of your ankle. These exercises also help to relieve pain, numbness, and tingling. Exercise A: Standing wall calf stretch, knee straight   1. Stand with your hands against a wall. 2. Extend your __________ leg behind you and bend your front knee slightly. Keep both of your heels on the floor. 3. Point the toes of your back foot slightly inward. 4. Keeping your heels on the floor and your back knee straight, shift your weight toward the wall. Do not allow your back to arch. You should feel a gentle stretch in your calf. 5. Hold this position for seconds. Repeat __________ times. Complete this stretch __________ times per day. Exercise B: Standing wall calf stretch, knee bent  1. Stand with your hands against a wall. 2. Extend your __________ leg behind you,  and bend your front knee slightly. Keep both of your heels on the floor. 3. Point the toes of your back foot slightly inward. 4. Keeping your heels on the floor, unlock your back knee so that it is bent. You should feel a gentle stretch deep in your calf. 5. Hold this position for __________ seconds. Repeat __________ times. Complete this stretch __________ times per day. Strengthening exercises These exercises build strength and control of your ankle. Endurance is the ability to use your muscles for a long time, even after they get tired. Exercise C: Plantar flexion with band   1. Sit on the floor with your __________ leg extended. You may put a pillow under your calf to give your foot more room to move. 2. Loop a rubber exercise band or tube around the ball of your __________ foot. The ball of your foot is on the walking surface, right under your toes. The band or tube should be slightly tense when your foot is relaxed. If the band or tube slips, you can put on your shoe or put a washcloth between the band and your foot to help it stay in place. 3. Slowly point your toes downward, pushing them away from you. 4. Hold this position for __________ seconds. 5. Slowly release the tension in the band or tube, controlling smoothly until your foot is back to the starting position. Repeat __________ times. Complete this exercise __________ times per day. Exercise D: Heel raise with eccentric lower   1. Stand  on a step with the balls of your feet. The ball of your foot is on the walking surface, right under your toes.  Do not put your heels on the step.  For balance, rest your hands on the wall or on a railing. 2. Rise up onto the balls of your feet. 3. Keeping your heels up, shift all of your weight to your __________ leg and pick up your other leg. 4. Slowly lower your __________ leg so your heel drops below the level of the step. 5. Put down your foot. If told by your health care provider, build  up to:  3 sets of 15 repetitions while keeping your knees straight.  3 sets of 15 repetitions while keeping your knees bent as far as told by your health care provider. Complete this exercise __________ times per day. If this exercise is too easy, try doing it while wearing a backpack with weights in it. Balance exercises These exercises improve or maintain your balance. Balance is important in preventing falls. Exercise E: Single leg stand  1. Without shoes, stand near a railing or in a door frame. Hold on to the railing or door frame as needed. 2. Stand on your __________ foot. Keep your big toe down on the floor and try to keep your arch lifted. 3. Hold this position for __________ seconds. Repeat __________ times. Complete this exercise __________ times per day. If this exercise is too easy, you can try it with your eyes closed or while standing on a pillow. This information is not intended to replace advice given to you by your health care provider. Make sure you discuss any questions you have with your health care provider. Document Released: 10/16/2004 Document Revised: 11/22/2015 Document Reviewed: 11/21/2014 Elsevier Interactive Patient Education  2017 [employer].   Ankle Sprain, Phase I Rehab Ask your health care provider which exercises are safe for you. Do exercises exactly as told by your health care provider and adjust them as directed. It is normal to feel mild stretching, pulling, tightness, or discomfort as you do these exercises, but you should stop right away if you feel sudden pain or your pain gets worse.Do not begin these exercises until told by your health care provider. Stretching and range of motion exercises These exercises warm up your muscles and joints and improve the movement and flexibility of your lower leg and ankle. These exercises also help to relieve pain and stiffness. Exercise A: Gastroc and soleus stretch   6. Sit on the floor with your left /  right leg extended. 7. Loop a belt or towel around the ball of your left / right foot. The ball of your foot is on the walking surface, right under your toes. 8. Keep your left / right ankle and foot relaxed and keep your knee straight while you use the belt or towel to pull your foot toward you. You should feel a gentle stretch behind your calf or knee. 9. Hold this position for __________ seconds, then release to the starting position. Repeat the exercise with your knee bent. You can put a pillow or a rolled bath towel under your knee to support it. You should feel a stretch deep in your calf or at your Achilles tendon. Repeat each stretch __________ times. Complete these stretches __________ times a day. Exercise B: Ankle alphabet   1. Sit with your left / right leg supported at the lower leg.  Do not rest your foot on anything.  Make sure  your foot has room to move freely. 2. Think of your left / right foot as a paintbrush, and move your foot to trace each letter of the alphabet in the air. Keep your hip and knee still while you trace. Make the letters as large as you can without feeling discomfort. 3. Trace every letter from A to Z. Repeat __________ times. Complete this exercise __________ times a day. Strengthening exercises These exercises build strength and endurance in your ankle and lower leg. Endurance is the ability to use your muscles for a long time, even after they get tired. Exercise C: Dorsiflexors   6. Secure a rubber exercise band or tube to an object, such as a table leg, that will stay still when the band is pulled. Secure the other end around your left / right foot. 7. Sit on the floor facing the object, with your left / right leg extended. The band or tube should be slightly tense when your foot is relaxed. 8. Slowly bring your foot toward you, pulling the band tighter. 9. Hold this position for __________ seconds. 10. Slowly return your foot to the starting  position. Repeat __________ times. Complete this exercise __________ times a day. Exercise D: Plantar flexors   6. Sit on the floor with your left / right leg extended. 7. Loop a rubber exercise tube or band around the ball of your left / right foot. The ball of your foot is on the walking surface, right under your toes.  Hold the ends of the band or tube in your hands.  The band or tube should be slightly tense when your foot is relaxed. 8. Slowly point your foot and toes downward, pushing them away from you. 9. Hold this position for __________ seconds. 10. Slowly return your foot to the starting position. Repeat __________ times. Complete this exercise __________ times a day. Exercise E: Evertors  1. Sit on the floor with your legs straight out in front of you. 2. Loop a rubber exercise band or tube around the ball of your left / right foot. The ball of your foot is on the walking surface, right under your toes.  Hold the ends of the band in your hands, or secure the band to a stable object.  The band or tube should be slightly tense when your foot is relaxed. 3. Slowly push your foot outward, away from your other leg. 4. Hold this position for __________ seconds. 5. Slowly return your foot to the starting position. Repeat __________ times. Complete this exercise __________ times a day. This information is not intended to replace advice given to you by your health care provider. Make sure you discuss any questions you have with your health care provider. Document Released: 10/16/2004 Document Revised: 11/22/2015 Document Reviewed: 01/29/2015 Elsevier Interactive Patient Education  2017 [employer].

## 2016-08-12 ENCOUNTER — Telehealth: Payer: Self-pay | Admitting: Cardiovascular Disease

## 2016-08-12 NOTE — Telephone Encounter (Signed)
Agree with plans for compression stockings. thanks

## 2016-08-12 NOTE — Telephone Encounter (Signed)
I spoke with the pt and she complains of left foot swelling for the past few weeks.  The pt's swelling goes down during the night and then worsens as the day progresses. The pt also complains of a quick shooting pain in her leg, numbness at times and pain in her big toe. The pt has multiple broken blood vessels on her left foot. The pt has a history of lumbar back surgery and an ABI was recently checked 11/26/15 (ABI right 1.1 left .87) . The pt also takes Lotrel which could cause swelling. The pt tries to elevate her legs during the day and she watches her salt intake.  I advised the pt that she can try OTC compression stockings to see if this helps her swelling. The pt will try this for a few weeks and if she does not have improvement then she will contact the office. I will forward this message to Dr Burt Knack for review.

## 2016-08-12 NOTE — Telephone Encounter (Signed)
New message    Pt c/o swelling: STAT is pt has developed SOB within 24 hours  1. How long have you been experiencing swelling? 3 weeks  2. Where is the swelling located? Feet  3.  Are you currently taking a "fluid pill"? No  4.  Are you currently SOB? No  5.  Have you traveled recently? No

## 2016-08-26 ENCOUNTER — Ambulatory Visit (INDEPENDENT_AMBULATORY_CARE_PROVIDER_SITE_OTHER): Payer: Medicare Other | Admitting: Orthopedic Surgery

## 2016-08-26 ENCOUNTER — Encounter (INDEPENDENT_AMBULATORY_CARE_PROVIDER_SITE_OTHER): Payer: Self-pay

## 2016-09-08 ENCOUNTER — Ambulatory Visit (INDEPENDENT_AMBULATORY_CARE_PROVIDER_SITE_OTHER): Payer: Medicare Other | Admitting: Internal Medicine

## 2016-09-08 ENCOUNTER — Ambulatory Visit (INDEPENDENT_AMBULATORY_CARE_PROVIDER_SITE_OTHER)
Admission: RE | Admit: 2016-09-08 | Discharge: 2016-09-08 | Disposition: A | Discharge status: Home / Self Care | Payer: Medicare Other | Source: Ambulatory Visit | Attending: Internal Medicine | Admitting: Internal Medicine

## 2016-09-08 ENCOUNTER — Encounter: Payer: Self-pay | Admitting: Internal Medicine

## 2016-09-08 VITALS — BP 144/60 | HR 79 | Temp 97.3°F | Resp 16 | Wt 164.0 lb

## 2016-09-08 DIAGNOSIS — R0781 Pleurodynia: Secondary | ICD-10-CM

## 2016-09-08 DIAGNOSIS — R079 Chest pain, unspecified: Secondary | ICD-10-CM | POA: Diagnosis not present

## 2016-09-08 DIAGNOSIS — M47814 Spondylosis without myelopathy or radiculopathy, thoracic region: Secondary | ICD-10-CM | POA: Diagnosis not present

## 2016-09-08 NOTE — Progress Notes (Signed)
Subjective:    Patient ID: Brittney Gates, female    DOB: 22-Feb-1925, 83 y.o.   MRN: [id number]  HPI She is here for an acute visit.   Left lower rib pain:  She has pain and a heavy feeling in her left lateral ribs and LUQ/flank.  It started on 6/6.  It started suddenly.  The pain is constant, but varies in intensity.  She has pain at rest and with activity.  The pain is worse with deep breaths and movement most of the time, but not always.  Nothing makes the pain better.  The past two days her pain is a little better.  The past couple of nights she has slept differently and it seems to have helped.   She denies any rash or skin changes.  She denies itch or burning sensation.   She takes naprosyn 2-3 times a day.  She has not tolerated gabapentin.    CT Abdomen/pelvis  [07/12/16]:  IMPRESSION: 1. Evidence of pelvic floor laxity including small to moderate cystocele and probable small right-sided rectocele. Outpatient MR defecography could be ordered as clinically warranted. 2. No evidence of bowel obstruction or acute bowel inflammation. Minimal colorectal stool. 3. Additional findings include aortic atherosclerosis, coronary atherosclerosis, small to moderate hiatal hernia, marked sigmoid diverticulosis and L1 vertebral compression fracture and bilateral sacral insufficiency fractures status post methylmethacrylate augmentation.  Medications and allergies reviewed with patient and updated if appropriate.  Patient Active Problem List   Diagnosis Date Noted  . Swelling of foot joint, left 08/08/2016  . Spondylosis of lumbar spine 05/05/2016  . Palliative care by specialist 04/30/2016  . Lumbosacral radiculopathy at L5   . Sacral insufficiency fracture with delayed healing   . Lumbar radicular pain 04/28/2016  . Radicular low back pain 04/28/2016  . Routine general medical examination at a health care facility 11/01/2015  . RUQ pain 10/05/2015  . Abnormal CT scan, chest  05/06/2012  . Osteoporosis 04/30/2012  . Hip fracture left 01/20/2012  . PVD (peripheral vascular disease) ([place])   . ANEMIA 04/24/2010  . Hypothyroidism 08/14/2007  . Hyperlipidemia 08/14/2007  . ANXIETY 08/14/2007  . Essential hypertension 08/14/2007  . ARTHRITIS 08/14/2007  . Lumbar back pain with radiculopathy affecting right lower extremity 08/14/2007    Current Outpatient Prescriptions on File Prior to Visit  Medication Sig Dispense Refill  . amLODipine-benazepril (LOTREL) 5-20 MG capsule TAKE 1 CAPSULE BY MOUTH DAILY. 90 capsule 2  . aspirin EC 81 MG tablet Take 81 mg by mouth daily.    . Cholecalciferol 2000 UNITS TABS Take 2,000 Units by mouth daily.    Marland Kitchen levothyroxine (SYNTHROID, LEVOTHROID) 100 MCG tablet Take 1 tablet (100 mcg total) by mouth daily before breakfast. 90 tablet 0  . Multiple Vitamin (MULTIVITAMIN WITH MINERALS) TABS tablet Take 1 tablet by mouth daily.    . naproxen (NAPROSYN) 500 MG tablet Take 500 mg by mouth 2 (two) times daily as needed (pain).     Marland Kitchen sennosides-docusate sodium (SENOKOT-S) 8.6-50 MG tablet Take 2 tablets by mouth 2 (two) times daily as needed for constipation.     Marland Kitchen zolpidem (AMBIEN) 10 MG tablet TAKE 1 TABLET(S) BY MOUTH EVERY NIGHT AT BEDTIME AS NEEDED FOR SLEEP 30 tablet 4   No current facility-administered medications on file prior to visit.     Past Medical History:  Diagnosis Date  . Acute pancreatitis   . ANEMIA   . ANXIETY   . Arthritis   . Chronic  anemia   . Diverticulosis   . Gastritis   . Hemorrhoids   . Hiatal hernia   . HYPERLIPIDEMIA   . HYPERTENSION   . HYPOTHYROIDISM   . LOW BACK PAIN, CHRONIC   . PAD (peripheral artery disease) ([place])     Past Surgical History:  Procedure Laterality Date  . ANGIOPLASTY  1995  . APPENDECTOMY    . BACK SURGERY     x's 2  . BREAST SURGERY     Benign  . FEMUR IM NAIL  01/21/2012   Procedure: INTRAMEDULLARY (IM) NAIL FEMORAL;  Surgeon: Newt Minion, MD;  Location: [place];   Service: Orthopedics;  Laterality: Left;  Intertrochanteric Nail Left Hip  . HEMORRHOID SURGERY    . IR GENERIC HISTORICAL  05/29/2016   IR SACROPLASTY BILATERAL 05/29/2016 [name], MD [facility]  . IR GENERIC HISTORICAL  06/19/2016   IR RADIOLOGIST EVAL & MGMT 06/19/2016 [facility]  . TUBAL LIGATION      Social History   Social History  . Marital status: Widowed    Spouse name: N/A  . Number of children: N/A  . Years of education: N/A   Social History Main Topics  . Smoking status: Never Smoker  . Smokeless tobacco: Never Used  . Alcohol use No  . Drug use: No  . Sexual activity: Not on file   Other Topics Concern  . 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 Lung cancer Father   . Gallbladder disease Son   . Gallbladder disease Daughter   . Colon cancer Neg Hx     Review of Systems  Constitutional: Negative for chills and fever.  Respiratory: Negative for cough, shortness of breath and wheezing.   Cardiovascular: Positive for chest pain (heaviness in chest with walking - has had cardiac evaluation). Negative for palpitations and leg swelling.  Gastrointestinal: Positive for abdominal pain (LUQ - related to left lateral ribs) and constipation (takes a laxative). Negative for diarrhea.  Genitourinary: Negative for dysuria and hematuria.  Skin: Negative for color change and rash.  Neurological: Negative for light-headedness and headaches.       Objective:   Vitals:   09/08/16 1535  BP: (!) 144/60  Pulse: 79  Resp: 16  Temp: 97.3 F (36.3 C)   Filed Weights   09/08/16 1535  Weight: 164 lb (74.4 kg)   Body mass index is 24.94 kg/m.  Wt Readings from Last 3 Encounters:  09/08/16 164 lb (74.4 kg)  08/08/16  167 lb 6.4 oz (75.9 kg)  07/28/16 165 lb 9.6 oz (75.1 kg)     Physical Exam  Constitutional: She appears well-developed and well-nourished. No distress.  HENT:  Head: Normocephalic and atraumatic.  Eyes: Conjunctivae are normal.  Neck: Neck supple. No tracheal deviation present. No thyromegaly present.  Cardiovascular: Normal rate and regular rhythm.   Pulmonary/Chest: Effort normal and breath sounds normal. No respiratory distress. She has no wheezes. She has no rales.  Abdominal: Soft. She exhibits no distension. There is tenderness (mild LUQ tenderness with very light palpation ). There is no rebound and no guarding.  Musculoskeletal:  No thoracic spine tenderness; tenderness with light palpation left lower lateral and anterior  ribs, pain with palpation in left side/flank  Lymphadenopathy:    She has no cervical adenopathy.  Skin: Skin is warm and dry. No rash noted. She is not diaphoretic.          Assessment & Plan:   See Problem List for Assessment and Plan of chronic medical problems.

## 2016-09-08 NOTE — Assessment & Plan Note (Signed)
Pain is focused in the lower anterior and lateral ribs and just below the ribs - it is acting as if the pain is musculoskeletal in nature ? Thoracic radiculopathy, rib fracture T-spine xray, left sided rib/cxr She did have a recent Ct of her abdomen - no concerning abnormalities to make me think the pain is abdominal Continue naprosyn; has not tolerated gabapentin in the past Will evaluate further depending on xray results

## 2016-09-08 NOTE — Patient Instructions (Signed)
Have x-rays today.  We will call you with the results tomorrow.    Continue the naprosyn twice a day.

## 2016-09-16 ENCOUNTER — Ambulatory Visit (INDEPENDENT_AMBULATORY_CARE_PROVIDER_SITE_OTHER): Payer: Medicare Other | Admitting: Nurse Practitioner

## 2016-09-16 ENCOUNTER — Encounter: Payer: Self-pay | Admitting: Nurse Practitioner

## 2016-09-16 VITALS — BP 128/50 | HR 75 | Ht 68.0 in | Wt 162.0 lb

## 2016-09-16 DIAGNOSIS — M791 Myalgia: Secondary | ICD-10-CM | POA: Diagnosis not present

## 2016-09-16 DIAGNOSIS — M7918 Myalgia, other site: Secondary | ICD-10-CM

## 2016-09-16 NOTE — Patient Instructions (Signed)
If you are age 81 or older, your body mass index should be between 23-30. Your Body mass index is 24.63 kg/m. If this is out of the aforementioned range listed, please consider follow up with your Primary Care Provider.  If you are age 56 or younger, your body mass index should be between 19-25. Your Body mass index is 24.63 kg/m. If this is out of the aformentioned range listed, please consider follow up with your Primary Care Provider.   Follow up as needed.  Thank you for choosing me and [facility].   Tye Savoy, NP

## 2016-09-16 NOTE — Progress Notes (Signed)
HPI:  Patient is an 83 year old female previously followed by Dr. Olevia Gates. She was evaluated in 2010 for upper abdominal pain . EGD revealed a small hiatal hernia and mild gastritis. She had a complete colonoscopy in 2008 for evaluation of constipation. Findings included severe left-sided diverticulosis with luminal stenosis. No polyps or cancers were seen.  Brittney Gates is for is here with her son for evaluation of left rib and LUQ pain which started acutely on 09/03/16. Pain was nonradiating, she cannot characterize pain other than it just hurt. No trauma to the area. Pain persisted for couple of weeks at which time it was nearly constant and unaffected by eating. She had no associated nausea or vomiting, coughing or SOB. Her bowel movements were at baseline.  It hurt and was impossible to sleep on her RIGHT side like she has done for years. Pain was unaffected by deep breaths, coughing or twisting. She took Naprosyn for the pain for several days and now pain downgraded to just an "awareness". She has no urinary symptoms. No rashes or skin lesions.    Past Medical History:  Diagnosis Date  . Acute pancreatitis   . ANEMIA   . ANXIETY   . Arthritis   . Chronic anemia   . Diverticulosis   . Gastritis   . Hemorrhoids   . Hiatal hernia   . HYPERLIPIDEMIA   . HYPERTENSION   . HYPOTHYROIDISM   . LOW BACK PAIN, CHRONIC   . PAD (peripheral artery disease) ([place])      Past Surgical History:  Procedure Laterality Date  . ANGIOPLASTY  1995  . APPENDECTOMY    . BACK SURGERY     x's 2  . BREAST SURGERY     Benign  . FEMUR IM NAIL  01/21/2012   Procedure: INTRAMEDULLARY (IM) NAIL FEMORAL;  Surgeon: Newt Minion, MD;  Location: [facility];  Service: Orthopedics;  Laterality: Left;  Intertrochanteric Nail Left Hip  . HEMORRHOID SURGERY    . IR GENERIC HISTORICAL  05/29/2016   IR SACROPLASTY BILATERAL 05/29/2016 [name], MD [facility]  . IR GENERIC HISTORICAL  06/19/2016   IR  RADIOLOGIST EVAL & MGMT 06/19/2016 [facility]  . TUBAL LIGATION     Family History  Problem Relation Age of Onset  . Lung cancer Father   . Gallbladder disease Son   . Gallbladder disease Daughter   . Colon cancer Neg Hx    Social History  Substance Use Topics  . Smoking status: Never Smoker  . Smokeless tobacco: Never Used  . Alcohol use No   Current Outpatient Prescriptions  Medication Sig Dispense Refill  . amLODipine-benazepril (LOTREL) 5-20 MG capsule TAKE 1 CAPSULE BY MOUTH DAILY. 90 capsule 2  . aspirin EC 81 MG tablet Take 81 mg by mouth daily.    . Cholecalciferol 2000 UNITS TABS Take 2,000 Units by mouth daily.    Marland Kitchen levothyroxine (SYNTHROID, LEVOTHROID) 100 MCG tablet Take 1 tablet (100 mcg total) by mouth daily before breakfast. 90 tablet 0  . Multiple Vitamin (MULTIVITAMIN WITH MINERALS) TABS tablet Take 1 tablet by mouth daily.    . naproxen (NAPROSYN) 500 MG tablet Take 500 mg by mouth 2 (two) times daily as needed (pain).     Marland Kitchen sennosides-docusate sodium (SENOKOT-S) 8.6-50 MG tablet Take 2 tablets by mouth 2 (two) times daily as needed for constipation.     Marland Kitchen zolpidem (AMBIEN) 10 MG tablet TAKE 1 TABLET(S) BY MOUTH EVERY NIGHT AT  BEDTIME AS NEEDED FOR SLEEP 30 tablet 4   No current facility-administered medications for this visit.    Allergies  Allergen Reactions  . Statins Other (See Comments)    Leg weakness     Review of Systems: Positive for back pain, hearing problems, heart murmur, sleeping problems, swelling of feet, and urinary incontinence All other systems reviewed and negative except where noted in HPI.    Physical Exam: BP (!) 128/50   Pulse 75   Ht 5\' 8"  (1.727 m)   Wt 162 lb (73.5 kg)   BMI 24.63 kg/m  Constitutional:  Well-developed white female in no acute distress. Psychiatric: Normal mood and affect. Behavior is normal. EENT: Pupils normal.  Conjunctivae are normal. No scleral icterus. Neck supple.  Cardiovascular: Normal rate,  regular rhythm. No edema Pulmonary/chest: Effort normal and breath sounds normal. No wheezing, rales or rhonchi. Abdominal: Soft, nondistended. Nontender. Bowel sounds active throughout. There are no masses palpable. No hepatomegaly. Lymphadenopathy: No cervical adenopathy noted. Neurological: Alert and oriented to person place and time. Skin: Skin is warm and dry. No rashes noted.   ASSESSMENT AND PLAN:   83 yo female with left lower rib cage pain / non-radiating LUQ pain. Pain started on 09/03/16, was constant for two weeks, treated with Naprosyn and now just an "awareness".  There was never any associated nausea, vomiting, fevers, cough, or  SOB. Rib and thoracic spine xrays negative for fracture. It has been painful for her to lay on her RIGHT side but on exam the discomfort couldn't be reproduced when lying on right side. The bottom of her lower left rib cage is quite tender to palpation.  Of note, she had a CT scan of the abdomen and pelvis with contrast in mid April for constipation and rectal pain. No acute findings. Spleen was normal. Pancreas with stable, diffuse pancreatic parenchymal atrophy  -Suspect musculoskeletal origin of pain which has nearly resolved with time and NSAIDS. No longer requiring naprosyn. Patient will call us back should she develop any GI symptoms such as nausea / vomiting / upper abdominal pain. She would like Dr. Silverio Decamp to assume her care since Dr. Olevia Gates has retired.   -Requests Dr. Deloris Ping, NP  09/16/2016, 3:01 PM  Cc:  [name], *

## 2016-09-19 NOTE — Progress Notes (Signed)
Reviewed and agree with documentation and assessment and plan. K. Veena Nandigam , MD   

## 2016-10-06 ENCOUNTER — Other Ambulatory Visit (INDEPENDENT_AMBULATORY_CARE_PROVIDER_SITE_OTHER): Payer: Self-pay | Admitting: Orthopaedic Surgery

## 2016-10-09 ENCOUNTER — Ambulatory Visit: Payer: Medicare Other | Admitting: Internal Medicine

## 2016-10-09 ENCOUNTER — Ambulatory Visit: Payer: Medicare Other | Admitting: Nurse Practitioner

## 2016-10-20 ENCOUNTER — Ambulatory Visit (INDEPENDENT_AMBULATORY_CARE_PROVIDER_SITE_OTHER)
Admission: RE | Admit: 2016-10-20 | Discharge: 2016-10-20 | Disposition: A | Discharge status: Home / Self Care | Payer: Medicare Other | Source: Ambulatory Visit | Attending: Nurse Practitioner | Admitting: Nurse Practitioner

## 2016-10-20 ENCOUNTER — Encounter: Payer: Self-pay | Admitting: Nurse Practitioner

## 2016-10-20 ENCOUNTER — Ambulatory Visit (INDEPENDENT_AMBULATORY_CARE_PROVIDER_SITE_OTHER): Payer: Medicare Other | Admitting: Nurse Practitioner

## 2016-10-20 ENCOUNTER — Other Ambulatory Visit (INDEPENDENT_AMBULATORY_CARE_PROVIDER_SITE_OTHER): Payer: Medicare Other

## 2016-10-20 VITALS — BP 140/60 | HR 66 | Temp 97.7°F | Ht 68.0 in | Wt 164.0 lb

## 2016-10-20 DIAGNOSIS — R079 Chest pain, unspecified: Secondary | ICD-10-CM | POA: Diagnosis not present

## 2016-10-20 DIAGNOSIS — E039 Hypothyroidism, unspecified: Secondary | ICD-10-CM | POA: Diagnosis not present

## 2016-10-20 DIAGNOSIS — R071 Chest pain on breathing: Secondary | ICD-10-CM

## 2016-10-20 LAB — LIPASE: Lipase: 11 U/L (ref 11.0–59.0)

## 2016-10-20 LAB — AMYLASE: Amylase: 36 U/L (ref 27–131)

## 2016-10-20 NOTE — Progress Notes (Signed)
Subjective:  Patient ID: Brittney Gates, female    DOB: 23-Jan-1925  Age: 83 y.o. MRN: [id number]  CC: Pain (painful under left ribs go around the side--3 days ago/cant lay on left side and painful when walk)   Chest Pain   This is a new problem. The current episode started 1 to 4 weeks ago. The onset quality is gradual. The problem occurs intermittently. The problem has been waxing and waning. The pain is present in the lateral region. The quality of the pain is described as dull. Radiates to: left lower chest wall to left flank region. Associated symptoms include back pain. Pertinent negatives include no abdominal pain, cough, diaphoresis, dizziness, exertional chest pressure, fever, malaise/fatigue, nausea, numbness, orthopnea, palpitations, shortness of breath or weakness. She has tried NSAIDs, rest and acetaminophen for the symptoms. The treatment provided no relief. Risk factors include post-menopausal, lack of exercise and being elderly.   Denies any hx of varicella or shingles  Outpatient Medications Prior to Visit  Medication Sig Dispense Refill  . amLODipine-benazepril (LOTREL) 5-20 MG capsule TAKE 1 CAPSULE BY MOUTH DAILY. 90 capsule 2  . aspirin EC 81 MG tablet Take 81 mg by mouth daily.    . Cholecalciferol 2000 UNITS TABS Take 2,000 Units by mouth daily.    . Multiple Vitamin (MULTIVITAMIN WITH MINERALS) TABS tablet Take 1 tablet by mouth daily.    . sennosides-docusate sodium (SENOKOT-S) 8.6-50 MG tablet Take 2 tablets by mouth 2 (two) times daily as needed for constipation.     Marland Kitchen zolpidem (AMBIEN) 10 MG tablet TAKE 1 TABLET(S) BY MOUTH EVERY NIGHT AT BEDTIME AS NEEDED FOR SLEEP 30 tablet 4  . levothyroxine (SYNTHROID, LEVOTHROID) 100 MCG tablet Take 1 tablet (100 mcg total) by mouth daily before breakfast. 90 tablet 0  . naproxen (NAPROSYN) 500 MG tablet TAKE ONE TABLET BY MOUTH TWICE DAILY WITH FOOD AS NEEDED FOR PAIN ANDINFLAMMATION 60 tablet 0   No facility-administered  medications prior to visit.     ROS See HPI  Objective:  BP 140/60   Pulse 66   Temp 97.7 F (36.5 C)   Ht 5\' 8"  (1.727 m)   Wt 164 lb (74.4 kg)   SpO2 100%   BMI 24.94 kg/m   BP Readings from Last 3 Encounters:  10/20/16 140/60  09/16/16 (!) 128/50  09/08/16 (!) 144/60    Wt Readings from Last 3 Encounters:  10/20/16 164 lb (74.4 kg)  09/16/16 162 lb (73.5 kg)  09/08/16 164 lb (74.4 kg)    Physical Exam  Constitutional: She is oriented to person, place, and time.  Cardiovascular: Normal rate and normal heart sounds.   Pulmonary/Chest: Effort normal and breath sounds normal. No respiratory distress. She exhibits tenderness.  Musculoskeletal: She exhibits tenderness.  Neurological: She is alert and oriented to person, place, and time.  Skin: Skin is warm and dry. No rash noted. No erythema.  Vitals reviewed.   Lab Results  Component Value Date   WBC 8.1 07/12/2016   HGB 10.6 (L) 07/12/2016   HCT 31.6 (L) 07/12/2016   PLT 267 07/12/2016   GLUCOSE 95 07/12/2016   CHOL 196 10/30/2014   TRIG 147.0 10/30/2014   HDL 51.00 10/30/2014   LDLDIRECT 143.1 12/08/2012   LDLCALC 116 (H) 10/30/2014   ALT 11 10/05/2015   AST 16 10/05/2015   NA 137 07/12/2016   K 4.1 07/12/2016   CL 102 07/12/2016   CREATININE 0.98 07/12/2016   BUN 13 07/12/2016  CO2 27 07/12/2016   TSH 1.26 10/20/2016   INR 0.98 05/29/2016   HGBA1C 6.0 03/29/2010    Dg Ribs Unilateral W/chest Left  Result Date: 09/08/2016 CLINICAL DATA:  Left anterior and flank pain for 5 days EXAM: LEFT RIBS AND CHEST - 3+ VIEW COMPARISON:  07/12/2016, 04/20/2013, 11/10/2014 FINDINGS: Single-view chest demonstrates diffuse coarse interstitial opacities, likely chronic. No focal consolidation or effusion. Stable cardiomediastinal silhouette with atherosclerosis. No pneumothorax. Degenerative changes at the left shoulder. Left rib series demonstrates no acute displaced left rib fracture. IMPRESSION: 1. No radiographic  evidence for acute cardiopulmonary abnormality 2. No acute displaced left rib fracture Electronically Signed   By: Donavan Foil M.D.   On: 09/08/2016 21:32   Dg Thoracic Spine W/swimmers  Result Date: 09/08/2016 CLINICAL DATA:  Flank pain EXAM: THORACIC SPINE - 3 VIEWS COMPARISON:  CT 11/10/2014 FINDINGS: Thoracic alignment is within normal limits. There are degenerative osteophytes. Vertebral augmentation changes at L1. IMPRESSION: Degenerative changes.  No acute osseous abnormality. Electronically Signed   By: Donavan Foil M.D.   On: 09/08/2016 21:34    Assessment & Plan:   Aveleen was seen today for pain.  Diagnoses and all orders for this visit:  Pain of anterior chest wall with respiration -     DG Chest 2 View; Future -     Lipase; Future -     Amylase; Future -     meloxicam (MOBIC) 7.5 MG tablet; Take 1 tablet (7.5 mg total) by mouth daily.  Hypothyroidism, unspecified type -     TSH; Future -     T4, free; Future -     levothyroxine (SYNTHROID, LEVOTHROID) 100 MCG tablet; Take 1 tablet (100 mcg total) by mouth daily before breakfast.   I have discontinued Ms. Bonfield's naproxen. I am also having her start on meloxicam. Additionally, I am having her maintain her aspirin EC, Cholecalciferol, multivitamin with minerals, amLODipine-benazepril, sennosides-docusate sodium, zolpidem, and levothyroxine.  Meds ordered this encounter  Medications  . meloxicam (MOBIC) 7.5 MG tablet    Sig: Take 1 tablet (7.5 mg total) by mouth daily.    Dispense:  14 tablet    Refill:  0    Order Specific Question:   Supervising Provider    Answer:   Cassandria Anger [[id number]]  . levothyroxine (SYNTHROID, LEVOTHROID) 100 MCG tablet    Sig: Take 1 tablet (100 mcg total) by mouth daily before breakfast.    Dispense:  90 tablet    Refill:  3    Order Specific Question:   Supervising Provider    Answer:   Cassandria Anger [[id number]]    Follow-up: Return if symptoms worsen or fail to  improve.  Wilfred Lacy, NP

## 2016-10-21 LAB — T4, FREE: Free T4: 1.18 ng/dL (ref 0.60–1.60)

## 2016-10-21 LAB — TSH: TSH: 1.26 u[IU]/mL (ref 0.35–4.50)

## 2016-10-21 MED ORDER — MELOXICAM 7.5 MG PO TABS: 7.5000 mg | ORAL_TABLET | Freq: Every day | ORAL | 0 refills | Status: DC

## 2016-10-21 MED ORDER — LEVOTHYROXINE SODIUM 100 MCG PO TABS: 100.0000 ug | ORAL_TABLET | Freq: Every day | ORAL | 3 refills | Status: DC

## 2016-10-21 NOTE — Patient Instructions (Signed)
No acute finding on CXR. Normal pancreatic enzymes.  Use meloxicam as pain

## 2016-12-24 ENCOUNTER — Ambulatory Visit (INDEPENDENT_AMBULATORY_CARE_PROVIDER_SITE_OTHER): Payer: Medicare Other | Admitting: Internal Medicine

## 2016-12-24 ENCOUNTER — Encounter: Payer: Self-pay | Admitting: Internal Medicine

## 2016-12-24 DIAGNOSIS — B9789 Other viral agents as the cause of diseases classified elsewhere: Secondary | ICD-10-CM

## 2016-12-24 DIAGNOSIS — J069 Acute upper respiratory infection, unspecified: Secondary | ICD-10-CM | POA: Diagnosis not present

## 2016-12-24 MED ORDER — PREGABALIN 50 MG PO CAPS: 50.0000 mg | ORAL_CAPSULE | Freq: Two times a day (BID) | ORAL | 2 refills | Status: DC

## 2016-12-24 MED ORDER — PREDNISONE 20 MG PO TABS: 40.0000 mg | ORAL_TABLET | Freq: Every day | ORAL | 0 refills | Status: DC

## 2016-12-24 NOTE — Patient Instructions (Addendum)
We have sent in the prednisone to take. Take 2 pills a day for 5 days and if you are not feeling better by Friday call us back.  We have given you the prescription for lyrica to try 1 pill twice a day for the pain in the low back.

## 2016-12-26 NOTE — Progress Notes (Signed)
   Subjective:    Patient ID: Brittney Gates, female    DOB: 1924/05/14, 83 y.o.   MRN: [id number]  HPI The patient is a 83 year old female coming in for cold and flu symptoms. She was exposed to sick contact who lives with her who had bronchitis. She started about 2-3 days ago having cough, runny nose, clear mucus, mild shortness of breath with exertion, chills. She did not take her temperature. She has been taking aspirin since onset of symptoms. She has not tried any other interventions. She denies headaches, chest pains. Overall symptoms are worsening. She is concerned that she has had serious breathing problems in the past  Review of Systems  Constitutional: Positive for activity change, appetite change and chills. Negative for fatigue, fever and unexpected weight change.  HENT: Positive for congestion, postnasal drip and rhinorrhea. Negative for ear discharge, ear pain, nosebleeds, sinus pain, sinus pressure, sore throat and trouble swallowing.   Eyes: Negative.   Respiratory: Positive for cough and shortness of breath. Negative for chest tightness and wheezing.   Cardiovascular: Negative.   Gastrointestinal: Negative.   Neurological: Negative.       Objective:   Physical Exam  Constitutional: She is oriented to person, place, and time. She appears well-developed and well-nourished.  HENT:  Head: Normocephalic and atraumatic.  Right Ear: External ear normal.  Left Ear: External ear normal.  Oropharynx with erythema and clear drainage, nasal turbinates are swollen.  Eyes: Pupils are equal, round, and reactive to light. EOM are normal.  Neck: Normal range of motion. No JVD present.  Cardiovascular: Normal rate and regular rhythm.   Pulmonary/Chest: Effort normal and breath sounds normal. No respiratory distress. She has no wheezes. She has no rales. She exhibits no tenderness.  Abdominal: Soft.  Lymphadenopathy:    She has no cervical adenopathy.  Neurological: She is alert and  oriented to person, place, and time.  Skin: Skin is warm and dry.   Vitals:   12/24/16 1110  BP: 120/60  Pulse: 80  Temp: 98.3 F (36.8 C)  TempSrc: Oral  SpO2: 99%  Weight: 164 lb (74.4 kg)  Height: 5\' 8"  (1.727 m)      Assessment & Plan:

## 2016-12-26 NOTE — Assessment & Plan Note (Signed)
Rx for prednisone short burst. She does not meet indication for antibiotics at today's visit. Continue with conservative measures as well for comfort. Declines the need for cough medicine or syrup at today's visit.

## 2017-01-08 ENCOUNTER — Ambulatory Visit: Payer: Medicare Other | Admitting: Internal Medicine

## 2017-01-26 DIAGNOSIS — Z23 Encounter for immunization: Secondary | ICD-10-CM | POA: Diagnosis not present

## 2017-01-30 ENCOUNTER — Telehealth (HOSPITAL_COMMUNITY): Payer: Self-pay | Admitting: *Deleted

## 2017-01-30 ENCOUNTER — Telehealth (HOSPITAL_COMMUNITY): Payer: Self-pay | Admitting: Radiology

## 2017-01-30 NOTE — Telephone Encounter (Signed)
Pt called with new complaints of burning in her tailbone area that radiates around the sides and goes down to her feet. I explained to her that Dr. Estanislado Pandy is out of the office today and that I would pass this information along to a PA who would call her back to discuss. The pt did not want to speak to a PA. I told her that this was policy and that I had to hand this off to a PA. She would like to wait for Deveshwar to return but I told her that she would still be getting a call from a PA to discuss. Put note under PA door 01/30/17 0816 JM

## 2017-01-30 NOTE — Telephone Encounter (Signed)
Brittney Franklin PA-C returned called from patient.  Patient called related to burning in her tailbone with radiation  around her sides and down her legs.  Only relief in lying down, burning can radiate from buttocks to bilateral feet  Will have patient come in to see Dr. Estanislado Pandy.  Scheduler to call patient as soon as possible.  Pt. Agreeable to this plan

## 2017-02-02 ENCOUNTER — Telehealth (HOSPITAL_COMMUNITY): Payer: Self-pay | Admitting: *Deleted

## 2017-02-02 ENCOUNTER — Other Ambulatory Visit: Payer: Self-pay | Admitting: Internal Medicine

## 2017-02-02 NOTE — Telephone Encounter (Signed)
Called and spoke with patient.  Per Dr. Estanislado Pandy patient should be seen by her PCP or Orthopedic to discuss issues.  Pt voiced understanding and stated she will call them

## 2017-02-03 NOTE — Telephone Encounter (Signed)
Faxed to [facility] on [address]

## 2017-02-09 ENCOUNTER — Other Ambulatory Visit: Payer: Self-pay | Admitting: Internal Medicine

## 2017-02-11 DIAGNOSIS — H10013 Acute follicular conjunctivitis, bilateral: Secondary | ICD-10-CM | POA: Diagnosis not present

## 2017-02-20 ENCOUNTER — Other Ambulatory Visit: Payer: Self-pay | Admitting: Internal Medicine

## 2017-02-21 ENCOUNTER — Other Ambulatory Visit: Payer: Self-pay | Admitting: Internal Medicine

## 2017-02-23 ENCOUNTER — Telehealth: Payer: Self-pay | Admitting: Internal Medicine

## 2017-02-23 MED ORDER — ZOLPIDEM TARTRATE 10 MG PO TABS: ORAL_TABLET | ORAL | 3 refills | Status: DC

## 2017-02-23 NOTE — Telephone Encounter (Signed)
Per chart rx was ok & printed on 02/03/17. Pls resend electronically to [facility]...Johny Chess

## 2017-02-23 NOTE — Telephone Encounter (Signed)
zolpidem (AMBIEN) 10 MG tablet   Patient is calling and stating that the pharmacy does not have her RX. Pharmacy advise her to call us. Can we please follow up with her on this. Thank you.

## 2017-02-23 NOTE — Telephone Encounter (Signed)
Sent in

## 2017-02-26 ENCOUNTER — Ambulatory Visit: Payer: Self-pay

## 2017-02-26 NOTE — Telephone Encounter (Signed)
Phone call from pt.  Reported she is taking Lyrica 50 mg bid; reported taking it steadily on 02/05/17.  Reported c/o tingling in her fingers of both hands with left > right, over past 1.5-2.0 weeks. Denied any change in color or temperature of fingers.  Voiced concern that the Lyrica is causing the tingling.  Denied any other neurological symptoms; denied temp. vision loss, weakness in unilateral extremities, speech difficulties, facial droop, dizziness, or balance issues.   Stated she has had a lot of improvement with foot and leg pain, since taking the Lyrica.   Advised will make Dr. Sharlet Salina aware of tingling of fingers. Pt. Agreed.   Answer Assessment - Initial Assessment Questions 1. SYMPTOMS: "Do you have any symptoms?"     Tingling of fingers with left > right, since starting Lyrica 2. SEVERITY: If symptoms are present, ask "Are they mild, moderate or severe?"     Present steadily over past 1.5-2 weeks.  Protocols used: MEDICATION QUESTION CALL-A-AH

## 2017-02-26 NOTE — Telephone Encounter (Signed)
Pt informed of PCP response and stated understanding.  

## 2017-02-26 NOTE — Telephone Encounter (Signed)
Lyrica should not be causing these problems. Can try carpal tunnel brace for her hands at night time.

## 2017-03-05 ENCOUNTER — Other Ambulatory Visit: Payer: Self-pay

## 2017-04-03 ENCOUNTER — Ambulatory Visit (INDEPENDENT_AMBULATORY_CARE_PROVIDER_SITE_OTHER): Payer: Medicare Other

## 2017-04-03 ENCOUNTER — Other Ambulatory Visit: Payer: Self-pay

## 2017-04-03 ENCOUNTER — Telehealth: Payer: Self-pay | Admitting: Internal Medicine

## 2017-04-03 ENCOUNTER — Encounter: Payer: Self-pay | Admitting: Family Medicine

## 2017-04-03 ENCOUNTER — Ambulatory Visit (INDEPENDENT_AMBULATORY_CARE_PROVIDER_SITE_OTHER): Payer: Medicare Other | Admitting: Family Medicine

## 2017-04-03 VITALS — BP 132/78 | HR 78 | Temp 97.9°F | Ht 68.0 in | Wt 163.0 lb

## 2017-04-03 DIAGNOSIS — M25551 Pain in right hip: Secondary | ICD-10-CM | POA: Diagnosis not present

## 2017-04-03 MED ORDER — AMLODIPINE BESY-BENAZEPRIL HCL 5-20 MG PO CAPS: 1.0000 | ORAL_CAPSULE | Freq: Every day | ORAL | 0 refills | Status: DC

## 2017-04-03 MED ORDER — METHYLPREDNISOLONE ACETATE 40 MG/ML IJ SUSP
40.0000 mg | Freq: Once | INTRAMUSCULAR | Status: AC
  Administered 2017-04-03: 40 mg via INTRAMUSCULAR

## 2017-04-03 NOTE — Assessment & Plan Note (Addendum)
The most severe pain is likely related to lumbar radiculopathy or nerve related pain. The groin pain can be related to the hip joint. xrays no demonstrating a dislocation or fracture and surgical hardware appears to be stable.  - IM depo - continue lyrica (patient reported to taking) Reports intolerant to tramadol.  - if pain still significant, could consider right intra articular hip injection.

## 2017-04-03 NOTE — Telephone Encounter (Signed)
Copied from [place] [id number]. Topic: Quick Communication - Rx Refill/Question >> Apr 03, 2017 11:36 AM Yvette Rack wrote: Has the patient contacted their pharmacy? No.   (Agent: If no, request that the patient contact the pharmacy for the refill.)amLODipine-benazepril (LOTREL) 5-20 MG capsule   Preferred Pharmacy (with phone number or street name): [place], [place] [phone] (Phone) [phone] (Fax  Patient is completely out of medicine   Agent: Please be advised that RX refills may take up to 3 business days. We ask that you follow-up with your pharmacy.

## 2017-04-03 NOTE — Telephone Encounter (Signed)
Refill sent for 90 days needs annual visit for further refills

## 2017-04-03 NOTE — Patient Instructions (Signed)
Please try to use please try to use Aspercreme with lidocaine. Follow-up with me next week if you still have pain in the hip

## 2017-04-03 NOTE — Progress Notes (Signed)
Brittney Gates - 84 y.o. female MRN [id number]  Date of birth: 1925/01/29  SUBJECTIVE:  Including CC & ROS.  Chief Complaint  Patient presents with  . Right hip pain    Brittney Gates is a 84 y.o. female that is presenting with right hip pain. She woke up this morning with sharp pain. Described as a stabbing. She statees she felt a "pop" when she tried to sit back in a chair. Painful to ambulate and sitting.  Denies injury or surgeries of her right hip. Prior hip repair done 2013.  Bilateral Sacroplasty done 06/19/16.  Pain is located in posteriorly and radiates down her leg. She does not tolerate gabapentin or tramadol.  Denies any swelling or bruising.  Having to use a rolling walker to ambulate.  She usually uses a cane to ambulate. The pain is severe when she is sitting. There is no new numbness or tingling. Denies any saddle anesthesia or urinary incontinence. The pain started in her groin.   Review of her chart reveals that she was seen on 5/1 with similar symptoms.  She was seen on 2/5 for sacral insufficiency fracture with delayed healing and spondylosis of the lumbar spine  Independent review of the pelvis and right hip x-ray from 1/4 shows stable post surgical changes and no acute fracture.   Review of Systems  Constitutional: Negative for fever.  Respiratory: Negative for cough.   Cardiovascular: Negative for chest pain.  Gastrointestinal: Negative for abdominal pain.  Musculoskeletal: Positive for arthralgias, back pain and gait problem.  Skin: Negative for color change.  Neurological: Negative for speech difficulty.  Hematological: Negative for adenopathy.  Psychiatric/Behavioral: Negative for agitation.    HISTORY: Past Medical, Surgical, Social, and Family History Reviewed & Updated per EMR.   Pertinent Historical Findings include:  Past Medical History:  Diagnosis Date  . Acute pancreatitis   . ANEMIA   . ANXIETY   . Arthritis   . Chronic anemia   . Diverticulosis     . Gastritis   . Hemorrhoids   . Hiatal hernia   . HYPERLIPIDEMIA   . HYPERTENSION   . HYPOTHYROIDISM   . LOW BACK PAIN, CHRONIC   . PAD (peripheral artery disease) ([facility])     Past Surgical History:  Procedure Laterality Date  . ANGIOPLASTY  1995  . APPENDECTOMY    . BACK SURGERY     x's 2  . BREAST SURGERY     Benign  . FEMUR IM NAIL  01/21/2012   Procedure: INTRAMEDULLARY (IM) NAIL FEMORAL;  Surgeon: Newt Minion, [place];  Location: [facility];  Service: Orthopedics;  Laterality: Left;  Intertrochanteric Nail Left Hip  . HEMORRHOID SURGERY    . IR GENERIC HISTORICAL  05/29/2016   IR SACROPLASTY BILATERAL 05/29/2016 [name], MD [facility]  . IR GENERIC HISTORICAL  06/19/2016   IR RADIOLOGIST EVAL & MGMT 06/19/2016 [facility]  . TUBAL LIGATION      Allergies  Allergen Reactions  . Statins Other (See Comments)    Leg weakness    Family History  Problem Relation Age of Onset  . Lung cancer Father   . Gallbladder disease Son   . Gallbladder disease Daughter   . Colon cancer Neg Hx      Social History   Socioeconomic History  . Marital status: Widowed    Spouse name: Not on file  . Number of children: Not on file  . Years of education: Not on file  . Highest  education level: Not on file  Social Needs  . Financial resource strain: Not on file  . Food insecurity - worry: Not on file  . Food insecurity - inability: Not on file  . Transportation needs - medical: Not on file  . Transportation needs - non-medical: Not on file  Occupational History  . Not on file  Tobacco Use  . Smoking status: Never Smoker  . Smokeless tobacco: Never Used  Substance and Sexual Activity  . Alcohol use: No    Alcohol/week: 0.0 oz  . Drug use: No  . Sexual activity: Not on file  Other Topics Concern  . 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  ENT: normal lips, normal nasal mucosa,  Eye: normal EOM, normal conjunctiva and lids CV:  no edema, +2 pedal pulses   Resp: no accessory muscle use, non-labored,  Skin: no rashes, no areas of induration  Neuro: normal tone, normal sensation to touch Psych:  normal insight, alert and oriented MSK:  Right hip:  No swelling  TTP of the greater trochanter  Pain with log role.  Pain with IR and ER  Unable to lift right thigh against gravity Can extend her knee Pain with SLR on right  Ambulating with rolling walker  Neurovascularly intact.       ASSESSMENT & PLAN:   Right hip pain The most severe pain is likely related to lumbar radiculopathy or nerve related pain. The groin pain can be related to the hip joint. xrays no demonstrating a dislocation or fracture and surgical hardware appears to be stable.  - IM depo - continue lyrica (patient reported to taking) Reports intolerant to tramadol.  - if pain still significant, could consider right intra articular hip injection.

## 2017-04-06 ENCOUNTER — Telehealth: Payer: Self-pay | Admitting: *Deleted

## 2017-04-06 NOTE — Telephone Encounter (Signed)
Copied from [place]. Topic: Inquiry >> Apr 03, 2017  5:58 PM Brittney Gates, NT wrote: Reason for [id number] son called and they would like to know  what injection she was given at her visit please advise  >> Apr 06, 2017  8:05 AM Brittney Gates [name] wrote: From 04/03/17 visit with Dr Raeford Razor at [facility].    Spoke with patient's son [name], verbalized understanding mother was given 40 mg Depo IM injection. Patient doing a little better, will notify the office when she would like to proceed with Hip injection if he pain worsens.

## 2017-04-10 ENCOUNTER — Other Ambulatory Visit: Payer: Self-pay | Admitting: Internal Medicine

## 2017-04-10 NOTE — Telephone Encounter (Signed)
Checked control substance Database last refill 03/14/2017 LOV with [facility] 10/20/16

## 2017-04-13 MED ORDER — PREGABALIN 50 MG PO CAPS: 50.0000 mg | ORAL_CAPSULE | Freq: Two times a day (BID) | ORAL | 2 refills | Status: DC

## 2017-04-13 NOTE — Addendum Note (Signed)
Addended by: Pricilla Holm A on: 04/13/2017 10:06 AM   Modules accepted: Orders

## 2017-06-01 DIAGNOSIS — H01111 Allergic dermatitis of right upper eyelid: Secondary | ICD-10-CM | POA: Diagnosis not present

## 2017-06-17 ENCOUNTER — Ambulatory Visit (INDEPENDENT_AMBULATORY_CARE_PROVIDER_SITE_OTHER): Payer: Medicare Other

## 2017-06-17 ENCOUNTER — Encounter (INDEPENDENT_AMBULATORY_CARE_PROVIDER_SITE_OTHER): Payer: Self-pay | Admitting: Orthopedic Surgery

## 2017-06-17 ENCOUNTER — Ambulatory Visit (INDEPENDENT_AMBULATORY_CARE_PROVIDER_SITE_OTHER): Payer: Medicare Other | Admitting: Orthopedic Surgery

## 2017-06-17 VITALS — Ht 68.0 in | Wt 163.0 lb

## 2017-06-17 DIAGNOSIS — M25512 Pain in left shoulder: Secondary | ICD-10-CM

## 2017-06-17 DIAGNOSIS — M542 Cervicalgia: Secondary | ICD-10-CM

## 2017-06-17 DIAGNOSIS — G8929 Other chronic pain: Secondary | ICD-10-CM

## 2017-06-17 MED ORDER — PREDNISONE 10 MG PO TABS: 20.0000 mg | ORAL_TABLET | Freq: Every day | ORAL | 0 refills | Status: DC

## 2017-06-17 NOTE — Progress Notes (Signed)
Office Visit Note   Patient: Brittney Gates           Date of Birth: October 16, 1924           MRN: [id number] Visit Date: 06/17/2017              Requested by: [name], MD [place], [place] [address] PCP: [name], MD  Chief Complaint  Patient presents with  . Neck - Pain  . Left Shoulder - Pain      HPI: Patient is a 84 year old woman who presents complaining of left shoulder pain worse when she sleeps on the left shoulder.  Patient also complains of left-sided neck pain which radiates down to her clavicle radiates down both arms with worse on the left than the right.  Patient states she has numbness in all the fingertips on the left worse than right and states she has decreased grip strength bilaterally.  He has been using Naprosyn.  Assessment & Plan: Visit Diagnoses:  1. Chronic left shoulder pain   2. Cervical spine pain     Plan: We will start on low-dose prednisone 20 mg with breakfast.  She will wean down to 10 mg every morning as her symptoms improve and then wean off to every other day of 10 mg and then stop.  Will reevaluate in 3 weeks.  Discussed that she may need an MRI scan of her cervical spine.  Follow-Up Instructions: Return in about 3 weeks (around 07/08/2017).   Ortho Exam  Patient is alert, oriented, no adenopathy, well-dressed, normal affect, normal respiratory effort. Examination patient has thoracic outlet tenderness worse on the left than the right.  She has decreased range of motion of her cervical spine she has symmetric grip strength in both upper extremities approximately 4/5.  She does have some crepitation with range of motion of the shoulder and elbow on the left side.  She has Heberden and Bouchard's nodes of the PIP and DIP joints of her hands.  Imaging: Xr Cervical Spine 2 Or 3 Views  Result Date: 06/17/2017 2 view radiographs of the cervical spine shows straightening of the cervical lordosis.  Patient has  advance degenerative disc disease with osteophytic bone spurring from C3-C4-C5 C6.  Xr Shoulder Left  Result Date: 06/17/2017 2 view radiographs of the left shoulder shows glenohumeral arthritis with AC arthritis as well.  The lung fields are clear.  No images are attached to the encounter.  Labs: Lab Results  Component Value Date   HGBA1C 6.0 03/29/2010   REPTSTATUS 07/14/2016 FINAL 07/12/2016   CULT  07/12/2016    NO GROWTH Performed at [facility], [place] [address] [place], [place] [address]    [place] 01/21/2012    @LABSALLVALUES (HGBA1)@  Body mass index is 24.78 kg/m.  Orders:  Orders Placed This Encounter  Procedures  . XR Cervical Spine 2 or 3 views  . XR Shoulder Left   Meds ordered this encounter  Medications  . predniSONE (DELTASONE) 10 MG tablet    Sig: Take 2 tablets (20 mg total) by mouth daily with breakfast.    Dispense:  60 tablet    Refill:  0     Procedures: No procedures performed  Clinical Data: No additional findings.  ROS:  All other systems negative, except as noted in the HPI. Review of Systems  Objective: Vital Signs: Ht 5\' 8"  (1.727 m)   Wt 163 lb (73.9 kg)   BMI  24.78 kg/m   Specialty Comments:  No specialty comments available.  PMFS History: Patient Active Problem List   Diagnosis Date Noted  . Right hip pain 04/03/2017  . Rib pain on left side 09/08/2016  . Swelling of foot joint, left 08/08/2016  . Spondylosis of lumbar spine 05/05/2016  . Palliative care by specialist 04/30/2016  . Lumbosacral radiculopathy at L5   . Sacral insufficiency fracture with delayed healing   . Lumbar radicular pain 04/28/2016  . Radicular low back pain 04/28/2016  . Routine general medical examination at a health care facility 11/01/2015  . RUQ pain 10/05/2015  . Viral URI with cough 04/14/2015  . Abnormal CT scan, chest 05/06/2012  . Osteoporosis 04/30/2012  . Hip fracture left 01/20/2012  . PVD (peripheral  vascular disease) ([facility])   . ANEMIA 04/24/2010  . Hypothyroidism 08/14/2007  . Hyperlipidemia 08/14/2007  . ANXIETY 08/14/2007  . Essential hypertension 08/14/2007  . ARTHRITIS 08/14/2007  . Lumbar back pain with radiculopathy affecting right lower extremity 08/14/2007   Past Medical History:  Diagnosis Date  . Acute pancreatitis   . ANEMIA   . ANXIETY   . Arthritis   . Chronic anemia   . Diverticulosis   . Gastritis   . Hemorrhoids   . Hiatal hernia   . HYPERLIPIDEMIA   . HYPERTENSION   . HYPOTHYROIDISM   . LOW BACK PAIN, CHRONIC   . PAD (peripheral artery disease) ([facility])     Family History  Problem Relation Age of Onset  . Lung cancer Father   . Gallbladder disease Son   . Gallbladder disease Daughter   . Colon cancer Neg Hx     Past Surgical History:  Procedure Laterality Date  . ANGIOPLASTY  1995  . APPENDECTOMY    . BACK SURGERY     x's 2  . BREAST SURGERY     Benign  . FEMUR IM NAIL  01/21/2012   Procedure: INTRAMEDULLARY (IM) NAIL FEMORAL;  Surgeon: Newt Minion, [place];  Location: [facility];  Service: Orthopedics;  Laterality: Left;  Intertrochanteric Nail Left Hip  . HEMORRHOID SURGERY    . IR GENERIC HISTORICAL  05/29/2016   IR SACROPLASTY BILATERAL 05/29/2016 [name], MD [facility]  . IR GENERIC HISTORICAL  06/19/2016   IR RADIOLOGIST EVAL & MGMT 06/19/2016 [facility]  . TUBAL LIGATION     Social History   Occupational History  . Not on file  Tobacco Use  . Smoking status: Never Smoker  . Smokeless tobacco: Never Used  Substance and Sexual Activity  . Alcohol use: No    Alcohol/week: 0.0 oz  . Drug use: No  . Sexual activity: Not on file

## 2017-06-29 ENCOUNTER — Other Ambulatory Visit: Payer: Self-pay | Admitting: Internal Medicine

## 2017-07-01 DIAGNOSIS — D229 Melanocytic nevi, unspecified: Secondary | ICD-10-CM | POA: Diagnosis not present

## 2017-07-01 DIAGNOSIS — L821 Other seborrheic keratosis: Secondary | ICD-10-CM | POA: Diagnosis not present

## 2017-07-13 ENCOUNTER — Ambulatory Visit (INDEPENDENT_AMBULATORY_CARE_PROVIDER_SITE_OTHER): Payer: BLUE CROSS/BLUE SHIELD | Admitting: Orthopedic Surgery

## 2017-07-15 ENCOUNTER — Other Ambulatory Visit: Payer: Self-pay | Admitting: Internal Medicine

## 2017-07-16 NOTE — Telephone Encounter (Signed)
Needs visit for refill, 30 day sent.

## 2017-07-16 NOTE — Telephone Encounter (Signed)
[place] Controlled Substance Database checked. Last filled on 06/11/17  Last CPE 11/01/15

## 2017-07-20 ENCOUNTER — Ambulatory Visit (INDEPENDENT_AMBULATORY_CARE_PROVIDER_SITE_OTHER): Payer: Medicare Other | Admitting: Orthopedic Surgery

## 2017-07-23 ENCOUNTER — Encounter: Payer: Self-pay | Admitting: Internal Medicine

## 2017-07-23 ENCOUNTER — Other Ambulatory Visit (INDEPENDENT_AMBULATORY_CARE_PROVIDER_SITE_OTHER): Payer: Medicare Other

## 2017-07-23 ENCOUNTER — Telehealth: Payer: Self-pay | Admitting: Internal Medicine

## 2017-07-23 ENCOUNTER — Ambulatory Visit (INDEPENDENT_AMBULATORY_CARE_PROVIDER_SITE_OTHER): Payer: Medicare Other | Admitting: Internal Medicine

## 2017-07-23 VITALS — BP 124/70 | HR 67 | Temp 98.0°F | Ht 68.0 in | Wt 172.0 lb

## 2017-07-23 DIAGNOSIS — R300 Dysuria: Secondary | ICD-10-CM

## 2017-07-23 DIAGNOSIS — E559 Vitamin D deficiency, unspecified: Secondary | ICD-10-CM

## 2017-07-23 DIAGNOSIS — E538 Deficiency of other specified B group vitamins: Secondary | ICD-10-CM | POA: Diagnosis not present

## 2017-07-23 DIAGNOSIS — E039 Hypothyroidism, unspecified: Secondary | ICD-10-CM

## 2017-07-23 LAB — POCT URINALYSIS DIPSTICK
Bilirubin, UA: NEGATIVE
Glucose, UA: NEGATIVE
Ketones, UA: NEGATIVE
Nitrite, UA: NEGATIVE
Spec Grav, UA: 1.02 (ref 1.010–1.025)
Urobilinogen, UA: 0.2 E.U./dL
pH, UA: 6 (ref 5.0–8.0)

## 2017-07-23 LAB — CBC
HCT: 33.5 % — ABNORMAL LOW (ref 36.0–46.0)
Hemoglobin: 11.3 g/dL — ABNORMAL LOW (ref 12.0–15.0)
MCHC: 33.5 g/dL (ref 30.0–36.0)
MCV: 103.9 fl — ABNORMAL HIGH (ref 78.0–100.0)
Platelets: 205 10*3/uL (ref 150.0–400.0)
RBC: 3.23 Mil/uL — ABNORMAL LOW (ref 3.87–5.11)
RDW: 22.9 % — ABNORMAL HIGH (ref 11.5–15.5)
WBC: 10 10*3/uL (ref 4.0–10.5)

## 2017-07-23 LAB — COMPREHENSIVE METABOLIC PANEL
ALT: 12 U/L (ref 0–35)
AST: 14 U/L (ref 0–37)
Albumin: 4.1 g/dL (ref 3.5–5.2)
Alkaline Phosphatase: 66 U/L (ref 39–117)
BUN: 22 mg/dL (ref 6–23)
CO2: 27 mEq/L (ref 19–32)
Calcium: 9.3 mg/dL (ref 8.4–10.5)
Chloride: 105 mEq/L (ref 96–112)
Creatinine, Ser: 1.05 mg/dL (ref 0.40–1.20)
GFR: 52.23 mL/min — ABNORMAL LOW (ref >60.00)
Glucose, Bld: 104 mg/dL — ABNORMAL HIGH (ref 70–99)
Potassium: 4.2 mEq/L (ref 3.5–5.1)
Sodium: 138 mEq/L (ref 135–145)
Total Bilirubin: 0.4 mg/dL (ref 0.2–1.2)
Total Protein: 7 g/dL (ref 6.0–8.3)

## 2017-07-23 LAB — T4, FREE: Free T4: 1.04 ng/dL (ref 0.60–1.60)

## 2017-07-23 LAB — TSH: TSH: 0.75 u[IU]/mL (ref 0.35–4.50)

## 2017-07-23 LAB — VITAMIN D 25 HYDROXY (VIT D DEFICIENCY, FRACTURES): VITD: 30.89 ng/mL (ref 30.00–100.00)

## 2017-07-23 LAB — VITAMIN B12: Vitamin B-12: 431 pg/mL (ref 211–911)

## 2017-07-23 MED ORDER — SULFAMETHOXAZOLE-TRIMETHOPRIM 800-160 MG PO TABS: 1.0000 | ORAL_TABLET | Freq: Two times a day (BID) | ORAL | 0 refills | Status: DC

## 2017-07-23 NOTE — Patient Instructions (Signed)
You do have a urine infection and we have sent in bactrim for you. Take 1 pill twice a day for 5 days.   Call us back if the symptoms come back or do not get better.

## 2017-07-23 NOTE — Progress Notes (Signed)
   Subjective:    Patient ID: Brittney Gates, female    DOB: Mar 08, 1925, 84 y.o.   MRN: [id number]  HPI The patient is a 84 YO female coming in for possible bladder infection. Started about 1 week ago. She is having burning with urination and urgency. She denies fevers or chills. She denies nausea or vomiting. No confusion or mental status change. Denies back pain. Overall mildly worsening.   Review of Systems  Constitutional: Negative.   Respiratory: Negative.   Cardiovascular: Negative.   Gastrointestinal: Positive for abdominal pain. Negative for abdominal distention, constipation, diarrhea, nausea and vomiting.  Genitourinary: Positive for dysuria, frequency and urgency.  Musculoskeletal: Negative.   Skin: Negative.       Objective:   Physical Exam  Constitutional: She is oriented to person, place, and time. She appears well-developed and well-nourished.  HENT:  Head: Normocephalic and atraumatic.  Eyes: EOM are normal.  Neck: Normal range of motion.  Cardiovascular: Normal rate and regular rhythm.  Pulmonary/Chest: Effort normal and breath sounds normal. No respiratory distress. She has no wheezes. She has no rales.  Abdominal: Soft. Bowel sounds are normal. She exhibits no distension. There is tenderness. There is no rebound.  Neurological: She is alert and oriented to person, place, and time. Coordination normal.  Skin: Skin is warm and dry.   Vitals:   07/23/17 1546  BP: 124/70  Pulse: 67  Temp: 98 F (36.7 C)  TempSrc: Oral  SpO2: 98%  Weight: 172 lb (78 kg)  Height: 5\' 8"  (1.727 m)      Assessment & Plan:

## 2017-07-23 NOTE — Telephone Encounter (Signed)
Copied from [place] [id number]. Topic: Quick Communication - See Telephone Encounter >> Jul 23, 2017  5:51 PM Ivar Drape wrote: CRM for notification. See Telephone encounter for: 07/23/17. Patient's son said the pharmacist advised them that the sulfamethoxazole-trimethoprim (BACTRIM DS,SEPTRA DS) 800-160 MG tablet medication raises the potassium levels of the patient and she is also taking BP meds that does the same thing.  So they want to know if this medication is okay, or is something else going to be called in for the patient.  Please advise.

## 2017-07-24 DIAGNOSIS — R300 Dysuria: Secondary | ICD-10-CM | POA: Insufficient documentation

## 2017-07-24 NOTE — Telephone Encounter (Signed)
Patient was informed.

## 2017-07-24 NOTE — Telephone Encounter (Signed)
Patient states that the bactrim that was prescribed raises the potassium levels and the amlodipine that she is on already does that. Patient is requesting a different ABX to be sent in. Please advise

## 2017-07-24 NOTE — Assessment & Plan Note (Signed)
U/A suggestive of UTI in the office. Rx for bactrim.

## 2017-07-24 NOTE — Telephone Encounter (Signed)
Patient is calling to check on the status of this. States it will not work well with amLODipine-benazepril (LOTREL) 5-20 MG capsule and is requesting something else. Patient is also requesting a call back once something else has been sent in.  [place], [place]  [place] [address]  Phone: [phone] Fax: [phone]

## 2017-07-24 NOTE — Telephone Encounter (Signed)
Medication is okay to take. This is more for long term usage if patients are on it chronically.

## 2017-07-27 ENCOUNTER — Ambulatory Visit (INDEPENDENT_AMBULATORY_CARE_PROVIDER_SITE_OTHER): Payer: Medicare Other | Admitting: Orthopedic Surgery

## 2017-07-29 ENCOUNTER — Other Ambulatory Visit: Payer: Self-pay | Admitting: Internal Medicine

## 2017-07-29 NOTE — Telephone Encounter (Signed)
Control database checked last refill: 06/20/2017  LOV: 07/23/2017

## 2017-08-03 ENCOUNTER — Ambulatory Visit (INDEPENDENT_AMBULATORY_CARE_PROVIDER_SITE_OTHER): Payer: Medicare Other | Admitting: Orthopedic Surgery

## 2017-08-10 ENCOUNTER — Telehealth (INDEPENDENT_AMBULATORY_CARE_PROVIDER_SITE_OTHER): Payer: Self-pay | Admitting: Orthopedic Surgery

## 2017-08-10 NOTE — Telephone Encounter (Signed)
Patient scheduled 08/18/17 with Dr Sharol Given

## 2017-08-10 NOTE — Telephone Encounter (Signed)
Can you please call pt and make appt for eval please?

## 2017-08-10 NOTE — Telephone Encounter (Signed)
Patient called advised she is having problem lifting her left foot and can not walk without her walker. Patient said her right hip is also hurting her. Patient said she can't walk using her cane right now. Patient asked if she can get something for pain. The number to contact patient is [phone]

## 2017-08-13 ENCOUNTER — Other Ambulatory Visit: Payer: Self-pay | Admitting: Internal Medicine

## 2017-08-15 ENCOUNTER — Other Ambulatory Visit: Payer: Self-pay | Admitting: Internal Medicine

## 2017-08-17 NOTE — Telephone Encounter (Signed)
Control database checked last refill: 07/16/2017 LOV: 07/23/2017

## 2017-08-18 ENCOUNTER — Encounter (INDEPENDENT_AMBULATORY_CARE_PROVIDER_SITE_OTHER): Payer: Self-pay | Admitting: Orthopedic Surgery

## 2017-08-18 ENCOUNTER — Ambulatory Visit (INDEPENDENT_AMBULATORY_CARE_PROVIDER_SITE_OTHER): Payer: Medicare Other | Admitting: Orthopedic Surgery

## 2017-08-18 VITALS — Ht 68.0 in | Wt 172.0 lb

## 2017-08-18 DIAGNOSIS — M25512 Pain in left shoulder: Secondary | ICD-10-CM | POA: Diagnosis not present

## 2017-08-18 DIAGNOSIS — M47816 Spondylosis without myelopathy or radiculopathy, lumbar region: Secondary | ICD-10-CM

## 2017-08-18 DIAGNOSIS — G8929 Other chronic pain: Secondary | ICD-10-CM | POA: Diagnosis not present

## 2017-08-18 DIAGNOSIS — M5416 Radiculopathy, lumbar region: Secondary | ICD-10-CM | POA: Diagnosis not present

## 2017-08-18 MED ORDER — PREDNISONE 10 MG PO TABS: 20.0000 mg | ORAL_TABLET | Freq: Every day | ORAL | 0 refills | Status: DC

## 2017-08-18 NOTE — Progress Notes (Signed)
Office Visit Note   Patient: Brittney Gates           Date of Birth: Jan 06, 1925           MRN: [id number] Visit Date: 08/18/2017              Requested by: [name], MD [place], [place] [address] PCP: [name], [place]  Chief Complaint  Patient presents with  . Lower Back - Pain      HPI: Patient is a 84 year old woman who complains of global pain.  She complains of pain at the base of her lumbar spine she states with prolonged sitting she has radicular pain going down her legs she complains of burning pain in her legs with swelling worse at the end of the day she states she does not have swelling in the morning.  She states she has numbness in all of her fingers denies any neck pain or radicular pain from her cervical spine.  Patient states she has difficulty with grip strength.  Past medical history is updated review of systems she has had a sacral plasty last year she is currently on Lyrica 50 mg twice a day.  Assessment & Plan: Visit Diagnoses:  1. Chronic left shoulder pain   2. Lumbar back pain with radiculopathy affecting right lower extremity   3. Spondylosis of lumbar spine     Plan: Recommend we start with a low-dose prednisone 20 mg with breakfast.  Discussed that at follow-up we could evaluate for potentially increasing her Lyrica or changing her over to Neurontin.  Patient was recommended to continue being active with walking minimize sitting.  The importance of wearing knee-high compression stockings was discussed to decrease the venous stasis swelling which is causing numbness and tingling in her legs.  Patient states she understands her son is present.  Follow-Up Instructions: Return in about 1 month (around 09/15/2017).   Ortho Exam  Patient is alert, oriented, no adenopathy, well-dressed, normal affect, normal respiratory effort. Examination patient ambulates with a walker she has difficulty getting from a sitting to a standing  position.  She has an antalgic gait.  She has no pain with range of motion of the hip knee or ankle there is a negative straight leg raise bilaterally.  Patient has symmetric strength in both lower extremities but no focal motor weakness.  She has Heberden's and Bouchard's nodes in her fingers and has symmetric weakness and both hands with grip strength.  Imaging: No results found. No images are attached to the encounter.  Labs: Lab Results  Component Value Date   HGBA1C 6.0 03/29/2010   REPTSTATUS 07/14/2016 FINAL 07/12/2016   CULT  07/12/2016    NO GROWTH Performed at [facility], [place] [address] [place], [place] [address]    [place] 01/21/2012     Lab Results  Component Value Date   ALBUMIN 4.1 07/23/2017   ALBUMIN 4.2 10/05/2015   ALBUMIN 4.4 10/30/2014    Body mass index is 26.15 kg/m.  Orders:  No orders of the defined types were placed in this encounter.  Meds ordered this encounter  Medications  . predniSONE (DELTASONE) 10 MG tablet    Sig: Take 2 tablets (20 mg total) by mouth daily with breakfast.    Dispense:  60 tablet    Refill:  0     Procedures: No procedures performed  Clinical Data: No additional findings.  ROS:  All other systems  negative, except as noted in the HPI. Review of Systems  Objective: Vital Signs: Ht 5\' 8"  (1.727 m)   Wt 172 lb (78 kg)   BMI 26.15 kg/m   Specialty Comments:  No specialty comments available.  PMFS History: Patient Active Problem List   Diagnosis Date Noted  . Dysuria 07/24/2017  . Right hip pain 04/03/2017  . Rib pain on left side 09/08/2016  . Swelling of foot joint, left 08/08/2016  . Spondylosis of lumbar spine 05/05/2016  . Palliative care by specialist 04/30/2016  . Lumbosacral radiculopathy at L5   . Sacral insufficiency fracture with delayed healing   . Lumbar radicular pain 04/28/2016  . Radicular low back pain 04/28/2016  . Routine general medical examination at a  health care facility 11/01/2015  . RUQ pain 10/05/2015  . Viral URI with cough 04/14/2015  . Abnormal CT scan, chest 05/06/2012  . Osteoporosis 04/30/2012  . Hip fracture left 01/20/2012  . PVD (peripheral vascular disease) ([facility])   . ANEMIA 04/24/2010  . Hypothyroidism 08/14/2007  . Hyperlipidemia 08/14/2007  . ANXIETY 08/14/2007  . Essential hypertension 08/14/2007  . ARTHRITIS 08/14/2007  . Lumbar back pain with radiculopathy affecting right lower extremity 08/14/2007   Past Medical History:  Diagnosis Date  . Acute pancreatitis   . ANEMIA   . ANXIETY   . Arthritis   . Chronic anemia   . Diverticulosis   . Gastritis   . Hemorrhoids   . Hiatal hernia   . HYPERLIPIDEMIA   . HYPERTENSION   . HYPOTHYROIDISM   . LOW BACK PAIN, CHRONIC   . PAD (peripheral artery disease) ([facility])     Family History  Problem Relation Age of Onset  . Lung cancer Father   . Gallbladder disease Son   . Gallbladder disease Daughter   . Colon cancer Neg Hx     Past Surgical History:  Procedure Laterality Date  . ANGIOPLASTY  1995  . APPENDECTOMY    . BACK SURGERY     x's 2  . BREAST SURGERY     Benign  . FEMUR IM NAIL  01/21/2012   Procedure: INTRAMEDULLARY (IM) NAIL FEMORAL;  Surgeon: Newt Minion, [place];  Location: [facility];  Service: Orthopedics;  Laterality: Left;  Intertrochanteric Nail Left Hip  . HEMORRHOID SURGERY    . IR GENERIC HISTORICAL  05/29/2016   IR SACROPLASTY BILATERAL 05/29/2016 [name], MD [facility]  . IR GENERIC HISTORICAL  06/19/2016   IR RADIOLOGIST EVAL & MGMT 06/19/2016 [facility]  . TUBAL LIGATION     Social History   Occupational History  . Not on file  Tobacco Use  . Smoking status: Never Smoker  . Smokeless tobacco: Never Used  Substance and Sexual Activity  . Alcohol use: No    Alcohol/week: 0.0 oz  . Drug use: No  . Sexual activity: Not on file

## 2017-08-31 ENCOUNTER — Telehealth: Payer: Self-pay | Admitting: Emergency Medicine

## 2017-08-31 ENCOUNTER — Encounter: Payer: Self-pay | Admitting: Cardiovascular Disease

## 2017-08-31 ENCOUNTER — Ambulatory Visit (INDEPENDENT_AMBULATORY_CARE_PROVIDER_SITE_OTHER): Payer: Medicare Other | Admitting: Cardiovascular Disease

## 2017-08-31 VITALS — BP 140/70 | HR 73 | Ht 68.0 in | Wt 174.8 lb

## 2017-08-31 DIAGNOSIS — R0602 Shortness of breath: Secondary | ICD-10-CM | POA: Diagnosis not present

## 2017-08-31 DIAGNOSIS — R9431 Abnormal electrocardiogram [ECG] [EKG]: Secondary | ICD-10-CM | POA: Diagnosis not present

## 2017-08-31 DIAGNOSIS — R079 Chest pain, unspecified: Secondary | ICD-10-CM | POA: Diagnosis not present

## 2017-08-31 NOTE — Progress Notes (Signed)
Cardiology Office Note Date:  08/31/2017   ID:  GARYN WAGUESPACK, DOB 08/07/1924, MRN [id number]  PCP:  [name], MD  Cardiologist:  Sherren Mocha, MD    Chief Complaint  Patient presents with  . Chest Pain     History of Present Illness: Brittney Gates is a 84 y.o. female who presents for follow-up of chest pain.  The patient is here with her son today.  She reports episodes where she suddenly feels extremely fatigued and describes a discomfort in her anterior chest associated with shortness of breath and weakness.  She also gets a feeling of flushing in her head when this occurs.  She denies exertional symptoms but she is not too active anymore.  She does get out and walk around and target without any specific symptoms.  Her symptoms of chest discomfort have worsened some over the last year.  There is no radiation to the arms or neck.   Past Medical History:  Diagnosis Date  . Acute pancreatitis   . ANEMIA   . ANXIETY   . Arthritis   . Chronic anemia   . Diverticulosis   . Gastritis   . Hemorrhoids   . Hiatal hernia   . HYPERLIPIDEMIA   . HYPERTENSION   . HYPOTHYROIDISM   . LOW BACK PAIN, CHRONIC   . PAD (peripheral artery disease) ([place])     Past Surgical History:  Procedure Laterality Date  . ANGIOPLASTY  1995  . APPENDECTOMY    . BACK SURGERY     x's 2  . BREAST SURGERY     Benign  . FEMUR IM NAIL  01/21/2012   Procedure: INTRAMEDULLARY (IM) NAIL FEMORAL;  Surgeon: Newt Minion, MD;  Location: [facility];  Service: Orthopedics;  Laterality: Left;  Intertrochanteric Nail Left Hip  . HEMORRHOID SURGERY    . IR GENERIC HISTORICAL  05/29/2016   IR SACROPLASTY BILATERAL 05/29/2016 [name], MD [facility]  . IR GENERIC HISTORICAL  06/19/2016   IR RADIOLOGIST EVAL & MGMT 06/19/2016 [facility]  . TUBAL LIGATION      Current Outpatient Medications  Medication Sig Dispense Refill  . amLODipine-benazepril (LOTREL) 5-20 MG capsule Take 1 capsule by  mouth daily. 90 capsule 1  . aspirin EC 81 MG tablet Take 81 mg by mouth daily.    . Cholecalciferol 2000 UNITS TABS Take 2,000 Units by mouth daily.    Marland Kitchen levothyroxine (SYNTHROID, LEVOTHROID) 100 MCG tablet Take 1 tablet (100 mcg total) by mouth daily before breakfast. 90 tablet 3  . LYRICA 50 MG capsule TAKE ONE CAPSULE BY MOUTH TWICE A DAY 60 capsule 5  . Multiple Vitamin (MULTIVITAMIN WITH MINERALS) TABS tablet Take 1 tablet by mouth daily.    . predniSONE (DELTASONE) 10 MG tablet Take 2 tablets (20 mg total) by mouth daily with breakfast. 60 tablet 0  . sennosides-docusate sodium (SENOKOT-S) 8.6-50 MG tablet Take 2 tablets by mouth 2 (two) times daily as needed for constipation.     . sulfamethoxazole-trimethoprim (BACTRIM DS,SEPTRA DS) 800-160 MG tablet Take 1 tablet by mouth 2 (two) times daily. 10 tablet 0  . zolpidem (AMBIEN) 10 MG tablet TAKE ONE TABLET BY MOUTH EVERY NIGHT AT BEDTIME AS NEEDED FOR SLEEP 90 tablet 1   No current facility-administered medications for this visit.     Allergies:   Statins   Social History:  The patient  reports that she has never smoked. She has never used smokeless tobacco. She reports  that she does not drink alcohol or use drugs.   Family History:  The patient's family history includes Gallbladder disease in her daughter and son; Lung cancer in her father.    ROS:  Please see the history of present illness.  Otherwise, review of systems is positive for muscle pain, fatigue, leg pain.  All other systems are reviewed and negative.    PHYSICAL EXAM: VS:  BP 140/70   Pulse 73   Ht 5\' 8"  (1.727 m)   Wt 174 lb 12.8 oz (79.3 kg)   SpO2 96%   BMI 26.58 kg/m  , BMI Body mass index is 26.58 kg/m. GEN: Well nourished, well developed, in no acute distress  HEENT: normal  Neck: no JVD, no masses. No carotid bruits Cardiac: RRR with 2/6 systolic murmur at the RUSB               Respiratory:  clear to auscultation bilaterally, normal work of  breathing GI: soft, nontender, nondistended, + BS MS: no deformity or atrophy  Ext: 1+ pretibial edema, pedal pulses 2+= bilaterally Skin: warm and dry, no rash Neuro:  Strength and sensation are intact Psych: euthymic mood, full affect  EKG:  EKG is ordered today. The ekg ordered today shows normal sinus rhythm with ST and T wave abnormality consider inferolateral ischemia  Recent Labs: 07/23/2017: ALT 12; BUN 22; Creatinine, Ser 1.05; Hemoglobin 11.3; Platelets 205.0; Potassium 4.2; Sodium 138; TSH 0.75   Lipid Panel     Component Value Date/Time   CHOL 196 10/30/2014 1208   TRIG 147.0 10/30/2014 1208   TRIG 120 03/29/2010   HDL 51.00 10/30/2014 1208   CHOLHDL 4 10/30/2014 1208   VLDL 29.4 10/30/2014 1208   LDLCALC 116 (H) 10/30/2014 1208   LDLDIRECT 143.1 12/08/2012 1223      Wt Readings from Last 3 Encounters:  08/31/17 174 lb 12.8 oz (79.3 kg)  08/18/17 172 lb (78 kg)  07/23/17 172 lb (78 kg)     Cardiac Studies Reviewed: Echo 07-26-2015: Study Conclusions  - Left ventricle: The cavity size was normal. Wall thickness was   normal. Systolic function was normal. The estimated ejection   fraction was in the range of 50% to 55%. Wall motion was normal;   there were no regional wall motion abnormalities. Doppler   parameters are consistent with abnormal left ventricular   relaxation (grade 1 diastolic dysfunction). - Aortic valve: Trileaflet; mildly thickened, mildly calcified   leaflets. Transvalvular velocity was minimally increased. There   was no stenosis. Peak velocity (S): 216 cm/s. - Aorta: Ascending aortic diameter: 39 mm (S). - Ascending aorta: The ascending aorta was mildly dilated. - Mitral valve: Calcified annulus. There was trivial regurgitation. - Left atrium: The atrium was mildly dilated.  ASSESSMENT AND PLAN: 1.  Chest pain with typical and atypical features: Is concerning that her EKG has more pronounced ST/T wave changes compared to previous  tracings.  I have recommended a repeat Lexiscan Myoview stress test for risk stratification.  Her last stress test was over 5 years ago.  No medicine changes are made today.  2.  Edema/shortness of breath: We will update an echocardiogram.  Her last echo from 2 years ago was reviewed.  No obvious signs of volume overload other than leg swelling on exam today.  3.  Hypertension: Blood pressure appears to be well controlled on benazepril, amlodipine.  Current medicines are reviewed with the patient today.  The patient does not have concerns regarding medicines.  Labs/ tests ordered today include:   Orders Placed This Encounter  Procedures  . MYOCARDIAL PERFUSION IMAGING  . EKG 12-Lead  . ECHOCARDIOGRAM COMPLETE    Disposition:   FU one year pending test results  Signed, Sherren Mocha, MD  08/31/2017 5:24 PM    [facility] [place], [place], [place]  [address] Phone: [phone]; Fax: [phone]

## 2017-08-31 NOTE — Patient Instructions (Signed)
Medication Instructions:  Your provider recommends that you continue on your current medications as directed. Please refer to the Current Medication list given to you today.    Labwork: None  Testing/Procedures: Your provider has requested that you have an echocardiogram. Echocardiography is a painless test that uses sound waves to create images of your heart. It provides your doctor with information about the size and shape of your heart and how well your heart's chambers and valves are working. This procedure takes approximately one hour. There are no restrictions for this procedure.   Your provider has requested that you have a lexiscan myoview. For further information please visit [email or web address]. Please follow instruction sheet, as given.  Follow-Up: Your provider wants you to follow-up in: 1 year with Dr. Burt Knack. You will receive a reminder letter in the mail two months in advance. If you don't receive a letter, please call our office to schedule the follow-up appointment.    Any Other Special Instructions Will Be Listed Below (If Applicable).     If you need a refill on your cardiac medications before your next appointment, please call your pharmacy.

## 2017-08-31 NOTE — Telephone Encounter (Signed)
Called patient to schedule AWV. Patient will call back later to schedule.

## 2017-09-07 ENCOUNTER — Telehealth (HOSPITAL_COMMUNITY): Payer: Self-pay | Admitting: *Deleted

## 2017-09-07 NOTE — Telephone Encounter (Signed)
Patient's son [name] per [facility] given detailed instructions per Myocardial Perfusion Study Information Sheet for the test on 09/10/17 at 0945. Patient notified to arrive 15 minutes early and that it is imperative to arrive on time for appointment to keep from having the test rescheduled.  If you need to cancel or reschedule your appointment, please call the office within 24 hours of your appointment. . Patient verbalized understanding.Anjolina Byrer, Ranae Palms

## 2017-09-10 ENCOUNTER — Ambulatory Visit (HOSPITAL_COMMUNITY): Payer: Medicare Other | Attending: Cardiology

## 2017-09-10 ENCOUNTER — Ambulatory Visit (HOSPITAL_BASED_OUTPATIENT_CLINIC_OR_DEPARTMENT_OTHER): Payer: Medicare Other

## 2017-09-10 ENCOUNTER — Other Ambulatory Visit: Payer: Self-pay

## 2017-09-10 DIAGNOSIS — I350 Nonrheumatic aortic (valve) stenosis: Secondary | ICD-10-CM | POA: Diagnosis not present

## 2017-09-10 DIAGNOSIS — R9431 Abnormal electrocardiogram [ECG] [EKG]: Secondary | ICD-10-CM | POA: Diagnosis not present

## 2017-09-10 DIAGNOSIS — R079 Chest pain, unspecified: Secondary | ICD-10-CM

## 2017-09-10 DIAGNOSIS — R0602 Shortness of breath: Secondary | ICD-10-CM | POA: Insufficient documentation

## 2017-09-10 DIAGNOSIS — I119 Hypertensive heart disease without heart failure: Secondary | ICD-10-CM | POA: Insufficient documentation

## 2017-09-10 DIAGNOSIS — E785 Hyperlipidemia, unspecified: Secondary | ICD-10-CM | POA: Insufficient documentation

## 2017-09-10 LAB — MYOCARDIAL PERFUSION IMAGING
LV dias vol: 94 mL (ref 46–106)
LV sys vol: 52 mL
Peak HR: 83 {beats}/min
RATE: 0.44
Rest HR: 64 {beats}/min
SDS: 2
SRS: 7
SSS: 9
TID: 0.96

## 2017-09-10 MED ORDER — TECHNETIUM TC 99M TETROFOSMIN IV KIT
31.4000 | PACK | Freq: Once | INTRAVENOUS | Status: AC | PRN
  Administered 2017-09-10: 31.4 via INTRAVENOUS
  Filled 2017-09-10: qty 32

## 2017-09-10 MED ORDER — REGADENOSON 0.4 MG/5ML IV SOLN
0.4000 mg | Freq: Once | INTRAVENOUS | Status: AC
  Administered 2017-09-10: 0.4 mg via INTRAVENOUS

## 2017-09-10 MED ORDER — TECHNETIUM TC 99M TETROFOSMIN IV KIT
10.8000 | PACK | Freq: Once | INTRAVENOUS | Status: AC | PRN
  Administered 2017-09-10: 10.8 via INTRAVENOUS
  Filled 2017-09-10: qty 11

## 2017-09-15 ENCOUNTER — Telehealth: Payer: Self-pay | Admitting: Cardiovascular Disease

## 2017-09-15 NOTE — Telephone Encounter (Signed)
Informed patient's son that they will be called when Dr. Burt Knack reviews results and sends to Nursing. He was grateful for call.

## 2017-09-15 NOTE — Telephone Encounter (Signed)
New Message:      Pt's son is calling to get the results from pt's echo and stress test done on last week.

## 2017-09-24 ENCOUNTER — Ambulatory Visit (INDEPENDENT_AMBULATORY_CARE_PROVIDER_SITE_OTHER): Payer: Medicare Other | Admitting: Orthopedic Surgery

## 2017-10-06 ENCOUNTER — Encounter: Payer: Self-pay | Admitting: Podiatry

## 2017-10-06 ENCOUNTER — Ambulatory Visit (INDEPENDENT_AMBULATORY_CARE_PROVIDER_SITE_OTHER): Payer: Medicare Other | Admitting: Podiatry

## 2017-10-06 VITALS — BP 144/73 | HR 66 | Resp 16

## 2017-10-06 DIAGNOSIS — L03032 Cellulitis of left toe: Secondary | ICD-10-CM

## 2017-10-06 DIAGNOSIS — M722 Plantar fascial fibromatosis: Secondary | ICD-10-CM

## 2017-10-06 DIAGNOSIS — I999 Unspecified disorder of circulatory system: Secondary | ICD-10-CM

## 2017-10-06 NOTE — Patient Instructions (Signed)

## 2017-10-06 NOTE — Progress Notes (Signed)
   Subjective:    Patient ID: Brittney Gates, female    DOB: 1924/07/24, 84 y.o.   MRN: [id number]  HPI    Review of Systems  All other systems reviewed and are negative.      Objective:   Physical Exam        Assessment & Plan:

## 2017-10-07 NOTE — Progress Notes (Signed)
Subjective:   Patient ID: Brittney Gates, female   DOB: 84 y.o.   MRN: [id number]   HPI Patient presents stating that she has a lot of pain on the inside of her left big toe with a damaged toenail  Makes it hard for her to wear shoe gear comfortably understood some redness around it that is localized.  Patient also complains of discomfort in the right bottom of the heel at times that is sporadic   Review of Systems  All other systems reviewed and are negative.       Objective:  Physical Exam  Constitutional: She appears well-developed and well-nourished.  Cardiovascular: Intact distal pulses.  Pulmonary/Chest: Effort normal.  Musculoskeletal: Normal range of motion.  Neurological: She is alert.  Skin: Skin is warm.  Nursing note and vitals reviewed.   Vascular status was found to be diminished with diminished PT and DP pulses with patient found to have incurvation of the left hallux lateral border that ingrowing into the corner with slight distal redness and irritation of the tissue.  Patient does have good digital perfusion and mild discomfort plantar aspect right heel     Assessment:  Patient who probably has some vascular compromise with paronychia localized infection with ingrown component left hallux lateral border     Plan:  H&P condition reviewed and due to her circulation I do not recommend permanent procedure but I did go ahead using sterile technique I infiltrated 60 mg like Marcaine mixture sterile prep applied to the toe and using sterile instrumentation I carefully remove the lateral corner necrotic tissue and allow channel for drainage and instructed on soaks.  I explained the chances for not healing associated with her circulatory status and they will soak and keep a Band-Aid on this and will be seen back to recheck.  I do not recommend current treatment for the right heel

## 2017-10-08 NOTE — Telephone Encounter (Signed)
Signing past encounter of attempted phone call 

## 2017-10-15 ENCOUNTER — Ambulatory Visit: Payer: Medicare Other | Admitting: Podiatry

## 2017-10-15 DIAGNOSIS — H6123 Impacted cerumen, bilateral: Secondary | ICD-10-CM | POA: Diagnosis not present

## 2017-10-15 DIAGNOSIS — H903 Sensorineural hearing loss, bilateral: Secondary | ICD-10-CM | POA: Diagnosis not present

## 2017-10-15 DIAGNOSIS — R490 Dysphonia: Secondary | ICD-10-CM | POA: Diagnosis not present

## 2017-11-06 ENCOUNTER — Ambulatory Visit: Payer: Self-pay | Admitting: Internal Medicine

## 2017-11-06 NOTE — Telephone Encounter (Signed)
She called c/o having swelling in both legs with burning in left foot that has been present "about 4 months".   "I think it started when I started taking Lyrica".   She also said her fingertips feel numb since the Lyrica.   She said,  "Dr. Sharlet Salina did not think the Lyrica was causing all these problems".    "I saw my heart doctor and he said my heart was fine".    She was difficult to triage due to her jumping from one issue to another.   I had to redirect her to get her to finish telling me about the current issue.    She would start telling me about things that happened years ago that did not pertain to her leg swelling and feet issues.  She has transportation issues.   She can only come to the office next Wednesday (8/14) or Friday (8/16).    The protocol is to be seen within 24 hours.     I scheduled her with Dr. Sharlet Salina for 11/11/17 at 10:20.    She is going to check with her son and see if this works for him.   If not she will call us back and reschedule.  I spoke with [place], the flow coordinator and made her aware of the situation just so Dr. Sharlet Salina would be aware.   Wednesday 8/14 was as soon as I could schedule her due to her conflicts and reasons plus transportation issues. I routed my triage notes to Dr. Sharlet Salina.   Reason for Disposition . [1] MODERATE leg swelling (e.g., swelling extends up to knees) AND [2] new onset or worsening  Answer Assessment - Initial Assessment Questions 1. ONSET: "When did the swelling start?" (e.g., minutes, hours, days)     I have a burning sensation in my leg leg.   I was started on Lyrica it made my fingertips numb and tingling.   Dr. Sharlet Salina said it was not the Lyrica causes it.   The last 3-4 months my feet and ankles are swelling when I wake up in the morning.   In the mornings they are normal but after I take the Lyrica by the evening I have the heavy swelling in both of my legs with burning.   The swelling is 3-4 inches below my knee.   My feet  and ankles are swelling. 2. LOCATION: "What part of the leg is swollen?"  "Are both legs swollen or just one leg?"     See above. 3. SEVERITY: "How bad is the swelling?" (e.g., localized; mild, moderate, severe)  - Localized - small area of swelling localized to one leg  - MILD pedal edema - swelling limited to foot and ankle, pitting edema < 1/4 inch (6 mm) deep, rest and elevation eliminate most or all swelling  - MODERATE edema - swelling of lower leg to knee, pitting edema > 1/4 inch (6 mm) deep, rest and elevation only partially reduce swelling  - SEVERE edema - swelling extends above knee, facial or hand swelling present      I can't see my ankle bones very well when they are swollen.   I get a terrible pain on the rim of my heels.   They look normal.   My right heel hurts worse.  I think the Lyrica is causing this.  My fingers feel like they have something in between them and there is not anything there.   They also have numbness in my fingertips.  My left hand is worse.   Sometimes my whole left hand will hurt.   My knuckles feel tight in both hands.   My feet tight. 4. REDNESS: "Does the swelling look red or infected?"     Denies redness.      I broke my hip in 2013. 5. PAIN: "Is the swelling painful to touch?" If so, ask: "How painful is it?"   (Scale 1-10; mild, moderate or severe)     Burning in my feet and left leg.  A little burning in my right leg too.    The heart doctor checked my heart and it was fine.     I had my sacrum fixed with screws 3 yrs ago. 6. FEVER: "Do you have a fever?" If so, ask: "What is it, how was it measured, and when did it start?"   No fever.      7. CAUSE: "What do you think is causing the leg swelling?"     I think it's the lyrica. 8. MEDICAL HISTORY: "Do you have a history of heart failure, kidney disease, liver failure, or cancer?"     Cardiologist said my heart was fine. 9. RECURRENT SYMPTOM: "Have you had leg swelling before?" If so, ask: "When was the  last time?" "What happened that time?"     Not had swelling before but just the burning pain but not the swelling. 10. OTHER SYMPTOMS: "Do you have any other symptoms?" (e.g., chest pain, difficulty breathing)       No 11. PREGNANCY: "Is there any chance you are pregnant?" "When was your last menstrual period?"       Not asked due to age  Protocols used: LEG SWELLING AND EDEMA-A-AH

## 2017-11-06 NOTE — Telephone Encounter (Signed)
FYI

## 2017-11-11 ENCOUNTER — Ambulatory Visit: Payer: Medicare Other | Admitting: Internal Medicine

## 2017-11-20 ENCOUNTER — Ambulatory Visit (INDEPENDENT_AMBULATORY_CARE_PROVIDER_SITE_OTHER): Payer: Medicare Other | Admitting: Internal Medicine

## 2017-11-20 ENCOUNTER — Encounter: Payer: Self-pay | Admitting: Internal Medicine

## 2017-11-20 DIAGNOSIS — M25471 Effusion, right ankle: Secondary | ICD-10-CM

## 2017-11-20 DIAGNOSIS — R635 Abnormal weight gain: Secondary | ICD-10-CM | POA: Diagnosis not present

## 2017-11-20 DIAGNOSIS — M5416 Radiculopathy, lumbar region: Secondary | ICD-10-CM

## 2017-11-20 MED ORDER — METHOCARBAMOL 500 MG PO TABS: 500.0000 mg | ORAL_TABLET | Freq: Every evening | ORAL | 1 refills | Status: DC | PRN

## 2017-11-20 MED ORDER — LIDOCAINE 5 % EX PTCH: 1.0000 | MEDICATED_PATCH | CUTANEOUS | 6 refills | Status: DC

## 2017-11-20 NOTE — Assessment & Plan Note (Signed)
Now worse in right more than left. Chronic swelling in the left ankle. She feels lyrica is related. Would like to stop so we will. Could also be related to amlodipine although she does not want change on that today.

## 2017-11-20 NOTE — Assessment & Plan Note (Signed)
I suspect that multiple courses of prednisone in the last 6 months is more related than lyrica as weight on lyrica fairly stable until course of prednisone in March 2019 and again in May 2019. We will stop prednisone per patient preference. Thyroid levels have been checked during weight gain and normal.

## 2017-11-20 NOTE — Patient Instructions (Signed)
We have sent in the lidocaine patches to use on the low back.   It is okay to stop the lyrica.   We have sent in robaxin to use at night time for restless legs.

## 2017-11-20 NOTE — Assessment & Plan Note (Signed)
Rx for methocarbamol and lidoderm patches for pain. Stop lyrica as she feels she is having side effects.

## 2017-11-20 NOTE — Progress Notes (Signed)
   Subjective:    Patient ID: Brittney Gates, female    DOB: 1924-09-03, 84 y.o.   MRN: [id number]  HPI The patient is a 84 YO female coming in for right ankle swelling (started about 4 months ago, small in the morning and worsens through the day, she feels associated with lyrica medication and feels that stopping it for a few days would help) and weight gain (feels that lyrica is causing her to gain weight, up about 10 pounds in the last year although her perception is up about 20 pounds), and her back pain (she is using lyrica currently and is not sure if she needs this still, would like alternative options to take for pain, denies new numbness or weakness).   Review of Systems  Constitutional: Positive for activity change and unexpected weight change.  HENT: Negative.   Eyes: Negative.   Respiratory: Negative for cough, chest tightness and shortness of breath.   Cardiovascular: Positive for leg swelling. Negative for chest pain and palpitations.  Gastrointestinal: Negative for abdominal distention, abdominal pain, constipation, diarrhea, nausea and vomiting.  Musculoskeletal: Positive for back pain and myalgias.  Skin: Negative.   Neurological: Positive for numbness.  Psychiatric/Behavioral: Negative.       Objective:   Physical Exam  Constitutional: She is oriented to person, place, and time. She appears well-developed and well-nourished.  HENT:  Head: Normocephalic and atraumatic.  Eyes: EOM are normal.  Neck: Normal range of motion.  Cardiovascular: Normal rate and regular rhythm.  Pulmonary/Chest: Effort normal and breath sounds normal. No respiratory distress. She has no wheezes. She has no rales.  Abdominal: Soft. Bowel sounds are normal. She exhibits no distension. There is no tenderness. There is no rebound.  Musculoskeletal: She exhibits edema.  1-2+ edema to midshin bilaterally, right worse than left  Neurological: She is alert and oriented to person, place, and time.  Coordination abnormal.  walker  Skin: Skin is warm and dry.  Psychiatric: She has a normal mood and affect.   Vitals:   11/20/17 1321  BP: 124/60  Pulse: 69  Temp: 97.9 F (36.6 C)  TempSrc: Oral  SpO2: 96%  Weight: 177 lb (80.3 kg)  Height: 5\' 8"  (1.727 m)      Assessment & Plan:

## 2017-11-25 ENCOUNTER — Telehealth: Payer: Self-pay | Admitting: Internal Medicine

## 2017-11-25 NOTE — Telephone Encounter (Signed)
noted 

## 2017-11-25 NOTE — Telephone Encounter (Signed)
Copied from [place] [id number]. Topic: Quick Communication - See Telephone Encounter >> Nov 25, 2017  8:22 AM Mylinda Latina, NT wrote: CRM for notification. See Telephone encounter for: 11/25/17. Scott calling from [employer] is wanting to make the provider know the patient lidocaine (LIDODERM) 5 %  RX was denied. They are currently denying the coverage because it is not approved by the FDA guidelines. It has something to do with the diagnoses code and the supporting Clinical documentation that was submitted via PA .  They will be notifying that patient . If the provider has any question they can call the provider line CB#  [phone]

## 2017-12-01 DIAGNOSIS — Z23 Encounter for immunization: Secondary | ICD-10-CM | POA: Diagnosis not present

## 2017-12-02 ENCOUNTER — Ambulatory Visit: Payer: Medicare Other | Admitting: Cardiovascular Disease

## 2017-12-25 ENCOUNTER — Ambulatory Visit: Payer: Self-pay | Admitting: Internal Medicine

## 2017-12-25 NOTE — Telephone Encounter (Signed)
Son, Brittney Gates called in and then put her on the phone right from the beginning of the conversation.    See triage notes below as I typed in exactly what she told me happened.   She is alert and oriented with appropriate and clear speech.   Answered my questions without difficulty.   Just c/o having a "stinging inside her head".   It's not so bad today but "I just know something is just not right with my head".  She has transportation issues.   The first available time she could get into the office was Wednesday Oct. 2.   I scheduled her with Dr. Quay Burow for Oct. 2, 2019 at 1:45 at her request.  Her PCP Dr. Sharlet Salina was booked.  The protocol is to be seen within 3 days however due to their transportation issues Wednesday was the first day that would work for her.    I instructed John and pt in importance of going to the ED for any change in her condition and went over the s/s to look for.   John acknowledged understanding of these instructions as he got back on the phone to write down the appt time.   "I will get her to the ER right away if anything changes."    Reason for Disposition . [1] After 72 hours AND [2] headache persists  Answer Assessment - Initial Assessment Questions 1. MECHANISM: "How did the injury happen?" For falls, ask: "What height did you fall from?" and "What surface did you fall against?"      John the son called in regarding her injury.   He put her on the phone. I went to the store with Brittney Gates.   I go over to a bench I sit on while he checks out.    This lady comes over and starts measuring behind me.   She was putting up a metal measuring thing a couple of feet above me.   The guy on the other end let go.   It was a large wall.   It snaps back and hits me on the top of my skull.   I felt a heavy sting.   It lasted a half hour.   I felt my head inside stinging.   Ever since then I've tried to dismiss it.    It went across my scalp maybe 3 inches.   It just stings inside my head.    My  head just doesn't feel right since then.   No cuts or bruise you could see.   But I was having a terrible heavy stinging in my head.    2. ONSET: "When did the injury happen?" (Minutes or hours ago)      September 12th.  3. NEUROLOGIC SYMPTOMS: "Was there any loss of consciousness?" "Are there any other neurological symptoms?"      It's like a mild headache/discomfort.   No loss of consciousness. 4. MENTAL STATUS: "Does the person know who he is, who you are, and where he is?"      Speech is clear and appropriate. 5. LOCATION: "What part of the head was hit?"      Top of skull.   6. SCALP APPEARANCE: "What does the scalp look like? Is it bleeding now?" If so, ask: "Is it difficult to stop?"      There is nothing visible on the outside.   It just still really stings inside. 7. SIZE: For cuts, bruises, or swelling, ask: "How large  is it?" (e.g., inches or centimeters)      None 8. PAIN: "Is there any pain?" If so, ask: "How bad is it?"  (e.g., Scale 1-10; or mild, moderate, severe)     Stinging inside my head. 9. TETANUS: For any breaks in the skin, ask: "When was the last tetanus booster?"     No breaks in skin 10. OTHER SYMPTOMS: "Do you have any other symptoms?" (e.g., neck pain, vomiting)       No other symptoms related to this injury. 11. PREGNANCY: "Is there any chance you are pregnant?" "When was your last menstrual period?"       Not asked due to age  Protocols used: [place]

## 2017-12-30 ENCOUNTER — Ambulatory Visit: Payer: Medicare Other | Admitting: Physician Assistant

## 2017-12-30 ENCOUNTER — Ambulatory Visit: Payer: Medicare Other | Admitting: Internal Medicine

## 2017-12-30 ENCOUNTER — Encounter

## 2017-12-30 ENCOUNTER — Telehealth (INDEPENDENT_AMBULATORY_CARE_PROVIDER_SITE_OTHER): Payer: Self-pay | Admitting: Orthopedic Surgery

## 2017-12-30 NOTE — Telephone Encounter (Signed)
Patient called stating that her lower back at the base of her spine burns and wanted to know if Dr. Sharol Given would send something in for her for the pain.  She had tried Prednisone, which did not work.  She is to the point that she wants to do herself in (or that's what she feels like doing) because the pain is so bad at night, etc.  CB#[phone].  Thank you.

## 2017-12-31 ENCOUNTER — Telehealth: Payer: Self-pay

## 2017-12-31 ENCOUNTER — Other Ambulatory Visit (INDEPENDENT_AMBULATORY_CARE_PROVIDER_SITE_OTHER): Payer: Self-pay | Admitting: Orthopedic Surgery

## 2017-12-31 MED ORDER — PREGABALIN 100 MG PO CAPS: 100.0000 mg | ORAL_CAPSULE | Freq: Two times a day (BID) | ORAL | 2 refills | Status: DC

## 2017-12-31 MED ORDER — HYDROCODONE-ACETAMINOPHEN 5-325 MG PO TABS: 1.0000 | ORAL_TABLET | ORAL | 0 refills | Status: DC | PRN

## 2017-12-31 NOTE — Telephone Encounter (Signed)
LVM on sons pone for patient or son to call back and answer MD questions below

## 2017-12-31 NOTE — Telephone Encounter (Signed)
Will increase to 100 mg twice a day. Can take 2 pills twice a day until gone then stronger dose at the pharmacy to fill when out.

## 2017-12-31 NOTE — Telephone Encounter (Signed)
Would you like patient to make an appointment to discuss 

## 2017-12-31 NOTE — Addendum Note (Signed)
Addended by: Pricilla Holm A on: 12/31/2017 04:37 PM   Modules accepted: Orders

## 2017-12-31 NOTE — Telephone Encounter (Signed)
Is she taking lyrica right now? If so what dose? We had stopped this is August due to her thought that it was causing her to gain weight.

## 2017-12-31 NOTE — Telephone Encounter (Signed)
Called and lm on vm to advise that rx is at the front desk for pick up.  

## 2017-12-31 NOTE — Telephone Encounter (Signed)
Pt is requesting something for LBP last in the office 07/2017. See message below and advise.

## 2017-12-31 NOTE — Telephone Encounter (Signed)
Taking 50mg  in the morning and 50mg  in the evening. Is experiencing spine pain and wants to know if it can be increased despite weight gain.

## 2017-12-31 NOTE — Telephone Encounter (Signed)
rx written

## 2017-12-31 NOTE — Telephone Encounter (Signed)
Copied from [place] [id number]. Topic: Quick Communication - See Telephone Encounter >> Dec 31, 2017 10:26 AM Hewitt Shorts wrote: Pt is wanting to talk with Dr. Sharlet Salina about increasing her lyrica or changing her medication she is in a great amount of pain   Best number [phone] son's [name]) cell phone >> Dec 31, 2017 11:33 AM Para Skeans A wrote: LOV in 10/2017. Would you like for patient to make an appointment for this?

## 2017-12-31 NOTE — Telephone Encounter (Signed)
Patient informed and stated understanding.

## 2018-01-01 ENCOUNTER — Other Ambulatory Visit: Payer: Self-pay | Admitting: Nurse Practitioner

## 2018-01-01 DIAGNOSIS — E039 Hypothyroidism, unspecified: Secondary | ICD-10-CM

## 2018-01-06 ENCOUNTER — Other Ambulatory Visit: Payer: Self-pay | Admitting: Internal Medicine

## 2018-01-11 NOTE — Telephone Encounter (Signed)
Pt called b/c the new dosage is not working for her; pt is needing and wanting to go back to the 50mg  2x daily; contact pt to advise

## 2018-01-11 NOTE — Telephone Encounter (Signed)
Patient is talking about the lyrica

## 2018-01-12 MED ORDER — PREGABALIN 50 MG PO CAPS: 50.0000 mg | ORAL_CAPSULE | Freq: Two times a day (BID) | ORAL | 5 refills | Status: DC

## 2018-01-12 NOTE — Telephone Encounter (Signed)
Pt following up on the request to go back to  50 mg lyrica and wanted to know if new Rx has been sent in. Pt states she is bloated, it slows down her urination, and even makes her a little dizzy and she does not want to take a 100 mg anymore. Please advise. thanks   [place], [place] [phone] (Phone) [phone] (Fax)

## 2018-01-12 NOTE — Telephone Encounter (Signed)
Sent in lyrica 50 mg to switch back to.

## 2018-01-12 NOTE — Addendum Note (Signed)
Addended by: Pricilla Holm A on: 01/12/2018 10:14 AM   Modules accepted: Orders

## 2018-01-26 ENCOUNTER — Telehealth: Payer: Self-pay | Admitting: Internal Medicine

## 2018-01-26 NOTE — Telephone Encounter (Signed)
Copied from [place] [phone]. Topic: General - Other >> Jan 26, 2018 11:26 AM Chauncey Mann A wrote: Reason for CRM: Pt. Son requesting to get letter stating why pt. Is unable to participate in jury duty, it is not sufficient simply to say pt. Is not well enough to participate.  Pt. Son would be able to pick up letter in the office.  Please return call to pt. Son Caidence Kaseman at [phone] to inform when letter is read for pick up.   Pt. Jury service is December 12th, need letter a minimum of 10 days prior to [employer] duty date.  Pt. Would like letter ASAP.

## 2018-01-26 NOTE — Telephone Encounter (Signed)
Madaline Savage number is [phone]

## 2018-01-26 NOTE — Telephone Encounter (Signed)
Son informed of MD response and stated understanding

## 2018-01-26 NOTE — Telephone Encounter (Signed)
Her son can just return form that she is above a certain age. There is a specific age to stop having jury duty and she is older than that. The form that came in the mail on the back has this as an option and she does not need a letter from doctor.

## 2018-02-04 ENCOUNTER — Telehealth: Payer: Self-pay | Admitting: Cardiovascular Disease

## 2018-02-04 NOTE — Telephone Encounter (Addendum)
° ° °  Patient calling to report pain in "carotid" area for 8 days. Patient offered appointment for today with APP; she declined

## 2018-02-04 NOTE — Telephone Encounter (Signed)
Ms. Casebolt reports "pain in her carotid artery areas" for 7-8 days.  She states it is worse on the L side than the R.  The left side "feels like a quick pain is passing through the artery." The right side "is just annoying." The sensations are short - "they don't last long." Her son got on the phone and reported the patient has also had several episodes of CP since seeing Dr. Burt Knack. She also had a dizzy spell and fell a few days ago.  She is currently asymptomatic. Scheduled patient for evaluation on Monday, 11/11 with Rhonda Barrett (first available spot at both offices).  They were grateful for assistance and understand to seek emergency medical attention if symptoms worsen prior to visit.

## 2018-02-08 ENCOUNTER — Ambulatory Visit: Payer: Medicare Other | Admitting: Physician Assistant

## 2018-02-08 NOTE — Progress Notes (Deleted)
Cardiology Office Note   Date:  02/08/2018   ID:  Brittney Gates, DOB 09-Apr-1924, MRN [id number]  PCP:  Brittney Koch, MD Cardiologist:  Brittney Mocha, MD 08/31/2017 Brittney Ferries, PA-C   No chief complaint on file.   History of Present Illness: Brittney Gates is a 84 y.o. female with a history of HTN, HLD, hypothyroid, anemia, anxiety, PAD w/ mild vasc dz on dopplers 10/2015, OA, [facility]  08/31/2017 office visit, patient complaining of chest pain with typical and atypical features, Lexiscan Myoview was low risk but EF was little low, however echo showed normal EF with no wall motion abnormalities Phone notes 11/07 regarding pain in her carotid area, appointment made  Brittney Gates presents for ***   Past Medical History:  Diagnosis Date  . Acute pancreatitis   . ANEMIA   . ANXIETY   . Arthritis   . Chronic anemia   . Diverticulosis   . Gastritis   . Hemorrhoids   . Hiatal hernia   . HYPERLIPIDEMIA   . HYPERTENSION   . HYPOTHYROIDISM   . LOW BACK PAIN, CHRONIC   . PAD (peripheral artery disease) ([place])     Past Surgical History:  Procedure Laterality Date  . ANGIOPLASTY  1995  . APPENDECTOMY    . BACK SURGERY     x's 2  . BREAST SURGERY     Benign  . FEMUR IM NAIL  01/21/2012   Procedure: INTRAMEDULLARY (IM) NAIL FEMORAL;  Surgeon: Brittney Minion, MD;  Location: [place];  Service: Orthopedics;  Laterality: Left;  Intertrochanteric Nail Left Hip  . HEMORRHOID SURGERY    . IR GENERIC HISTORICAL  05/29/2016   IR SACROPLASTY BILATERAL 05/29/2016 Brittney Bras, MD [facility]  . IR GENERIC HISTORICAL  06/19/2016   IR RADIOLOGIST EVAL & MGMT 06/19/2016 [facility]  . TUBAL LIGATION      Current Outpatient Medications  Medication Sig Dispense Refill  . amLODipine-benazepril (LOTREL) 5-20 MG capsule TAKE ONE CAPSULE BY MOUTH DAILY 90 capsule 0  . aspirin EC 81 MG tablet Take 81 mg by mouth daily.    . Cholecalciferol 2000 UNITS TABS Take 2,000 Units by mouth  daily.    Marland Kitchen HYDROcodone-acetaminophen (NORCO/VICODIN) 5-325 MG tablet Take 1 tablet by mouth every 4 (four) hours as needed for moderate pain. 30 tablet 0  . levothyroxine (SYNTHROID, LEVOTHROID) 100 MCG tablet TAKE ONE TABLET BY MOUTH EVERY MORNING BEFORE BREAKFAST 90 tablet 2  . lidocaine (LIDODERM) 5 % Place 1 patch onto the skin daily. Remove & Discard patch within 12 hours or as directed by MD 30 patch 6  . methocarbamol (ROBAXIN) 500 MG tablet Take 1 tablet (500 mg total) by mouth at bedtime as needed for muscle spasms. 90 tablet 1  . Multiple Vitamin (MULTIVITAMIN WITH MINERALS) TABS tablet Take 1 tablet by mouth daily.    . pregabalin (LYRICA) 50 MG capsule Take 1 capsule (50 mg total) by mouth 2 (two) times daily. 60 capsule 5  . sennosides-docusate sodium (SENOKOT-S) 8.6-50 MG tablet Take 2 tablets by mouth 2 (two) times daily as needed for constipation.     Marland Kitchen zolpidem (AMBIEN) 10 MG tablet TAKE ONE TABLET BY MOUTH EVERY NIGHT AT BEDTIME AS NEEDED FOR SLEEP 90 tablet 1   No current facility-administered medications for this visit.     Allergies:   Statins    Social History:  The patient  reports that she has never smoked. She has never used  smokeless tobacco. She reports that she does not drink alcohol or use drugs.   Family History:  The patient's family history includes Gallbladder disease in her daughter and son; Lung cancer in her father.  She indicated that her mother is deceased. She indicated that her father is deceased. She indicated that her maternal grandmother is deceased. She indicated that her maternal grandfather is deceased. She indicated that her paternal grandmother is deceased. She indicated that her paternal grandfather is deceased. She indicated that the status of her daughter is unknown. She indicated that the status of her son is unknown. She indicated that the status of her neg hx is unknown.    ROS:  Please see the history of present illness. All other systems  are reviewed and negative.    PHYSICAL EXAM: VS:  There were no vitals taken for this visit. , BMI There is no height or weight on file to calculate BMI. GEN: Well nourished, well developed, female in no acute distress HEENT: normal for age  Neck: no JVD, no carotid bruit, no masses Cardiac: RRR; no murmur, no rubs, or gallops Respiratory:  clear to auscultation bilaterally, normal work of breathing GI: soft, nontender, nondistended, + BS MS: no deformity or atrophy; no edema; distal pulses are 2+ in all 4 extremities  Skin: warm and dry, no rash Neuro:  Strength and sensation are intact Psych: euthymic mood, full affect   EKG:  EKG {ACTION; IS/IS IRW:[id number]} ordered today. The ekg ordered today demonstrates ***  ECHO: 09/10/2017 - Left ventricle: The cavity size was mildly dilated. Wall   thickness was normal. Systolic function was normal. The estimated   ejection fraction was in the range of 60% to 65%. Doppler   parameters are consistent with abnormal left ventricular   relaxation (grade 1 diastolic dysfunction). - Aortic valve: There was moderate stenosis. Valve area (VTI): 1.97   cm^2. Valve area (Vmax): 1.97 cm^2. Valve area (Vmean): 1.97   cm^2. - Right atrium: The atrium was mildly dilated.  CATH: ***  MYOVIEW: 09/10/2017  Nuclear stress EF: 45%.  There was no ST segment deviation noted during stress.  No T wave inversion was noted during stress.  Defect 1: There is a small defect of mild severity present in the mid inferoseptal location.  The study is normal.  This is a low risk study.  The left ventricular ejection fraction is mildly decreased (45-54%).   Low risk stress nuclear study with a small fixed mid inferoseptal defect, probably artifact. Otherwise, normal perfusion.  Mildly decreased left ventricular global systolic function, suggest comparison with echo.    Recent Labs: 07/23/2017: ALT 12; BUN 22; Creatinine, Ser 1.05; Hemoglobin 11.3;  Platelets 205.0; Potassium 4.2; Sodium 138; TSH 0.75  CBC    Component Value Date/Time   WBC 10.0 07/23/2017 1625   RBC 3.23 (L) 07/23/2017 1625   HGB 11.3 (L) 07/23/2017 1625   HCT 33.5 (L) 07/23/2017 1625   PLT 205.0 07/23/2017 1625   MCV 103.9 (H) 07/23/2017 1625   MCH 34.8 (H) 07/12/2016 1649   MCHC 33.5 07/23/2017 1625   RDW 22.9 (H) 07/23/2017 1625   LYMPHSABS 3.7 07/12/2016 1649   MONOABS 0.5 07/12/2016 1649   EOSABS 0.1 07/12/2016 1649   BASOSABS 0.0 07/12/2016 1649   CMP Latest Ref Rng & Units 07/23/2017 07/12/2016 05/29/2016  Glucose 70 - 99 mg/dL 104(H) 95 156(H)  BUN 6 - 23 mg/dL 22 13 13   Creatinine 0.40 - 1.20 mg/dL 1.05 0.98 0.94  Sodium 135 - 145 mEq/L 138 137 133(L)  Potassium 3.5 - 5.1 mEq/L 4.2 4.1 4.3  Chloride 96 - 112 mEq/L 105 102 96(L)  CO2 19 - 32 mEq/L 27 27 26   Calcium 8.4 - 10.5 mg/dL 9.3 9.2 8.8(L)  Total Protein 6.0 - 8.3 g/dL 7.0 - -  Total Bilirubin 0.2 - 1.2 mg/dL 0.4 - -  Alkaline Phos 39 - 117 U/L 66 - -  AST 0 - 37 U/L 14 - -  ALT 0 - 35 U/L 12 - -     Lipid Panel    Component Value Date/Time   CHOL 196 10/30/2014 1208   TRIG 147.0 10/30/2014 1208   TRIG 120 03/29/2010   HDL 51.00 10/30/2014 1208   CHOLHDL 4 10/30/2014 1208   VLDL 29.4 10/30/2014 1208   LDLCALC 116 (H) 10/30/2014 1208   LDLDIRECT 143.1 12/08/2012 1223     Wt Readings from Last 3 Encounters:  11/20/17 177 lb (80.3 kg)  09/10/17 174 lb (78.9 kg)  08/31/17 174 lb 12.8 oz (79.3 kg)     Other studies Reviewed: Additional studies/ records that were reviewed today include: ***.  ASSESSMENT AND PLAN:  1.  ***   Current medicines are reviewed at length with the patient today.  The patient {ACTIONS; HAS/DOES NOT HAVE:19233} concerns regarding medicines.  The following changes have been made:  {PLAN; NO CHANGE:13088:s}  Labs/ tests ordered today include: *** No orders of the defined types were placed in this encounter.    Disposition:   FU with Brittney Mocha, MD  Signed, Brittney Ferries, PA-C  02/08/2018 7:58 AM    [place] Phone: [phone]; Fax: [phone]  This note was written with the assistance of speech recognition software.  Please excuse any transcriptional errors.

## 2018-02-11 DIAGNOSIS — H353112 Nonexudative age-related macular degeneration, right eye, intermediate dry stage: Secondary | ICD-10-CM | POA: Diagnosis not present

## 2018-02-11 DIAGNOSIS — H524 Presbyopia: Secondary | ICD-10-CM | POA: Diagnosis not present

## 2018-02-11 DIAGNOSIS — H353121 Nonexudative age-related macular degeneration, left eye, early dry stage: Secondary | ICD-10-CM | POA: Diagnosis not present

## 2018-02-18 DIAGNOSIS — D229 Melanocytic nevi, unspecified: Secondary | ICD-10-CM | POA: Diagnosis not present

## 2018-02-18 DIAGNOSIS — L820 Inflamed seborrheic keratosis: Secondary | ICD-10-CM | POA: Diagnosis not present

## 2018-03-01 DIAGNOSIS — K5900 Constipation, unspecified: Secondary | ICD-10-CM | POA: Diagnosis not present

## 2018-03-01 DIAGNOSIS — R55 Syncope and collapse: Secondary | ICD-10-CM | POA: Diagnosis not present

## 2018-03-01 DIAGNOSIS — R61 Generalized hyperhidrosis: Secondary | ICD-10-CM | POA: Diagnosis not present

## 2018-03-01 DIAGNOSIS — R110 Nausea: Secondary | ICD-10-CM | POA: Diagnosis not present

## 2018-03-09 IMAGING — MR MR LUMBAR SPINE WO/W CM
4 of 7 series · 18 of 48 positions shown · IV contrast (Yes)
Comparison: MRI dated 01/29/2016 and CT scan dated 10/15/2015

CLINICAL DATA: Recurrent severe low back pain. New onset of urinary
incontinence.

EXAM:
MRI LUMBAR SPINE WITHOUT AND WITH CONTRAST
TECHNIQUE: Multiplanar and multiecho pulse sequences of the lumbar spine were
obtained without and with intravenous contrast.
CONTRAST:  15mL MULTIHANCE GADOBENATE DIMEGLUMINE 529 MG/ML IV SOLN

[Series 3: T1 · sagittal · 4.0mm · 0.51mm/px · 3 of 14 slices shown (1 of 2)]
[im 1/14]
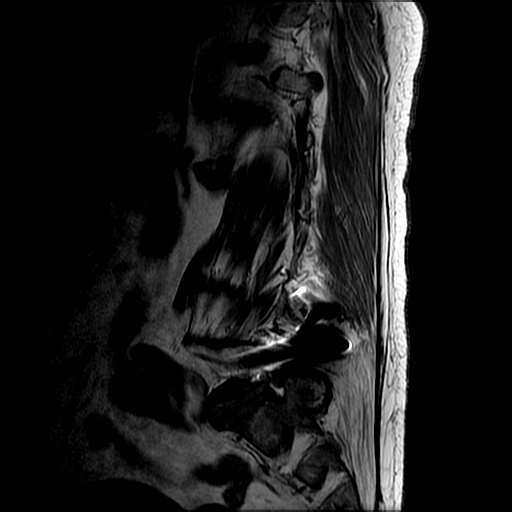
[im 7/14]
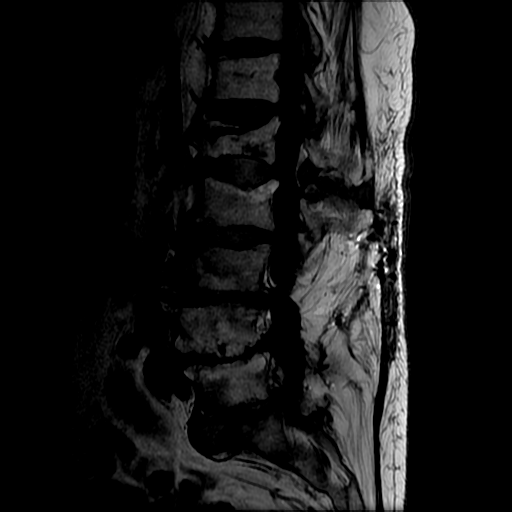
[im 14/14]
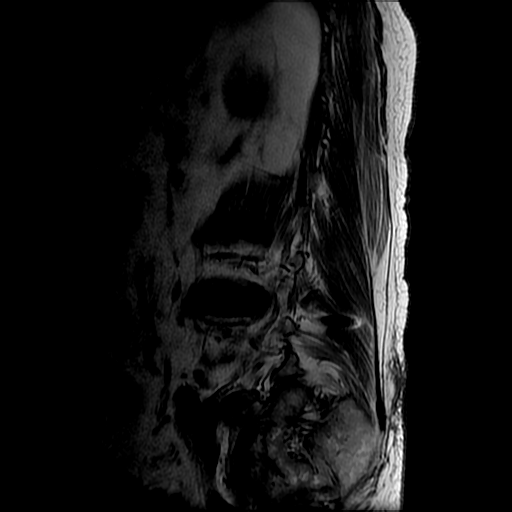

[Series 5: T2 · axial · 4.0mm · 0.39mm/px · z∈[-84,+81]mm · 9 of 38 slices shown]
[im 1/38]
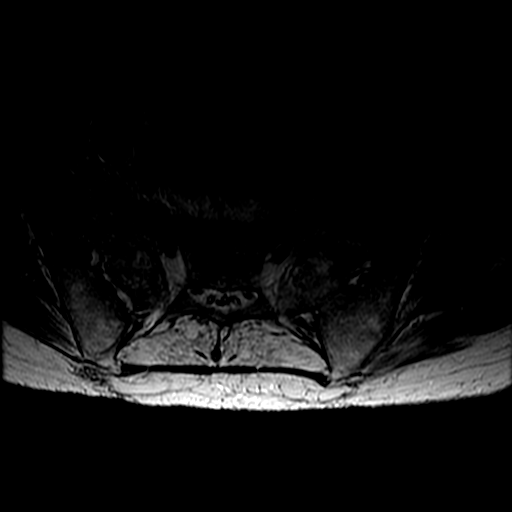
[im 4/38]
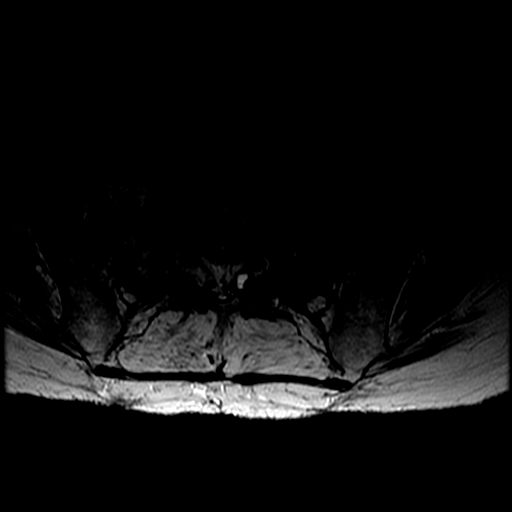
[im 8/38]
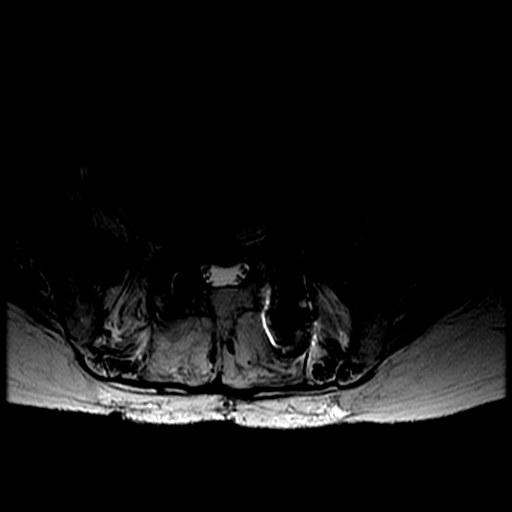
[im 12/38]
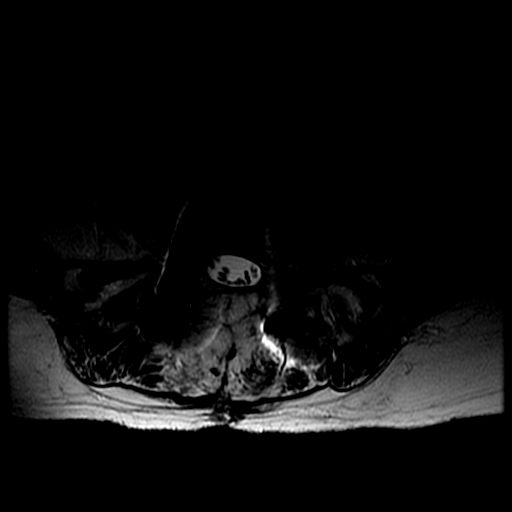
[im 15/38]
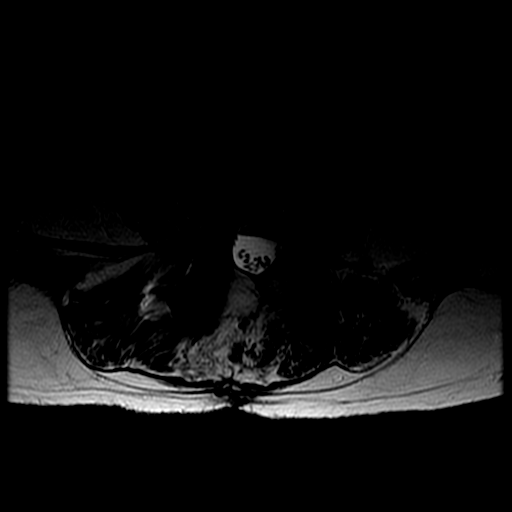
[im 19/38]
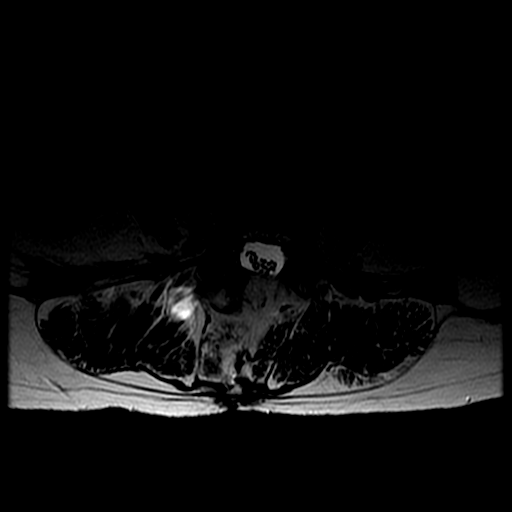
[im 23/38]
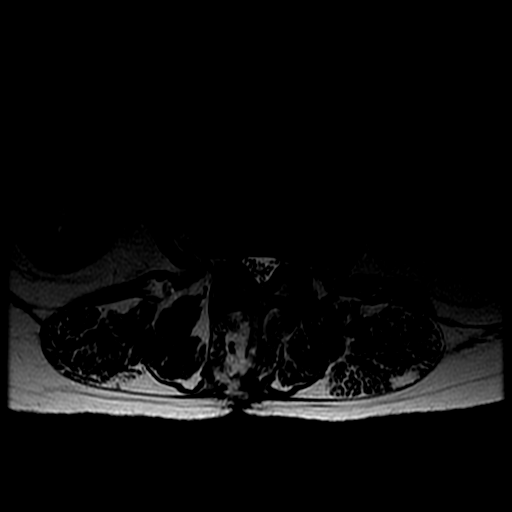
[im 26/38]
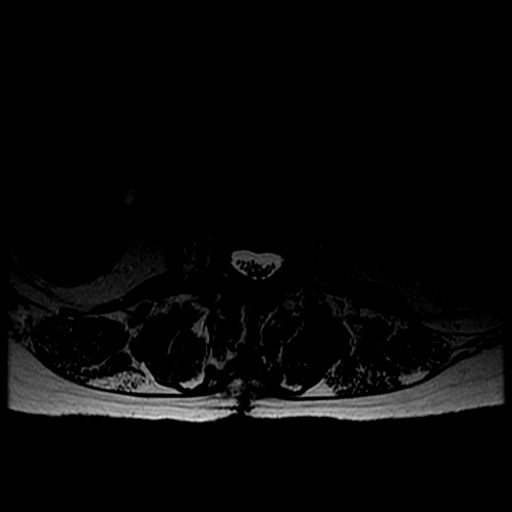
[im 34/38]
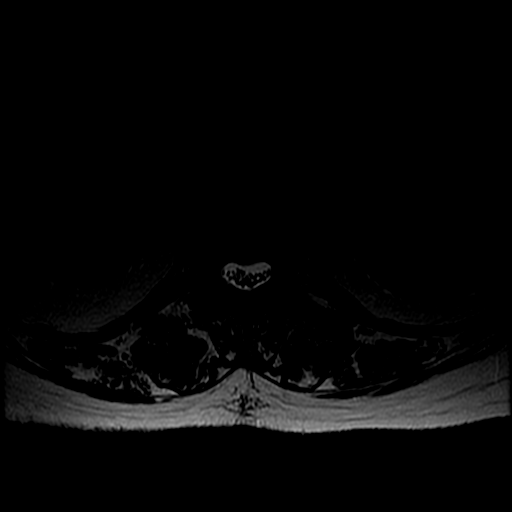

[Series 6: T1 · axial · 4.0mm · 0.39mm/px · z∈[-69,+81]mm · 3 of 38 slices shown (2 of 2)]
[im 4/38]
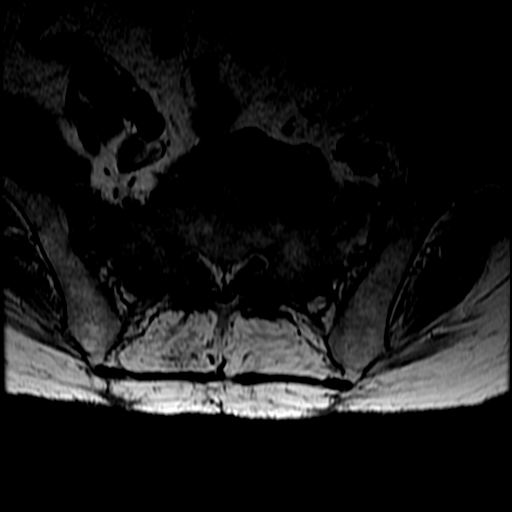
[im 19/38]
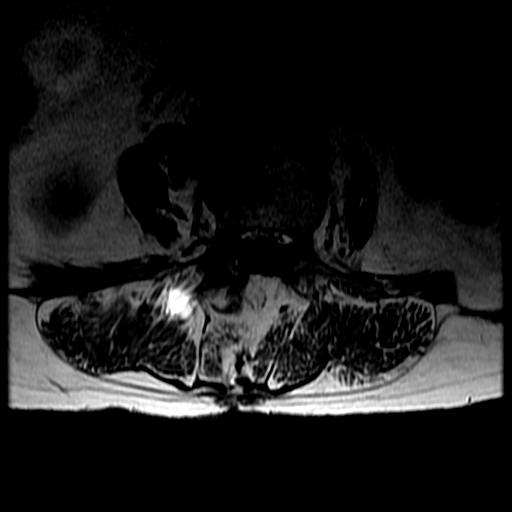
[im 34/38]
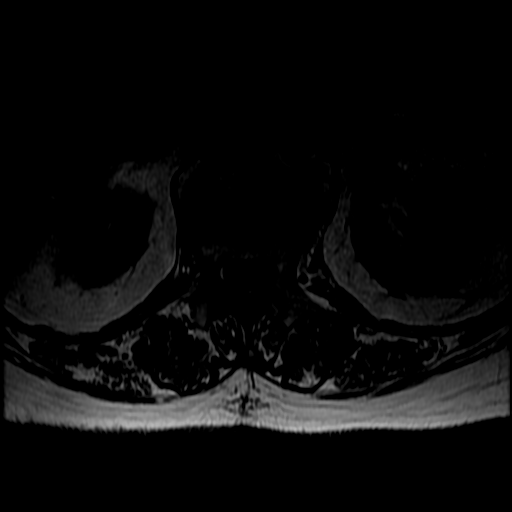

[Series 7: T2 post-contrast · sagittal · 4.0mm · 0.51mm/px · 3 of 14 slices shown]
[im 1/14]
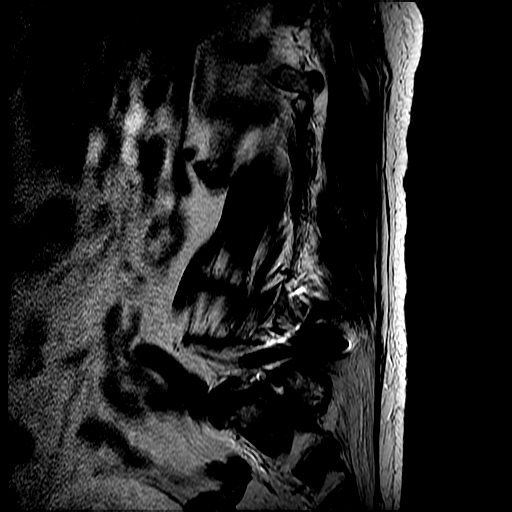
[im 9/14]
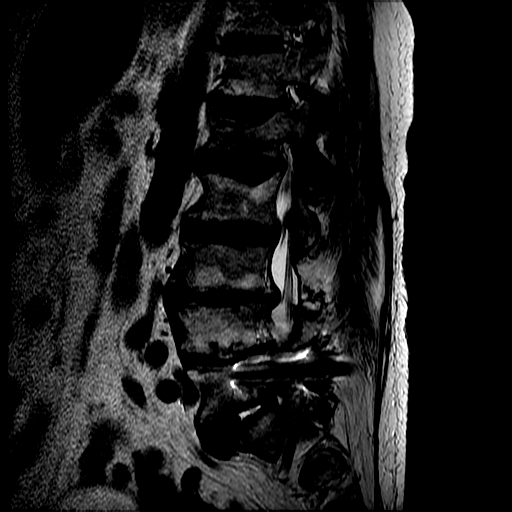
[im 14/14]
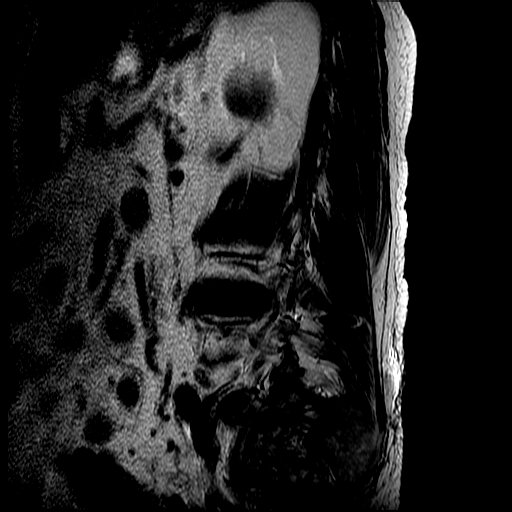

[18 of 48 positions shown; findings below may reference images not displayed]

FINDINGS: Segmentation:  Standard.

Alignment:  Physiologic.

Vertebrae: Old compression fracture of L1 treated with
vertebroplasty. Vertical stress fractures of both sacral ala as well
as a transverse stress fracture through inferior aspect of the S1
segment of the sacrum.

Conus medullaris: Extends to the  T12-L1 level and appears normal.

Paraspinal and other soft tissues: Diverticulosis of the distal
colon.

Disc levels:

T12-L1: No protrusion or bulging of the disc. Slight chronic
protrusion of the posterosuperior aspect of the L1 vertebral body
into the spinal canal without neural impingement. Old healed L1
fracture.

L1-2: Schmorl's nodes in the endplates at L1-2. No disc bulging or
protrusion. Hypertrophy of the ligamentum flavum and facet joints
combine to create moderately severe spinal stenosis without focal
neural impingement.

L2-3: Small broad-based disc bulge most prominent into the left
neural foramen. Short pedicles and slight hypertrophy of the
ligamentum flavum and facet joints combine to create slight spinal
stenosis without focal neural impingement

L3-4: Marked disc space narrowing with broad-based endplate
osteophytes and disc bulging. The thecal sac is compressed by fat
and laminectomy defect. Right extraforaminal disc protrusion appears
to have a slight mass effect upon the exiting right L3 nerve. Left
foraminal protrusion also of appears to have a slight mass effect
upon the left L3 nerve.

L4-5: Posterior fusion with excellent posterior decompression. No
pathologic enhancement after contrast administration.

L5-S1: Progressive severe degenerative disc disease with new fluid
in the disc space with a chronic broad-based soft disc protrusion
with new protrusion into the left neural foramen which appears to
have a mass effect upon the left L5 nerve.
IMPRESSION: 1. New stress fractures of the sacrum.
2. New left foraminal and extraforaminal disc protrusion at L5-S1
with a mass effect upon the left L5 nerve.
3. Moderately severe spinal stenosis at L1-2, unchanged.
4. Compression of the thecal sac at L3-4 by fat at the site of the
laminotomy defect.

## 2018-04-04 ENCOUNTER — Other Ambulatory Visit: Payer: Self-pay | Admitting: Internal Medicine

## 2018-04-05 ENCOUNTER — Other Ambulatory Visit: Payer: Self-pay | Admitting: Internal Medicine

## 2018-04-06 NOTE — Telephone Encounter (Signed)
Called pharmacy last refill 11/18/17 90 tabs LOV: 11/20/2017 [id number]

## 2018-04-23 ENCOUNTER — Telehealth: Payer: Self-pay | Admitting: Cardiovascular Disease

## 2018-04-23 NOTE — Telephone Encounter (Signed)
Per pt's son's call to schedule he would like an afternoon appointment for his mother.  She will only see doctor Burt Knack.

## 2018-04-23 NOTE — Telephone Encounter (Signed)
Scheduled patient 6/12 with Dr. Burt Knack. Left message with appointment and instructed her son to call back and confirm or cancel.

## 2018-04-23 NOTE — Telephone Encounter (Signed)
F/U Message       Patient's son called back to let us know patient only wants see Dr. Burt Knack and they want to see if she can be worked in sometime in June with Burt Knack. Pls call to advise [phone]

## 2018-04-29 ENCOUNTER — Encounter: Payer: Self-pay | Admitting: Gastroenterology

## 2018-04-29 NOTE — Telephone Encounter (Signed)
error 

## 2018-05-17 ENCOUNTER — Ambulatory Visit (INDEPENDENT_AMBULATORY_CARE_PROVIDER_SITE_OTHER): Payer: Medicare Other | Admitting: Orthopedic Surgery

## 2018-05-17 ENCOUNTER — Ambulatory Visit: Payer: Medicare Other | Admitting: Gastroenterology

## 2018-05-19 ENCOUNTER — Ambulatory Visit (INDEPENDENT_AMBULATORY_CARE_PROVIDER_SITE_OTHER): Payer: Medicare Other | Admitting: Gastroenterology

## 2018-05-19 ENCOUNTER — Encounter: Payer: Self-pay | Admitting: Gastroenterology

## 2018-05-19 ENCOUNTER — Other Ambulatory Visit (INDEPENDENT_AMBULATORY_CARE_PROVIDER_SITE_OTHER): Payer: Medicare Other

## 2018-05-19 VITALS — BP 134/60 | HR 64 | Ht 66.0 in | Wt 171.2 lb

## 2018-05-19 DIAGNOSIS — R198 Other specified symptoms and signs involving the digestive system and abdomen: Secondary | ICD-10-CM | POA: Diagnosis not present

## 2018-05-19 DIAGNOSIS — D539 Nutritional anemia, unspecified: Secondary | ICD-10-CM

## 2018-05-19 DIAGNOSIS — R194 Change in bowel habit: Secondary | ICD-10-CM

## 2018-05-19 LAB — CBC WITH DIFFERENTIAL/PLATELET
Basophils Absolute: 0.1 10*3/uL (ref 0.0–0.1)
Basophils Relative: 1.3 % (ref 0.0–3.0)
Eosinophils Absolute: 0.3 10*3/uL (ref 0.0–0.7)
Eosinophils Relative: 3.6 % (ref 0.0–5.0)
HCT: 34.6 % — ABNORMAL LOW (ref 36.0–46.0)
Hemoglobin: 11.3 g/dL — ABNORMAL LOW (ref 12.0–15.0)
Lymphocytes Relative: 43.9 % (ref 12.0–46.0)
Lymphs Abs: 3.5 10*3/uL (ref 0.7–4.0)
MCHC: 32.6 g/dL (ref 30.0–36.0)
MCV: 104.6 fl — ABNORMAL HIGH (ref 78.0–100.0)
Monocytes Absolute: 0.5 10*3/uL (ref 0.1–1.0)
Monocytes Relative: 6 % (ref 3.0–12.0)
Neutro Abs: 3.6 10*3/uL (ref 1.4–7.7)
Neutrophils Relative %: 45.2 % (ref 43.0–77.0)
Platelets: 156 10*3/uL (ref 150.0–400.0)
RBC: 3.31 Mil/uL — ABNORMAL LOW (ref 3.87–5.11)
RDW: 23.5 % — ABNORMAL HIGH (ref 11.5–15.5)
WBC: 7.9 10*3/uL (ref 4.0–10.5)

## 2018-05-19 LAB — FOLATE: Folate: >24 ng/mL (ref >5.9)

## 2018-05-19 LAB — VITAMIN B12: Vitamin B-12: 399 pg/mL (ref 211–911)

## 2018-05-19 LAB — TSH: TSH: 1.61 u[IU]/mL (ref 0.35–4.50)

## 2018-05-19 NOTE — Patient Instructions (Signed)
A high fiber diet with plenty of fluids (up to 8 glasses of water daily) is suggested to relieve these symptoms.  Metamucil, benefiber, or citrucel, 1 tablespoon once or twice daily can be used to keep bowels regular if needed.  Your provider has requested that you go to the basement level for lab work before leaving today. Press "B" on the elevator. The lab is located at the first door on the left as you exit the elevator.  [facility] will call you to schedule you for an MRI appointment. Their phone number is [phone].

## 2018-05-19 NOTE — Progress Notes (Signed)
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   I have recommended confirmation of her macrocytic anemia and screen for B12 and folate deficiencies.  PLAN: Pelvic floor MRI Add daily stool bulking agent (Metamucil, Benefiber, Citrucel) Consider Sitz Mark study Labs today: CBC, B12, folate, TSH Anorectal manometry and balloon expulsion test may be necessary Avoiding the recommendations of colonoscopy given her advanced age Return to clinic after the Pelvic floor MRI   HPI: Brittney Gates is a 85 y.o. female self-referred for constipation.  The history is obtained through the patient and review of her electronic health record.  Her son accompanies her to this appointment and provided some additional details.  She has had prior visits at  [facility] with Drs. Carlean Purl and [place].  She was most recently seen by Wallace Going, NP 09/16/2016 for left rib and left upper quadrant pain.  She had an EGD with Dr. Maurene Capes 06/26/2008 for evaluation of abdominal pain, epigastric pain, right upper quadrant pain, and a normal HIDA with CCK.  She was found to have mild gastritis and a hiatal hernia.  Gastric biopsy showed a reactive gastropathy thought to be due to NSAIDs.  There was no evidence for H. pylori.  Esophageal biopsies showed mild inflammation with no intestinal metaplasia.  She had a colonoscopy with Dr. Carlean Purl 09/29/2006 for constipation with a change in bowel habits.  She was noted to have severe left-sided  diverticulosis.  No polyps or mass were seen.  She reports a longstanding history of Chronic constipation requiring years of multiple stool softeners and laxatives. Initially started after a hip fracture when she was immobilized for several weeks. Followed for years for this by Dr. Olevia Perches. Symptoms were managed with Miralax and manual disimpaction.  She will occasionally use Miralax.  Normally has 1-2 BM, although when there is a second movement the second occurs within 30 minutes. No blood or mucous.  Worsened since Christmas. Since then, feels like the stool moves to the rectum but she is unable to evacuate. Feels the stool moves into a "pocket" and gets stuck there  Frequently feels impacted. Manual assistance with defecation. "I don't feel like I have a rectum." However, her BM over the last two days have been normal.   She has had no recent abdominal imaging.  Labs from 07/23/2017 showing normal comprehensive metabolic panel including normal liver enzymes.  Her glucose was slightly elevated at 104.  White count 10.0, hemoglobin 11.3, MCV 103.9, RDW 22.9, platelets 205.  Past Medical History:  Diagnosis Date  . Acute pancreatitis   . ANEMIA   . ANXIETY   . Arthritis   . Chronic anemia   . Diverticulosis   . Gastritis   . Hemorrhoids   . Hiatal hernia   . HYPERLIPIDEMIA   . HYPERTENSION   . HYPOTHYROIDISM   . LOW BACK PAIN, CHRONIC   . PAD (peripheral artery disease) ([place])     Past Surgical History:  Procedure Laterality Date  . ANGIOPLASTY  1995  . APPENDECTOMY    . BACK SURGERY  x's 2  . BREAST SURGERY     Benign  . FEMUR IM NAIL  01/21/2012   Procedure: INTRAMEDULLARY (IM) NAIL FEMORAL;  Surgeon: Newt Minion, MD;  Location: [facility];  Service: Orthopedics;  Laterality: Left;  Intertrochanteric Nail Left Hip  . HEMORRHOID SURGERY    . IR GENERIC HISTORICAL  05/29/2016   IR SACROPLASTY BILATERAL 05/29/2016 [name], MD [facility]  . IR GENERIC HISTORICAL  06/19/2016     IR RADIOLOGIST EVAL & MGMT 06/19/2016 [facility]  . TUBAL LIGATION      Current Outpatient Medications  Medication Sig Dispense Refill  . amLODipine-benazepril (LOTREL) 5-20 MG capsule TAKE ONE CAPSULE BY MOUTH DAILY 90 capsule 1  . aspirin EC 81 MG tablet Take 81 mg by mouth daily.    . bisacodyl (DULCOLAX) 5 MG EC tablet Take 10 mg by mouth daily as needed for moderate constipation.    . Cholecalciferol 2000 UNITS TABS Take 2,000 Units by mouth daily.    Mariane Baumgarten Sodium (DULCOLAX STOOL SOFTENER PO) Take 1 tablet by mouth daily.    Marland Kitchen HYDROcodone-acetaminophen (NORCO/VICODIN) 5-325 MG tablet Take 1 tablet by mouth every 4 (four) hours as needed for moderate pain. 30 tablet 0  . levothyroxine (SYNTHROID, LEVOTHROID) 100 MCG tablet TAKE ONE TABLET BY MOUTH EVERY MORNING BEFORE BREAKFAST 90 tablet 2  . methocarbamol (ROBAXIN) 500 MG tablet Take 1 tablet (500 mg total) by mouth at bedtime as needed for muscle spasms. 90 tablet 1  . Multiple Vitamin (MULTIVITAMIN WITH MINERALS) TABS tablet Take 1 tablet by mouth daily.    . pregabalin (LYRICA) 50 MG capsule Take 1 capsule (50 mg total) by mouth 2 (two) times daily. 60 capsule 5  . sennosides-docusate sodium (SENOKOT-S) 8.6-50 MG tablet Take 2 tablets by mouth 2 (two) times daily as needed for constipation.     Marland Kitchen zolpidem (AMBIEN) 10 MG tablet TAKE ONE TABLET BY MOUTH EVERY NIGHT AT BEDTIME AS NEEDED FOR SLEEP 90 tablet 0   No current facility-administered medications for this visit.     Allergies as of 05/19/2018 - Review Complete 05/19/2018  Allergen Reaction Noted  . Statins Other (See Comments) 04/30/2012    Family History  Problem Relation Age of Onset  . Lung cancer Father   . Gallbladder disease Son   . Gallbladder disease Daughter   . Colon cancer Neg Hx     Social History   Socioeconomic History  . Marital status: Widowed    Spouse name: Not on file  . Number of children: 4  . Years of education: Not on file  .  Highest education level: Not on file  Occupational History  . Occupation: retired  Scientific laboratory technician  . Financial resource strain: Not on file  . Food insecurity:    Worry: Not on file    Inability: Not on file  . Transportation needs:    Medical: Not on file    Non-medical: Not on file  Tobacco Use  . Smoking status: Never Smoker  . Smokeless tobacco: Never Used  Substance and Sexual Activity  . Alcohol use: No    Alcohol/week: 0.0 standard drinks  . Drug use: No  . Sexual activity: Not on file  Lifestyle  . Physical activity:    Days per week: Not on file    Minutes per session: Not on file  . Stress: Not on file  Relationships  . Social connections:    Talks on phone: Not on file  Gets together: Not on file    Attends religious service: Not on file    Active member of club or organization: Not on file    Attends meetings of clubs or organizations: Not on file    Relationship status: Not on file  . Intimate partner violence:    Fear of current or ex partner: Not on file    Emotionally abused: Not on file    Physically abused: Not on file    Forced sexual activity: Not on file  Other Topics Concern  . 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  Filed Weights   05/19/18 1342  Weight: 171 lb 4 oz (77.7 kg)    Physical Exam: Vital signs were reviewed. General:   Alert, well-nourished, pleasant and cooperative in NAD Head:  Normocephalic and atraumatic. Eyes:  Sclera clear, no icterus.   Conjunctiva pink. Mouth:  No deformity or lesions.   Neck:  Supple; no thyromegaly. Lungs:  Clear throughout to auscultation.   No wheezes. Heart:  Regular rate and rhythm; no murmurs Abdomen:  Soft, central obesity,   nontender, normal bowel sounds. No rebound or guarding. No hepatosplenomegaly Rectal:  Rectal:  No chemical dermatitis. No external hemorrhoids, fissure or fistula. No prolapsing hemorrhoids. No rectal prolapse. Normal anocutaneous reflex. Minimal perineal descent with bear down.  Brown stool in the rectal vault. No mass or fecal impaction. Normal anal resting tone. Chaperone: Desire Msk:  Symmetrical without gross deformities. Extremities:  No gross deformities or edema. Neurologic:  Alert and oriented x4;  grossly nonfocal. Midline lumbar spine surgery. Skin:  No rash or bruise. Psych:  Alert and cooperative. Normal mood and affect.   Staley Budzinski L. Tarri Glenn, MD, MPH [facility] 05/19/2018, 5:34 PM

## 2018-05-31 ENCOUNTER — Telehealth: Payer: Self-pay | Admitting: Gastroenterology

## 2018-05-31 ENCOUNTER — Other Ambulatory Visit: Payer: Self-pay | Admitting: Gastroenterology

## 2018-05-31 ENCOUNTER — Ambulatory Visit
Admission: RE | Admit: 2018-05-31 | Discharge: 2018-05-31 | Disposition: A | Discharge status: Home / Self Care | Payer: Medicare Other | Source: Ambulatory Visit | Attending: Gastroenterology | Admitting: Gastroenterology

## 2018-05-31 DIAGNOSIS — N816 Rectocele: Secondary | ICD-10-CM | POA: Diagnosis not present

## 2018-05-31 DIAGNOSIS — N8110 Cystocele, unspecified: Secondary | ICD-10-CM | POA: Diagnosis not present

## 2018-05-31 DIAGNOSIS — R194 Change in bowel habit: Secondary | ICD-10-CM

## 2018-05-31 NOTE — Telephone Encounter (Signed)
Brittney Gates from [facility] called would like to speak to someone about the MR Pelvis W WO Contrast that is schedule for today 3-2 @ 3pm . If someone could call her back phone [phone]

## 2018-06-01 NOTE — Telephone Encounter (Signed)
MRI has already been done and reviewed by Dr. Tarri Glenn.

## 2018-06-05 ENCOUNTER — Other Ambulatory Visit: Payer: Self-pay | Admitting: Internal Medicine

## 2018-06-25 ENCOUNTER — Ambulatory Visit: Payer: Medicare Other | Admitting: Gastroenterology

## 2018-07-15 ENCOUNTER — Other Ambulatory Visit: Payer: Self-pay | Admitting: Internal Medicine

## 2018-07-16 NOTE — Telephone Encounter (Signed)
Control database checked last refill: 04/06/2018 LOV: 11/20/2017 [id number]

## 2018-08-27 ENCOUNTER — Telehealth: Payer: Self-pay

## 2018-08-27 NOTE — Telephone Encounter (Signed)
Patient's son/john has called and while on speaker phone with patient---patient is stating that for the last 6 to 8 months she has experienced pain and decreased motion in both arms, especially at deltoid muscle and any kind of rotating motion with her hands.  In the last month, it has daily gotten worse and she states  She cant peel potatoes or normal cleaning--the use of these motions is severely diminished---she has continued taking lyrica, as she states she's already asked dr crawford if this could be related to taking lyrica and was told it should not be related to use of lyrica---she's already talked with cardiology and cardiology states should not be problem related to heart---I have offered video visit and explained safety/risks for trying to do virtual visit instead of In office visit, patient is very adamant about coming into the office to be seen---she is really very negative with the idea of a video visit---patient is asymptomatic for facial drooping, chest pain, shortness of breath, blurred vision, n/v/d----routing to dr crawford, please advise, I will call patient back at son's cell phone [phone]

## 2018-08-27 NOTE — Telephone Encounter (Signed)
Can be seen in office next Thursday or Friday

## 2018-08-27 NOTE — Telephone Encounter (Signed)
After speaking to patient and her son, they would like to do this as virtual. Appointment has been scheduled as a virtual visit next week.

## 2018-08-27 NOTE — Telephone Encounter (Signed)
Routing to carson/sched, can you please schedule IN OFFICE visit with dr Sharlet [place] for next week, Thursday or Friday, thanks----ok to call her son/john at number below

## 2018-09-02 ENCOUNTER — Telehealth: Payer: Self-pay

## 2018-09-02 ENCOUNTER — Ambulatory Visit: Payer: Medicare Other | Admitting: Internal Medicine

## 2018-09-02 ENCOUNTER — Encounter: Payer: Self-pay | Admitting: Cardiovascular Disease

## 2018-09-02 NOTE — Telephone Encounter (Signed)
No note needed 

## 2018-09-02 NOTE — Telephone Encounter (Signed)
Left message on son's [name]) phone about pt's upcoming appt with [place]. Instructed him to call back and let us know the preference for the visit (video or phone).

## 2018-09-06 ENCOUNTER — Ambulatory Visit (INDEPENDENT_AMBULATORY_CARE_PROVIDER_SITE_OTHER): Payer: Medicare Other | Admitting: Internal Medicine

## 2018-09-06 ENCOUNTER — Ambulatory Visit (INDEPENDENT_AMBULATORY_CARE_PROVIDER_SITE_OTHER)
Admission: RE | Admit: 2018-09-06 | Discharge: 2018-09-06 | Disposition: A | Discharge status: Home / Self Care | Payer: Medicare Other | Source: Ambulatory Visit | Attending: Internal Medicine | Admitting: Internal Medicine

## 2018-09-06 ENCOUNTER — Encounter: Payer: Self-pay | Admitting: Internal Medicine

## 2018-09-06 ENCOUNTER — Other Ambulatory Visit: Payer: Self-pay

## 2018-09-06 VITALS — BP 124/70 | HR 73 | Temp 97.8°F | Ht 66.0 in | Wt 172.0 lb

## 2018-09-06 DIAGNOSIS — M150 Primary generalized (osteo)arthritis: Secondary | ICD-10-CM

## 2018-09-06 DIAGNOSIS — M159 Polyosteoarthritis, unspecified: Secondary | ICD-10-CM

## 2018-09-06 DIAGNOSIS — M47812 Spondylosis without myelopathy or radiculopathy, cervical region: Secondary | ICD-10-CM | POA: Diagnosis not present

## 2018-09-06 DIAGNOSIS — M5416 Radiculopathy, lumbar region: Secondary | ICD-10-CM | POA: Diagnosis not present

## 2018-09-06 DIAGNOSIS — M8949 Other hypertrophic osteoarthropathy, multiple sites: Secondary | ICD-10-CM

## 2018-09-06 DIAGNOSIS — R200 Anesthesia of skin: Secondary | ICD-10-CM

## 2018-09-06 MED ORDER — HYDROCODONE-ACETAMINOPHEN 5-325 MG PO TABS: 1.0000 | ORAL_TABLET | ORAL | 0 refills | Status: DC | PRN

## 2018-09-06 NOTE — Progress Notes (Signed)
   Subjective:   Patient ID: Brittney Gates, female    DOB: January 05, 1925, 85 y.o.   MRN: [id number]  HPI The patient is a 85 YO female coming in for concerns about hand numbness (has had carpal tunnel many years ago, saw Dr Sharol Given about this more than a year ago and he took x-ray of the shoulder and neck, they do not recall him saying anything about these, numbness worsening in the last year or so but even before then, now mostly her hands feel like there is soap on them all the time, she is dropping things and hard to eat and hard to carry things, this is impacting her QOL severely, denies taking anything for it, pain is manageable) and back pain (chronic and taking lyrica 50 mg daily for this, she has reduced from BID to daily as she was concerned about gaining weight, it does help with the pain well but not as well at daily, denies recent fall or injury to her back) and reducing function (mostly due to the back pain and leg instability as well as the numbness in her hands, cannot eat well or grab objects due to dropping things, using walker to move around and denies falls due to being very careful).   Review of Systems  Constitutional: Positive for activity change and appetite change.  HENT: Negative.   Eyes: Negative.   Respiratory: Negative for cough, chest tightness and shortness of breath.   Cardiovascular: Negative for chest pain, palpitations and leg swelling.  Gastrointestinal: Negative for abdominal distention, abdominal pain, constipation, diarrhea, nausea and vomiting.  Musculoskeletal: Positive for arthralgias, back pain, gait problem, myalgias and neck pain. Negative for neck stiffness.  Skin: Negative.   Neurological: Positive for numbness. Negative for dizziness, seizures, syncope, speech difficulty, light-headedness and headaches.  Psychiatric/Behavioral: Negative.     Objective:  Physical Exam Constitutional:      Appearance: She is well-developed.  HENT:     Head: Normocephalic  and atraumatic.  Neck:     Musculoskeletal: Normal range of motion.  Cardiovascular:     Rate and Rhythm: Normal rate and regular rhythm.  Pulmonary:     Effort: Pulmonary effort is normal. No respiratory distress.     Breath sounds: Normal breath sounds. No wheezing or rales.  Abdominal:     General: Bowel sounds are normal. There is no distension.     Palpations: Abdomen is soft.     Tenderness: There is no abdominal tenderness. There is no rebound.  Musculoskeletal:        General: Tenderness present.  Skin:    General: Skin is warm and dry.  Neurological:     Mental Status: She is alert and oriented to person, place, and time.     Cranial Nerves: Cranial nerve deficit present.     Sensory: Sensory deficit present.     Coordination: Coordination normal.     Comments: Numbness both hands to the wrists with decrease in sensation, rolling walker to walk     Vitals:   09/06/18 1500  BP: 124/70  Pulse: 73  Temp: 97.8 F (36.6 C)  TempSrc: Oral  SpO2: 97%  Weight: 172 lb (78 kg)  Height: 5\' 6"  (1.676 m)    Assessment & Plan:

## 2018-09-06 NOTE — Patient Instructions (Signed)
We will check the x-ray today and refilled the pain medicine to the pharmacy.

## 2018-09-07 ENCOUNTER — Telehealth: Payer: Self-pay | Admitting: Gastroenterology

## 2018-09-07 ENCOUNTER — Other Ambulatory Visit: Payer: Self-pay | Admitting: Internal Medicine

## 2018-09-07 DIAGNOSIS — R200 Anesthesia of skin: Secondary | ICD-10-CM

## 2018-09-07 NOTE — Telephone Encounter (Signed)
Pt's son called regarding instructions on previous office note.

## 2018-09-07 NOTE — Telephone Encounter (Signed)
Spoke with Ms. Rokia and her son [name] (on speakerphone) she is concerned about doing pelvic floor therapy while the pandemic is still going on, I reassured her that this is fine and we can still wait to put in the referral whenever she is ready. She also states she did try fiber on a few different occasions and her son adds that he watched her take it sitting up properly, and every time she gets choked or can't swallow.   She stopped taking the dulcolax and would like to know if she can restart it or if there is something else you recommend to help her have an easier bm.

## 2018-09-07 NOTE — Telephone Encounter (Signed)
Metamucil comes in several forms. Would she do better with a wafer? If not, perhaps trying Benefiber in her favorite liquid would be an option. Thank you.

## 2018-09-07 NOTE — Telephone Encounter (Signed)
[phone] pt son is returning your call

## 2018-09-07 NOTE — Assessment & Plan Note (Signed)
Suspect that this is related to the severe arthritis in her neck. Counseled her and son about this during visit and they were confused about how this could affect hands and multiple times stated that they just want her to improve with this as it is limiting life. Ordered x-ray cervical to compare to prior about 1 year ago. If worsening refer to neurosurgery. If stable they prefer a hand specialist.

## 2018-09-07 NOTE — Assessment & Plan Note (Addendum)
We talked about how this is the cause of a lot of her symptoms. She has gotten hydrocodone in the past in small supply to take only when pain is severe and limiting function from orthopedics. Agree to give her small supply #30 hydrocodone today and counseled about the use and benefit versus risk. We also reminded her that osteoarthritis in general worsens with time and does not usually improve. [place] narcotic database reviewed prior to rx and no inappropriate fills.

## 2018-09-07 NOTE — Assessment & Plan Note (Signed)
This is overall worsening with time. She is not taking lyrica as prescribed and this may be why it is not working well. Counseled about the maximum dose of this and the very long dose she is on. She will think about taking it BID but is very concerned about gaining weight from it.

## 2018-09-08 NOTE — Telephone Encounter (Signed)
Thank you :)

## 2018-09-08 NOTE — Telephone Encounter (Signed)
Informed patient about the metamucil wafer she will try that. She thanks you for getting back to her!

## 2018-09-08 NOTE — Telephone Encounter (Signed)
Spoke with Brittney Gates and obtained consent for phone visit. Son will take Brittney Gates's BP prior to appt and have it ready.    YOUR CARDIOLOGY TEAM HAS ARRANGED FOR AN E-VISIT FOR YOUR APPOINTMENT - PLEASE REVIEW IMPORTANT INFORMATION BELOW SEVERAL DAYS PRIOR TO YOUR APPOINTMENT  Due to the recent COVID-19 pandemic, we are transitioning in-person office visits to tele-medicine visits in an effort to decrease unnecessary exposure to our patients, their families, and staff. These visits are billed to your insurance just like a normal visit is. We also encourage you to sign up for MyChart if you have not already done so. You will need a smartphone if possible. For patients that do not have this, we can still complete the visit using a regular telephone but do prefer a smartphone to enable video when possible. You may have a family member that lives with you that can help. If possible, we also ask that you have a blood pressure cuff and scale at home to measure your blood pressure, heart rate and weight prior to your scheduled appointment. Patients with clinical needs that need an in-person evaluation and testing will still be able to come to the office if absolutely necessary. If you have any questions, feel free to call our office.     YOUR PROVIDER WILL BE USING THE FOLLOWING PLATFORM TO COMPLETE YOUR VISIT: Doximity . IF USING MYCHART - How to Download the MyChart App to Your SmartPhone   - If Apple, go to [employer] and type in MyChart in the search bar and download the app. If Android, ask patient to go to [employer] and type in [place] in the search bar and download the app. The app is free but as with any other app downloads, your phone may require you to verify saved payment information or Apple/Android password.  - You will need to then log into the app with your MyChart username and password, and select [facility] as your healthcare provider to link the account.  - When it is time for your visit, go to  the MyChart app, find appointments, and click Begin Video Visit. Be sure to Select Allow for your device to access the Microphone and Camera for your visit. You will then be connected, and your provider will be with you shortly.  **If you have any issues connecting or need assistance, please contact MyChart service desk [phone])**  **If using a computer, in order to ensure the best quality for your visit, you will need to use either of the following Internet Browsers: Insurance underwriter or [employer]**  . IF USING DOXIMITY or DOXY.ME - The staff will give you instructions on receiving your link to join the meeting the day of your visit.      2-3 DAYS BEFORE YOUR APPOINTMENT  You will receive a telephone call from one of our [facility] team members - your caller ID may say "Unknown caller." If this is a video visit, we will walk you through how to get the video launched on your phone. We will remind you check your blood pressure, heart rate and weight prior to your scheduled appointment. If you have an Apple Watch or Kardia, please upload any pertinent ECG strips the day before or morning of your appointment to [facility]. Our staff will also make sure you have reviewed the consent and agree to move forward with your scheduled tele-health visit.     THE DAY OF YOUR APPOINTMENT  Approximately 15 minutes prior to  your scheduled appointment, you will receive a telephone call from one of [facility] team - your caller ID may say "Unknown caller."  Our staff will confirm medications, vital signs for the day and any symptoms you may be experiencing. Please have this information available prior to the time of visit start. It may also be helpful for you to have a pad of paper and pen handy for any instructions given during your visit. They will also walk you through joining the smartphone meeting if this is a video visit.    CONSENT FOR TELE-HEALTH VISIT - PLEASE REVIEW  I hereby  voluntarily request, consent and authorize [facility] and its employed or contracted physicians, physician assistants, nurse practitioners or other licensed health care professionals (the Practitioner), to provide me with telemedicine health care services (the "Services") as deemed necessary by the treating Practitioner. I acknowledge and consent to receive the Services by the Practitioner via telemedicine. I understand that the telemedicine visit will involve communicating with the Practitioner through live audiovisual communication technology and the disclosure of certain medical information by electronic transmission. I acknowledge that I have been given the opportunity to request an in-person assessment or other available alternative prior to the telemedicine visit and am voluntarily participating in the telemedicine visit.  I understand that I have the right to withhold or withdraw my consent to the use of telemedicine in the course of my care at any time, without affecting my right to future care or treatment, and that the Practitioner or I may terminate the telemedicine visit at any time. I understand that I have the right to inspect all information obtained and/or recorded in the course of the telemedicine visit and may receive copies of available information for a reasonable fee.  I understand that some of the potential risks of receiving the Services via telemedicine include:  Marland Kitchen Delay or interruption in medical evaluation due to technological equipment failure or disruption; . Information transmitted may not be sufficient (e.g. poor resolution of images) to allow for appropriate medical decision making by the Practitioner; and/or  . In rare instances, security protocols could fail, causing a breach of personal health information.  Furthermore, I acknowledge that it is my responsibility to provide information about my medical history, conditions and care that is complete and accurate to the best  of my ability. I acknowledge that Practitioner's advice, recommendations, and/or decision may be based on factors not within their control, such as incomplete or inaccurate data provided by me or distortions of diagnostic images or specimens that may result from electronic transmissions. I understand that the practice of medicine is not an exact science and that Practitioner makes no warranties or guarantees regarding treatment outcomes. I acknowledge that I will receive a copy of this consent concurrently upon execution via email to the email address I last provided but may also request a printed copy by calling the office of [facility].    I understand that my insurance will be billed for this visit.   I have read or had this consent read to me. . I understand the contents of this consent, which adequately explains the benefits and risks of the Services being provided via telemedicine.  . I have been provided ample opportunity to ask questions regarding this consent and the Services and have had my questions answered to my satisfaction. . I give my informed consent for the services to be provided through the use of telemedicine in my medical care  By participating in  this telemedicine visit I agree to the above.

## 2018-09-09 ENCOUNTER — Other Ambulatory Visit: Payer: Self-pay | Admitting: Internal Medicine

## 2018-09-10 ENCOUNTER — Other Ambulatory Visit: Payer: Self-pay

## 2018-09-10 ENCOUNTER — Telehealth (INDEPENDENT_AMBULATORY_CARE_PROVIDER_SITE_OTHER): Payer: Medicare Other | Admitting: Cardiovascular Disease

## 2018-09-10 ENCOUNTER — Encounter: Payer: Self-pay | Admitting: Cardiovascular Disease

## 2018-09-10 VITALS — BP 140/55 | HR 59 | Ht 66.0 in | Wt 172.0 lb

## 2018-09-10 DIAGNOSIS — R0602 Shortness of breath: Secondary | ICD-10-CM

## 2018-09-10 NOTE — Progress Notes (Signed)
Virtual Visit via Telephone Note   This visit type was conducted due to national recommendations for restrictions regarding the COVID-19 Pandemic (e.g. social distancing) in an effort to limit this patient's exposure and mitigate transmission in our community.  Due to her co-morbid illnesses, this patient is at least at moderate risk for complications without adequate follow up.  This format is felt to be most appropriate for this patient at this time.  The patient did not have access to video technology/had technical difficulties with video requiring transitioning to audio format only (telephone).  All issues noted in this document were discussed and addressed.  No physical exam could be performed with this format.  Please refer to the patient's chart for her  consent to telehealth for [facility].   Date:  09/10/2018   ID:  Brittney Gates, DOB [birth year], MRN [id number]  Patient Location: Home Provider Location: Home  PCP:  [name], MD  Cardiologist:  Sherren Mocha, MD  Electrophysiologist:  None   Evaluation Performed:  Follow-Up Visit  Chief Complaint:  Shortness of breath  History of Present Illness:    Brittney Gates is a 85 y.o. female with history of chest pain and shortness of breath, presenting for follow-up evaluation.  The patient has had episodic chest pain over the years, at the time of her last visit in June 2019 she reported more frequent symptoms.  She also complained of worsening shortness of breath.  Echo and nuclear studies were obtained.  The patient's echo study demonstrated normal LV systolic function with mild aortic stenosis.  The patient does not have symptoms concerning for COVID-19 infection (fever, chills, cough, or new shortness of breath).   She reports episodic chest discomfort, sometimes under the left breast and other times located in the center of the chest. Symptoms are not related to physical exertion. Reports symptoms are not  progressive. Mild shortness of breath, also unchanged over time. No edema, orthopnea, or PND. No palpitations, lightheadedness, or syncope.   She has had upper arm pain and feels this is related to advanced arthritis of the cervical spine. She's most limited by numbness in pain in the feet and legs, related to lumbar spine problems and previous surgery.    Past Medical History:  Diagnosis Date  . Acute pancreatitis   . ANEMIA   . ANXIETY   . Arthritis   . Chronic anemia   . Diverticulosis   . Gastritis   . Hemorrhoids   . Hiatal hernia   . HYPERLIPIDEMIA   . HYPERTENSION   . HYPOTHYROIDISM   . LOW BACK PAIN, CHRONIC   . PAD (peripheral artery disease) ([place])    Past Surgical History:  Procedure Laterality Date  . ANGIOPLASTY  1995  . APPENDECTOMY    . BACK SURGERY     x's 2  . BREAST SURGERY     Benign  . FEMUR IM NAIL  01/21/2012   Procedure: INTRAMEDULLARY (IM) NAIL FEMORAL;  Surgeon: Newt Minion, MD;  Location: [facility];  Service: Orthopedics;  Laterality: Left;  Intertrochanteric Nail Left Hip  . HEMORRHOID SURGERY    . IR GENERIC HISTORICAL  05/29/2016   IR SACROPLASTY BILATERAL 05/29/2016 [name], MD [facility]  . IR GENERIC HISTORICAL  06/19/2016   IR RADIOLOGIST EVAL & MGMT 06/19/2016 [facility]  . TUBAL LIGATION       Current Meds  Medication Sig  . amLODipine-benazepril (LOTREL) 5-20 MG capsule TAKE ONE CAPSULE BY MOUTH  DAILY  . aspirin EC 81 MG tablet Take 81 mg by mouth daily.  . bisacodyl (DULCOLAX) 5 MG EC tablet Take 10 mg by mouth daily as needed for moderate constipation.  . Cholecalciferol 2000 UNITS TABS Take 2,000 Units by mouth daily.  Marland Kitchen HYDROcodone-acetaminophen (NORCO/VICODIN) 5-325 MG tablet Take 1 tablet by mouth every 4 (four) hours as needed for moderate pain.  Marland Kitchen levothyroxine (SYNTHROID, LEVOTHROID) 100 MCG tablet TAKE ONE TABLET BY MOUTH EVERY MORNING BEFORE BREAKFAST  . methocarbamol (ROBAXIN) 500 MG tablet TAKE ONE TABLET BY MOUTH  EVERY NIGHT AT BEDTIME AS NEEDED FOR MUSCLE SPASMS  . Multiple Vitamin (MULTIVITAMIN WITH MINERALS) TABS tablet Take 1 tablet by mouth daily.  . pregabalin (LYRICA) 50 MG capsule Take 50 mg by mouth daily.  . sennosides-docusate sodium (SENOKOT-S) 8.6-50 MG tablet Take 2 tablets by mouth 2 (two) times daily as needed for constipation.   Marland Kitchen zolpidem (AMBIEN) 10 MG tablet TAKE ONE TABLET BY MOUTH EVERY NIGHT AT BEDTIME AS NEEDED FOR SLEEP     Allergies:   Statins and Metamucil [psyllium]   Social History   Tobacco Use  . Smoking status: Never Smoker  . Smokeless tobacco: Never Used  Substance Use Topics  . Alcohol use: No    Alcohol/week: 0.0 standard drinks  . Drug use: No     Family Hx: The patient's family history includes Gallbladder disease in her daughter and son; Lung cancer in her father. There is no history of Colon cancer.  ROS:   Please see the history of present illness.    All other systems reviewed and are negative.   Prior CV studies:   The following studies were reviewed today:  Myoview stress test 09/10/2017: Study Highlights    Nuclear stress EF: 45%.  There was no ST segment deviation noted during stress.  No T wave inversion was noted during stress.  Defect 1: There is a small defect of mild severity present in the mid inferoseptal location.  The study is normal.  This is a low risk study.  The left ventricular ejection fraction is mildly decreased (45-54%).   Low risk stress nuclear study with a small fixed mid inferoseptal defect, probably artifact. Otherwise, normal perfusion.  Mildly decreased left ventricular global systolic function, suggest comparison with echo.    Echocardiogram 09/10/2017: Left ventricle:  The cavity size was mildly dilated. Wall thickness was normal. Systolic function was normal. The estimated ejection fraction was in the range of 60% to 65%. Doppler parameters are consistent with abnormal left ventricular relaxation  (grade 1 diastolic dysfunction).  ------------------------------------------------------------------- Aortic valve:   Mildly thickened, mildly calcified leaflets. Cusp separation was normal.  Doppler:   There was moderate stenosis. There was no regurgitation.    VTI ratio of LVOT to aortic valve: 0.4. Valve area (VTI): 1.97 cm^2. Indexed valve area (VTI): 1 cm^2/m^2. Peak velocity ratio of LVOT to aortic valve: 0.4. Valve area (Vmax): 1.97 cm^2. Indexed valve area (Vmax): 1 cm^2/m^2. Mean velocity ratio of LVOT to aortic valve: 0.4. Valve area (Vmean): 1.97 cm^2. Indexed valve area (Vmean): 1 cm^2/m^2.    Mean gradient (S): 13 mm Hg. Peak gradient (S): 24 mm Hg.  ------------------------------------------------------------------- Aorta:  Aortic root: The aortic root was normal in size. Ascending aorta: The ascending aorta was normal in size.  ------------------------------------------------------------------- Mitral valve:   Structurally normal valve.   Leaflet separation was normal.  Doppler:  Transvalvular velocity was within the normal range. There was no evidence for stenosis. There  was trivial regurgitation.    Peak gradient (D): 2 mm Hg.  ------------------------------------------------------------------- Left atrium:  The atrium was normal in size.  ------------------------------------------------------------------- Right ventricle:  The cavity size was normal. Systolic function was normal.  ------------------------------------------------------------------- Pulmonic valve:    The valve appears to be grossly normal. Doppler:  There was no significant regurgitation.  ------------------------------------------------------------------- Tricuspid valve:   The valve appears to be grossly normal. Doppler:  There was no significant regurgitation.  ------------------------------------------------------------------- Right atrium:  The atrium was mildly dilated.   ------------------------------------------------------------------- Pericardium:  There was no pericardial effusion.   Labs/Other Tests and Data Reviewed:    EKG:  An ECG dated 08/31/2017 was personally reviewed today and demonstrated:  NSR with ST-T abnormality consider lateral ischemia  Recent Labs: 05/19/2018: Hemoglobin 11.3; Platelets 156.0; TSH 1.61   Recent Lipid Panel Lab Results  Component Value Date/Time   CHOL 196 10/30/2014 12:08 PM   TRIG 147.0 10/30/2014 12:08 PM   TRIG 120 03/29/2010   HDL 51.00 10/30/2014 12:08 PM   CHOLHDL 4 10/30/2014 12:08 PM   LDLCALC 116 (H) 10/30/2014 12:08 PM   LDLDIRECT 143.1 12/08/2012 12:23 PM    Wt Readings from Last 3 Encounters:  09/10/18 172 lb (78 kg)  09/06/18 172 lb (78 kg)  05/19/18 171 lb 4 oz (77.7 kg)     Objective:    Vital Signs:  BP (!) 140/55   Pulse (!) 59   Ht 5\' 6"  (1.676 m)   Wt 172 lb (78 kg)   BMI 27.76 kg/m    VITAL SIGNS:  reviewed Alert, oriented woman in NAD. Breathing comfortably in normal conversation. Remaining exam deferred as this is a telephone visit.   ASSESSMENT & PLAN:    1. Chest pain: previous evaluation 1 year ago demonstrated normal LV function and no ischemic changes on her Myoview scan.  She has a lot of different types of pain related to advanced osteoarthritis.  I think considering her advanced age and comorbid conditions it is best to manage her symptomatically at this point.  I asked her to call back if she has any progressive cardiac symptoms and I would try to add her on for an in person visit with further evaluation if needed. 2. Mild aortic stenosis: Most recent echocardiogram is reviewed.  I like her to have a repeat echocardiogram study next year when she returns for an in person visit.  COVID-19 Education: The signs and symptoms of COVID-19 were discussed with the patient and how to seek care for testing (follow up with PCP or arrange E-visit). The importance of social  distancing was discussed today.  Time:   Today, I have spent 18 minutes with the patient with telehealth technology discussing the above problems.     Medication Adjustments/Labs and Tests Ordered: Current medicines are reviewed at length with the patient today.  Concerns regarding medicines are outlined above.   Tests Ordered: No orders of the defined types were placed in this encounter.   Medication Changes: No orders of the defined types were placed in this encounter.   Follow Up:  Virtual Visit or In Person in 1 year(s)  Signed, Sherren Mocha, MD  09/10/2018 1:29 PM    [facility]

## 2018-09-10 NOTE — Patient Instructions (Signed)
Medication Instructions:  Your provider recommends that you continue on your current medications as directed. Please refer to the Current Medication list given to you today.    Labwork: None  Testing/Procedures: Your provider has requested that you have an echocardiogram in 1 year. Echocardiography is a painless test that uses sound waves to create images of your heart. It provides your doctor with information about the size and shape of your heart and how well your heart's chambers and valves are working. This procedure takes approximately one hour. There are no restrictions for this procedure.  Follow-Up: You will be called to schedule your 1 year echocardiogram and same day office visit with Dr. Burt Knack when his schedule for next June is available.

## 2018-09-10 NOTE — Addendum Note (Signed)
Addended by: Harland German A on: 09/10/2018 02:50 PM   Modules accepted: Orders

## 2018-09-30 ENCOUNTER — Telehealth: Payer: Self-pay | Admitting: Cardiovascular Disease

## 2018-09-30 ENCOUNTER — Other Ambulatory Visit: Payer: Self-pay | Admitting: Internal Medicine

## 2018-09-30 DIAGNOSIS — E039 Hypothyroidism, unspecified: Secondary | ICD-10-CM

## 2018-09-30 NOTE — Telephone Encounter (Signed)
Spoke with the patient, she stated she was still having the off and on chest pain that she had when she saw Dr. Burt Knack. She said it is not any worse she just wants to see him in person for a visit.

## 2018-09-30 NOTE — Telephone Encounter (Signed)
New Message     Pt c/o of Chest Pain: STAT if CP now or developed within 24 hours  1. Are you having CP right now? On and off all morning  2. Are you experiencing any other symptoms (ex. SOB, nausea, vomiting, sweating)? Fatigued  3. How long have you been experiencing CP? Off and on for a few days   4. Is your CP continuous or coming and going? Coming and Going   5. Have you taken Nitroglycerin? NO ?

## 2018-10-08 NOTE — Telephone Encounter (Signed)
Called to check on patient. She states she still has on and off chest pain and can sometimes feel thumping in her chest. She states she is concerned and her son is too. She is unhappy she did not get to see Dr. Burt Knack in person for her visit. Scheduled the patient for in-office visit 7/15 with B. Bhagat, PA. She understands to seek immediate medical attention if her symptoms worsen prior to that visit.

## 2018-10-12 ENCOUNTER — Telehealth: Payer: Self-pay | Admitting: Physician Assistant

## 2018-10-12 NOTE — Telephone Encounter (Signed)
New Message         COVID-19 Pre-Screening Questions:   In the past 7 to 10 days have you had a cough,  shortness of breath, headache, congestion, fever (100 or greater) body aches, chills, sore throat, or sudden loss of taste or sense of smell? NO  Have you been around anyone with known Covid 19. NO  Have you been around anyone who is awaiting Covid 19 test results in the past 7 to 10 days? NO  Have you been around anyone who has been exposed to Covid 19, or has mentioned symptoms of Covid 19 within the past 7 to 10 days? NO Pts son will be assisting her while walking and answered NO to all questions   If you have any concerns/questions about symptoms patients report during screening (either on the phone or at threshold). Contact the provider seeing the patient or DOD for further guidance.  If neither are available contact a member of the leadership team.

## 2018-10-13 ENCOUNTER — Ambulatory Visit (INDEPENDENT_AMBULATORY_CARE_PROVIDER_SITE_OTHER): Payer: Medicare Other | Admitting: Physician Assistant

## 2018-10-13 ENCOUNTER — Encounter: Payer: Self-pay | Admitting: Physician Assistant

## 2018-10-13 ENCOUNTER — Other Ambulatory Visit: Payer: Self-pay

## 2018-10-13 VITALS — BP 118/58 | HR 54 | Ht 66.0 in | Wt 172.1 lb

## 2018-10-13 DIAGNOSIS — R079 Chest pain, unspecified: Secondary | ICD-10-CM

## 2018-10-13 DIAGNOSIS — I350 Nonrheumatic aortic (valve) stenosis: Secondary | ICD-10-CM

## 2018-10-13 DIAGNOSIS — I10 Essential (primary) hypertension: Secondary | ICD-10-CM | POA: Diagnosis not present

## 2018-10-13 DIAGNOSIS — R002 Palpitations: Secondary | ICD-10-CM

## 2018-10-13 DIAGNOSIS — Z7189 Other specified counseling: Secondary | ICD-10-CM | POA: Diagnosis not present

## 2018-10-13 MED ORDER — ISOSORBIDE MONONITRATE ER 30 MG PO TB24: 15.0000 mg | ORAL_TABLET | Freq: Every day | ORAL | 3 refills | Status: DC

## 2018-10-13 NOTE — Progress Notes (Signed)
Cardiology Office Note    Date:  10/13/2018   ID:  PRESTON WEILL, DOB May 03, 1924, MRN [id number]  PCP:  [name], MD  Cardiologist:  Dr. Burt Knack   Chief Complaint: CP/palpitations  History of Present Illness:   Brittney Gates is a 85 y.o. female HTN and HLD seen for chest pain and palpitations.   The patient with hx of episodic chest pain for many years. Noted worsening episode with SOB 08/2017. Follow up echo showed normal LVEF and moderate AS. Low risk stress nuclear study with a small fixed mid inferoseptal defect, probably artifact. Otherwise, normal perfusion.   Virtually seen by Dr. Burt Knack 09/10/2018. Continued to have episode chest, arm and breast pain. Felt more related to advanced arthritis. Recommended symptomatic management.   Presents today for evaluation of chest pain and skipped beat with son.  She has noted progressive decline in energy and shortness of breath with exertion.  No exertional chest pain.  However, she has intermittent 2 types of chest pain.  First occurs underneath her breasts and radiates to her left shoulder which she described as a sharp/dull achy sensation.  Seconds underneath at midsternal area which she described as "skipped pressure-like sensation".  She denies history of GERD.  She denies associated palpitation.  No orthopnea, PND, syncope, dizziness or melena.   Past Medical History:  Diagnosis Date  . Acute pancreatitis   . ANEMIA   . ANXIETY   . Arthritis   . Chronic anemia   . Diverticulosis   . Gastritis   . Hemorrhoids   . Hiatal hernia   . HYPERLIPIDEMIA   . HYPERTENSION   . HYPOTHYROIDISM   . LOW BACK PAIN, CHRONIC   . PAD (peripheral artery disease) ([place])     Past Surgical History:  Procedure Laterality Date  . ANGIOPLASTY  1995  . APPENDECTOMY    . BACK SURGERY     x's 2  . BREAST SURGERY     Benign  . FEMUR IM NAIL  01/21/2012   Procedure: INTRAMEDULLARY (IM) NAIL FEMORAL;  Surgeon: Newt Minion, MD;   Location: [facility];  Service: Orthopedics;  Laterality: Left;  Intertrochanteric Nail Left Hip  . HEMORRHOID SURGERY    . IR GENERIC HISTORICAL  05/29/2016   IR SACROPLASTY BILATERAL 05/29/2016 [name], MD [facility]  . IR GENERIC HISTORICAL  06/19/2016   IR RADIOLOGIST EVAL & MGMT 06/19/2016 [facility]  . TUBAL LIGATION      Current Medications: Prior to Admission medications   Medication Sig Start Date End Date Taking? Authorizing Provider  amLODipine-benazepril (LOTREL) 5-20 MG capsule TAKE ONE CAPSULE BY MOUTH DAILY 09/30/18   [name], MD  aspirin EC 81 MG tablet Take 81 mg by mouth daily.    [provider]  bisacodyl (DULCOLAX) 5 MG EC tablet Take 10 mg by mouth daily as needed for moderate constipation.    [provider]  Cholecalciferol 2000 UNITS TABS Take 2,000 Units by mouth daily.    [provider]  HYDROcodone-acetaminophen (NORCO/VICODIN) 5-325 MG tablet Take 1 tablet by mouth every 4 (four) hours as needed for moderate pain. 09/06/18   [name], MD  levothyroxine (SYNTHROID) 100 MCG tablet TAKE ONE TABLET BY MOUTH EVERY MORNING BEFORE BREAKFAST 09/30/18   [name], MD  methocarbamol (ROBAXIN) 500 MG tablet TAKE ONE TABLET BY MOUTH EVERY NIGHT AT BEDTIME AS NEEDED FOR MUSCLE SPASMS 09/09/18   [name], MD  Multiple  Vitamin (MULTIVITAMIN WITH MINERALS) TABS tablet Take 1 tablet by mouth daily.    [provider]  pregabalin (LYRICA) 50 MG capsule Take 50 mg by mouth daily.    [provider]  sennosides-docusate sodium (SENOKOT-S) 8.6-50 MG tablet Take 2 tablets by mouth 2 (two) times daily as needed for constipation.     [provider]  zolpidem (AMBIEN) 10 MG tablet TAKE ONE TABLET BY MOUTH EVERY NIGHT AT BEDTIME AS NEEDED FOR SLEEP 07/16/18   [name], MD    Allergies:   Statins and Metamucil [psyllium]   Social History   Socioeconomic History  .  Marital status: Widowed    Spouse name: Not on file  . Number of children: 4  . Years of education: Not on file  . Highest education level: Not on file  Occupational History  . Occupation: retired  Scientific laboratory technician  . Financial resource strain: Not on file  . Food insecurity    Worry: Not on file    Inability: Not on file  . Transportation needs    Medical: Not on file    Non-medical: Not on file  Tobacco Use  . Smoking status: Never Smoker  . Smokeless tobacco: Never Used  Substance and Sexual Activity  . Alcohol use: No    Alcohol/week: 0.0 standard drinks  . Drug use: No  . Sexual activity: Not on file  Lifestyle  . Physical activity    Days per week: Not on file    Minutes per session: Not on file  . Stress: Not on file  Relationships  . Social Herbalist on phone: Not on file    Gets together: Not on file    Attends religious service: Not on file    Active member of club or organization: Not on file    Attends meetings of clubs or organizations: Not on file    Relationship status: Not on file  Other Topics Concern  . 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   ROS:   Please see the history of present illness.    ROS All other systems reviewed and are negative.   PHYSICAL EXAM:   VS:  BP (!) 118/58   Pulse (!) 54   Ht 5\' 6"  (1.676 m)   Wt 172 lb 1.9 oz (78.1 kg)   SpO2 97%   BMI 27.78 kg/m    GEN: Well nourished, well developed, in no acute distress  HEENT: normal  Neck: no JVD, carotid bruits, or masses Cardiac: RRR; 3/6 systolic murmurs, rubs, or gallops,no edema  Respiratory:  clear to auscultation bilaterally, normal work of  breathing GI: soft, nontender, nondistended, + BS MS: no deformity or atrophy  Skin: warm and dry, no rash Neuro:  Alert and Oriented x 3, Strength and sensation are intact Psych: euthymic mood, full affect  Wt Readings from Last 3 Encounters:  10/13/18 172 lb 1.9 oz (78.1 kg)  09/10/18 172 lb (78 kg)  09/06/18 172 lb (78 kg)      Studies/Labs Reviewed:   EKG:  EKG is ordered today.  The ekg ordered today demonstrates  sinus bradycardia at rate of 54 bpm, T wave inversion inferior laterally which is chronic  Recent Labs: 05/19/2018: Hemoglobin 11.3; Platelets 156.0; TSH 1.61   Lipid Panel    Component Value Date/Time   CHOL 196 10/30/2014 1208   TRIG 147.0 10/30/2014 1208   TRIG 120 03/29/2010   HDL 51.00 10/30/2014 1208   CHOLHDL 4 10/30/2014 1208   VLDL 29.4 10/30/2014 1208   LDLCALC 116 (H) 10/30/2014 1208   LDLDIRECT 143.1 12/08/2012 1223    Additional studies/ records that were reviewed today include:   Echocardiogram: 08/2017 Study Conclusions  - Left ventricle: The cavity size was mildly dilated. Wall   thickness was normal. Systolic function was normal. The estimated   ejection fraction was in the range of 60% to 65%. Doppler   parameters are consistent with abnormal left ventricular   relaxation (grade 1 diastolic dysfunction). - Aortic valve: There was moderate stenosis. Valve area (VTI): 1.97   cm^2. Valve area (Vmax): 1.97 cm^2. Valve area (Vmean): 1.97   cm^2. - Right atrium: The atrium was mildly dilated.  Stress test 08/2017  Nuclear stress EF: 45%.  There was no ST segment deviation noted during stress.  No T wave inversion was noted during stress.  Defect 1: There is a small defect of mild severity present in the mid inferoseptal location.  The study is normal.  This is a low risk study.  The left ventricular ejection fraction is mildly decreased (45-54%).   Low risk stress nuclear study with a small fixed mid inferoseptal defect,  probably artifact. Otherwise, normal perfusion.  Mildly decreased left ventricular global systolic function, suggest comparison with echo.     ASSESSMENT & PLAN:    1. Chest pain Mixed symptoms.  Difficult to differentiate etiology.  Low risk stress test 08/2017.  EKG showed inferior lateral T wave inversion which is chronic.  Continue aspirin.  Will add low-dose Imdur for trial.  2.  Aortic stenosis -Echocardiogram June 2019 showed mild/moderate aortic stenosis.  Patient has noted gradual decline in energy and dyspnea on exertion since last year however symptoms more worsen in past few months.  She was very active however now only able to walk few feets because of fatigue and dyspnea.  -We will repeat echocardiogram.  3.  Sinus bradycardia/skipped beat -We will get monitor for further evaluation of her symptoms. No syncope. Not on any rate control agent.    Medication Adjustments/Labs and Tests Ordered: Current medicines are reviewed at length with the patient today.  Concerns regarding medicines are outlined above.  Medication changes, Labs and Tests ordered today are listed in the Patient Instructions below. Patient Instructions  Medication Instructions:  Your physician has recommended you make the following change in your medication:  1.  START Imdur 30 mg take 1/2 tablet daily  If you need a refill on your cardiac medications before your next appointment, please call your pharmacy.   Lab work: None ordered  If you have labs (blood work) drawn today and your tests are completely normal, you will receive your results only by: Marland Kitchen MyChart Message (if you have MyChart) OR . A paper copy in the mail If you have any lab test that is abnormal or we need to change your treatment, we will call you to review the results.  Testing/Procedures: Your physician has requested that you have an echocardiogram. Echocardiography is a painless test that uses sound waves to create images of your  heart. It provides your doctor with information  about the size and shape of your heart and how well your heart's chambers and valves are working. This procedure takes approximately one hour. There are no restrictions for this procedure.   Your physician has recommended that you wear a heart monitor. Heart monitors are medical devices that record the heart's electrical activity. Doctors most often use these monitors to diagnose arrhythmias. Arrhythmias are problems with the speed or rhythm of the heartbeat. The monitor is a small, portable device. You can wear one while you do your normal daily activities. This is usually used to diagnose what is causing palpitations/syncope (passing out).   Follow-Up: At [facility], you and your health needs are our priority.  As part of our continuing mission to provide you with exceptional heart care, we have created designated Provider Care Teams.  These Care Teams include your primary Cardiologist (physician) and Advanced Practice Providers (APPs -  Physician Assistants and Nurse Practitioners) who all work together to provide you with the care you need, when you need it. You will need a follow up appointment on 11/24/2018 at 1:45 with Richardson Dopp, PA-C  Any Other Special Instructions Will Be Listed Below (If Applicable).  Echocardiogram An echocardiogram is a procedure that uses painless sound waves (ultrasound) to produce an image of the heart. Images from an echocardiogram can provide important information about:  Signs of coronary artery disease (CAD).  Aneurysm detection. An aneurysm is a weak or damaged part of an artery wall that bulges out from the normal force of blood pumping through the body.  Heart size and shape. Changes in the size or shape of the heart can be associated with certain conditions, including heart failure, aneurysm, and CAD.  Heart muscle function.  Heart valve function.  Signs of a past heart attack.  Fluid buildup  around the heart.  Thickening of the heart muscle.  A tumor or infectious growth around the heart valves. Tell a health care provider about:  Any allergies you have.  All medicines you are taking, including vitamins, herbs, eye drops, creams, and over-the-counter medicines.  Any blood disorders you have.  Any surgeries you have had.  Any medical conditions you have.  Whether you are pregnant or may be pregnant. What are the risks? Generally, this is a safe procedure. However, problems may occur, including:  Allergic reaction to dye (contrast) that may be used during the procedure. What happens before the procedure? No specific preparation is needed. You may eat and drink normally. What happens during the procedure?   An IV tube may be inserted into one of your veins.  You may receive contrast through this tube. A contrast is an injection that improves the quality of the pictures from your heart.  A gel will be applied to your chest.  A wand-like tool (transducer) will be moved over your chest. The gel will help to transmit the sound waves from the transducer.  The sound waves will harmlessly bounce off of your heart to allow the heart images to be captured in real-time motion. The images will be recorded on a computer. The procedure may vary among health care providers and hospitals. What happens after the procedure?  You may return to your normal, everyday life, including diet, activities, and medicines, unless your health care provider tells you not to do that. Summary  An echocardiogram is a procedure that uses painless sound waves (ultrasound) to produce an image of the heart.  Images from an echocardiogram can provide important information about  the size and shape of your heart, heart muscle function, heart valve function, and fluid buildup around your heart.  You do not need to do anything to prepare before this procedure. You may eat and drink normally.  After  the echocardiogram is completed, you may return to your normal, everyday life, unless your health care provider tells you not to do that. This information is not intended to replace advice given to you by your health care provider. Make sure you discuss any questions you have with your health care provider. Document Released: 03/14/2000 Document Revised: 07/08/2018 Document Reviewed: 04/19/2016 Elsevier Patient Education  2020 [place], [place], [place]  10/13/2018 2:39 PM    [facility] [address], [place], [place]  [address] Phone: [phone]; Fax: [phone]

## 2018-10-13 NOTE — Patient Instructions (Signed)
Medication Instructions:  Your physician has recommended you make the following change in your medication:  1.  START Imdur 30 mg take 1/2 tablet daily  If you need a refill on your cardiac medications before your next appointment, please call your pharmacy.   Lab work: None ordered  If you have labs (blood work) drawn today and your tests are completely normal, you will receive your results only by: Marland Kitchen MyChart Message (if you have MyChart) OR . A paper copy in the mail If you have any lab test that is abnormal or we need to change your treatment, we will call you to review the results.  Testing/Procedures: Your physician has requested that you have an echocardiogram. Echocardiography is a painless test that uses sound waves to create images of your heart. It provides your doctor with information about the size and shape of your heart and how well your heart's chambers and valves are working. This procedure takes approximately one hour. There are no restrictions for this procedure.   Your physician has recommended that you wear a heart monitor. Heart monitors are medical devices that record the heart's electrical activity. Doctors most often use these monitors to diagnose arrhythmias. Arrhythmias are problems with the speed or rhythm of the heartbeat. The monitor is a small, portable device. You can wear one while you do your normal daily activities. This is usually used to diagnose what is causing palpitations/syncope (passing out).   Follow-Up: At [facility], you and your health needs are our priority.  As part of our continuing mission to provide you with exceptional heart care, we have created designated Provider Care Teams.  These Care Teams include your primary Cardiologist (physician) and Advanced Practice Providers (APPs -  Physician Assistants and Nurse Practitioners) who all work together to provide you with the care you need, when you need it. You will need a follow up  appointment on 11/24/2018 at 1:45 with Richardson Dopp, PA-C  Any Other Special Instructions Will Be Listed Below (If Applicable).  Echocardiogram An echocardiogram is a procedure that uses painless sound waves (ultrasound) to produce an image of the heart. Images from an echocardiogram can provide important information about:  Signs of coronary artery disease (CAD).  Aneurysm detection. An aneurysm is a weak or damaged part of an artery wall that bulges out from the normal force of blood pumping through the body.  Heart size and shape. Changes in the size or shape of the heart can be associated with certain conditions, including heart failure, aneurysm, and CAD.  Heart muscle function.  Heart valve function.  Signs of a past heart attack.  Fluid buildup around the heart.  Thickening of the heart muscle.  A tumor or infectious growth around the heart valves. Tell a health care provider about:  Any allergies you have.  All medicines you are taking, including vitamins, herbs, eye drops, creams, and over-the-counter medicines.  Any blood disorders you have.  Any surgeries you have had.  Any medical conditions you have.  Whether you are pregnant or may be pregnant. What are the risks? Generally, this is a safe procedure. However, problems may occur, including:  Allergic reaction to dye (contrast) that may be used during the procedure. What happens before the procedure? No specific preparation is needed. You may eat and drink normally. What happens during the procedure?   An IV tube may be inserted into one of your veins.  You may receive contrast through this tube. A contrast is an  injection that improves the quality of the pictures from your heart.  A gel will be applied to your chest.  A wand-like tool (transducer) will be moved over your chest. The gel will help to transmit the sound waves from the transducer.  The sound waves will harmlessly bounce off of your heart  to allow the heart images to be captured in real-time motion. The images will be recorded on a computer. The procedure may vary among health care providers and hospitals. What happens after the procedure?  You may return to your normal, everyday life, including diet, activities, and medicines, unless your health care provider tells you not to do that. Summary  An echocardiogram is a procedure that uses painless sound waves (ultrasound) to produce an image of the heart.  Images from an echocardiogram can provide important information about the size and shape of your heart, heart muscle function, heart valve function, and fluid buildup around your heart.  You do not need to do anything to prepare before this procedure. You may eat and drink normally.  After the echocardiogram is completed, you may return to your normal, everyday life, unless your health care provider tells you not to do that. This information is not intended to replace advice given to you by your health care provider. Make sure you discuss any questions you have with your health care provider. Document Released: 03/14/2000 Document Revised: 07/08/2018 Document Reviewed: 04/19/2016 Elsevier Patient Education  2020 [employer].

## 2018-10-18 ENCOUNTER — Telehealth: Payer: Self-pay | Admitting: *Deleted

## 2018-10-18 NOTE — Telephone Encounter (Signed)
14 ZIO XT long term holter monitor to be mailed to patients home.  Instructions reviewed briefly as the are included in the monitor kit.

## 2018-10-18 NOTE — Telephone Encounter (Signed)
Do not apply monitor until after your Echocardiogram on 10/26/18.  It will be in the way for the ultrasound tech and you cannot take this monitor off and reapply it.

## 2018-10-24 ENCOUNTER — Other Ambulatory Visit: Payer: Self-pay | Admitting: Internal Medicine

## 2018-10-26 ENCOUNTER — Ambulatory Visit (HOSPITAL_COMMUNITY): Payer: Medicare Other | Attending: Internal Medicine

## 2018-10-26 ENCOUNTER — Other Ambulatory Visit: Payer: Self-pay

## 2018-10-26 DIAGNOSIS — I350 Nonrheumatic aortic (valve) stenosis: Secondary | ICD-10-CM | POA: Insufficient documentation

## 2018-10-26 NOTE — Telephone Encounter (Signed)
Control database checked last refill: Lyrica- 09/09/2018 60 tbas Ambien- 07/17/2018 90 tabs  LOV: 09/06/2018 NOV: none

## 2018-10-26 NOTE — Telephone Encounter (Signed)
Patient called stating she will need the pads that are for sensitive skin.

## 2018-10-27 NOTE — Telephone Encounter (Signed)
Patient had not applied monitor until after her Echocardiogram on 10/26/2018 as instructed.  Returned call to inform patient the ZIO patch is the "sticker".  There is no sensitive skin alternative for this type of monitor.

## 2018-10-29 ENCOUNTER — Ambulatory Visit (INDEPENDENT_AMBULATORY_CARE_PROVIDER_SITE_OTHER): Payer: Medicare Other

## 2018-10-29 DIAGNOSIS — R002 Palpitations: Secondary | ICD-10-CM

## 2018-11-14 DIAGNOSIS — Z23 Encounter for immunization: Secondary | ICD-10-CM | POA: Diagnosis not present

## 2018-11-22 DIAGNOSIS — R002 Palpitations: Secondary | ICD-10-CM | POA: Diagnosis not present

## 2018-11-24 ENCOUNTER — Ambulatory Visit: Payer: Medicare Other | Admitting: Physician Assistant

## 2018-11-25 ENCOUNTER — Other Ambulatory Visit: Payer: Self-pay

## 2018-11-25 ENCOUNTER — Ambulatory Visit (INDEPENDENT_AMBULATORY_CARE_PROVIDER_SITE_OTHER): Payer: Medicare Other | Admitting: Internal Medicine

## 2018-11-25 ENCOUNTER — Encounter: Payer: Self-pay | Admitting: Internal Medicine

## 2018-11-25 ENCOUNTER — Other Ambulatory Visit: Payer: Self-pay | Admitting: *Deleted

## 2018-11-25 ENCOUNTER — Telehealth: Payer: Self-pay | Admitting: Gastroenterology

## 2018-11-25 DIAGNOSIS — Z20822 Contact with and (suspected) exposure to covid-19: Secondary | ICD-10-CM

## 2018-11-25 DIAGNOSIS — R509 Fever, unspecified: Secondary | ICD-10-CM | POA: Diagnosis not present

## 2018-11-25 DIAGNOSIS — R278 Other lack of coordination: Secondary | ICD-10-CM

## 2018-11-25 DIAGNOSIS — R6889 Other general symptoms and signs: Secondary | ICD-10-CM | POA: Diagnosis not present

## 2018-11-25 MED ORDER — AMOXICILLIN-POT CLAVULANATE 875-125 MG PO TABS: 1.0000 | ORAL_TABLET | Freq: Two times a day (BID) | ORAL | 0 refills | Status: DC

## 2018-11-25 NOTE — Telephone Encounter (Signed)
Left message for the patient to call back.

## 2018-11-25 NOTE — Progress Notes (Signed)
Virtual Visit via Audio Note  I connected with Brittney Gates on 11/25/18 at  9:20 AM EDT by an audio-only enabled telemedicine application and verified that I am speaking with the correct person using two identifiers.  The patient and the provider were at separate locations throughout the entire encounter.   I discussed the limitations of evaluation and management by telemedicine and the availability of in person appointments. The patient expressed understanding and agreed to proceed.  History of Present Illness: The patient is a 85 y.o. female with visit for dental problem, fever and stomach pain. She has history of diverticulitis and is not sure if this is starting. She is worried about this. She is also having some dental problems on the right with pain, denies purulent drainage or red gums. The pain radiates into her right ear. Having fevers close to 100 for the last 3 days which is unusual for her. Feeling sickly. Started 2-3 days ago. Has some nausea. Denies diarrhea or constipation. Overall it is not improving. Has tried nothing  Observations/Objective: Voice strong, no coughing or dyspnea during exam, A and O times 3  Assessment and Plan: See problem oriented charting  Follow Up Instructions: covid-19 testing, rx augmentin  Visit time 18 minutes: that time was spent in direct counseling and coordination of care with the patient: counseled about as above  I discussed the assessment and treatment plan with the patient. The patient was provided an opportunity to ask questions and all were answered. The patient agreed with the plan and demonstrated an understanding of the instructions.   The patient was advised to call back or seek an in-person evaluation if the symptoms worsen or if the condition fails to improve as anticipated.  [name], MD

## 2018-11-25 NOTE — Assessment & Plan Note (Signed)
Checking covid-19 testing, rx augmentin to cover mouth and diverticular infection. Depending on results we may need to proceed with labs/imaging or reassessment.

## 2018-11-25 NOTE — Telephone Encounter (Signed)
Spoke to the patient and her son whose main complaint is of persistent constipation. She takes Senokot daily but has to strain and produces only a small about of normal stool. She consistently does not evacuate her bowels and complains of the "muscles not working down there." Eating and drinking normally, denies N/V, reports she "feels like the stool is in a pocket" immediately inside her rectum. Per Dr. Tarri Glenn result note on 05/31/2018, the patient needed to be referred to PT for pelvic floor dyssynergia. The patient and her son are agreeable with this plan of care. Referral sent to PT.  Patient also has had 4 day hx of 99.4-99.6 orally measured temp, she have a telephone visit with her PCP who prophylactically prescribed Augmentin BID x7 days.

## 2018-11-25 NOTE — Telephone Encounter (Signed)
Pt reported that she has had a fever and has not left the house since February.  She stated that her "muscles are not working in that area."  Please advise.

## 2018-11-26 ENCOUNTER — Telehealth: Payer: Self-pay

## 2018-11-26 ENCOUNTER — Telehealth: Payer: Self-pay | Admitting: Gastroenterology

## 2018-11-26 NOTE — Telephone Encounter (Signed)
Thank you. Please arrange a follow-up appointment with me - may be virtual after she starts PT. Thank you.

## 2018-11-26 NOTE — Telephone Encounter (Signed)
Left detailed message to follow up after beginning PT.

## 2018-11-26 NOTE — Telephone Encounter (Signed)
Spoke with pt son [name] and went over pt meds for appt on 11/29/18

## 2018-11-26 NOTE — Telephone Encounter (Signed)
Spoke to patient's son who called to inform Dr. Tarri Glenn that PT reached out and she would start after Labor Day and would call to schedule f/u as recommended by Dr. Tarri Glenn.

## 2018-11-27 LAB — NOVEL CORONAVIRUS, NAA: SARS-CoV-2, NAA: NOT DETECTED

## 2018-11-29 ENCOUNTER — Telehealth (INDEPENDENT_AMBULATORY_CARE_PROVIDER_SITE_OTHER): Payer: Medicare Other | Admitting: Physician Assistant

## 2018-11-29 ENCOUNTER — Encounter: Payer: Self-pay | Admitting: Physician Assistant

## 2018-11-29 ENCOUNTER — Other Ambulatory Visit: Payer: Self-pay

## 2018-11-29 VITALS — BP 135/61 | HR 65 | Ht 66.0 in | Wt 162.0 lb

## 2018-11-29 DIAGNOSIS — R002 Palpitations: Secondary | ICD-10-CM

## 2018-11-29 DIAGNOSIS — I10 Essential (primary) hypertension: Secondary | ICD-10-CM

## 2018-11-29 DIAGNOSIS — R079 Chest pain, unspecified: Secondary | ICD-10-CM

## 2018-11-29 DIAGNOSIS — I350 Nonrheumatic aortic (valve) stenosis: Secondary | ICD-10-CM

## 2018-11-29 NOTE — Patient Instructions (Addendum)
Medication Instructions:  START: Isosorbide (Imdur) 15 mg once a day for 2 weeks If your symptoms improve you can remain on it  If your symptoms do not change you can stop it   If you need a refill on your cardiac medications before your next appointment, please call your pharmacy.   Lab work: None   If you have labs (blood work) drawn today and your tests are completely normal, you will receive your results only by: Marland Kitchen MyChart Message (if you have MyChart) OR . A paper copy in the mail If you have any lab test that is abnormal or we need to change your treatment, we will call you to review the results.  Testing/Procedures: none  Follow-Up: At [facility], you and your health needs are our priority.  As part of our continuing mission to provide you with exceptional heart care, we have created designated Provider Care Teams.  These Care Teams include your primary Cardiologist (physician) and Advanced Practice Providers (APPs -  Physician Assistants and Nurse Practitioners) who all work together to provide you with the care you need, when you need it. You will need a follow up appointment in:  6 months.  Please call our office 2 months in advance to schedule this appointment.  You may see Sherren Mocha, MD or one of the following Advanced Practice Providers on your designated Care Team: (Can be virtual) Richardson Dopp, PA-C [place], [place] . Daune Perch, NP  Any Other Special Instructions Will Be Listed Below (If Applicable).  Follow up with your primary care doctor to see if there is another combination besides Ambien and Robaxin that you can take for your restless leg syndrome

## 2018-11-29 NOTE — Progress Notes (Signed)
Virtual Visit via Telephone Note   This visit type was conducted due to national recommendations for restrictions regarding the COVID-19 Pandemic (e.g. social distancing) in an effort to limit this patient's exposure and mitigate transmission in our community.  Due to her co-morbid illnesses, this patient is at least at moderate risk for complications without adequate follow up.  This format is felt to be most appropriate for this patient at this time.  The patient did not have access to video technology/had technical difficulties with video requiring transitioning to audio format only (telephone).  All issues noted in this document were discussed and addressed.  No physical exam could be performed with this format.  Please refer to the patient's chart for her  consent to telehealth for [facility].   Date:  11/29/2018   ID:  Brittney Gates, DOB 26-Jul-1924, MRN [id number]  Patient Location: Home Provider Location: Home  PCP:  [name], MD  Cardiologist:  Sherren Mocha, MD   Electrophysiologist:  None   Evaluation Performed:  Follow-Up Visit  Chief Complaint: Follow-up on chest pain, palpitations  History of Present Illness:    Brittney Gates is a 85 y.o. female with: - Chest pain, shortness of breath    - Cardiac catheterization 1999 normal    - Myoview 6/19 no ischemia - Aortic stenosis    - Moderate, echo 09/2018: Mean AV the gradient 25 - Peripheral arterial disease - Hypertension - Hyperlipidemia - Hypothyroidism  Ms. Graybeal underwent stress testing in June 2019.  This was negative for ischemia.  Echocardiogram at that time demonstrated normal LV function and moderate aortic stenosis with a mean gradient of 13 mmHg.  She was last seen by Dr. Burt Knack in June 2020 via telemedicine.  She continued to have atypical chest pain and no further testing was recommended at that time.  She was then evaluated by Leanor Kail, PA-C 10/13/2018 for chest pain and palpitations.   Imdur was added to her medical regimen.  An event monitor was obtained as well as an echocardiogram.  Echocardiogram demonstrated normal LV function with moderate aortic stenosis and a mean AV gradient of 25.  Her monitor has not officially been read yet.  I reviewed the study and it demonstrates sinus rhythm with episodes of SVT.  The longest episode was 18.6 seconds.  She had 1 NSVT episode of 5 beats.  She did have PVCs but this only accounted for 1.1% of her ventricular beats.  Today, her son helps with the history.  We reviewed the results of her recent echocardiogram and event monitor.  She decided not to try isosorbide.  She notes occasional palpitations at night.  This seems to occur after taking Ambien and Robaxin for restless legs.  This is more of a sensation that her heart rate is increased.  She also has occasional skipped heartbeats.  She has not really had any sustained tachyarrhythmias.  She has not had syncope, orthopnea or significant leg swelling.  She continues to have occasional heaviness in her chest.  This is nonexertional.  She has no associated symptoms.  She has not had pleuritic chest pain or chest pain with lying supine.  She does not have symptoms related to meals.  The patient does not have symptoms concerning for COVID-19 infection (fever, chills, cough, or new shortness of breath).    Past Medical History:  Diagnosis Date   Acute pancreatitis    ANEMIA    ANXIETY    Arthritis  Chronic anemia    Diverticulosis    Gastritis    Hemorrhoids    Hiatal hernia    HYPERLIPIDEMIA    HYPERTENSION    HYPOTHYROIDISM    LOW BACK PAIN, CHRONIC    PAD (peripheral artery disease) ([place])    Past Surgical History:  Procedure Laterality Date   ANGIOPLASTY  1995   APPENDECTOMY     BACK SURGERY     x's 2   BREAST SURGERY     Benign   FEMUR IM NAIL  01/21/2012   Procedure: INTRAMEDULLARY (IM) NAIL FEMORAL;  Surgeon: Newt Minion, MD;  Location: [place];   Service: Orthopedics;  Laterality: Left;  Intertrochanteric Nail Left Hip   HEMORRHOID SURGERY     IR GENERIC HISTORICAL  05/29/2016   IR SACROPLASTY BILATERAL 05/29/2016 [name], MD [facility]   IR GENERIC HISTORICAL  06/19/2016   IR RADIOLOGIST EVAL & MGMT 06/19/2016 [facility]   TUBAL LIGATION       Current Meds  Medication Sig   amLODipine-benazepril (LOTREL) 5-20 MG capsule TAKE ONE CAPSULE BY MOUTH DAILY   amoxicillin-clavulanate (AUGMENTIN) 875-125 MG tablet Take 1 tablet by mouth 2 (two) times daily.   aspirin EC 81 MG tablet Take 81 mg by mouth daily.   bisacodyl (DULCOLAX) 5 MG EC tablet Take 10 mg by mouth daily as needed for moderate constipation.   Cholecalciferol 2000 UNITS TABS Take 2,000 Units by mouth daily.   HYDROcodone-acetaminophen (NORCO/VICODIN) 5-325 MG tablet Take 1 tablet by mouth every 4 (four) hours as needed for moderate pain.   isosorbide mononitrate (IMDUR) 30 MG 24 hr tablet Take 15 mg by mouth daily.   levothyroxine (SYNTHROID) 100 MCG tablet TAKE ONE TABLET BY MOUTH EVERY MORNING BEFORE BREAKFAST   methocarbamol (ROBAXIN) 500 MG tablet TAKE ONE TABLET BY MOUTH EVERY NIGHT AT BEDTIME AS NEEDED FOR MUSCLE SPASMS   Multiple Vitamin (MULTIVITAMIN WITH MINERALS) TABS tablet Take 1 tablet by mouth daily.   pregabalin (LYRICA) 50 MG capsule TAKE ONE CAPSULE BY MOUTH TWICE A DAY   sennosides-docusate sodium (SENOKOT-S) 8.6-50 MG tablet Take 2 tablets by mouth 2 (two) times daily as needed for constipation.    zolpidem (AMBIEN) 10 MG tablet TAKE ONE TABLET BY MOUTH EVERY NIGHT AT BEDTIME AS NEEDED FOR SLEEP     Allergies:   Statins and Metamucil [psyllium]   Social History   Tobacco Use   Smoking status: Never Smoker   Smokeless tobacco: Never Used  Substance Use Topics   Alcohol use: No    Alcohol/week: 0.0 standard drinks   Drug use: No     Family Hx: The patient's family history includes Gallbladder disease in her  daughter and son; Lung cancer in her father. There is no history of Colon cancer.  ROS:   Please see the history of present illness.    All other systems reviewed and are negative.   Prior CV studies:   The following studies were reviewed today:  Long-term cardiac monitor 10/2018 **Preliminary results** Sinus rhythm, heart rate 44-167; average heart rate 71 NSVT x5 beats SVT-longest duration 18.6 seconds PVCs 1.1%  Echocardiogram 10/26/2018 EF 55-60, mild LVH, grade 1 diastolic dysfunction, severe LAE, mild RAE, mild MAC, moderate aortic stenosis (mean gradient 24.8, peak gradient 37)  Myoview 09/10/2017 EF 45, small fixed mid inferoseptal defect, probably artifact.  Otherwise normal perfusion.  Echocardiogram 09/10/2017 EF 65-79, grade 1 diastolic dysfunction, moderate aortic stenosis (mean 13, peak 24), mild RAE  ABIs  10/2015 Known bilateral SFA disease, left greater than right Right ABI stable and in normal range; left ABI stable and in mid range    Labs/Other Tests and Data Reviewed:    EKG:  No ECG reviewed.  Recent Labs: 05/19/2018: Hemoglobin 11.3; Platelets 156.0; TSH 1.61   Recent Lipid Panel Lab Results  Component Value Date/Time   CHOL 196 10/30/2014 12:08 PM   TRIG 147.0 10/30/2014 12:08 PM   TRIG 120 03/29/2010   HDL 51.00 10/30/2014 12:08 PM   CHOLHDL 4 10/30/2014 12:08 PM   LDLCALC 116 (H) 10/30/2014 12:08 PM   LDLDIRECT 143.1 12/08/2012 12:23 PM    Wt Readings from Last 3 Encounters:  11/29/18 162 lb (73.5 kg)  10/13/18 172 lb 1.9 oz (78.1 kg)  09/10/18 172 lb (78 kg)     Objective:    Vital Signs:  BP 135/61    Pulse 65    Ht _0  (1.676 m)    Wt 162 lb (73.5 kg)    BMI 26.15 kg/m    VITAL SIGNS:  reviewed GEN:  no acute distress RESPIRATORY:  No labored breathing NEURO:  Alert and oriented PSYCH:  Normal mood  ASSESSMENT & PLAN:    1. Chest pain, unspecified type She has a long history of atypical chest pain.  She had a normal heart  catheterization in the late 1990s.  She had a recent nuclear stress test in June 2019 that demonstrated no ischemia.  She has had fairly stable chest discomfort over the past 1 year.  There has not been any significant change.  She did not try isosorbide as was prescribed at her last visit.  I have suggested that she try taking isosorbide 15 mg daily to see if she has any improvement in her symptoms.  If there is no change, she can stop the medication.  Currently, no further work-up is warranted.  2. Palpitations Recent event monitor did demonstrate brief episodes of SVT.  She does describe some rapid heartbeats at night after taking Robaxin and Ambien together for restless legs.  I have asked him to discuss this further with her primary care physician to see if there are any other medication combinations she can try.  She does have baseline bradycardia.  Therefore, I do not think adding a beta-blocker to her medical regimen would be tolerated.  3. Aortic valve stenosis, etiology of cardiac valve disease unspecified Moderate by recent echocardiogram.  She will likely need a repeat echocardiogram in July 2021.  4. Essential hypertension The patient's blood pressure is controlled on her current regimen.  Continue current therapy.   Time:   Today, I have spent 27 minutes with the patient with telehealth technology discussing the above problems.     Medication Adjustments/Labs and Tests Ordered: Current medicines are reviewed at length with the patient today.  Concerns regarding medicines are outlined above.   Tests Ordered: No orders of the defined types were placed in this encounter.   Medication Changes: No orders of the defined types were placed in this encounter.   Follow Up:  Virtual Visit or In Person in 6 month(s)  Signed, Richardson Dopp, PA-C  11/29/2018 4:03 PM    [facility]

## 2018-12-03 ENCOUNTER — Other Ambulatory Visit: Payer: Self-pay | Admitting: Internal Medicine

## 2018-12-03 MED ORDER — HYDROCODONE-ACETAMINOPHEN 5-325 MG PO TABS: 1.0000 | ORAL_TABLET | ORAL | 0 refills | Status: DC | PRN

## 2018-12-03 NOTE — Telephone Encounter (Signed)
Medication Refill - Medication: HYDROcodone-acetaminophen (NORCO/VICODIN) 5-325 MG tablet/Pt stated she is having lower back pain and would like for Dr. Sharlet Salina to refill hydrocodone.  Has the patient contacted their pharmacy? No. (Agent: If no, request that the patient contact the pharmacy for the refill.) (Agent: If yes, when and what did the pharmacy advise?)  Preferred Pharmacy (with phone number or street name): [place], [place] [phone] (Phone) [phone] (Fax)     Agent: Please be advised that RX refills may take up to 3 business days. We ask that you follow-up with your pharmacy.

## 2018-12-03 NOTE — Telephone Encounter (Signed)
Control database checked last refill: 09/09/2018  30 tabs [id number] 11/25/2018, 09/06/2018 osteoarthritis  NOV: none

## 2018-12-03 NOTE — Telephone Encounter (Signed)
Requested medication (s) are due for refill today: yes  Requested medication (s) are on the active medication list: yes  Last refill: 09/06/2018  Future visit scheduled: no  Notes to clinic:  This refill cannot be delegated  Requested Prescriptions  Pending Prescriptions Disp Refills   HYDROcodone-acetaminophen (NORCO/VICODIN) 5-325 MG tablet 30 tablet 0    Sig: Take 1 tablet by mouth every 4 (four) hours as needed for moderate pain.     Not Delegated - Analgesics:  Opioid Agonist Combinations Failed - 12/03/2018  8:31 AM      Failed - This refill cannot be delegated      Failed - Urine Drug Screen completed in last 360 days.      Passed - Valid encounter within last 6 months    Recent Outpatient Visits          1 week ago Fever, unspecified fever cause   [place], [place], MD   2 months ago Hand numbness   [place], Elizabeth A, MD   1 year ago Lumbar back pain with radiculopathy affecting right lower extremity   [facility] -Chuck Hint, MD   1 year ago [facility] -Chuck Hint, MD   1 year ago Right hip pain   [facility] Rosemarie Ax, MD

## 2018-12-10 ENCOUNTER — Telehealth: Payer: Self-pay | Admitting: Gastroenterology

## 2018-12-12 NOTE — Telephone Encounter (Signed)
Thank you for the followup.

## 2018-12-29 ENCOUNTER — Other Ambulatory Visit: Payer: Self-pay | Admitting: Internal Medicine

## 2019-01-03 ENCOUNTER — Other Ambulatory Visit: Payer: Self-pay | Admitting: Internal Medicine

## 2019-01-17 ENCOUNTER — Other Ambulatory Visit: Payer: Self-pay | Admitting: Internal Medicine

## 2019-01-17 NOTE — Telephone Encounter (Signed)
Requested medication (s) are due for refill today: yes  Requested medication (s) are on the active medication list: yes  Last refill:  12/03/2018  Future visit scheduled: no  Notes to clinic:  Refill cannot be delegated    Requested Prescriptions  Pending Prescriptions Disp Refills   HYDROcodone-acetaminophen (NORCO/VICODIN) 5-325 MG tablet 30 tablet 0    Sig: Take 1 tablet by mouth every 4 (four) hours as needed for moderate pain.     Not Delegated - Analgesics:  Opioid Agonist Combinations Failed - 01/17/2019  8:35 AM      Failed - This refill cannot be delegated      Failed - Urine Drug Screen completed in last 360 days.      Passed - Valid encounter within last 6 months    Recent Outpatient Visits          1 month ago Fever, unspecified fever cause   [place], [place], MD   4 months ago Hand numbness   [facility] -Chuck Hint, MD   1 year ago Lumbar back pain with radiculopathy affecting right lower extremity   [facility] -Chuck Hint, MD   1 year ago [facility] -Chuck Hint, MD   1 year ago Right hip pain   [facility] Rosemarie Ax, MD

## 2019-01-17 NOTE — Telephone Encounter (Signed)
Control database checked last refill: 12/10/2018  30 tabs LOV: acute 11/25/2018 [id number]

## 2019-01-17 NOTE — Telephone Encounter (Signed)
Routing to CMA 

## 2019-01-17 NOTE — Telephone Encounter (Signed)
Medication: HYDROcodone-acetaminophen (NORCO/VICODIN) 5-325 MG tablet [id number]   Has the patient contacted their pharmacy? Yes  (Agent: If no, request that the patient contact the pharmacy for the refill.) (Agent: If yes, when and what did the pharmacy advise?)  Preferred Pharmacy (with phone number or street name): [place], [place] [phone] (Phone) [phone] (Fax)    Agent: Please be advised that RX refills may take up to 3 business days. We ask that you follow-up with your pharmacy.

## 2019-01-18 NOTE — Telephone Encounter (Signed)
Called and talked to patient and her son and she has 12 and half pills left. Patient been having some feet pain more often at night. Only will take 1 a day if that, always starts with a half a pill. Has been taking a little more recently than usual because of the recent more intense feet pain. States that it was filled September fourth not the eleventh. Informed to call us when she gets down to the last couple of pills. They stated understanding.

## 2019-01-18 NOTE — Telephone Encounter (Signed)
This amount of medication typically lasts her 3-6 months. Has she been having a change in symptoms? She is supposed to be using this rarely only.

## 2019-01-28 ENCOUNTER — Other Ambulatory Visit: Payer: Self-pay | Admitting: Internal Medicine

## 2019-01-28 NOTE — Telephone Encounter (Signed)
Control database checked last refill: 10/26/2018  90 tabs LOV: acute 11/25/2018 and 09/06/2018 [id number]

## 2019-02-15 ENCOUNTER — Other Ambulatory Visit: Payer: Self-pay | Admitting: Internal Medicine

## 2019-02-15 NOTE — Telephone Encounter (Signed)
Medication Refill - Medication: HYDROcodone-acetaminophen (NORCO/VICODIN) 5-325 MG tablet   Has the patient contacted their pharmacy? Yes.   (Agent: If no, request that the patient contact the pharmacy for the refill.) (Agent: If yes, when and what did the pharmacy advise?)  Preferred Pharmacy (with phone number or street name):  [place], [place]  [place] [address]  Phone: [phone] Fax: [phone]     Agent: Please be advised that RX refills may take up to 3 business days. We ask that you follow-up with your pharmacy.

## 2019-02-15 NOTE — Telephone Encounter (Signed)
Requested medication (s) are due for refill today:  yes  Requested medication (s) are on the active medication list:  yes  Future visit scheduled:  no  Last Refill:  12/03/18; #30; no refills   Requested Prescriptions  Pending Prescriptions Disp Refills   HYDROcodone-acetaminophen (NORCO/VICODIN) 5-325 MG tablet 30 tablet 0    Sig: Take 1 tablet by mouth every 4 (four) hours as needed for moderate pain.     Not Delegated - Analgesics:  Opioid Agonist Combinations Failed - 02/15/2019 11:22 AM      Failed - This refill cannot be delegated      Failed - Urine Drug Screen completed in last 360 days.      Passed - Valid encounter within last 6 months    Recent Outpatient Visits          2 months ago Fever, unspecified fever cause   [place], MD   5 months ago Hand numbness   [facility] -Chuck Hint, MD   1 year ago Lumbar back pain with radiculopathy affecting right lower extremity   [facility] -Chuck Hint, MD   1 year ago [facility] -Chuck Hint, MD   1 year ago Right hip pain   [facility] Rosemarie Ax, MD

## 2019-02-16 MED ORDER — HYDROCODONE-ACETAMINOPHEN 5-325 MG PO TABS: 1.0000 | ORAL_TABLET | ORAL | 0 refills | Status: DC | PRN

## 2019-02-16 NOTE — Telephone Encounter (Signed)
Control database checked last refill: 12/10/2018 30 tabs LOV: 09/06/2018 for pain, acute 11/25/2018 NOV: none

## 2019-04-03 ENCOUNTER — Other Ambulatory Visit: Payer: Self-pay | Admitting: Internal Medicine

## 2019-04-03 DIAGNOSIS — E039 Hypothyroidism, unspecified: Secondary | ICD-10-CM

## 2019-04-05 ENCOUNTER — Other Ambulatory Visit: Payer: Self-pay | Admitting: Internal Medicine

## 2019-04-05 DIAGNOSIS — E039 Hypothyroidism, unspecified: Secondary | ICD-10-CM

## 2019-04-07 ENCOUNTER — Ambulatory Visit (INDEPENDENT_AMBULATORY_CARE_PROVIDER_SITE_OTHER): Payer: Medicare Other | Admitting: Internal Medicine

## 2019-04-07 ENCOUNTER — Encounter: Payer: Self-pay | Admitting: Internal Medicine

## 2019-04-07 DIAGNOSIS — M8949 Other hypertrophic osteoarthropathy, multiple sites: Secondary | ICD-10-CM | POA: Diagnosis not present

## 2019-04-07 DIAGNOSIS — M159 Polyosteoarthritis, unspecified: Secondary | ICD-10-CM

## 2019-04-07 NOTE — Progress Notes (Signed)
Virtual Visit via Audio Note  I connected with Brittney Gates on 04/07/19 at  1:20 PM EST by an audio-only enabled telemedicine application and verified that I am speaking with the correct person using two identifiers.  The patient and the provider were at separate locations throughout the entire encounter.   I discussed the limitations of evaluation and management by telemedicine and the availability of in person appointments. The patient expressed understanding and agreed to proceed. The patient and the provider were the only parties present for the visit unless noted in HPI below.  History of Present Illness: The patient is a 86 y.o. female with visit for follow up. She is having worsening pain in her joints mostly in the back and neck. She uses rare hydrocodone for severe pain. She tries to take otc medications first. She has seen orthopedics in the past to help without much relief.   Observations/Objective: Voice strong, no coughing or dyspnea during visit, A and O times 3  Assessment and Plan: See problem oriented charting  Follow Up Instructions: refill hydrocodone if needed or other meds prior to next visit  Visit time 13 minutes in non-face to face communication with patient and coordination of care.  I discussed the assessment and treatment plan with the patient. The patient was provided an opportunity to ask questions and all were answered. The patient agreed with the plan and demonstrated an understanding of the instructions.   The patient was advised to call back or seek an in-person evaluation if the symptoms worsen or if the condition fails to improve as anticipated.  [name], MD

## 2019-04-07 NOTE — Assessment & Plan Note (Signed)
Worsening throughout the years. Using lyrica, hydrocodone when needed as well as otc medications. Can refill hydrocodone when needed prior to next visit.

## 2019-05-01 ENCOUNTER — Other Ambulatory Visit: Payer: Self-pay | Admitting: Internal Medicine

## 2019-05-02 NOTE — Telephone Encounter (Signed)
Filled 01/28/2019 Zolpidem Tartrate 10 Mg Tablet 90 #  Last ov 04/07/19 Next ov n/s

## 2019-06-01 ENCOUNTER — Ambulatory Visit: Payer: Medicare Other | Admitting: Physician Assistant

## 2019-06-14 ENCOUNTER — Other Ambulatory Visit: Payer: Self-pay

## 2019-06-14 NOTE — Telephone Encounter (Signed)
Medication Refill - Medication: HYDROcodone-acetaminophen (NORCO/VICODIN) 5-325 MG tablet   Has the patient contacted their pharmacy? Yes.   (Agent: If no, request that the patient contact the pharmacy for the refill.) (Agent: If yes, when and what did the pharmacy advise?)  Preferred Pharmacy (with phone number or street name): [place], [place]  Agent: Please be advised that RX refills may take up to 3 business days. We ask that you follow-up with your pharmacy.

## 2019-06-15 NOTE — Telephone Encounter (Signed)
Hydrocodone-Acetamin 5-325 Mg  Checked controlled substance database. Last filled: 02/16/2019 Last office visit: 04/07/2019 Next Office visit: N/S

## 2019-06-16 MED ORDER — HYDROCODONE-ACETAMINOPHEN 5-325 MG PO TABS: 1.0000 | ORAL_TABLET | ORAL | 0 refills | Status: DC | PRN

## 2019-06-16 NOTE — Telephone Encounter (Signed)
Script sent in today from Dr. Sharlet Salina.

## 2019-06-24 ENCOUNTER — Ambulatory Visit: Payer: Medicare Other | Admitting: Physician Assistant

## 2019-06-28 ENCOUNTER — Other Ambulatory Visit: Payer: Self-pay

## 2019-06-28 ENCOUNTER — Encounter: Payer: Self-pay | Admitting: Physician Assistant

## 2019-06-28 ENCOUNTER — Encounter (INDEPENDENT_AMBULATORY_CARE_PROVIDER_SITE_OTHER): Payer: Self-pay

## 2019-06-28 ENCOUNTER — Ambulatory Visit (INDEPENDENT_AMBULATORY_CARE_PROVIDER_SITE_OTHER): Payer: Medicare Other | Admitting: Physician Assistant

## 2019-06-28 DIAGNOSIS — D0430 Carcinoma in situ of skin of unspecified part of face: Secondary | ICD-10-CM

## 2019-06-28 DIAGNOSIS — D0439 Carcinoma in situ of skin of other parts of face: Secondary | ICD-10-CM | POA: Diagnosis not present

## 2019-06-28 DIAGNOSIS — L570 Actinic keratosis: Secondary | ICD-10-CM

## 2019-06-28 DIAGNOSIS — D492 Neoplasm of unspecified behavior of bone, soft tissue, and skin: Secondary | ICD-10-CM

## 2019-06-28 NOTE — Patient Instructions (Signed)

## 2019-06-28 NOTE — Progress Notes (Addendum)
   Follow-Up Visit   Subjective  Brittney Gates is a 86 y.o. female who presents for the following: Skin Problem (Non healing place on right posterior neck x2 years.  Sore at times with discomfort.). Pink, itches   Location: Right side face Duration: 2 years Quality: pink and scaly Associated Signs/Symptoms:sore Modifying Factors: picks  The following portions of the chart were reviewed this encounter and updated as appropriate: Tobacco  Allergies  Meds  Problems  Med Hx  Surg Hx  Fam Hx      Objective  Well appearing patient in no apparent distress; mood and affect are within normal limits.  All skin waist up examined.  Objective  Left Forehead, Philtrum: Erythematous patches with gritty scale.  Objective  Right Parotid Area: Hyperkeratotic scale with pink base      Assessment & Plan  AK (actinic keratosis) (2) Philtrum; Left Forehead  Destruction of lesion - Left Forehead, Philtrum Complexity: simple   Destruction method: cryotherapy   Informed consent: discussed and consent obtained   Timeout:  patient name, date of birth, surgical site, and procedure verified Lesion destroyed using liquid nitrogen: Yes   Cryotherapy cycles:  1 Outcome: patient tolerated procedure well with no complications   Post-procedure details: wound care instructions given    Carcinoma in situ of skin of face, unspecified location Right Parotid Area  Skin / nail biopsy Type of biopsy: tangential   Informed consent: discussed and consent obtained   Timeout: patient name, date of birth, surgical site, and procedure verified   Procedure prep:  Patient was prepped and draped in usual sterile fashion Prep type:  Chlorhexidine Anesthesia: the lesion was anesthetized in a standard fashion   Anesthetic:  1% lidocaine w/ epinephrine 1-100,000 local infiltration Instrument used: flexible razor blade   Hemostasis achieved with: aluminum chloride   Outcome: patient tolerated procedure  well   Post-procedure details: wound care instructions given   Additional details:  PERFORMED ED&C AFTER BIOPSY  TX p BX - 1.1cm  Destruction of lesion Complexity: extensive   Destruction method: electrodesiccation and curettage   Informed consent: discussed and consent obtained   Timeout:  patient name, date of birth, surgical site, and procedure verified Procedure prep:  Patient was prepped and draped in usual sterile fashion Prep type:  Isopropyl alcohol Anesthesia: the lesion was anesthetized in a standard fashion   Anesthetic:  1% lidocaine w/ epinephrine 1-100,000 buffered w/ 8.4% NaHCO3 Curettage performed in three different directions: Yes   Electrodesiccation performed over the curetted area: Yes   Lesion length (cm):  1.1 Lesion width (cm):  1.1 Margin per side (cm):  0.1 Final wound size (cm):  1.3 Hemostasis achieved with:  pressure, aluminum chloride and electrodesiccation Outcome: patient tolerated procedure well with no complications   Post-procedure details: sterile dressing applied and wound care instructions given   Dressing type: bandage and petrolatum    Specimen 1 - Surgical pathology Differential Diagnosis: scc Check Margins: No

## 2019-06-30 ENCOUNTER — Telehealth: Payer: Self-pay | Admitting: *Deleted

## 2019-06-30 NOTE — Telephone Encounter (Signed)
-----   Message from Warren Danes, [place] sent at 06/30/2019  4:27 PM EDT ----- TxPbx rtc if recurs

## 2019-06-30 NOTE — Telephone Encounter (Signed)
Pathology report to patient. Told patient to call if spot recurs.

## 2019-07-03 ENCOUNTER — Other Ambulatory Visit: Payer: Self-pay | Admitting: Internal Medicine

## 2019-07-03 DIAGNOSIS — E039 Hypothyroidism, unspecified: Secondary | ICD-10-CM

## 2019-07-06 ENCOUNTER — Telehealth (INDEPENDENT_AMBULATORY_CARE_PROVIDER_SITE_OTHER): Payer: Medicare Other | Admitting: Physician Assistant

## 2019-07-06 ENCOUNTER — Encounter: Payer: Self-pay | Admitting: Physician Assistant

## 2019-07-06 ENCOUNTER — Other Ambulatory Visit: Payer: Self-pay

## 2019-07-06 VITALS — Ht 66.0 in | Wt 168.0 lb

## 2019-07-06 DIAGNOSIS — I350 Nonrheumatic aortic (valve) stenosis: Secondary | ICD-10-CM

## 2019-07-06 NOTE — Progress Notes (Signed)
Virtual Visit via Telephone Note   This visit type was conducted due to national recommendations for restrictions regarding the COVID-19 Pandemic (e.g. social distancing) in an effort to limit this patient's exposure and mitigate transmission in our community.  Due to her co-morbid illnesses, this patient is at least at moderate risk for complications without adequate follow up.  This format is felt to be most appropriate for this patient at this time.  The patient did not have access to video technology/had technical difficulties with video requiring transitioning to audio format only (telephone).  All issues noted in this document were discussed and addressed.  No physical exam could be performed with this format.  Please refer to the patient's chart for her  consent to telehealth for [facility].   The patient was identified using 2 identifiers.  Date:  07/06/2019   ID:  [name], DOB 1924/10/06, MRN [id number]  Patient Location: Home Provider Location: Home  PCP:  [name], MD  Cardiologist:  Sherren Mocha, MD   Electrophysiologist:  None   Evaluation Performed:  Follow-Up Visit  Chief Complaint:  Chest pain, palpitations, aortic stenosis   Patient Profile: AYSHA LIVECCHI is a 86 y.o. female with  Chest pain, shortness of breath  Cardiac catheterization 1999 normal  Myoview 6/19- no ischemia  Aortic stenosis  Moderate, echo 09/2018: Mean AV the gradient 25  Palpitations  Event monitor 10/2018: Occ PVCs, short SVT, rare short NSVT, no pauses or AFib  Peripheral arterial disease  Hypertension  Hyperlipidemia  Hypothyroidism  Prior CV Studies: Long-term cardiac monitor 10/2018 NSR, avg HR 71 Occ PVCs, ventricular couplets, rare vent run (5 beats) No sig bradycardia, pathologic pauses Occ supraventricular ectopics, short paroxysms of SVT No AFib, Flutter  Echocardiogram 10/26/2018 EF 55-60, mild LVH, grade 1 diastolic dysfunction, severe LAE,  mild RAE, mild MAC, moderate aortic stenosis (mean gradient 24.8, peak gradient 37)  Myoview 09/10/2017 EF 45, small fixed mid inferoseptal defect, probably artifact.  Otherwise normal perfusion.  Echocardiogram 09/10/2017 EF 68-08, grade 1 diastolic dysfunction, moderate aortic stenosis (mean 13, peak 24), mild RAE  ABIs 10/2015 Known bilateral SFA disease, left greater than right Right ABI stable and in normal range; left ABI stable and in mid range  History of Present Illness:    Ms. Tiznado was last seen in 10/2018 via Telemedicine.  A Myoview in 08/2017 showed normal perfusion.  She had been evaluated by Leanor Kail, PA-C for chest pain and palpitations in July 2020.  Isosorbide was prescribed but the patient never tried this.  An echocardiogram demonstrated normal LVF and mod aortic stenosis.  An event monitor showed short bursts of SVT.  Given her baseline bradycardia, I did not start her on beta-blocker Rx.  I did encourage her to try the Isosorbide to see if it would help her symptoms.    Today, she notes she is doing well.  She has not had significant shortness of breath or chest pain. She has not had syncope, orthopnea, leg swelling.  She has neuropathy that causes leg burning.    Past Medical History:  Diagnosis Date  . Acute pancreatitis   . ANEMIA   . ANXIETY   . Arthritis   . Chronic anemia   . Diverticulosis   . Gastritis   . Hemorrhoids   . Hiatal hernia   . HYPERLIPIDEMIA   . HYPERTENSION   . HYPOTHYROIDISM   . LOW BACK PAIN, CHRONIC   . PAD (peripheral artery disease) (  [facility])    Past Surgical History:  Procedure Laterality Date  . ANGIOPLASTY  1995  . APPENDECTOMY    . BACK SURGERY     x's 2  . BREAST SURGERY     Benign  . FEMUR IM NAIL  01/21/2012   Procedure: INTRAMEDULLARY (IM) NAIL FEMORAL;  Surgeon: Newt Minion, MD;  Location: [place];  Service: Orthopedics;  Laterality: Left;  Intertrochanteric Nail Left Hip  . HEMORRHOID SURGERY    . IR  GENERIC HISTORICAL  05/29/2016   IR SACROPLASTY BILATERAL 05/29/2016 [name], MD [facility]-INTERV RAD  . IR GENERIC HISTORICAL  06/19/2016   IR RADIOLOGIST EVAL & MGMT 06/19/2016 [facility]  . TUBAL LIGATION       Current Meds  Medication Sig  . amLODipine-benazepril (LOTREL) 5-20 MG capsule TAKE ONE CAPSULE BY MOUTH DAILY  . aspirin EC 81 MG tablet Take 81 mg by mouth daily.  . Cholecalciferol 2000 UNITS TABS Take 2,000 Units by mouth daily.  Marland Kitchen HYDROcodone-acetaminophen (NORCO/VICODIN) 5-325 MG tablet Take 1 tablet by mouth every 4 (four) hours as needed for moderate pain.  Marland Kitchen levothyroxine (SYNTHROID) 100 MCG tablet TAKE ONE TABLET BY MOUTH EVERY MORNING BEFORE BREAKFAST  . Multiple Vitamin (MULTIVITAMIN WITH MINERALS) TABS tablet Take 1 tablet by mouth daily.  . pregabalin (LYRICA) 50 MG capsule TAKE ONE CAPSULE BY MOUTH TWICE A DAY  . sennosides-docusate sodium (SENOKOT-S) 8.6-50 MG tablet Take 2 tablets by mouth 2 (two) times daily as needed for constipation.   Marland Kitchen zolpidem (AMBIEN) 10 MG tablet TAKE ONE TABLET BY MOUTH EVERY NIGHT AT BEDTIME AS NEEDED FOR SLEEP     Allergies:   Statins, Lidocaine, and Metamucil [psyllium]   Social History   Tobacco Use  . Smoking status: Never Smoker  . Smokeless tobacco: Never Used  Substance Use Topics  . Alcohol use: No    Alcohol/week: 0.0 standard drinks  . Drug use: No     Family Hx: The patient's family history includes Gallbladder disease in her daughter and son; Lung cancer in her father. There is no history of Colon cancer.  ROS:   Please see the history of present illness.     Labs/Other Tests and Data Reviewed:    EKG:  No ECG reviewed.  Recent Labs: No results found for requested labs within last 8760 hours.   Recent Lipid Panel Lab Results  Component Value Date/Time   CHOL 196 10/30/2014 12:08 PM   TRIG 147.0 10/30/2014 12:08 PM   TRIG 120 03/29/2010 12:00 AM   HDL 51.00 10/30/2014 12:08 PM   CHOLHDL 4 10/30/2014  12:08 PM   LDLCALC 116 (H) 10/30/2014 12:08 PM   LDLDIRECT 143.1 12/08/2012 12:23 PM    Wt Readings from Last 3 Encounters:  07/06/19 168 lb (76.2 kg)  11/29/18 162 lb (73.5 kg)  10/13/18 172 lb 1.9 oz (78.1 kg)     Objective:    Vital Signs:  Ht _0  (1.676 m)   Wt 168 lb (76.2 kg)   BMI 27.12 kg/m    VITAL SIGNS:  reviewed GEN:  no acute distress RESPIRATORY:  No labored breathing NEURO:  Alert and oriented PSYCH:  normal affect  ASSESSMENT & PLAN:    1. Nonrheumatic aortic valve stenosis Moderate aortic stenosis by echocardiogram July 2020 with mean gradient 25 mmHg.  She is doing well without symptoms to suggest significant worsening.  She has not really had significant chest discomfort or shortness of breath since her last virtual  visit.  Plan follow-up in 1 year.  We will get a repeat echocardiogram in August 2021.  Time:   Today, I have spent 8 minutes with the patient with telehealth technology discussing the above problems.     Medication Adjustments/Labs and Tests Ordered: Current medicines are reviewed at length with the patient today.  Concerns regarding medicines are outlined above.   Tests Ordered: Orders Placed This Encounter  Procedures  . ECHOCARDIOGRAM COMPLETE    Medication Changes: No orders of the defined types were placed in this encounter.   Follow Up:  In Person in 1 year(s)  Signed, Richardson Dopp, PA-C  07/06/2019 5:26 PM    [place]

## 2019-07-06 NOTE — Patient Instructions (Signed)
Medication Instructions:   Your physician recommends that you continue on your current medications as directed. Please refer to the Current Medication list given to you today.  *If you need a refill on your cardiac medications before your next appointment, please call your pharmacy*  Lab Work:  None ordered today  Testing/Procedures:  Your physician has requested that you have an echocardiogram in August 2021. Echocardiography is a painless test that uses sound waves to create images of your heart. It provides your doctor with information about the size and shape of your heart and how well your heart's chambers and valves are working. This procedure takes approximately one hour. There are no restrictions for this procedure.  Follow-Up: At [facility], you and your health needs are our priority.  As part of our continuing mission to provide you with exceptional heart care, we have created designated Provider Care Teams.  These Care Teams include your primary Cardiologist (physician) and Advanced Practice Providers (APPs -  Physician Assistants and Nurse Practitioners) who all work together to provide you with the care you need, when you need it.  We recommend signing up for the patient portal called "MyChart".  Sign up information is provided on this After Visit Summary.  MyChart is used to connect with patients for Virtual Visits (Telemedicine).  Patients are able to view lab/test results, encounter notes, upcoming appointments, etc.  Non-urgent messages can be sent to your provider as well.   To learn more about what you can do with MyChart, go to [email or web address].    Your next appointment:   12 month(s)  The format for your next appointment:   In Person  Provider:    You may see Sherren Mocha, MD or Richardson Dopp, PA-C

## 2019-07-21 ENCOUNTER — Other Ambulatory Visit: Payer: Self-pay

## 2019-07-21 ENCOUNTER — Ambulatory Visit (HOSPITAL_COMMUNITY): Payer: Medicare Other | Attending: Cardiovascular Disease

## 2019-07-21 DIAGNOSIS — I350 Nonrheumatic aortic (valve) stenosis: Secondary | ICD-10-CM | POA: Diagnosis not present

## 2019-07-22 ENCOUNTER — Encounter: Payer: Self-pay | Admitting: Physician Assistant

## 2019-07-28 ENCOUNTER — Other Ambulatory Visit: Payer: Self-pay | Admitting: Internal Medicine

## 2019-07-29 NOTE — Telephone Encounter (Signed)
Check [place] registry last filled 05/03/2019.Marland KitchenJohny Gates

## 2019-08-02 DIAGNOSIS — H524 Presbyopia: Secondary | ICD-10-CM | POA: Diagnosis not present

## 2019-08-02 DIAGNOSIS — H52223 Regular astigmatism, bilateral: Secondary | ICD-10-CM | POA: Diagnosis not present

## 2019-08-02 DIAGNOSIS — H353121 Nonexudative age-related macular degeneration, left eye, early dry stage: Secondary | ICD-10-CM | POA: Diagnosis not present

## 2019-08-02 DIAGNOSIS — H353112 Nonexudative age-related macular degeneration, right eye, intermediate dry stage: Secondary | ICD-10-CM | POA: Diagnosis not present

## 2019-08-02 DIAGNOSIS — H04123 Dry eye syndrome of bilateral lacrimal glands: Secondary | ICD-10-CM | POA: Diagnosis not present

## 2019-08-26 ENCOUNTER — Encounter: Payer: Self-pay | Admitting: Physician Assistant

## 2019-08-26 ENCOUNTER — Ambulatory Visit (INDEPENDENT_AMBULATORY_CARE_PROVIDER_SITE_OTHER): Payer: Medicare Other | Admitting: Physician Assistant

## 2019-08-26 ENCOUNTER — Other Ambulatory Visit: Payer: Self-pay

## 2019-08-26 DIAGNOSIS — Z85828 Personal history of other malignant neoplasm of skin: Secondary | ICD-10-CM | POA: Diagnosis not present

## 2019-08-26 DIAGNOSIS — L814 Other melanin hyperpigmentation: Secondary | ICD-10-CM

## 2019-08-26 DIAGNOSIS — D1800 Hemangioma unspecified site: Secondary | ICD-10-CM | POA: Diagnosis not present

## 2019-08-26 DIAGNOSIS — L820 Inflamed seborrheic keratosis: Secondary | ICD-10-CM

## 2019-08-26 DIAGNOSIS — L578 Other skin changes due to chronic exposure to nonionizing radiation: Secondary | ICD-10-CM

## 2019-08-26 DIAGNOSIS — L821 Other seborrheic keratosis: Secondary | ICD-10-CM | POA: Diagnosis not present

## 2019-08-26 DIAGNOSIS — D485 Neoplasm of uncertain behavior of skin: Secondary | ICD-10-CM

## 2019-08-26 DIAGNOSIS — Z1283 Encounter for screening for malignant neoplasm of skin: Secondary | ICD-10-CM

## 2019-08-26 DIAGNOSIS — D229 Melanocytic nevi, unspecified: Secondary | ICD-10-CM

## 2019-08-26 NOTE — Progress Notes (Addendum)
   Follow-Up Visit   Subjective  Brittney Gates is a 86 y.o. female who presents for the following: Skin Problem (Check patients face. Prev [address] for dark spots. ).   The following portions of the chart were reviewed this encounter and updated as appropriate: Tobacco  Allergies  Meds  Problems  Med Hx  Surg Hx  Fam Hx      Objective  Well appearing patient in no apparent distress; mood and affect are within normal limits.  A focused examination was performed including face. Relevant physical exam findings are noted in the Assessment and Plan.  Objective  Left Buccal Cheek  (2), Left Forehead, Left Parotid Area, Left Temple, Left Temporal Scalp, Mid Forehead, Mid Frontal Scalp, Right Buccal Cheek  (2), Right Frontal Scalp, Right Parotid Area, Right Temple, Right Zygomatic Area: Erythematous stuck-on, waxy papule or plaque.   Objective  Right Parotid Area: Scar pink  Objective  face and neck: No signs of NMSC  Assessment & Plan  Inflamed seborrheic keratosis (14) Right Frontal Scalp; Mid Frontal Scalp; Mid Forehead; Left Temple; Right Temple; Right Zygomatic Area; Left Parotid Area; Right Parotid Area; Left Buccal Cheek  (2); Right Buccal Cheek  (2); Left Forehead; Left Temporal Scalp  Destruction of lesion - Left Buccal Cheek  (2), Left Forehead, Left Parotid Area, Left Temple, Left Temporal Scalp, Mid Forehead, Mid Frontal Scalp, Right Buccal Cheek  (2), Right Frontal Scalp, Right Parotid Area, Right Temple, Right Zygomatic Area Complexity: simple   Destruction method: cryotherapy   Informed consent: discussed and consent obtained   Timeout:  patient name, date of birth, surgical site, and procedure verified Lesion destroyed using liquid nitrogen: Yes   Cryotherapy cycles:  1 Outcome: patient tolerated procedure well with no complications   Post-procedure details: wound care instructions given    Lentigines - Scattered tan macules - Discussed due to sun exposure -  Benign, observe - Call for any changes  Seborrheic Keratoses - Stuck-on, waxy, tan-brown papules and plaques  - Discussed benign etiology and prognosis. - Observe - Call for any changes  Melanocytic Nevi - Tan-brown and/or pink-flesh-colored symmetric macules and papules - Benign appearing on exam today - Observation - Call clinic for new or changing moles - Recommend daily use of broad spectrum spf 30+ sunscreen to sun-exposed areas.   Hemangiomas - Red papules - Discussed benign nature - Observe - Call for any changes  Actinic Damage - diffuse scaly erythematous macules with underlying dyspigmentation - Recommend daily broad spectrum sunscreen SPF 30+ to sun-exposed areas, reapply every 2 hours as needed.  - Call for new or changing lesions.  Skin cancer screening performed today. I, Alexzandrea Normington, PA-C, have reviewed all documentation for this visit. The documentation on 11/08/19 for the exam, diagnosis, procedures, and orders are all accurate and complete.

## 2019-08-30 ENCOUNTER — Ambulatory Visit: Payer: Medicare Other | Admitting: Internal Medicine

## 2019-08-30 NOTE — Addendum Note (Signed)
Addended by: Robyne Askew R on: 08/30/2019 12:23 PM   Modules accepted: Level of Service

## 2019-09-06 ENCOUNTER — Ambulatory Visit (INDEPENDENT_AMBULATORY_CARE_PROVIDER_SITE_OTHER): Payer: Medicare Other

## 2019-09-06 DIAGNOSIS — Z0000 Encounter for general adult medical examination without abnormal findings: Secondary | ICD-10-CM | POA: Diagnosis not present

## 2019-09-06 NOTE — Patient Instructions (Signed)
Brittney Gates , Thank you for taking time to come for your Medicare Wellness Visit. I appreciate your ongoing commitment to your health goals. Please review the following plan we discussed and let me know if I can assist you in the future.   Screening recommendations/referrals: Colonoscopy: not recommended due to age 86: not recommended due to age Bone Density: last done on 10/30/2014 Recommended yearly ophthalmology/optometry visit for glaucoma screening and checkup Recommended yearly dental visit for hygiene and checkup  Vaccinations: Influenza vaccine: 11/14/2018 Pneumococcal vaccine: completed Tdap vaccine: 12/30/2013; due every 10 years Shingles vaccine: never done; will talk with Dr. Sharlet Salina at next visit   Covid-19: completed  Advanced directives: Yes  Conditions/risks identified: Yes  Next appointment: Please schedule your 1 year Medicare Wellness Visit with your Health Coach.  Preventive Care 4 Years and Older, Female Preventive care refers to lifestyle choices and visits with your health care provider that can promote health and wellness. What does preventive care include?  A yearly physical exam. This is also called an annual well check.  Dental exams once or twice a year.  Routine eye exams. Ask your health care provider how often you should have your eyes checked.  Personal lifestyle choices, including:  Daily care of your teeth and gums.  Regular physical activity.  Eating a healthy diet.  Avoiding tobacco and drug use.  Limiting alcohol use.  Practicing safe sex.  Taking low-dose aspirin every day.  Taking vitamin and mineral supplements as recommended by your health care provider. What happens during an annual well check? The services and screenings done by your health care provider during your annual well check will depend on your age, overall health, lifestyle risk factors, and family history of disease. Counseling  Your health care provider  may ask you questions about your:  Alcohol use.  Tobacco use.  Drug use.  Emotional well-being.  Home and relationship well-being.  Sexual activity.  Eating habits.  History of falls.  Memory and ability to understand (cognition).  Work and work Statistician.  Reproductive health. Screening  You may have the following tests or measurements:  Height, weight, and BMI.  Blood pressure.  Lipid and cholesterol levels. These may be checked every 5 years, or more frequently if you are over 73 years old.  Skin check.  Lung cancer screening. You may have this screening every year starting at age 82 if you have a 30-pack-year history of smoking and currently smoke or have quit within the past 15 years.  Fecal occult blood test (FOBT) of the stool. You may have this test every year starting at age 13.  Flexible sigmoidoscopy or colonoscopy. You may have a sigmoidoscopy every 5 years or a colonoscopy every 10 years starting at age 25.  Hepatitis C blood test.  Hepatitis B blood test.  Sexually transmitted disease (STD) testing.  Diabetes screening. This is done by checking your blood sugar (glucose) after you have not eaten for a while (fasting). You may have this done every 1-3 years.  Bone density scan. This is done to screen for osteoporosis. You may have this done starting at age 78.  Mammogram. This may be done every 1-2 years. Talk to your health care provider about how often you should have regular mammograms. Talk with your health care provider about your test results, treatment options, and if necessary, the need for more tests. Vaccines  Your health care provider may recommend certain vaccines, such as:  Influenza vaccine. This is recommended every  year.  Tetanus, diphtheria, and acellular pertussis (Tdap, Td) vaccine. You may need a Td booster every 10 years.  Zoster vaccine. You may need this after age 5.  Pneumococcal 13-valent conjugate (PCV13) vaccine.  One dose is recommended after age 79.  Pneumococcal polysaccharide (PPSV23) vaccine. One dose is recommended after age 78. Talk to your health care provider about which screenings and vaccines you need and how often you need them. This information is not intended to replace advice given to you by your health care provider. Make sure you discuss any questions you have with your health care provider. Document Released: 04/13/2015 Document Revised: 12/05/2015 Document Reviewed: 01/16/2015 Elsevier Interactive Patient Education  2017 [place] Prevention in the Home Falls can cause injuries. They can happen to people of all ages. There are many things you can do to make your home safe and to help prevent falls. What can I do on the outside of my home?  Regularly fix the edges of walkways and driveways and fix any cracks.  Remove anything that might make you trip as you walk through a door, such as a raised step or threshold.  Trim any bushes or trees on the path to your home.  Use bright outdoor lighting.  Clear any walking paths of anything that might make someone trip, such as rocks or tools.  Regularly check to see if handrails are loose or broken. Make sure that both sides of any steps have handrails.  Any raised decks and porches should have guardrails on the edges.  Have any leaves, snow, or ice cleared regularly.  Use sand or salt on walking paths during winter.  Clean up any spills in your garage right away. This includes oil or grease spills. What can I do in the bathroom?  Use night lights.  Install grab bars by the toilet and in the tub and shower. Do not use towel bars as grab bars.  Use non-skid mats or decals in the tub or shower.  If you need to sit down in the shower, use a plastic, non-slip stool.  Keep the floor dry. Clean up any water that spills on the floor as soon as it happens.  Remove soap buildup in the tub or shower regularly.  Attach bath  mats securely with double-sided non-slip rug tape.  Do not have throw rugs and other things on the floor that can make you trip. What can I do in the bedroom?  Use night lights.  Make sure that you have a light by your bed that is easy to reach.  Do not use any sheets or blankets that are too big for your bed. They should not hang down onto the floor.  Have a firm chair that has side arms. You can use this for support while you get dressed.  Do not have throw rugs and other things on the floor that can make you trip. What can I do in the kitchen?  Clean up any spills right away.  Avoid walking on wet floors.  Keep items that you use a lot in easy-to-reach places.  If you need to reach something above you, use a strong step stool that has a grab bar.  Keep electrical cords out of the way.  Do not use floor polish or wax that makes floors slippery. If you must use wax, use non-skid floor wax.  Do not have throw rugs and other things on the floor that can make you trip. What can  I do with my stairs?  Do not leave any items on the stairs.  Make sure that there are handrails on both sides of the stairs and use them. Fix handrails that are broken or loose. Make sure that handrails are as long as the stairways.  Check any carpeting to make sure that it is firmly attached to the stairs. Fix any carpet that is loose or worn.  Avoid having throw rugs at the top or bottom of the stairs. If you do have throw rugs, attach them to the floor with carpet tape.  Make sure that you have a light switch at the top of the stairs and the bottom of the stairs. If you do not have them, ask someone to add them for you. What else can I do to help prevent falls?  Wear shoes that:  Do not have high heels.  Have rubber bottoms.  Are comfortable and fit you well.  Are closed at the toe. Do not wear sandals.  If you use a stepladder:  Make sure that it is fully opened. Do not climb a closed  stepladder.  Make sure that both sides of the stepladder are locked into place.  Ask someone to hold it for you, if possible.  Clearly mark and make sure that you can see:  Any grab bars or handrails.  First and last steps.  Where the edge of each step is.  Use tools that help you move around (mobility aids) if they are needed. These include:  Canes.  Walkers.  Scooters.  Crutches.  Turn on the lights when you go into a dark area. Replace any light bulbs as soon as they burn out.  Set up your furniture so you have a clear path. Avoid moving your furniture around.  If any of your floors are uneven, fix them.  If there are any pets around you, be aware of where they are.  Review your medicines with your doctor. Some medicines can make you feel dizzy. This can increase your chance of falling. Ask your doctor what other things that you can do to help prevent falls. This information is not intended to replace advice given to you by your health care provider. Make sure you discuss any questions you have with your health care provider. Document Released: 01/11/2009 Document Revised: 08/23/2015 Document Reviewed: 04/21/2014 Elsevier Interactive Patient Education  2017 [employer].

## 2019-09-06 NOTE — Progress Notes (Signed)
I connected with Ruben Gottron. Dershem today by telephone and verified that I am speaking with the correct person using two identifiers. Location patient: home Location provider: work Persons participating in the virtual visit: Nelline Lio. Pilar Jarvis (son) and Jule Ser. Lowell Guitar, LPN (Nurse Health Advisor)  I discussed the limitations, risks, security and privacy concerns of performing an evaluation and management service by telephone and the availability of in person appointments. I also discussed with the patient that there may be a patient responsible charge related to this service. The patient expressed understanding and verbally consented to this telephonic visit.    Interactive audio and video telecommunications were attempted between this provider and patient, however failed, due to patient having technical difficulties OR patient did not have access to video capability.  We continued and completed visit with audio only.  Some vital signs may be absent or patient reported.   Time Spent with patient on telephone encounter: 30 minutes  Subjective:   Brittney Gates is a 86 y.o. female who presents for Medicare Annual (Subsequent) preventive examination.  Review of Systems:  No ROS. Medicare Wellness Visit. Cardiac Risk Factors include: advanced age (>46men, >25 women)     Objective:     Vitals: There were no vitals taken for this visit.  There is no height or weight on file to calculate BMI.  Advanced Directives 09/06/2019 07/12/2016 05/29/2016 04/28/2016 01/21/2012 01/21/2012  Does Patient Have a Medical Advance Directive? Yes No Yes No Patient does not have advance directive;Patient would like information Patient does not have advance directive;Patient would like information  Type of Advance Directive - - [place];Living will - - -  Does patient want to make changes to medical advance directive? No - Patient declined - No - Patient declined - - -  Copy of Trail in Chart? - - No - copy requested - - -  Would patient like information on creating a medical advance directive? - No - Patient declined - No - Patient declined - -    Tobacco Social History   Tobacco Use  Smoking Status Never Smoker  Smokeless Tobacco Never Used     Counseling given: Not Answered   Clinical Intake:  Pre-visit preparation completed: Yes  Pain : 0-10 Pain Score: 10-Worst pain ever Pain Type: Chronic pain Pain Location: Back Pain Orientation: Lower Pain Radiating Towards: left lower extremity Pain Descriptors / Indicators: Constant, Discomfort, Burning Pain Onset: More than a month ago Pain Frequency: Constant Pain Relieving Factors: Hydrocodone-Acetaminophen Effect of Pain on Daily Activities: Yes  Pain Relieving Factors: Hydrocodone-Acetaminophen  Nutritional Risks: None Diabetes: No  How often do you need to have someone help you when you read instructions, pamphlets, or other written materials from your doctor or pharmacy?: 1 - Never What is the last grade level you completed in school?: High School Graduate  Interpreter Needed?: No  Information entered by :: Shenika N. Lowell Guitar, LPN  Past Medical History:  Diagnosis Date  . Acute pancreatitis   . ANEMIA   . ANXIETY   . Aortic stenosis    Echo 06/2019: EF 55-60, no RWMA, mild LVH, normal RV SF, moderate LAE, mild MR, moderate aortic stenosis (mean 20.3 mmHg), mild dilation of aortic root (40 mm)  . Arthritis   . Chronic anemia   . Diverticulosis   . Gastritis   . Hemorrhoids   . Hiatal hernia   . HYPERLIPIDEMIA   . HYPERTENSION   .  HYPOTHYROIDISM   . LOW BACK PAIN, CHRONIC   . PAD (peripheral artery disease) ([place])    Past Surgical History:  Procedure Laterality Date  . ANGIOPLASTY  1995  . APPENDECTOMY    . BACK SURGERY     x's 2  . BREAST SURGERY     Benign  . FEMUR IM NAIL  01/21/2012   Procedure: INTRAMEDULLARY (IM) NAIL FEMORAL;  Surgeon: Newt Minion, MD;   Location: [facility];  Service: Orthopedics;  Laterality: Left;  Intertrochanteric Nail Left Hip  . HEMORRHOID SURGERY    . IR GENERIC HISTORICAL  05/29/2016   IR SACROPLASTY BILATERAL 05/29/2016 [name], MD [facility]  . IR GENERIC HISTORICAL  06/19/2016   IR RADIOLOGIST EVAL & MGMT 06/19/2016 [facility]  . TUBAL LIGATION     Family History  Problem Relation Age of Onset  . Lung cancer Father   . Gallbladder disease Son   . Gallbladder disease Daughter   . Colon cancer Neg Hx    Social History   Socioeconomic History  . Marital status: Widowed    Spouse name: Not on file  . Number of children: 4  . Years of education: Not on file  . Highest education level: Not on file  Occupational History  . Occupation: retired  Tobacco Use  . Smoking status: Never Smoker  . Smokeless tobacco: Never Used  Substance and Sexual Activity  . Alcohol use: No    Alcohol/week: 0.0 standard drinks  . Drug use: No  . Sexual activity: Not on file  Other Topics Concern  . 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 Difficulty of Paying Living Expenses:   Food Insecurity:   . Worried About Charity fundraiser in the Last Year:   . Arboriculturist in the Last Year:   Transportation Needs:   . Film/video editor (Medical):   Marland Kitchen Lack of Transportation (Non-Medical):   Physical Activity:   . Days of Exercise per Week:   . Minutes of Exercise per Session:   Stress:   . Feeling of Stress :   Social Connections:   . Frequency of Communication with Friends and Family:   . Frequency of Social Gatherings with Friends and Family:   . Attends Religious Services:   . Active Member of Clubs or Organizations:   . Attends Archivist  Meetings:   Marland Kitchen Marital Status:     Outpatient Encounter Medications as of 09/06/2019  Medication Sig  . amLODipine-benazepril (LOTREL) 5-20 MG capsule TAKE ONE CAPSULE BY MOUTH DAILY  . aspirin EC 81 MG tablet Take 81 mg by mouth daily.  . Cholecalciferol 2000 UNITS TABS Take 2,000 Units by mouth daily.  Marland Kitchen HYDROcodone-acetaminophen (NORCO/VICODIN) 5-325 MG tablet Take 1 tablet by mouth every 4 (four) hours as needed for moderate pain.  Marland Kitchen levothyroxine (SYNTHROID) 100 MCG tablet TAKE ONE TABLET BY MOUTH EVERY MORNING BEFORE BREAKFAST  . Multiple Vitamin (MULTIVITAMIN WITH MINERALS) TABS tablet Take 1 tablet by mouth daily.  . pregabalin (LYRICA) 50 MG capsule TAKE ONE CAPSULE BY MOUTH TWICE A DAY  . sennosides-docusate sodium (SENOKOT-S) 8.6-50 MG tablet Take 2 tablets by mouth 2 (two) times  daily as needed for constipation.   Marland Kitchen zolpidem (AMBIEN) 10 MG tablet TAKE ONE TABLET BY MOUTH EVERY NIGHT AT BEDTIME AS NEEDED FOR SLEEP   No facility-administered encounter medications on file as of 09/06/2019.    Activities of Daily Living In your present state of health, do you have any difficulty performing the following activities: 09/06/2019  Hearing? N  Vision? N  Difficulty concentrating or making decisions? N  Walking or climbing stairs? Y  Dressing or bathing? N  Doing errands, shopping? Y  Preparing Food and eating ? Y  Using the Toilet? N  In the past six months, have you accidently leaked urine? Y  Do you have problems with loss of bowel control? N  Managing your Medications? N  Managing your Finances? Y  Comment son handles finances and housekeeping  Housekeeping or managing your Housekeeping? Y  Some recent data might be hidden    Patient Care Team: [name], MD as PCP - General (Internal Medicine) Sherren Mocha, MD as PCP - Cardiology (Cardiology) Sherren Mocha, MD as Consulting Physician (Cardiology) Newt Minion, MD as Consulting Physician (Orthopedic  Surgery) Jenne Campus, MD as Consulting Physician (Neurosurgery) Calvert Cantor, MD as Consulting Physician (Ophthalmology) Carolan Clines, MD (Inactive) as Consulting Physician (Urology) Bo Merino, MD (Rheumatology) Rozetta Nunnery, MD (Otolaryngology)    Assessment:   This is a routine wellness examination for [place].  Exercise Activities and Dietary recommendations Current Exercise Habits: The patient does not participate in regular exercise at present(uses a floor mopdel exercise cycle every other day)  Goals    . Patient Stated (pt-stated)     To increase my mobility, stay alert by reading books and playing brain games and having fun with my family.       Fall Risk Fall Risk  09/06/2019 09/06/2018 03/05/2017 12/24/2016 03/07/2016  Falls in the past year? 0 1 No No No  Comment - - Emmi Telephone Survey: data to providers prior to load - Emmi Telephone Survey: data to providers prior to load  Number falls in past yr: 0 0 - - -  Injury with Fall? 0 1 - - -  Risk for fall due to : Impaired balance/gait;Impaired mobility;Orthopedic patient - - - -  Follow up Falls evaluation completed;Falls prevention discussed - - - -   Is the patient's home free of loose throw rugs in walkways, pet beds, electrical cords, etc?   yes      Grab bars in the bathroom? yes      Handrails on the stairs?   yes      Adequate lighting?   yes  Timed Get Up and Go performed: not indicated  Depression Screen PHQ 2/9 Scores 09/06/2019 09/06/2018 12/24/2016 10/30/2014  PHQ - 2 Score 0 0 0 0     Cognitive Function: not indicated        Immunization History  Administered Date(s) Administered  . Influenza Split 12/04/2011, 12/13/2012, 12/12/2013  . Influenza, High Dose Seasonal PF 12/03/2015, 11/14/2018  . Influenza-Unspecified 11/29/2013, 12/09/2014, 11/29/2017  . Pneumococcal Conjugate-13 12/30/2013  . Pneumococcal Polysaccharide-23 03/31/2009  . Td 03/31/2001, 12/30/2013    Qualifies  for Shingles Vaccine? Yes, will check with local pharmacy or talk with provider at next visit.  Screening Tests Health Maintenance  Topic Date Due  . COVID-19 Vaccine (1) Never done  . INFLUENZA VACCINE  10/30/2019  . TETANUS/TDAP  12/31/2023  . DEXA SCAN  Completed  . PNA vac Low Risk Adult  Completed  Cancer Screenings: Lung: Low Dose CT Chest recommended if Age 64-80 years, 30 pack-year currently smoking OR have quit w/in 15years. Patient does not qualify. Breast:  Up to date on Mammogram? No, not recommended due to age Up to date of Bone Density/Dexa? No,  not recommended due to age Colorectal: not recommended due to age  Additional Screenings: Hepatitis C Screening: never done     Plan:    Reviewed health maintenance screenings with patient today and relevant education, vaccines, and/or referrals were provided.    Continue doing brain stimulating activities (puzzles, reading, adult coloring books, staying active) to keep memory sharp.    Continue to eat heart healthy diet (full of fruits, vegetables, whole grains, lean protein, water--limit salt, fat, and sugar intake) and increase physical activity as tolerated.   I have personally reviewed and noted the following in the patient's chart:   . Medical and social history . Use of alcohol, tobacco or illicit drugs  . Current medications and supplements . Functional ability and status . Nutritional status . Physical activity . Advanced directives . List of other physicians . Hospitalizations, surgeries, and ER visits in previous 12 months . Vitals . Screenings to include cognitive, depression, and falls . Referrals and appointments  In addition, I have reviewed and discussed with patient certain preventive protocols, quality metrics, and best practice recommendations. A written personalized care plan for preventive services as well as general preventive health recommendations were provided to patient.     Sheral Flow, LPN  08/07/4583  Nurse Health Advisor  Nurse Notes: There were no vitals filed for this visit. There is no height or weight on file to calculate BMI.

## 2019-09-09 ENCOUNTER — Ambulatory Visit (INDEPENDENT_AMBULATORY_CARE_PROVIDER_SITE_OTHER): Payer: Medicare Other | Admitting: Internal Medicine

## 2019-09-09 ENCOUNTER — Encounter: Payer: Self-pay | Admitting: Internal Medicine

## 2019-09-09 ENCOUNTER — Other Ambulatory Visit: Payer: Self-pay

## 2019-09-09 VITALS — BP 142/70 | HR 93 | Temp 97.9°F | Ht 66.0 in | Wt 174.0 lb

## 2019-09-09 DIAGNOSIS — E785 Hyperlipidemia, unspecified: Secondary | ICD-10-CM

## 2019-09-09 DIAGNOSIS — E039 Hypothyroidism, unspecified: Secondary | ICD-10-CM | POA: Diagnosis not present

## 2019-09-09 DIAGNOSIS — M79641 Pain in right hand: Secondary | ICD-10-CM | POA: Diagnosis not present

## 2019-09-09 DIAGNOSIS — M79642 Pain in left hand: Secondary | ICD-10-CM

## 2019-09-09 DIAGNOSIS — R200 Anesthesia of skin: Secondary | ICD-10-CM

## 2019-09-09 LAB — LIPID PANEL
Cholesterol: 214 mg/dL — ABNORMAL HIGH (ref 0–200)
HDL: 51 mg/dL (ref >39.00)
LDL Cholesterol: 135 mg/dL — ABNORMAL HIGH (ref 0–99)
NonHDL: 163.23
Total CHOL/HDL Ratio: 4
Triglycerides: 141 mg/dL (ref 0.0–149.0)
VLDL: 28.2 mg/dL (ref 0.0–40.0)

## 2019-09-09 LAB — CBC
HCT: 33.5 % — ABNORMAL LOW (ref 36.0–46.0)
Hemoglobin: 11.1 g/dL — ABNORMAL LOW (ref 12.0–15.0)
MCHC: 33.2 g/dL (ref 30.0–36.0)
MCV: 106.2 fl — ABNORMAL HIGH (ref 78.0–100.0)
Platelets: 209 10*3/uL (ref 150.0–400.0)
RBC: 3.15 Mil/uL — ABNORMAL LOW (ref 3.87–5.11)
RDW: 22.7 % — ABNORMAL HIGH (ref 11.5–15.5)
WBC: 7.8 10*3/uL (ref 4.0–10.5)

## 2019-09-09 LAB — COMPREHENSIVE METABOLIC PANEL
ALT: 12 U/L (ref 0–35)
AST: 17 U/L (ref 0–37)
Albumin: 4.4 g/dL (ref 3.5–5.2)
Alkaline Phosphatase: 60 U/L (ref 39–117)
BUN: 26 mg/dL — ABNORMAL HIGH (ref 6–23)
CO2: 28 mEq/L (ref 19–32)
Calcium: 9.9 mg/dL (ref 8.4–10.5)
Chloride: 103 mEq/L (ref 96–112)
Creatinine, Ser: 1.11 mg/dL (ref 0.40–1.20)
GFR: 45.88 mL/min — ABNORMAL LOW (ref >60.00)
Glucose, Bld: 104 mg/dL — ABNORMAL HIGH (ref 70–99)
Potassium: 4 mEq/L (ref 3.5–5.1)
Sodium: 138 mEq/L (ref 135–145)
Total Bilirubin: 0.6 mg/dL (ref 0.2–1.2)
Total Protein: 7.3 g/dL (ref 6.0–8.3)

## 2019-09-09 LAB — TSH: TSH: 2.97 u[IU]/mL (ref 0.35–4.50)

## 2019-09-09 MED ORDER — HYDROCODONE-ACETAMINOPHEN 5-325 MG PO TABS: 1.0000 | ORAL_TABLET | ORAL | 0 refills | Status: DC | PRN

## 2019-09-09 NOTE — Progress Notes (Signed)
   Subjective:   Patient ID: Brittney Gates, female    DOB: 11/20/1924, 86 y.o.   MRN: [id number]  HPI The patient is a 86 YO female coming in for worsening pain and numbness in her hands. Prior carpal tunnel years ago. She is having more problems with holding items and feeding self. She is able to hold larger objects. Has feeling in hands but they feel very slippery on things like marble. Using lyrica which does help some. Rare hydrocodone for pain also. Taking tylenol some also.   Review of Systems  Constitutional: Negative.   HENT: Negative.   Eyes: Negative.   Respiratory: Negative for cough, chest tightness and shortness of breath.   Cardiovascular: Negative for chest pain, palpitations and leg swelling.  Gastrointestinal: Negative for abdominal distention, abdominal pain, constipation, diarrhea, nausea and vomiting.  Musculoskeletal: Positive for arthralgias and myalgias.  Skin: Negative.   Neurological: Positive for numbness.  Psychiatric/Behavioral: Negative.     Objective:  Physical Exam Constitutional:      Appearance: She is well-developed.  HENT:     Head: Normocephalic and atraumatic.  Cardiovascular:     Rate and Rhythm: Normal rate and regular rhythm.  Pulmonary:     Effort: Pulmonary effort is normal. No respiratory distress.     Breath sounds: Normal breath sounds. No wheezing or rales.  Abdominal:     General: Bowel sounds are normal. There is no distension.     Palpations: Abdomen is soft.     Tenderness: There is no abdominal tenderness. There is no rebound.  Musculoskeletal:        General: Tenderness present.     Cervical back: Normal range of motion.  Skin:    General: Skin is warm and dry.  Neurological:     Mental Status: She is alert and oriented to person, place, and time.     Coordination: Coordination abnormal.     Comments: Walker for ambulation     Vitals:   09/09/19 1406  BP: (!) 142/70  Pulse: 93  Temp: 97.9 F (36.6 C)  TempSrc: Oral   SpO2: 95%  Weight: 174 lb (78.9 kg)  Height: 5\' 6"  (1.676 m)    This visit occurred during the SARS-CoV-2 public health emergency.  Safety protocols were in place, including screening questions prior to the visit, additional usage of staff PPE, and extensive cleaning of exam room while observing appropriate contact time as indicated for disinfecting solutions.   Assessment & Plan:

## 2019-09-09 NOTE — Assessment & Plan Note (Signed)
Refer to hand surgery for evaluation and treatment.

## 2019-09-09 NOTE — Patient Instructions (Signed)
We will get you in with Dr. Amedeo Plenty for the hands.

## 2019-09-27 ENCOUNTER — Telehealth: Payer: Self-pay | Admitting: Cardiovascular Disease

## 2019-09-27 NOTE — Telephone Encounter (Signed)
Hard to know without in-person evaluation. Recent echo wouldn't suggest high-risk for CHF as LV function is normal and there is no severe valvular disease. Could arrange next available with me or an APP in the office. thanks

## 2019-09-27 NOTE — Telephone Encounter (Signed)
Spoke with Brittney Gates - pt's son (DPR on file) who reports pt has been c/o "sucking air" for the past 1& 1/2 to 2 weeks.  She is a lot more SOB and it is much worse when laying down.  Son reports this is something new for her.  She reports some edema in her feet bilaterally but not her legs, denies any increase in wt, no cough and no abdominal distension.  She does c/o of some pain/tenderness to the touch behind her left knee.  NO increased warmth or discoloration in that area.  Reports she was told years ago she had a blood clot there though I has not been able to confirm that.  Pt was recently seen by Dr Sharlet Salina 6/11 for worsening pain and numbness in her hands.  BP 142/70  HR 93,  O2 sat 95%.  Son reports pt's SOB was just starting then and they did not discuss it with her at this time.  Lab that day demonstrated BUN 26, Crea 1.11, K+ 4.0, H/H 11.1 & 33.5.  Son is concerned pt may have CHF. Last echo 07/21/2019 Stable findings of normal LV function, moderate aortic stenosis, mild mitral regurgitation. Not on any diuretics.  Advised son I will send this information to Dr Burt Knack and his nurse for review.  He is aware he will be called back with any further instructions and appt if necessary.  They would prefer being seen only by Dr Burt Knack if possible.

## 2019-09-27 NOTE — Telephone Encounter (Signed)
New message  Pt c/o Shortness Of Breath: STAT if SOB developed within the last 24 hours or pt is noticeably SOB on the phone  1. Are you currently SOB (can you hear that pt is SOB on the phone)? No, son called in for patient   2. How long have you been experiencing SOB? 2 weeks  3. Are you SOB when sitting or when up moving around? Both  4. Are you currently experiencing any other symptoms? Just SOB when moving around, laying down, and sitting. Wants to see Dr. Burt Knack personally.

## 2019-09-28 NOTE — Telephone Encounter (Signed)
Follow up  Pt's son returning call

## 2019-09-28 NOTE — Telephone Encounter (Signed)
Scheduled patient with Dr. Burt Knack 7/21. Mr. Tarkowski (son) was grateful for assistance. He will call if symptoms worsen prior to that time.

## 2019-09-28 NOTE — Telephone Encounter (Signed)
Left message to call back  

## 2019-09-30 ENCOUNTER — Other Ambulatory Visit: Payer: Self-pay | Admitting: Internal Medicine

## 2019-09-30 DIAGNOSIS — E039 Hypothyroidism, unspecified: Secondary | ICD-10-CM

## 2019-10-01 ENCOUNTER — Other Ambulatory Visit: Payer: Self-pay | Admitting: Internal Medicine

## 2019-10-05 ENCOUNTER — Emergency Department (HOSPITAL_COMMUNITY): Payer: Medicare Other

## 2019-10-05 ENCOUNTER — Other Ambulatory Visit: Payer: Self-pay

## 2019-10-05 ENCOUNTER — Encounter (HOSPITAL_COMMUNITY): Payer: Self-pay

## 2019-10-05 ENCOUNTER — Inpatient Hospital Stay (HOSPITAL_COMMUNITY)
Admission: EM | Admit: 2019-10-05 | Discharge: 2019-10-11 | DRG: 286 | Disposition: A | Discharge status: Home Health | Payer: Medicare Other | Attending: Internal Medicine | Admitting: Internal Medicine

## 2019-10-05 DIAGNOSIS — N179 Acute kidney failure, unspecified: Secondary | ICD-10-CM | POA: Diagnosis not present

## 2019-10-05 DIAGNOSIS — I2510 Atherosclerotic heart disease of native coronary artery without angina pectoris: Secondary | ICD-10-CM

## 2019-10-05 DIAGNOSIS — Z79890 Hormone replacement therapy (postmenopausal): Secondary | ICD-10-CM

## 2019-10-05 DIAGNOSIS — J80 Acute respiratory distress syndrome: Secondary | ICD-10-CM | POA: Diagnosis not present

## 2019-10-05 DIAGNOSIS — Z20822 Contact with and (suspected) exposure to covid-19: Secondary | ICD-10-CM | POA: Diagnosis not present

## 2019-10-05 DIAGNOSIS — D649 Anemia, unspecified: Secondary | ICD-10-CM | POA: Diagnosis present

## 2019-10-05 DIAGNOSIS — N183 Chronic kidney disease, stage 3 unspecified: Secondary | ICD-10-CM

## 2019-10-05 DIAGNOSIS — R0603 Acute respiratory distress: Secondary | ICD-10-CM | POA: Diagnosis present

## 2019-10-05 DIAGNOSIS — I447 Left bundle-branch block, unspecified: Secondary | ICD-10-CM | POA: Diagnosis not present

## 2019-10-05 DIAGNOSIS — Z801 Family history of malignant neoplasm of trachea, bronchus and lung: Secondary | ICD-10-CM

## 2019-10-05 DIAGNOSIS — R0689 Other abnormalities of breathing: Secondary | ICD-10-CM | POA: Diagnosis not present

## 2019-10-05 DIAGNOSIS — E785 Hyperlipidemia, unspecified: Secondary | ICD-10-CM | POA: Diagnosis present

## 2019-10-05 DIAGNOSIS — R52 Pain, unspecified: Secondary | ICD-10-CM | POA: Diagnosis not present

## 2019-10-05 DIAGNOSIS — I5021 Acute systolic (congestive) heart failure: Secondary | ICD-10-CM

## 2019-10-05 DIAGNOSIS — R0789 Other chest pain: Secondary | ICD-10-CM | POA: Diagnosis present

## 2019-10-05 DIAGNOSIS — I200 Unstable angina: Secondary | ICD-10-CM

## 2019-10-05 DIAGNOSIS — R0602 Shortness of breath: Secondary | ICD-10-CM | POA: Diagnosis not present

## 2019-10-05 DIAGNOSIS — M545 Low back pain: Secondary | ICD-10-CM | POA: Diagnosis present

## 2019-10-05 DIAGNOSIS — Z79899 Other long term (current) drug therapy: Secondary | ICD-10-CM

## 2019-10-05 DIAGNOSIS — Z66 Do not resuscitate: Secondary | ICD-10-CM | POA: Diagnosis present

## 2019-10-05 DIAGNOSIS — I25110 Atherosclerotic heart disease of native coronary artery with unstable angina pectoris: Secondary | ICD-10-CM | POA: Diagnosis not present

## 2019-10-05 DIAGNOSIS — E872 Acidosis: Secondary | ICD-10-CM | POA: Diagnosis present

## 2019-10-05 DIAGNOSIS — I2584 Coronary atherosclerosis due to calcified coronary lesion: Secondary | ICD-10-CM | POA: Diagnosis present

## 2019-10-05 DIAGNOSIS — I10 Essential (primary) hypertension: Secondary | ICD-10-CM | POA: Diagnosis present

## 2019-10-05 DIAGNOSIS — G629 Polyneuropathy, unspecified: Secondary | ICD-10-CM | POA: Diagnosis present

## 2019-10-05 DIAGNOSIS — D631 Anemia in chronic kidney disease: Secondary | ICD-10-CM | POA: Diagnosis present

## 2019-10-05 DIAGNOSIS — R519 Headache, unspecified: Secondary | ICD-10-CM | POA: Diagnosis not present

## 2019-10-05 DIAGNOSIS — N1831 Chronic kidney disease, stage 3a: Secondary | ICD-10-CM | POA: Diagnosis present

## 2019-10-05 DIAGNOSIS — I130 Hypertensive heart and chronic kidney disease with heart failure and stage 1 through stage 4 chronic kidney disease, or unspecified chronic kidney disease: Secondary | ICD-10-CM | POA: Diagnosis not present

## 2019-10-05 DIAGNOSIS — Z7982 Long term (current) use of aspirin: Secondary | ICD-10-CM

## 2019-10-05 DIAGNOSIS — G8929 Other chronic pain: Secondary | ICD-10-CM | POA: Diagnosis present

## 2019-10-05 DIAGNOSIS — E039 Hypothyroidism, unspecified: Secondary | ICD-10-CM | POA: Diagnosis present

## 2019-10-05 DIAGNOSIS — D539 Nutritional anemia, unspecified: Secondary | ICD-10-CM | POA: Diagnosis present

## 2019-10-05 DIAGNOSIS — I350 Nonrheumatic aortic (valve) stenosis: Secondary | ICD-10-CM

## 2019-10-05 DIAGNOSIS — I739 Peripheral vascular disease, unspecified: Secondary | ICD-10-CM | POA: Diagnosis present

## 2019-10-05 DIAGNOSIS — M810 Age-related osteoporosis without current pathological fracture: Secondary | ICD-10-CM | POA: Diagnosis present

## 2019-10-05 LAB — CBC
HCT: 32.4 % — ABNORMAL LOW (ref 36.0–46.0)
Hemoglobin: 10.7 g/dL — ABNORMAL LOW (ref 12.0–15.0)
MCH: 35.1 pg — ABNORMAL HIGH (ref 26.0–34.0)
MCHC: 33 g/dL (ref 30.0–36.0)
MCV: 106.2 fL — ABNORMAL HIGH (ref 80.0–100.0)
Platelets: 162 10*3/uL (ref 150–400)
RBC: 3.05 MIL/uL — ABNORMAL LOW (ref 3.87–5.11)
RDW: 20.4 % — ABNORMAL HIGH (ref 11.5–15.5)
WBC: 11.8 10*3/uL — ABNORMAL HIGH (ref 4.0–10.5)
nRBC: 0.2 % (ref 0.0–0.2)

## 2019-10-05 LAB — COMPREHENSIVE METABOLIC PANEL
ALT: 15 U/L (ref 0–44)
AST: 19 U/L (ref 15–41)
Albumin: 3.7 g/dL (ref 3.5–5.0)
Alkaline Phosphatase: 61 U/L (ref 38–126)
Anion gap: 11 (ref 5–15)
BUN: 29 mg/dL — ABNORMAL HIGH (ref 8–23)
CO2: 22 mmol/L (ref 22–32)
Calcium: 8.8 mg/dL — ABNORMAL LOW (ref 8.9–10.3)
Chloride: 109 mmol/L (ref 98–111)
Creatinine, Ser: 1.52 mg/dL — ABNORMAL HIGH (ref 0.44–1.00)
GFR calc Af Amer: 34 mL/min — ABNORMAL LOW (ref >60)
GFR calc non Af Amer: 29 mL/min — ABNORMAL LOW (ref >60)
Glucose, Bld: 134 mg/dL — ABNORMAL HIGH (ref 70–99)
Potassium: 4.1 mmol/L (ref 3.5–5.1)
Sodium: 142 mmol/L (ref 135–145)
Total Bilirubin: 0.5 mg/dL (ref 0.3–1.2)
Total Protein: 6.8 g/dL (ref 6.5–8.1)

## 2019-10-05 LAB — TROPONIN I (HIGH SENSITIVITY): Troponin I (High Sensitivity): 70 ng/L — ABNORMAL HIGH (ref <18)

## 2019-10-05 LAB — BRAIN NATRIURETIC PEPTIDE: B Natriuretic Peptide: 214.9 pg/mL — ABNORMAL HIGH (ref 0.0–100.0)

## 2019-10-05 LAB — LIPASE, BLOOD: Lipase: 23 U/L (ref 11–51)

## 2019-10-05 MED ORDER — ASPIRIN 81 MG PO CHEW
324.0000 mg | CHEWABLE_TABLET | Freq: Once | ORAL | Status: AC
  Administered 2019-10-06: 324 mg via ORAL
  Filled 2019-10-05 (×2): qty 4

## 2019-10-05 NOTE — ED Triage Notes (Signed)
Pt states that she's been feeling bad and slightly short of breath for two days, tonight she states it was worse especially on exertion

## 2019-10-05 NOTE — ED Provider Notes (Signed)
[facility] DEPT Provider Note   CSN: [id number] Arrival date & time: 10/05/19  2147     History Chief Complaint  Patient presents with  . Shortness of Breath    Brittney Gates is a 86 y.o. female with a history of pancreatitis, HLD, HTN, hypothyroidism, chronic anemia, aortic stenosis, chronic low back pain, and PAD who presents the emergency department by EMS from home with a chief complaint of shortness of breath.  She reports progressively worsening shortness of breath both at rest and with exertion accompanied by generalized weakness, and fatigue for the last week.  She does note that she was having some mild shortness of breath approximately 2 weeks ago that seem to come and go, but her symptoms for the last week have been constant since onset.  Tonight, she became acutely much more short of breath while sitting down.  Her son notes that the patient was very unwell appearing prior to arrival -- clammy with intermittent pallor and flushing prior to firefighters arriving and starting her on CPAP until EMS arrived and transitioned her to a nonrebreather.  She was given 8 baby aspirin by her son at home.  EMS reports that her heart rate was in the 130s, initial BP was 170/100, and she was satting at 92% with tachypnea on room air.  She was given 0.4 of sublingual nitroglycerin, and the patient reports that she had marked improvement in her breathing following NTG.  The patient adamantly denies chest pain or tightness.  She has had a nonproductive cough that is worse at night for the last week with PND.  No orthopnea, leg swelling, palpitations, nausea, vomiting, diarrhea, rash, URI symptoms, fever, or chills.  She reports that she feels as if her abdomen has been more swollen over the last week, but denies abdominal pain.  Family also reports that she began having an intermittent sharp, right-sided headache for the last week.  Her son has been giving her aspirin at  home for her headache with intermittent improvement.  No numbness, weakness, slurred speech, dizziness, lightheadedness, facial droop, seizure-like activity, or syncope.  She is followed by Dr. Burt Knack with cardiology.  She had a normal cardiac catheterization in 1999.  She had a cardiac stress test and June 2019 without ischemia.  She had an echo in April 2021 with normal LV function, moderate aortic stenosis, and mild mitral regurgitation.  There was also mild dilatation of the aortic root measuring 40 mm.  The history is provided by the patient. No language interpreter was used.       Past Medical History:  Diagnosis Date  . Acute pancreatitis   . ANEMIA   . ANXIETY   . Aortic stenosis    Echo 06/2019: EF 55-60, no RWMA, mild LVH, normal RV SF, moderate LAE, mild MR, moderate aortic stenosis (mean 20.3 mmHg), mild dilation of aortic root (40 mm)  . Arthritis   . Chronic anemia   . Diverticulosis   . Gastritis   . Hemorrhoids   . Hiatal hernia   . HYPERLIPIDEMIA   . HYPERTENSION   . HYPOTHYROIDISM   . LOW BACK PAIN, CHRONIC   . PAD (peripheral artery disease) [facility])     Patient Active Problem List   Diagnosis Date Noted  . Left chest pressure 10/06/2019  . Aortic stenosis 10/06/2019  . Fever 11/25/2018  . Hand numbness 09/07/2018  . Weight gain 11/20/2017  . Ankle swelling 08/08/2016  . Spondylosis of lumbar spine 05/05/2016  .  Palliative care by specialist 04/30/2016  . Lumbosacral radiculopathy at L5   . Sacral insufficiency fracture with delayed healing   . Lumbar radicular pain 04/28/2016  . Radicular low back pain 04/28/2016  . Routine general medical examination at a health care facility 11/01/2015  . Abnormal CT scan, chest 05/06/2012  . Osteoporosis 04/30/2012  . Hip fracture left 01/20/2012  . PVD (peripheral vascular disease) ([place])   . ANEMIA 04/24/2010  . Hypothyroidism 08/14/2007  . Hyperlipidemia 08/14/2007  . ANXIETY 08/14/2007  . Essential  hypertension 08/14/2007  . Osteoarthritis 08/14/2007  . Lumbar back pain with radiculopathy affecting right lower extremity 08/14/2007    Past Surgical History:  Procedure Laterality Date  . ANGIOPLASTY  1995  . APPENDECTOMY    . BACK SURGERY     x's 2  . BREAST SURGERY     Benign  . FEMUR IM NAIL  01/21/2012   Procedure: INTRAMEDULLARY (IM) NAIL FEMORAL;  Surgeon: Newt Minion, MD;  Location: [facility];  Service: Orthopedics;  Laterality: Left;  Intertrochanteric Nail Left Hip  . HEMORRHOID SURGERY    . IR GENERIC HISTORICAL  05/29/2016   IR SACROPLASTY BILATERAL 05/29/2016 [name], MD [facility]  . IR GENERIC HISTORICAL  06/19/2016   IR RADIOLOGIST EVAL & MGMT 06/19/2016 [facility]  . TUBAL LIGATION       OB History   No obstetric history on file.     Family History  Problem Relation Age of Onset  . Lung cancer Father   . Gallbladder disease Son   . Gallbladder disease Daughter   . Colon cancer Neg Hx     Social History   Tobacco Use  . Smoking status: Never Smoker  . Smokeless tobacco: Never Used  Vaping Use  . Vaping Use: Never used  Substance Use Topics  . Alcohol use: No    Alcohol/week: 0.0 standard drinks  . Drug use: No    Home Medications Prior to Admission medications   Medication Sig Start Date End Date Taking? Authorizing Provider  acetaminophen (TYLENOL) 325 MG tablet Take 325 mg by mouth every 6 (six) hours as needed for mild pain, moderate pain or headache.   Yes [provider]  amLODipine-benazepril (LOTREL) 5-20 MG capsule TAKE ONE CAPSULE BY MOUTH DAILY Patient taking differently: Take 1 capsule by mouth daily.  09/30/19  Yes [name], MD  aspirin EC 81 MG tablet Take 81 mg by mouth daily.   Yes [provider]  Cholecalciferol 2000 UNITS TABS Take 2,000 Units by mouth daily.   Yes [provider]  HYDROcodone-acetaminophen (NORCO/VICODIN) 5-325 MG tablet Take 1 tablet by mouth every 4 (four)  hours as needed for moderate pain. Patient taking differently: Take 0.5-1 tablets by mouth every 4 (four) hours as needed for moderate pain.  09/09/19  Yes [name], MD  levothyroxine (SYNTHROID) 100 MCG tablet TAKE ONE TABLET BY MOUTH EVERY MORNING BEFORE BREAKFAST Patient taking differently: Take 100 mcg by mouth daily before breakfast.  09/30/19  Yes [name], MD  Multiple Vitamin (MULTIVITAMIN WITH MINERALS) TABS tablet Take 1 tablet by mouth daily.   Yes [provider]  Omega 3-6-9 Fatty Acids (TRIPLE OMEGA-3-6-9 PO) Take 1 capsule by mouth daily.   Yes [provider]  pregabalin (LYRICA) 50 MG capsule TAKE ONE CAPSULE BY MOUTH TWICE A DAY Patient taking differently: Take 50 mg by mouth daily.  10/04/19  Yes [name], MD  sennosides-docusate sodium (SENOKOT-S)  8.6-50 MG tablet Take 2 tablets by mouth daily as needed for constipation.    Yes [provider]  zolpidem (AMBIEN) 10 MG tablet TAKE ONE TABLET BY MOUTH EVERY NIGHT AT BEDTIME AS NEEDED FOR SLEEP Patient taking differently: Take 10 mg by mouth at bedtime.  07/29/19  Yes [name], MD    Allergies    Statins, Lidocaine, and Metamucil [psyllium]  Review of Systems   Review of Systems  Constitutional: Positive for appetite change, diaphoresis and fatigue. Negative for activity change, chills, fever and unexpected weight change.  HENT: Negative for congestion, postnasal drip, rhinorrhea and sore throat.   Eyes: Negative for visual disturbance.  Respiratory: Positive for cough and shortness of breath. Negative for choking, chest tightness and stridor.   Cardiovascular: Negative for chest pain.       + PND  Gastrointestinal: Negative for abdominal pain, blood in stool, constipation, diarrhea, nausea and vomiting.  Genitourinary: Negative for dysuria and hematuria.  Musculoskeletal: Negative for back pain, joint swelling, myalgias, neck pain and neck stiffness.    Skin: Negative for rash.  Allergic/Immunologic: Negative for immunocompromised state.  Neurological: Positive for weakness (generalized weakness) and headaches. Negative for dizziness, seizures, syncope, light-headedness and numbness.  Psychiatric/Behavioral: Negative for confusion.    Physical Exam Updated Vital Signs BP 105/75 (BP Location: Right Arm)   Pulse 77   Temp 97.8 F (36.6 C) (Oral)   Resp (!) 23   Ht 5\' 6"  (1.676 m)   Wt 74.8 kg   SpO2 99%   BMI 26.63 kg/m   Physical Exam Vitals and nursing note reviewed.  Constitutional:      General: She is not in acute distress.    Appearance: She is not ill-appearing, toxic-appearing or diaphoretic.     Comments: Appears much, much younger than stated age.  HENT:     Head: Normocephalic.     Ears:     Comments: Right-sided cerumen impaction.  Eyes:     Extraocular Movements: Extraocular movements intact.     Conjunctiva/sclera: Conjunctivae normal.     Pupils: Pupils are equal, round, and reactive to light.  Neck:     Comments: No JVD Cardiovascular:     Rate and Rhythm: Normal rate and regular rhythm.     Pulses: Normal pulses.     Heart sounds: Normal heart sounds. No murmur heard.  No friction rub. No gallop.      Comments: 2+ radial and DP and PT pulses. Pulmonary:     Effort: Pulmonary effort is normal. No respiratory distress.     Breath sounds: No stridor.     Comments: Scattered wheezes and crackles in the bilateral bases. Chest:     Chest wall: No tenderness.  Abdominal:     General: There is no distension.     Palpations: Abdomen is soft. There is no mass.     Tenderness: There is no abdominal tenderness. There is no right CVA tenderness, left CVA tenderness, guarding or rebound.     Hernia: No hernia is present.  Musculoskeletal:     Cervical back: Neck supple.     Right lower leg: No edema.     Left lower leg: No edema.     Comments: No peripheral edema  Skin:    General: Skin is warm.      Capillary Refill: Capillary refill takes less than 2 seconds.     Coloration: Skin is not jaundiced or pale.     Findings: No bruising  or rash.  Neurological:     Mental Status: She is alert.  Psychiatric:        Behavior: Behavior normal.     ED Results / Procedures / Treatments   Labs (all labs ordered are listed, but only abnormal results are displayed) Labs Reviewed  CBC - Abnormal; Notable for the following components:      Result Value   WBC 11.8 (*)    RBC 3.05 (*)    Hemoglobin 10.7 (*)    HCT 32.4 (*)    MCV 106.2 (*)    MCH 35.1 (*)    RDW 20.4 (*)    All other components within normal limits  COMPREHENSIVE METABOLIC PANEL - Abnormal; Notable for the following components:   Glucose, Bld 134 (*)    BUN 29 (*)    Creatinine, Ser 1.52 (*)    Calcium 8.8 (*)    GFR calc non Af Amer 29 (*)    GFR calc Af Amer 34 (*)    All other components within normal limits  BRAIN NATRIURETIC PEPTIDE - Abnormal; Notable for the following components:   B Natriuretic Peptide 214.9 (*)    All other components within normal limits  D-DIMER, QUANTITATIVE (NOT AT [facility]) - Abnormal; Notable for the following components:   D-Dimer, Quant 1.47 (*)    All other components within normal limits  BASIC METABOLIC PANEL - Abnormal; Notable for the following components:   CO2 21 (*)    Glucose, Bld 132 (*)    BUN 28 (*)    Creatinine, Ser 1.25 (*)    Calcium 8.6 (*)    GFR calc non Af Amer 37 (*)    GFR calc Af Amer 43 (*)    All other components within normal limits  TROPONIN I (HIGH SENSITIVITY) - Abnormal; Notable for the following components:   Troponin I (High Sensitivity) 70 (*)    All other components within normal limits  TROPONIN I (HIGH SENSITIVITY) - Abnormal; Notable for the following components:   Troponin I (High Sensitivity) 75 (*)    All other components within normal limits  TROPONIN I (HIGH SENSITIVITY) - Abnormal; Notable for the following components:   Troponin I (High  Sensitivity) 73 (*)    All other components within normal limits  SARS CORONAVIRUS 2 BY RT PCR (HOSPITAL ORDER, [facility])  MRSA PCR SCREENING  LIPASE, BLOOD  APTT  PROTIME-INR  HEPARIN LEVEL (UNFRACTIONATED)  CBG MONITORING, ED  TROPONIN I (HIGH SENSITIVITY)    EKG EKG Interpretation  Date/Time:  Wednesday October 05 2019 22:22:12 EDT Ventricular Rate:  86 PR Interval:    QRS Duration: 106 QT Interval:  387 QTC Calculation: 463 R Axis:   62 Text Interpretation: Sinus rhythm Atrial premature complex Repol abnrm suggests ischemia, lateral leads Baseline wander in lead(s) V1 Since last tracing diffuse ST depression Otherwise no significant change Confirmed by Daleen Bo [phone]) on 10/05/2019 11:04:31 PM   Radiology CT Head Wo Contrast  Result Date: 10/06/2019 CLINICAL DATA:  Right-sided headache EXAM: CT HEAD WITHOUT CONTRAST TECHNIQUE: Contiguous axial images were obtained from the base of the skull through the vertex without intravenous contrast. COMPARISON:  01/20/2012 FINDINGS: Brain: No evidence of acute infarction, hemorrhage, hydrocephalus, extra-axial collection or mass lesion/mass effect. Brain atrophy especially notable at the sylvian fissures and occipital parietal sulci. Chronic small vessel ischemia throughout the deep cerebral white matter. Vascular: No hyperdense vessel or unexpected calcification. Skull: Normal. Negative for fracture  or focal lesion. Bilateral TMJ osteoarthritis Sinuses/Orbits: No acute finding. IMPRESSION: Senescent changes without acute or reversible finding. Electronically Signed   By: Monte Fantasia M.D.   On: 10/06/2019 04:06   DG Chest Portable 1 View  Result Date: 10/05/2019 CLINICAL DATA:  Shortness of breath. EXAM: PORTABLE CHEST 1 VIEW COMPARISON:  October 20, 2016 FINDINGS: Stable mild diffuse chronic appearing increased lung markings are seen. Mild to moderate severity areas of atelectasis and/or infiltrate are seen  within the bilateral lung bases. Small bilateral pleural effusions are noted. No pneumothorax is identified. The cardiac silhouette is mildly enlarged. Multilevel degenerative changes seen throughout the thoracic spine. IMPRESSION: 1. Mild to moderate severity areas of atelectasis and/or infiltrate within the bilateral lung bases. 2. Small bilateral pleural effusions. Electronically Signed   By: Virgina Norfolk M.D.   On: 10/05/2019 22:44    Procedures .Critical Care Performed by: Joanne Gavel, PA-C Authorized by: Joanne Gavel, PA-C   Critical care provider statement:    Critical care time (minutes):  55   Critical care time was exclusive of:  Separately billable procedures and treating other patients and teaching time   Critical care was necessary to treat or prevent imminent or life-threatening deterioration of the following conditions:  Cardiac failure   Critical care was time spent personally by me on the following activities:  Re-evaluation of patient's condition, pulse oximetry, ordering and review of radiographic studies, ordering and review of laboratory studies, ordering and performing treatments and interventions, review of old charts, obtaining history from patient or surrogate, examination of patient, evaluation of patient's response to treatment and development of treatment plan with patient or surrogate   I assumed direction of critical care for this patient from another provider in my specialty: no     (including critical care time)  Medications Ordered in ED Medications  nitroGLYCERIN (NITROSTAT) SL tablet 0.4 mg (0.4 mg Sublingual Given 10/06/19 0424)  aspirin EC tablet 81 mg (has no administration in time range)  acetaminophen (TYLENOL) tablet 650 mg (has no administration in time range)  ondansetron (ZOFRAN) injection 4 mg (has no administration in time range)  levothyroxine (SYNTHROID) tablet 100 mcg (100 mcg Oral Given 10/06/19 0601)  pregabalin (LYRICA) capsule 50 mg  (has no administration in time range)  senna-docusate (Senokot-S) tablet 2 tablet (has no administration in time range)  zolpidem (AMBIEN) tablet 5 mg (has no administration in time range)  HYDROcodone-acetaminophen (NORCO/VICODIN) 5-325 MG per tablet 0.5-1 tablet (has no administration in time range)  multivitamin with minerals tablet 1 tablet (has no administration in time range)  heparin bolus via infusion 3,500 Units (3,500 Units Intravenous Bolus from Bag 10/06/19 0600)    Followed by  heparin ADULT infusion 100 units/mL (25000 units/26mL sodium chloride 0.45%) (1,000 Units/hr Intravenous New Bag/Given 10/06/19 0600)  amLODipine (NORVASC) tablet 5 mg (has no administration in time range)    And  benazepril (LOTENSIN) tablet 20 mg (has no administration in time range)  Chlorhexidine Gluconate Cloth 2 % PADS 6 each (has no administration in time range)  chlorhexidine (PERIDEX) 0.12 % solution 15 mL (has no administration in time range)  MEDLINE mouth rinse (has no administration in time range)  aspirin chewable tablet 324 mg (324 mg Oral Given 10/06/19 0022)  sodium chloride 0.9 % bolus 500 mL (0 mLs Intravenous Stopped 10/06/19 0304)    ED Course  I have reviewed the triage vital signs and the nursing notes.  Pertinent labs & imaging results that were  available during my care of the patient were reviewed by me and considered in my medical decision making (see chart for details).    MDM Rules/Calculators/A&P                          85 year old female with a history of pancreatitis, HLD, HTN, hypothyroidism, chronic anemia, aortic stenosis, chronic low back pain, and PAD who presents to the emergency department with progressively worsening shortness of breath over the last week that acutely worsened tonight.  Patient is adamant that she has not had any chest pain or discomfort accompanying her symptoms.  Notably, when her symptoms worsened earlier tonight family reported that she was very  ill-appearing with intermittent flushing and pallor.  She has also been feeling generally weak over the last week and has been having intermittent right-sided headaches.  EMS reported patient's heart rate was in the 130s on arrival and blood pressure was 170/100, but these improved in route and the patient reports that she felt markedly better after she was given sublingual NTG.  In the ER, patient is stable and well-appearing.  Heart rate is 98 and she is normotensive.  She is on 2 L nasal cannula, but EMS noted that she was only satting at 92% on room air when they arrived.  She has had no documented hypoxia in the ER.  She is not tachypneic.  On initial evaluation, she had scattered wheezes and crackles in the bilateral bases, but on repeat exams this appears to have cleared.  There is no JVD or peripheral edema to suggest acute CHF exacerbation and BNP is 215.  EKG was reviewed by Dr. Eulis Foster, attending physician, who independently saw and evaluated the patient.  EKG suggestive of ischemia in the lateral leads with sinus rhythm.  Initial troponin is elevated at 70, but repeat troponin is flat 75, which makes NSTEMI less likely although the patient did report marked improvement after receiving nitroglycerin in route.  She does have a new AKI and given that she has been having new headaches, shortness of breath, and blood pressure was elevated on arrival, but has now normalized, could also consider hypertensive urgency.  Chest x-ray with mild to moderate areas of atelectasis and/or infiltrate within the bilateral lung bases and small bilateral pleural effusions.  Question an episode of flash pulmonary edema in the setting of hypertensive urgency?  She did recently have several squamous cell cancer lesions removed by dermatology, which increases her risk of shortness of breath being due to PE.  However, GFR is 29 and she is not a candidate for a PE study.  Will consider V/Q study.  She also did have some  very, very mild aortic dilatation on her echo, but less likely dissection.  Doubt COPD exacerbation or asthma.  Discussed the patient with Dr. Ailene Ravel, attending physician, reiterated that patient has been examined multiple times and has adamantly denied any kind of chest pain, discomfort since arrival, but did have significant improvement with nitroglycerin.  Cardiology will follow the patient recommends medical admission.  He does not recommend any other interventions at this time or starting the patient on a heparin drip given that troponin is flat.  Patient was also endorsing headaches for the last week.  Head CT was unremarkable and neuro exam is without deficit.  She also did receive a 500 cc fluid bolus for AKI.  Consulted the hospitalist team and Dr. Alcario Drought will accept the patient for admission. The patient  appears reasonably stabilized for admission considering the current resources, flow, and capabilities available in the ED at this time, and I doubt any other [facility] requiring further screening and/or treatment in the ED prior to admission.   Final Clinical Impression(s) / ED Diagnoses Final diagnoses:  None    Rx / DC Orders ED Discharge Orders    None       Shyne Resch A, PA-C 10/06/19 3428    Daleen Bo, MD 10/06/19 1043

## 2019-10-05 NOTE — ED Provider Notes (Signed)
  Face-to-face evaluation   History: She presents for evaluation of shortness of breath, ongoing for 6 days, and worsening.  Treated seen by EMS found to be an respiratory distress and was treated with CPAP.  She was also given nitroglycerin sublingual, and oxygen, and after that she felt better.  No prior coronary disease.  She does have aortic stenosis.  Physical exam: Patient alert and cooperative.  She appears comfortable.  She is on nasal cannula oxygen.  Lungs with few scattered rales which improved with deep breathing.  Medical screening examination/treatment/procedure(s) were conducted as a shared visit with non-physician practitioner(s) and myself.  I personally evaluated the patient during the encounter    Daleen Bo, MD 10/06/19 1043

## 2019-10-06 ENCOUNTER — Observation Stay (HOSPITAL_COMMUNITY): Payer: Medicare Other

## 2019-10-06 ENCOUNTER — Emergency Department (HOSPITAL_COMMUNITY): Payer: Medicare Other

## 2019-10-06 ENCOUNTER — Other Ambulatory Visit: Payer: Self-pay

## 2019-10-06 DIAGNOSIS — R0789 Other chest pain: Secondary | ICD-10-CM | POA: Diagnosis not present

## 2019-10-06 DIAGNOSIS — D649 Anemia, unspecified: Secondary | ICD-10-CM | POA: Diagnosis not present

## 2019-10-06 DIAGNOSIS — I10 Essential (primary) hypertension: Secondary | ICD-10-CM | POA: Diagnosis not present

## 2019-10-06 DIAGNOSIS — I5021 Acute systolic (congestive) heart failure: Secondary | ICD-10-CM | POA: Diagnosis present

## 2019-10-06 DIAGNOSIS — I5031 Acute diastolic (congestive) heart failure: Secondary | ICD-10-CM

## 2019-10-06 DIAGNOSIS — I739 Peripheral vascular disease, unspecified: Secondary | ICD-10-CM | POA: Diagnosis present

## 2019-10-06 DIAGNOSIS — G629 Polyneuropathy, unspecified: Secondary | ICD-10-CM | POA: Diagnosis present

## 2019-10-06 DIAGNOSIS — I2510 Atherosclerotic heart disease of native coronary artery without angina pectoris: Secondary | ICD-10-CM | POA: Diagnosis not present

## 2019-10-06 DIAGNOSIS — E039 Hypothyroidism, unspecified: Secondary | ICD-10-CM | POA: Diagnosis present

## 2019-10-06 DIAGNOSIS — G8929 Other chronic pain: Secondary | ICD-10-CM | POA: Diagnosis present

## 2019-10-06 DIAGNOSIS — I25768 Atherosclerosis of bypass graft of coronary artery of transplanted heart with other forms of angina pectoris: Secondary | ICD-10-CM | POA: Diagnosis not present

## 2019-10-06 DIAGNOSIS — Z79890 Hormone replacement therapy (postmenopausal): Secondary | ICD-10-CM | POA: Diagnosis not present

## 2019-10-06 DIAGNOSIS — Z801 Family history of malignant neoplasm of trachea, bronchus and lung: Secondary | ICD-10-CM | POA: Diagnosis not present

## 2019-10-06 DIAGNOSIS — N1831 Chronic kidney disease, stage 3a: Secondary | ICD-10-CM | POA: Diagnosis present

## 2019-10-06 DIAGNOSIS — J811 Chronic pulmonary edema: Secondary | ICD-10-CM | POA: Diagnosis not present

## 2019-10-06 DIAGNOSIS — I350 Nonrheumatic aortic (valve) stenosis: Secondary | ICD-10-CM

## 2019-10-06 DIAGNOSIS — I2584 Coronary atherosclerosis due to calcified coronary lesion: Secondary | ICD-10-CM | POA: Diagnosis present

## 2019-10-06 DIAGNOSIS — Z79899 Other long term (current) drug therapy: Secondary | ICD-10-CM | POA: Diagnosis not present

## 2019-10-06 DIAGNOSIS — E872 Acidosis: Secondary | ICD-10-CM | POA: Diagnosis present

## 2019-10-06 DIAGNOSIS — R519 Headache, unspecified: Secondary | ICD-10-CM | POA: Diagnosis not present

## 2019-10-06 DIAGNOSIS — M545 Low back pain: Secondary | ICD-10-CM | POA: Diagnosis present

## 2019-10-06 DIAGNOSIS — I130 Hypertensive heart and chronic kidney disease with heart failure and stage 1 through stage 4 chronic kidney disease, or unspecified chronic kidney disease: Secondary | ICD-10-CM | POA: Diagnosis present

## 2019-10-06 DIAGNOSIS — Z7982 Long term (current) use of aspirin: Secondary | ICD-10-CM | POA: Diagnosis not present

## 2019-10-06 DIAGNOSIS — R0603 Acute respiratory distress: Secondary | ICD-10-CM | POA: Diagnosis present

## 2019-10-06 DIAGNOSIS — N179 Acute kidney failure, unspecified: Secondary | ICD-10-CM | POA: Diagnosis present

## 2019-10-06 DIAGNOSIS — I25110 Atherosclerotic heart disease of native coronary artery with unstable angina pectoris: Secondary | ICD-10-CM | POA: Diagnosis present

## 2019-10-06 DIAGNOSIS — E785 Hyperlipidemia, unspecified: Secondary | ICD-10-CM | POA: Diagnosis present

## 2019-10-06 DIAGNOSIS — Z66 Do not resuscitate: Secondary | ICD-10-CM | POA: Diagnosis present

## 2019-10-06 DIAGNOSIS — D539 Nutritional anemia, unspecified: Secondary | ICD-10-CM | POA: Diagnosis present

## 2019-10-06 DIAGNOSIS — D631 Anemia in chronic kidney disease: Secondary | ICD-10-CM | POA: Diagnosis present

## 2019-10-06 DIAGNOSIS — Z20822 Contact with and (suspected) exposure to covid-19: Secondary | ICD-10-CM | POA: Diagnosis present

## 2019-10-06 DIAGNOSIS — J90 Pleural effusion, not elsewhere classified: Secondary | ICD-10-CM | POA: Diagnosis not present

## 2019-10-06 DIAGNOSIS — M810 Age-related osteoporosis without current pathological fracture: Secondary | ICD-10-CM | POA: Diagnosis present

## 2019-10-06 LAB — APTT: aPTT: 26 seconds (ref 24–36)

## 2019-10-06 LAB — BASIC METABOLIC PANEL
Anion gap: 11 (ref 5–15)
BUN: 28 mg/dL — ABNORMAL HIGH (ref 8–23)
CO2: 21 mmol/L — ABNORMAL LOW (ref 22–32)
Calcium: 8.6 mg/dL — ABNORMAL LOW (ref 8.9–10.3)
Chloride: 110 mmol/L (ref 98–111)
Creatinine, Ser: 1.25 mg/dL — ABNORMAL HIGH (ref 0.44–1.00)
GFR calc Af Amer: 43 mL/min — ABNORMAL LOW (ref >60)
GFR calc non Af Amer: 37 mL/min — ABNORMAL LOW (ref >60)
Glucose, Bld: 132 mg/dL — ABNORMAL HIGH (ref 70–99)
Potassium: 3.8 mmol/L (ref 3.5–5.1)
Sodium: 142 mmol/L (ref 135–145)

## 2019-10-06 LAB — D-DIMER, QUANTITATIVE: D-Dimer, Quant: 1.47 ug/mL-FEU — ABNORMAL HIGH (ref 0.00–0.50)

## 2019-10-06 LAB — ECHOCARDIOGRAM COMPLETE
Height: 66 in
Weight: 2640 oz

## 2019-10-06 LAB — HEPARIN LEVEL (UNFRACTIONATED): Heparin Unfractionated: 0.27 IU/mL — ABNORMAL LOW (ref 0.30–0.70)

## 2019-10-06 LAB — CBG MONITORING, ED: Glucose-Capillary: 99 mg/dL (ref 70–99)

## 2019-10-06 LAB — PROTIME-INR
INR: 1 (ref 0.8–1.2)
Prothrombin Time: 13 seconds (ref 11.4–15.2)

## 2019-10-06 LAB — MRSA PCR SCREENING: MRSA by PCR: NEGATIVE

## 2019-10-06 LAB — TROPONIN I (HIGH SENSITIVITY)
Troponin I (High Sensitivity): 75 ng/L — ABNORMAL HIGH (ref <18)
Troponin I (High Sensitivity): 73 ng/L — ABNORMAL HIGH (ref <18)
Troponin I (High Sensitivity): 80 ng/L — ABNORMAL HIGH (ref <18)

## 2019-10-06 LAB — SARS CORONAVIRUS 2 BY RT PCR (HOSPITAL ORDER, PERFORMED IN ~~LOC~~ HOSPITAL LAB): SARS Coronavirus 2: NEGATIVE

## 2019-10-06 MED ORDER — ORAL CARE MOUTH RINSE
15.0000 mL | Freq: Two times a day (BID) | OROMUCOSAL | Status: DC
  Administered 2019-10-06 – 2019-10-10 (×5): 15 mL via OROMUCOSAL

## 2019-10-06 MED ORDER — ZOLPIDEM TARTRATE 5 MG PO TABS
5.0000 mg | ORAL_TABLET | Freq: Every evening | ORAL | Status: DC | PRN
  Administered 2019-10-06 – 2019-10-11 (×8): 5 mg via ORAL
  Filled 2019-10-06 (×8): qty 1

## 2019-10-06 MED ORDER — ACETAMINOPHEN 325 MG PO TABS: 650.0000 mg | ORAL_TABLET | ORAL | Status: DC | PRN

## 2019-10-06 MED ORDER — ONDANSETRON HCL 4 MG/2ML IJ SOLN: 4.0000 mg | Freq: Four times a day (QID) | INTRAMUSCULAR | Status: DC | PRN

## 2019-10-06 MED ORDER — LEVOTHYROXINE SODIUM 100 MCG PO TABS
100.0000 ug | ORAL_TABLET | Freq: Every day | ORAL | Status: DC
  Administered 2019-10-06 – 2019-10-11 (×6): 100 ug via ORAL
  Filled 2019-10-06 (×6): qty 1

## 2019-10-06 MED ORDER — SODIUM CHLORIDE 0.9 % WEIGHT BASED INFUSION
3.0000 mL/kg/h | INTRAVENOUS | Status: DC
  Administered 2019-10-07: 3 mL/kg/h via INTRAVENOUS

## 2019-10-06 MED ORDER — PREGABALIN 50 MG PO CAPS
50.0000 mg | ORAL_CAPSULE | Freq: Every day | ORAL | Status: DC
  Administered 2019-10-06 – 2019-10-11 (×5): 50 mg via ORAL
  Filled 2019-10-06 (×5): qty 1

## 2019-10-06 MED ORDER — SODIUM CHLORIDE 0.9% FLUSH
3.0000 mL | Freq: Two times a day (BID) | INTRAVENOUS | Status: DC
  Administered 2019-10-06 – 2019-10-10 (×6): 3 mL via INTRAVENOUS

## 2019-10-06 MED ORDER — AMLODIPINE BESY-BENAZEPRIL HCL 5-20 MG PO CAPS: 1.0000 | ORAL_CAPSULE | Freq: Every day | ORAL | Status: DC

## 2019-10-06 MED ORDER — SODIUM CHLORIDE 0.9% FLUSH: 3.0000 mL | INTRAVENOUS | Status: DC | PRN

## 2019-10-06 MED ORDER — ASPIRIN 81 MG PO CHEW
81.0000 mg | CHEWABLE_TABLET | ORAL | Status: AC
  Administered 2019-10-07: 81 mg via ORAL
  Filled 2019-10-06: qty 1

## 2019-10-06 MED ORDER — SODIUM CHLORIDE 0.9 % IV SOLN: 250.0000 mL | INTRAVENOUS | Status: DC | PRN

## 2019-10-06 MED ORDER — HEPARIN (PORCINE) 25000 UT/250ML-% IV SOLN
1200.0000 [IU]/h | INTRAVENOUS | Status: DC
  Filled 2019-10-06: qty 250

## 2019-10-06 MED ORDER — CHLORHEXIDINE GLUCONATE 0.12 % MT SOLN
15.0000 mL | Freq: Two times a day (BID) | OROMUCOSAL | Status: DC
  Administered 2019-10-06 – 2019-10-10 (×7): 15 mL via OROMUCOSAL
  Filled 2019-10-06 (×7): qty 15

## 2019-10-06 MED ORDER — HEPARIN BOLUS VIA INFUSION
3500.0000 [IU] | Freq: Once | INTRAVENOUS | Status: AC
  Administered 2019-10-06: 3500 [IU] via INTRAVENOUS
  Filled 2019-10-06: qty 3500

## 2019-10-06 MED ORDER — GUAIFENESIN-DM 100-10 MG/5ML PO SYRP
5.0000 mL | ORAL_SOLUTION | ORAL | Status: DC | PRN
  Administered 2019-10-06: 5 mL via ORAL
  Filled 2019-10-06: qty 10

## 2019-10-06 MED ORDER — NITROGLYCERIN 0.4 MG SL SUBL
0.4000 mg | SUBLINGUAL_TABLET | SUBLINGUAL | Status: DC | PRN
  Administered 2019-10-06: 0.4 mg via SUBLINGUAL
  Filled 2019-10-06: qty 1

## 2019-10-06 MED ORDER — SODIUM CHLORIDE 0.9 % WEIGHT BASED INFUSION
1.0000 mL/kg/h | INTRAVENOUS | Status: DC
  Administered 2019-10-07: 1 mL/kg/h via INTRAVENOUS

## 2019-10-06 MED ORDER — HYDROCODONE-ACETAMINOPHEN 5-325 MG PO TABS
0.5000 | ORAL_TABLET | ORAL | Status: DC | PRN
  Administered 2019-10-06: 0.5 via ORAL
  Administered 2019-10-06 (×2): 1 via ORAL
  Administered 2019-10-07: 0.5 via ORAL
  Administered 2019-10-07 – 2019-10-08 (×2): 1 via ORAL
  Administered 2019-10-08 – 2019-10-09 (×3): 0.5 via ORAL
  Administered 2019-10-10: 1 via ORAL
  Filled 2019-10-06 (×10): qty 1

## 2019-10-06 MED ORDER — LEVALBUTEROL HCL 0.63 MG/3ML IN NEBU
0.6300 mg | INHALATION_SOLUTION | RESPIRATORY_TRACT | Status: DC | PRN
  Administered 2019-10-06 – 2019-10-07 (×2): 0.63 mg via RESPIRATORY_TRACT
  Filled 2019-10-06 (×2): qty 3

## 2019-10-06 MED ORDER — CHLORHEXIDINE GLUCONATE CLOTH 2 % EX PADS
6.0000 | MEDICATED_PAD | Freq: Every day | CUTANEOUS | Status: DC
  Administered 2019-10-06 – 2019-10-09 (×2): 6 via TOPICAL

## 2019-10-06 MED ORDER — ACETAMINOPHEN 325 MG PO TABS: 325.0000 mg | ORAL_TABLET | Freq: Four times a day (QID) | ORAL | Status: DC | PRN

## 2019-10-06 MED ORDER — TECHNETIUM TO 99M ALBUMIN AGGREGATED: 4.4000 | Freq: Once | INTRAVENOUS | Status: DC | PRN

## 2019-10-06 MED ORDER — BENAZEPRIL HCL 20 MG PO TABS
20.0000 mg | ORAL_TABLET | Freq: Every day | ORAL | Status: DC
  Administered 2019-10-06: 20 mg via ORAL
  Filled 2019-10-06: qty 1

## 2019-10-06 MED ORDER — SODIUM CHLORIDE 0.9 % IV BOLUS
500.0000 mL | Freq: Once | INTRAVENOUS | Status: AC
  Administered 2019-10-06: 500 mL via INTRAVENOUS

## 2019-10-06 MED ORDER — AMLODIPINE BESYLATE 5 MG PO TABS
5.0000 mg | ORAL_TABLET | Freq: Every day | ORAL | Status: DC
  Administered 2019-10-06 – 2019-10-08 (×2): 5 mg via ORAL
  Filled 2019-10-06 (×2): qty 1

## 2019-10-06 MED ORDER — ASPIRIN EC 81 MG PO TBEC
81.0000 mg | DELAYED_RELEASE_TABLET | Freq: Every day | ORAL | Status: DC
  Administered 2019-10-08 – 2019-10-11 (×4): 81 mg via ORAL
  Filled 2019-10-06 (×4): qty 1

## 2019-10-06 MED ORDER — SENNOSIDES-DOCUSATE SODIUM 8.6-50 MG PO TABS
2.0000 | ORAL_TABLET | Freq: Every day | ORAL | Status: DC | PRN
  Administered 2019-10-07: 2 via ORAL
  Filled 2019-10-06: qty 2

## 2019-10-06 MED ORDER — ADULT MULTIVITAMIN W/MINERALS CH
1.0000 | ORAL_TABLET | Freq: Every day | ORAL | Status: DC
  Administered 2019-10-06 – 2019-10-11 (×5): 1 via ORAL
  Filled 2019-10-06 (×5): qty 1

## 2019-10-06 MED ORDER — HEPARIN (PORCINE) 25000 UT/250ML-% IV SOLN
1000.0000 [IU]/h | INTRAVENOUS | Status: DC
  Administered 2019-10-06: 1000 [IU]/h via INTRAVENOUS
  Filled 2019-10-06: qty 250

## 2019-10-06 NOTE — Progress Notes (Signed)
ANTICOAGULATION CONSULT NOTE - Initial Consult  Pharmacy Consult for Heparin Indication: chest pain/ACS  Allergies  Allergen Reactions  . Statins Other (See Comments)    Leg weakness  . Lidocaine     Per patient  . Metamucil [Psyllium]     Difficulty swallowing after taking    Patient Measurements: Height: 5\' 6"  (167.6 cm) Weight: 74.8 kg (165 lb) IBW/kg (Calculated) : 59.3 HEPARIN DW (KG): 74.3   Vital Signs: Temp: 97.7 F (36.5 C) (07/08 0024) Temp Source: Oral (07/08 0024) BP: 112/57 (07/08 0300) Pulse Rate: 90 (07/08 0300)  Labs: Recent Labs    10/05/19 2217 10/06/19 0027  HGB 10.7*  --   HCT 32.4*  --   PLT 162  --   CREATININE 1.52*  --   TROPONINIHS 70* 75*    Estimated Creatinine Clearance: 23.9 mL/min (A) (by C-G formula based on SCr of 1.52 mg/dL (H)).   Medical History: Past Medical History:  Diagnosis Date  . Acute pancreatitis   . ANEMIA   . ANXIETY   . Aortic stenosis    Echo 06/2019: EF 55-60, no RWMA, mild LVH, normal RV SF, moderate LAE, mild MR, moderate aortic stenosis (mean 20.3 mmHg), mild dilation of aortic root (40 mm)  . Arthritis   . Chronic anemia   . Diverticulosis   . Gastritis   . Hemorrhoids   . Hiatal hernia   . HYPERLIPIDEMIA   . HYPERTENSION   . HYPOTHYROIDISM   . LOW BACK PAIN, CHRONIC   . PAD (peripheral artery disease) ([facility])     Medications:  Infusions:  No anticoagulants PTA  Assessment: 86 yo F with hx CAD presents with worsening shortness of breath.  Troponin mildly elevated. Baseline labs: CBC: Hg slightly low, pltc WNL.  No bleeding noted. Aptt/INR pending.   Goal of Therapy:  Heparin level 0.3-0.7 units/ml Monitor platelets by anticoagulation protocol: Yes   Plan:  Give 3500 units bolus x 1 Start heparin infusion at 1000 units/hr Check anti-Xa level in 8 hours and daily while on heparin Continue to monitor H&H and platelets  Netta Cedars  PharmD 10/06/2019,4:23 AM

## 2019-10-06 NOTE — H&P (View-Only) (Signed)
Cardiology Consultation:   Patient ID: Brittney Gates; [id number]; 01/23/1925   Admit date: 10/05/2019 Date of Consult: 10/06/2019  Primary Care Provider: [name], MD Primary Cardiologist: Brittney Mocha, MD Primary Electrophysiologist:  None   Patient Profile:   Brittney Gates is a 86 y.o. female with a PMH of HTN, HLD, PAD, moderate aortic stenosis (on echo 06/2019), hypothyroidism, and CKD stage 3 who is being seen today for the evaluation of chest pain at the request of Brittney Gates.  History of Present Illness:   Brittney Gates has had SOB and orthopnea for the past few weeks, progressively worsening over the past week. This occurs at rest and with exertion. On the evening of 10/05/19, she had acute worsening of her shortness of breath prompting her son to activate EMS. She was noted to be tachycardic to the 130s on EMS arrival with BP 170/100, and was tachypneic with O2 sats at 92%. She was given 1 SL nitro with rapid improvement in her SOB. She was transported to [facility] ED for further evaluation.   She was last evaluated by cardiology via a telemedicine visit with Brittney Dopp, PA-C 07/06/19 at which time she was doing well without chest pain, SOB, LE edema, or syncope. She was recommended to undergo repeat surveillance echo to monitor her aortic stenosis and follow-up in 1 year. Echo 06/2019 showed EF 55-60%, no RWMA, mild LVH, LV diastolic parameters are c/w age-related delayed relaxation, moderate LAE, mild MR, moderate AS (mean gradient 20.3 mmHg; peak gradient 38.4 mmHg), and mildly dilated aortic root (64mm). Her last ischemic evaluation was a NST in 2019 which was without ischemia. Her son contacted our office 09/27/19 to report patients complaints of SOB and orthopnea, for which Dr. Burt Gates suggested an in-office visit to further evaluate her symptoms; scheduled for a visit with Dr. Burt Gates 10/19/19.   Hospital course: tachycardic to the 110s, intermittently tachypneic, satting in the 90s  on O2 via [place] (2L), otherwise VSS. Labs notable for electrolytes wnl, Cr 1.52>1.25, WBC 11.8, Hgb 10/7, PLT 162, HsTrop 70>75>73>80, BNP 214, Ddimer 1.47. EKG with sinus rhythm with rate 100 bpm, PVC, chronic incomplete LBBB, chronic inferolateral ST-T wave abnormalities. CXR with mild-moderate bilateral lung base atelectasis vs infiltrate with small bilateral pleural effusions. CT head without acute findings. She was admitted to medicine and started on a heparin gtt for possible ACS. Echo pendings. Cardiology asked to see.   Past Medical History:  Diagnosis Date  . Acute pancreatitis   . ANEMIA   . ANXIETY   . Aortic stenosis    Echo 06/2019: EF 55-60, no RWMA, mild LVH, normal RV SF, moderate LAE, mild MR, moderate aortic stenosis (mean 20.3 mmHg), mild dilation of aortic root (40 mm)  . Arthritis   . Chronic anemia   . Diverticulosis   . Gastritis   . Hemorrhoids   . Hiatal hernia   . HYPERLIPIDEMIA   . HYPERTENSION   . HYPOTHYROIDISM   . LOW BACK PAIN, CHRONIC   . PAD (peripheral artery disease) ([place])     Past Surgical History:  Procedure Laterality Date  . ANGIOPLASTY  1995  . APPENDECTOMY    . BACK SURGERY     x's 2  . BREAST SURGERY     Benign  . FEMUR IM NAIL  01/21/2012   Procedure: INTRAMEDULLARY (IM) NAIL FEMORAL;  Surgeon: Newt Minion, MD;  Location: [facility];  Service: Orthopedics;  Laterality: Left;  Intertrochanteric Nail Left Hip  .  HEMORRHOID SURGERY    . IR GENERIC HISTORICAL  05/29/2016   IR SACROPLASTY BILATERAL 05/29/2016 Brittney Bras, MD [facility]  . IR GENERIC HISTORICAL  06/19/2016   IR RADIOLOGIST EVAL & MGMT 06/19/2016 [facility]  . TUBAL LIGATION       Home Medications:  Prior to Admission medications   Medication Sig Start Date End Date Taking? Authorizing Provider  acetaminophen (TYLENOL) 325 MG tablet Take 325 mg by mouth every 6 (six) hours as needed for mild pain, moderate pain or headache.   Yes [provider]    amLODipine-benazepril (LOTREL) 5-20 MG capsule TAKE ONE CAPSULE BY MOUTH DAILY Patient taking differently: Take 1 capsule by mouth daily.  09/30/19  Yes Brittney Koch, MD  aspirin EC 81 MG tablet Take 81 mg by mouth daily.   Yes [provider]  Cholecalciferol 2000 UNITS TABS Take 2,000 Units by mouth daily.   Yes [provider]  HYDROcodone-acetaminophen (NORCO/VICODIN) 5-325 MG tablet Take 1 tablet by mouth every 4 (four) hours as needed for moderate pain. Patient taking differently: Take 0.5-1 tablets by mouth every 4 (four) hours as needed for moderate pain.  09/09/19  Yes Brittney Koch, MD  levothyroxine (SYNTHROID) 100 MCG tablet TAKE ONE TABLET BY MOUTH EVERY MORNING BEFORE BREAKFAST Patient taking differently: Take 100 mcg by mouth daily before breakfast.  09/30/19  Yes Brittney Koch, MD  Multiple Vitamin (MULTIVITAMIN WITH MINERALS) TABS tablet Take 1 tablet by mouth daily.   Yes [provider]  Omega 3-6-9 Fatty Acids (TRIPLE OMEGA-3-6-9 PO) Take 1 capsule by mouth daily.   Yes [provider]  pregabalin (LYRICA) 50 MG capsule TAKE ONE CAPSULE BY MOUTH TWICE A DAY Patient taking differently: Take 50 mg by mouth daily.  10/04/19  Yes Brittney Koch, MD  sennosides-docusate sodium (SENOKOT-S) 8.6-50 MG tablet Take 2 tablets by mouth daily as needed for constipation.    Yes [provider]  zolpidem (AMBIEN) 10 MG tablet TAKE ONE TABLET BY MOUTH EVERY NIGHT AT BEDTIME AS NEEDED FOR SLEEP Patient taking differently: Take 10 mg by mouth at bedtime.  07/29/19  Yes Brittney Koch, MD    Inpatient Medications: Scheduled Meds: . amLODipine  5 mg Oral Daily   And  . benazepril  20 mg Oral Daily  . [START ON 10/07/2019] aspirin EC  81 mg Oral Daily  . chlorhexidine  15 mL Mouth Rinse BID  . Chlorhexidine Gluconate Cloth  6 each Topical Daily  . levothyroxine  100 mcg Oral Q0600  . mouth rinse  15 mL Mouth Rinse  [id number]  . multivitamin with minerals  1 tablet Oral Daily  . pregabalin  50 mg Oral Daily  . zolpidem  5 mg Oral QHS,MR X 1   Continuous Infusions: . heparin 1,000 Units/hr (10/06/19 0630)   PRN Meds: acetaminophen, HYDROcodone-acetaminophen, nitroGLYCERIN, ondansetron (ZOFRAN) IV, senna-docusate  Allergies:    Allergies  Allergen Reactions  . Statins Other (See Comments)    Leg weakness  . Lidocaine     Per patient  . Metamucil [Psyllium]     Difficulty swallowing after taking    Social History:   Social History   Socioeconomic History  . Marital status: Widowed    Spouse name: Not on file  . Number of children: 4  . Years of education: Not on file  . Highest education level: Not on file  Occupational History  . Occupation: retired  Tobacco Use  . Smoking  status: Never Smoker  . Smokeless tobacco: Never Used  Vaping Use  . Vaping Use: Never used  Substance and Sexual Activity  . Alcohol use: No    Alcohol/week: 0.0 standard drinks  . Drug use: No  . Sexual activity: Not on file  Other Topics Concern  . 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 Difficulty of Paying Living Expenses:   Food Insecurity:   . Worried About Charity fundraiser in the Last Year:   . Arboriculturist in the Last Year:   Transportation Needs:   . Film/video editor (Medical):   Marland Kitchen Lack of Transportation (Non-Medical):   Physical Activity:   . Days of Exercise per Week:   . Minutes of Exercise per Session:   Stress:   . Feeling of Stress :   Social Connections:   . Frequency of Communication with Friends and Family:   . Frequency of Social Gatherings with Friends and Family:   . Attends Religious Services:   . Active Member  of Clubs or Organizations:   . Attends Archivist Meetings:   Marland Kitchen Marital Status:   Intimate Partner Violence:   . Fear of Current or Ex-Partner:   . Emotionally Abused:   Marland Kitchen Physically Abused:   . Sexually Abused:     Family History:    Family History  Problem Relation Age of Onset  . Lung cancer Father   . Gallbladder disease Son   . Gallbladder disease Daughter   . Colon cancer Neg Hx      ROS:  Please see the history of present illness.  ROS  All other ROS reviewed and negative.     Physical Exam/Data:   Vitals:   10/06/19 0426 10/06/19 0441 10/06/19 0515 10/06/19 0612  BP:   105/75 135/65  Pulse: (!) 104 (!) 105 77   Resp: (!) 21 (!) 23 (!) 23 18  Temp:   97.8 F (36.6 C)   TempSrc:   Oral   SpO2: 92% 99% 99% 99%  Weight:      Height:        Intake/Output Summary (Last 24 hours) at 10/06/2019 0749 Last data filed at 10/06/2019 0630 Gross per 24 hour  Intake 544.02 ml  Output --  Net 544.02 ml   Filed Weights   10/06/19 0022  Weight: 74.8 kg   Body mass index is 26.63 kg/m.  General:  Well nourished, well developed, in no acute distress. Lying completely flat in bed HEENT: sclera anicteric  Lymph: no adenopathy Neck: no JVD Endocrine:  No thryomegaly Vascular: Bilateral carotid bruits; distal pulses 2+ bilaterally Cardiac:  normal S1, S2; RRR; no diastolic murmur, 3/6 early-to-mid peaking AS murmur. Thready pedal pulses on left side Lungs:  clear to auscultation bilaterally, no wheezing, rhonchi or rales  Abd: NABS, soft, nontender, no hepatomegaly Ext: no edema Musculoskeletal:  No deformities, BUE and BLE strength normal and equal Skin: warm and dry  Neuro:  CNs 2-12 intact, no focal abnormalities noted Psych:  Normal affect   EKG:  The EKG was personally reviewed and demonstrates:  sinus rhythm with rate 100 bpm, PVC, chronic incomplete LBBB, chronic inferolateral ST-T wave abnormalities  Telemetry:  Telemetry was personally reviewed and  demonstrates:  NSR, occ PVCs  Relevant CV Studies: Echocardiogram 06/2019: 1. Left ventricular ejection fraction, by estimation, is 55 to 60%. The  left ventricle has normal function. The left ventricle has no regional  wall motion abnormalities. There is mild left ventricular hypertrophy.  Left ventricular diastolic parameters  are consistent with age-related delayed relaxation (normal).  2. Right ventricular systolic function is normal. The right ventricular  size is normal.  3. Left atrial size was moderately dilated.  4. The mitral valve is degenerative. Mild mitral valve regurgitation. No  evidence of mitral stenosis.  5. AS gradients stable since echo done 10/26/18 . The aortic valve is  tricuspid. Aortic valve regurgitation is not visualized. Moderate aortic  valve stenosis.  6. Aortic dilatation noted. There is mild dilatation of the aortic root  measuring 40 mm.  7. The inferior vena cava is normal in size with greater than 50%  respiratory variability, suggesting right atrial pressure of 3 mmHg.   Long-term cardiac monitor 10/2018: NSR, avg HR 71 Occ PVCs, ventricular couplets, rare vent run (5 beats) No sig bradycardia, pathologic pauses Occ supraventricular ectopics, short paroxysms of SVT No AFib, Flutter  Myoview 09/10/2017: EF 45, small fixed mid inferoseptal defect, probably artifact. Otherwise normal perfusion.  ABIs 10/2015: Known bilateral SFA disease, left greater than right Right ABI stable and in normal range; left ABI stable and in mid range  Laboratory Data:  Chemistry Recent Labs  Lab 10/05/19 2217 10/06/19 0448  NA 142 142  K 4.1 3.8  CL 109 110  CO2 22 21*  GLUCOSE 134* 132*  BUN 29* 28*  CREATININE 1.52* 1.25*  CALCIUM 8.8* 8.6*  GFRNONAA 29* 37*  GFRAA 34* 43*  ANIONGAP 11 11    Recent Labs  Lab 10/05/19 2217  PROT 6.8  ALBUMIN 3.7  AST 19  ALT 15  ALKPHOS 61  BILITOT 0.5   Hematology Recent Labs  Lab 10/05/19 2217    WBC 11.8*  RBC 3.05*  HGB 10.7*  HCT 32.4*  MCV 106.2*  MCH 35.1*  MCHC 33.0  RDW 20.4*  PLT 162   Cardiac EnzymesNo results for input(s): TROPONINI in the last 168 hours. No results for input(s): TROPIPOC in the last 168 hours.  BNP Recent Labs  Lab 10/05/19 2254  BNP 214.9*    DDimer  Recent Labs  Lab 10/06/19 0448  DDIMER 1.47*    Radiology/Studies:  CT Head Wo Contrast  Result Date: 10/06/2019 CLINICAL DATA:  Right-sided headache EXAM: CT HEAD WITHOUT CONTRAST TECHNIQUE: Contiguous axial images were obtained from the base of the skull through the vertex without intravenous contrast. COMPARISON:  01/20/2012 FINDINGS: Brain: No evidence of acute infarction, hemorrhage, hydrocephalus, extra-axial collection or mass lesion/mass effect. Brain atrophy especially notable at the sylvian fissures and occipital parietal sulci. Chronic small vessel ischemia throughout the deep cerebral white matter. Vascular: No hyperdense vessel or unexpected calcification. Skull: Normal. Negative for fracture or focal lesion. Bilateral TMJ osteoarthritis Sinuses/Orbits: No acute finding. IMPRESSION: Senescent changes without acute or reversible finding. Electronically Signed   By: Monte Fantasia M.D.   On: 10/06/2019 04:06   DG Chest Portable 1 View  Result Date: 10/05/2019 CLINICAL DATA:  Shortness of breath. EXAM: PORTABLE CHEST 1 VIEW COMPARISON:  October 20, 2016 FINDINGS: Stable mild diffuse chronic appearing increased lung markings are seen. Mild to moderate severity areas of atelectasis and/or infiltrate are seen within the bilateral lung bases. Small bilateral pleural  effusions are noted. No pneumothorax is identified. The cardiac silhouette is mildly enlarged. Multilevel degenerative changes seen throughout the thoracic spine. IMPRESSION: 1. Mild to moderate severity areas of atelectasis and/or infiltrate within the bilateral lung bases. 2. Small bilateral pleural effusions. Electronically Signed    By: Virgina Norfolk M.D.   On: 10/05/2019 22:44    Assessment and Plan:   1. SOB/DOE:  Patient has had progressive SOB/DOE for the past several weeks. EKG was non-ischemic with chronic ST-T wave abnormalities in inferolateral leads. CXR with small bilateral pleural effusions. BNP in the 200s. HsTrop in the 70s-80 with low flat trend not c/w ACS. DDimer elevated to 1.47 (elevated even by age adjusted standards). SOB was improved with SL nitro. Echo 06/2019 with normal LV function, age related diastolic dysfunction, and moderate aortic stenosis. Her last ischemic evaluation was a NST in 2019 which was without ischemia. Repeat echo is pending. She was started on a heparin gtt overnight for possible ACS. Etiology still remains unclear.  - Will follow-up repeat echo  2. HTN: BP generally stable - Continue home amlodipine and benazepril  3. Aortic stenosis: moderate on echo 06/2019. Unlikely this has progressed to severe since that time.  - Will follow-up repeat echo  4. HLD: LDL 135 08/2019. Intolerant to statins - Consider zetia  5. PAD: patient with known SFA disease. Stable on last LEAs/ABIs - Continue aspirin - Consider addition of zetia for HLD  6. AoCKD stage 3: Cr 1.5 on admission, down to 1.25 today; baseline 1.1.      For questions or updates, please contact [facility] Please consult [email or web address] for contact info under Cardiology/STEMI.   Signed, Abigail Butts, PA-C  10/06/2019 7:49 AM [phone]  I have seen and examined the patient along with Abigail Butts, PA-C .  I have reviewed the chart, notes and new data.  I agree with PA/NP's note.  Key new complaints: onset of dyspnea was gradual. She has had intermittent chest heaviness (a longstanding complaint), but no severe or prolonged anginal pain. Breathing is now markedly improved and she can lie flat. Key examination changes: JVP normal, RRR, 3/6 early-to-mid peaking systolic ejection murmur that radiates to  the carotids, clear lungs, no JVD Key new findings / data: echo shows a distinct change in LV function with lateral hypokinesis and marked reduction in LVEF (about 35%). Troponin abnormality is minimal. ECG shows more prominent lateral ST depression and T wave inversion compared to 2020 (but not that different from 2019 tracing). Low risk nuclear scan in 2019.  PLAN: Seems to have severe ischemia in the left circumflex artery distribution. (takotsubo syndrome appears less likely given the gradual onset and the wall motion pattern). Cannot exclude an acute infarction that occurred > 7-14 days ago, but there are no new Q waves on ECG. Has known PAD and likely to have extensive (and probably calcific) CAD. Aortic stenosis is moderate, but unchanged from earlier this year. She is quite functional for her age and is cognitively intact. While I do not think she is a good candidate for CABG+AVR, I believe she is a reasonable candidate for cardiac cath and PCI, given the appropriate anatomy. Discussed these procedures in detail with the patient and with her sons [name] and Herbie Baltimore. They asked several pertinent questions, which I answered, although I had to speculate about the likely coronary anatomy and outcomes. It is quite possible that the anatomy may not allow safe percutaneous revascularization. Reviewed potential complications, including risk for nephrotoxicity.  They are in general agreement to go ahead with the procedure, but will talk it over amongst themselves some more this evening. Tentatively scheduled for heart catheterization and Possible PCI-stent at 1200h tomorrow w Dr. Irish Lack. Will hydrate cautiously before the procedure.  Sanda Klein, MD, [place] [phone] 10/06/2019, 2:00 PM

## 2019-10-06 NOTE — H&P (Signed)
History and Physical    Brittney Gates IHK:[id number] DOB: May 24, 1924 DOA: 10/05/2019  PCP: [name], MD  Patient coming from: Home  I have personally briefly reviewed patient's old medical records in [place]  Chief Complaint: SOB, chest pressure  HPI: Brittney Gates is a 86 y.o. female with medical history significant of HTN, mod AS.  2d echo in April: nl EF, mod AS.  Pt presents to ED with c/o SOB and chest pressure.  SOB both at rest and exertion.  Does have orthopnea for past couple of weeks as well.  SOB had been coming and going a couple of weeks ago but worsened over the past week.  Acutely worsening tonight.  Associated L chest "pressure" located under L breast.  Worse with deep breaths.  Son gave 8 baby ASA at home.  EMS called.  Per EMS initial HR in 130s.  BP 170/100, satting 92% with tachypnea.  Given SL NGT x1 and had rapid and marked improvement in SOB as well as the L chest pressure (essentially resolved).   ED Course: Trops 70 and 75.  BNP 214.9.  CXR showing Bi basilar infiltrates vs atelectasis.  Trace B pleural effusions.  WBC 11.8, no fever.  Creat 1.5.  Pt re-developed SOB and chest pressure again in ED a few hours later.  BP this time was ~756E-332R systolic only.  Spoke with cards:  Heparin gtt ordered.  Gave pt another SL NTG with once again resolution of symptoms.  EKG shows ST depressions and TWI in inferior and lateral leads, these however appear to be chronic and were present back in 2019.   Review of Systems: As per HPI, otherwise all review of systems negative.  Past Medical History:  Diagnosis Date  . Acute pancreatitis   . ANEMIA   . ANXIETY   . Aortic stenosis    Echo 06/2019: EF 55-60, no RWMA, mild LVH, normal RV SF, moderate LAE, mild MR, moderate aortic stenosis (mean 20.3 mmHg), mild dilation of aortic root (40 mm)  . Arthritis   . Chronic anemia   . Diverticulosis   . Gastritis   . Hemorrhoids   . Hiatal  hernia   . HYPERLIPIDEMIA   . HYPERTENSION   . HYPOTHYROIDISM   . LOW BACK PAIN, CHRONIC   . PAD (peripheral artery disease) ([place])     Past Surgical History:  Procedure Laterality Date  . ANGIOPLASTY  1995  . APPENDECTOMY    . BACK SURGERY     x's 2  . BREAST SURGERY     Benign  . FEMUR IM NAIL  01/21/2012   Procedure: INTRAMEDULLARY (IM) NAIL FEMORAL;  Surgeon: Newt Minion, MD;  Location: [facility];  Service: Orthopedics;  Laterality: Left;  Intertrochanteric Nail Left Hip  . HEMORRHOID SURGERY    . IR GENERIC HISTORICAL  05/29/2016   IR SACROPLASTY BILATERAL 05/29/2016 [name], MD [facility]  . IR GENERIC HISTORICAL  06/19/2016   IR RADIOLOGIST EVAL & MGMT 06/19/2016 [facility]  . TUBAL LIGATION       reports that she has never smoked. She has never used smokeless tobacco. She reports that she does not drink alcohol and does not use drugs.  Allergies  Allergen Reactions  . Statins Other (See Comments)    Leg weakness  . Lidocaine     Per patient  . Metamucil [Psyllium]     Difficulty swallowing after taking    Family History  Problem Relation  Age of Onset  . Lung cancer Father   . Gallbladder disease Son   . Gallbladder disease Daughter   . Colon cancer Neg Hx      Prior to Admission medications   Medication Sig Start Date End Date Taking? Authorizing Provider  acetaminophen (TYLENOL) 325 MG tablet Take 325 mg by mouth every 6 (six) hours as needed for mild pain, moderate pain or headache.   Yes [provider]  amLODipine-benazepril (LOTREL) 5-20 MG capsule TAKE ONE CAPSULE BY MOUTH DAILY Patient taking differently: Take 1 capsule by mouth daily.  09/30/19  Yes [name], MD  aspirin EC 81 MG tablet Take 81 mg by mouth daily.   Yes [provider]  Cholecalciferol 2000 UNITS TABS Take 2,000 Units by mouth daily.   Yes [provider]  HYDROcodone-acetaminophen (NORCO/VICODIN) 5-325 MG tablet Take 1 tablet by mouth  every 4 (four) hours as needed for moderate pain. Patient taking differently: Take 0.5-1 tablets by mouth every 4 (four) hours as needed for moderate pain.  09/09/19  Yes [name], MD  levothyroxine (SYNTHROID) 100 MCG tablet TAKE ONE TABLET BY MOUTH EVERY MORNING BEFORE BREAKFAST Patient taking differently: Take 100 mcg by mouth daily before breakfast.  09/30/19  Yes [name], MD  Multiple Vitamin (MULTIVITAMIN WITH MINERALS) TABS tablet Take 1 tablet by mouth daily.   Yes [provider]  Omega 3-6-9 Fatty Acids (TRIPLE OMEGA-3-6-9 PO) Take 1 capsule by mouth daily.   Yes [provider]  pregabalin (LYRICA) 50 MG capsule TAKE ONE CAPSULE BY MOUTH TWICE A DAY Patient taking differently: Take 50 mg by mouth daily.  10/04/19  Yes [name], MD  sennosides-docusate sodium (SENOKOT-S) 8.6-50 MG tablet Take 2 tablets by mouth daily as needed for constipation.    Yes [provider]  zolpidem (AMBIEN) 10 MG tablet TAKE ONE TABLET BY MOUTH EVERY NIGHT AT BEDTIME AS NEEDED FOR SLEEP Patient taking differently: Take 10 mg by mouth at bedtime.  07/29/19  Yes [name], MD    Physical Exam: Vitals:   10/06/19 0356 10/06/19 0411 10/06/19 0426 10/06/19 0441  BP: (!) 149/82     Pulse: (!) 43 (!) 117 (!) 104 (!) 105  Resp: (!) 22 20 (!) 21 (!) 23  Temp:      TempSrc:      SpO2: (!) 89% 95% 92% 99%  Weight:      Height:        Constitutional: NAD, calm, comfortable Eyes: PERRL, lids and conjunctivae normal ENMT: Mucous membranes are moist. Posterior pharynx clear of any exudate or lesions.Normal dentition.  Neck: normal, supple, no masses, no thyromegaly Respiratory: clear to auscultation bilaterally, no wheezing, no crackles. Normal respiratory effort. No accessory muscle use.  Cardiovascular: Regular rate and rhythm, no murmurs / rubs / gallops. No extremity edema. 2+ pedal pulses. No carotid bruits.  Abdomen: no tenderness,  no masses palpated. No hepatosplenomegaly. Bowel sounds positive.  Musculoskeletal: no clubbing / cyanosis. No joint deformity upper and lower extremities. Good ROM, no contractures. Normal muscle tone.  Skin: no rashes, lesions, ulcers. No induration Neurologic: CN 2-12 grossly intact. Sensation intact, DTR normal. Strength 5/5 in all 4.  Psychiatric: Normal judgment and insight. Alert and oriented x 3. Normal mood.    Labs on Admission: I have personally reviewed following labs and imaging studies  CBC: Recent Labs  Lab 10/05/19 2217  WBC 11.8*  HGB 10.7*  HCT 32.4*  MCV  106.2*  PLT 213   Basic Metabolic Panel: Recent Labs  Lab 10/05/19 2217  NA 142  K 4.1  CL 109  CO2 22  GLUCOSE 134*  BUN 29*  CREATININE 1.52*  CALCIUM 8.8*   GFR: Estimated Creatinine Clearance: 23.9 mL/min (A) (by C-G formula based on SCr of 1.52 mg/dL (H)). Liver Function Tests: Recent Labs  Lab 10/05/19 2217  AST 19  ALT 15  ALKPHOS 61  BILITOT 0.5  PROT 6.8  ALBUMIN 3.7   Recent Labs  Lab 10/05/19 2229  LIPASE 23   No results for input(s): AMMONIA in the last 168 hours. Coagulation Profile: No results for input(s): INR, PROTIME in the last 168 hours. Cardiac Enzymes: No results for input(s): CKTOTAL, CKMB, CKMBINDEX, TROPONINI in the last 168 hours. BNP (last 3 results) No results for input(s): PROBNP in the last 8760 hours. HbA1C: No results for input(s): HGBA1C in the last 72 hours. CBG: Recent Labs  Lab 10/05/19 2247  GLUCAP 99   Lipid Profile: No results for input(s): CHOL, HDL, LDLCALC, TRIG, CHOLHDL, LDLDIRECT in the last 72 hours. Thyroid Function Tests: No results for input(s): TSH, T4TOTAL, FREET4, T3FREE, THYROIDAB in the last 72 hours. Anemia Panel: No results for input(s): VITAMINB12, FOLATE, FERRITIN, TIBC, IRON, RETICCTPCT in the last 72 hours. Urine analysis:    Component Value Date/Time   COLORURINE YELLOW 07/12/2016 1649   APPEARANCEUR HAZY (A)  07/12/2016 1649   LABSPEC 1.017 07/12/2016 1649   PHURINE 6.0 07/12/2016 1649   GLUCOSEU NEGATIVE 07/12/2016 1649   GLUCOSEU NEGATIVE 10/30/2014 1208   HGBUR SMALL (A) 07/12/2016 1649   BILIRUBINUR Neg 07/23/2017 1604   KETONESUR NEGATIVE 07/12/2016 1649   PROTEINUR 1+ 07/23/2017 1604   PROTEINUR NEGATIVE 07/12/2016 1649   UROBILINOGEN 0.2 07/23/2017 1604   UROBILINOGEN 0.2 10/30/2014 1208   NITRITE Neg 07/23/2017 1604   NITRITE NEGATIVE 07/12/2016 1649   LEUKOCYTESUR Large (3+) (A) 07/23/2017 1604    Radiological Exams on Admission: CT Head Wo Contrast  Result Date: 10/06/2019 CLINICAL DATA:  Right-sided headache EXAM: CT HEAD WITHOUT CONTRAST TECHNIQUE: Contiguous axial images were obtained from the base of the skull through the vertex without intravenous contrast. COMPARISON:  01/20/2012 FINDINGS: Brain: No evidence of acute infarction, hemorrhage, hydrocephalus, extra-axial collection or mass lesion/mass effect. Brain atrophy especially notable at the sylvian fissures and occipital parietal sulci. Chronic small vessel ischemia throughout the deep cerebral white matter. Vascular: No hyperdense vessel or unexpected calcification. Skull: Normal. Negative for fracture or focal lesion. Bilateral TMJ osteoarthritis Sinuses/Orbits: No acute finding. IMPRESSION: Senescent changes without acute or reversible finding. Electronically Signed   By: Monte Fantasia M.D.   On: 10/06/2019 04:06   DG Chest Portable 1 View  Result Date: 10/05/2019 CLINICAL DATA:  Shortness of breath. EXAM: PORTABLE CHEST 1 VIEW COMPARISON:  October 20, 2016 FINDINGS: Stable mild diffuse chronic appearing increased lung markings are seen. Mild to moderate severity areas of atelectasis and/or infiltrate are seen within the bilateral lung bases. Small bilateral pleural effusions are noted. No pneumothorax is identified. The cardiac silhouette is mildly enlarged. Multilevel degenerative changes seen throughout the thoracic spine.  IMPRESSION: 1. Mild to moderate severity areas of atelectasis and/or infiltrate within the bilateral lung bases. 2. Small bilateral pleural effusions. Electronically Signed   By: Virgina Norfolk M.D.   On: 10/05/2019 22:44    EKG: Independently reviewed.  Assessment/Plan Principal Problem:   Left chest pressure Active Problems:   Essential hypertension    1. L  chest pressure - 1. ACS vs CHF 2. ACS pathway for now 3. Cards to see in AM: 1. Heparin gtt per cards rec 2. Cards on call didn't rec moving her over to [facility] just yet 4. Got ASA already 5. Serial trops 6. NPO except sips with meds till cards sees 7. Ordered repeat 2d echo 8. Tele monitor 2. HTN - 1. Cont home BP meds 3. Mod aortic stenosis -  DVT prophylaxis: Heparin gtt Code Status: DNR - confirmed with pt Family Communication: Spoke with Netta Cedars on phone [phone] Disposition Plan: Home after diagnosis and treatment of chest pressure / SOB Consults called: Spoke with Dr. Ailene Ravel, cards to see in AM Admission status: Place in 8    [place], [place] [facility]  How to contact the [facility] Attending or Consulting provider [place] or covering provider during after hours [place], for this patient?  1. Check the care team in [facility] and look for a) attending/consulting [facility] provider listed and b) the [facility] team listed 2. Log into [email or web address]  Amion Physician Scheduling and messaging for groups and whole hospitals  On call and physician scheduling software for group practices, residents, hospitalists and other medical providers for call, clinic, rotation and shift schedules. On[employer] is a hospital-wide system for scheduling doctors and paging doctors on call. EasyPlot is for scientific plotting and data analysis.  [email or web address]  and use [place]'s universal password to access. If you do not have the password, please contact the hospital operator.  3. Locate the [facility] provider you are looking for under  [facility] and page to a number that you can be directly reached. 4. If you still have difficulty reaching the provider, please page the [facility] (Director on Call) for the Hospitalists listed on amion for assistance.  10/06/2019, 5:01 AM

## 2019-10-06 NOTE — Progress Notes (Signed)
Echocardiogram 2D Echocardiogram has been performed.  Oneal Deputy Yi Haugan 10/06/2019, 8:47 AM

## 2019-10-06 NOTE — Progress Notes (Signed)
PROGRESS NOTE    Brittney Gates  OVZ:[id number] DOB: 01/21/1925 DOA: 10/05/2019 PCP: [name], MD   Chef Complaints: Chest pain shortness of breath  Brief Narrative: 86 year old female with history of hypertension, moderate aortic stenosis echo in April with normal EF, hypothyroidism, chronic anemia, chronic low back pain, PAD who came to the ED for evaluation of shortness of breath.  Symptom onset with mild shortness of breath 2 weeks ago but constant past 1 week son noted clammy with intermittent pallor and flushing before EMS arrived when she became acutely more short of breath, son gave 8 baby aspirin, symptoms improved with nitroglycerin.  She also complained of nonproductive cough, PND, and 1 week of intermittent sharp right-sided headache  In the ED, troponin 70, 75 BNP 214, D-dimer 1.4 ,chest x-ray with bibasilar infiltrates versus atelectasis trace pleural effusions, mild leukocytosis creatinine elevated 1.5 patient had recurrence of episode of shortness of breath and chest pressure in the ED BP was in 120s 30s and symptom improved with nitroglycerin.  Cardiology discussed, EKG showed ST depression anteriorly nursing inferior and lateral leads which appear to be present in previous EKG in 2019.  On-call cardiology was consulted and patient was placed on heparin drip and admitted.  Subjective:  Patient seen and examined personally, I reviewed the chart, history and physical and admission note, done by admitting physician this morning and agree with the same with following addendum.  Please refer to the morning admission note for more detailed plan of care.  This morning patient having coughing spells when short of breath during that time. When resting no pain. Chest pain on left side worse on deep breath. Sons at bedside, both appear very concerned. On 2-3l [place] at 95% No leg swelling or calf pain.  Assessment & Plan:  Left chest pressure, shortness of breath suspecting ACS,  rule out PE/CHF:EKG with ST-T wave changes but present on previous EKG, chest x-ray mild to moderate severity areas of atelectasis and/or infiltrate within the basal bilateral lung.  Symptoms improving with nitroglycerin concerning for ACS, troponin elevated, but flat, cardiology consulted and patient is on heparin infusion, aspirin. bnp at 214 ?CHF.  Noted positive D-dimer at 1.4 but unable to do CTA due to her renal dysfunction- will do perfusion scan-perfusion scan reviewed and no evidence of pulmonary embolism. Echo being done now. CXR repeated this am. Will keep on prn xoponex/antitussives-as pt endorses cough/wheezing on deep breath.  Cardiology recommendation reviewed noted plan for possible cardiac cath due to concern for acute coronary syndrome severe ischemia.  AKI on CKD IIa baseline creatinine 1.1 in June/01/2020.  1.5 on admission this morning at 1.2.   Recent Labs  Lab 10/05/19 2217 10/06/19 0448  BUN 29* 28*  CREATININE 1.52* 4.12*   Mild metabolic acidosis with bicarb 21.  Essential hypertension on amlodipine and lisinopril at home.  BP is controlled.cont same.  Moderate aortic stenosis by echo in April with normal EF, f/u echo  Hypothyroidism on Synthroid at home.  Chronic low back pain takes Norco,cotn same.  Neuropathy with left leg pain on lyrica   DVT prophylaxis: Heparin infusion Code Status: DNR Family Communication: plan of care discussed with patient and her sons at bedside.  Status is: Admitted as observation Patient remains hospitalized for ongoing management of chest pain further evaluation by cardiology, echocardiogram remains on anticoagulation with heparin infusion.  She will remain in the hospital for at least two midnights.  Dispo: The patient is from: Home  Anticipated d/c is to: Home              Anticipated d/c date is: 2 days              Patient currently is not medically stable to d/c.  Nutrition: Diet Order            Diet NPO  time specified Except for: Sips with Meds  Diet effective now                   Body mass index is 26.63 kg/m.  Consultants:see note  Procedures:see note Microbiology:see note  Medications: Scheduled Meds: . amLODipine  5 mg Oral Daily   And  . benazepril  20 mg Oral Daily  . [START ON 10/07/2019] aspirin EC  81 mg Oral Daily  . chlorhexidine  15 mL Mouth Rinse BID  . Chlorhexidine Gluconate Cloth  6 each Topical Daily  . levothyroxine  100 mcg Oral Q0600  . mouth rinse  15 mL Mouth Rinse q12n4p  . multivitamin with minerals  1 tablet Oral Daily  . pregabalin  50 mg Oral Daily  . zolpidem  5 mg Oral QHS,MR X 1   Continuous Infusions: . heparin 1,000 Units/hr (10/06/19 0630)    Antimicrobials: Anti-infectives (From admission, onward)   None       Objective: Vitals: Today's Vitals   10/06/19 0441 10/06/19 0515 10/06/19 0612 10/06/19 0800  BP:  105/75 135/65   Pulse: (!) 105 77    Resp: (!) 23 (!) 23 18   Temp:  97.8 F (36.6 C)  98.3 F (36.8 C)  TempSrc:  Oral  Oral  SpO2: 99% 99% 99%   Weight:      Height:      PainSc:  0-No pain 0-No pain     Intake/Output Summary (Last 24 hours) at 10/06/2019 0853 Last data filed at 10/06/2019 0630 Gross per 24 hour  Intake 544.02 ml  Output --  Net 544.02 ml   Filed Weights   10/06/19 0022  Weight: 74.8 kg   Weight change:    Intake/Output from previous day: 07/07 0701 - 07/08 0700 In: 544 [I.V.:44; IV Piggyback:500] Out: -  Intake/Output this shift: No intake/output data recorded.  Examination:  General exam: AAOx3, not in distress, calm,weak appearing. HEENT:Oral mucosa moist, Ear/Nose WNL grossly,dentition normal. Respiratory system: bilaterally clear- wheezes on deep breath/cough, no use of accessory muscle, non tender. Cardiovascular system: S1 & S2 +, regular, No JVD. Gastrointestinal system: Abdomen soft, NT,ND, BS+. Nervous System:Alert, awake, moving extremities and grossly  nonfocal Extremities: No edema, distal peripheral pulses palpable.  Skin: No rashes,no icterus. MSK: Normal muscle bulk,tone, power  Data Reviewed: I have personally reviewed following labs and imaging studies CBC: Recent Labs  Lab 10/05/19 2217  WBC 11.8*  HGB 10.7*  HCT 32.4*  MCV 106.2*  PLT 478   Basic Metabolic Panel: Recent Labs  Lab 10/05/19 2217 10/06/19 0448  NA 142 142  K 4.1 3.8  CL 109 110  CO2 22 21*  GLUCOSE 134* 132*  BUN 29* 28*  CREATININE 1.52* 1.25*  CALCIUM 8.8* 8.6*   GFR: Estimated Creatinine Clearance: 29.1 mL/min (A) (by C-G formula based on SCr of 1.25 mg/dL (H)). Liver Function Tests: Recent Labs  Lab 10/05/19 2217  AST 19  ALT 15  ALKPHOS 61  BILITOT 0.5  PROT 6.8  ALBUMIN 3.7   Recent Labs  Lab 10/05/19 2229  LIPASE 23   No results  for input(s): AMMONIA in the last 168 hours. Coagulation Profile: Recent Labs  Lab 10/06/19 0448  INR 1.0   Cardiac Enzymes: No results for input(s): CKTOTAL, CKMB, CKMBINDEX, TROPONINI in the last 168 hours. BNP (last 3 results) No results for input(s): PROBNP in the last 8760 hours. HbA1C: No results for input(s): HGBA1C in the last 72 hours. CBG: Recent Labs  Lab 10/05/19 2247  GLUCAP 99   Lipid Profile: No results for input(s): CHOL, HDL, LDLCALC, TRIG, CHOLHDL, LDLDIRECT in the last 72 hours. Thyroid Function Tests: No results for input(s): TSH, T4TOTAL, FREET4, T3FREE, THYROIDAB in the last 72 hours. Anemia Panel: No results for input(s): VITAMINB12, FOLATE, FERRITIN, TIBC, IRON, RETICCTPCT in the last 72 hours. Sepsis Labs: No results for input(s): PROCALCITON, LATICACIDVEN in the last 168 hours.  Recent Results (from the past 240 hour(s))  SARS Coronavirus 2 by RT PCR (hospital order, performed in [facility] hospital lab) Nasopharyngeal Nasopharyngeal Swab     Status: None   Collection Time: 10/06/19 12:27 AM   Specimen: Nasopharyngeal Swab  Result Value Ref Range Status    SARS Coronavirus 2 NEGATIVE NEGATIVE Final    Comment: (NOTE) SARS-CoV-2 target nucleic acids are NOT DETECTED.  The SARS-CoV-2 RNA is generally detectable in upper and lower respiratory specimens during the acute phase of infection. The lowest concentration of SARS-CoV-2 viral copies this assay can detect is 250 copies / mL. A negative result does not preclude SARS-CoV-2 infection and should not be used as the sole basis for treatment or other patient management decisions.  A negative result may occur with improper specimen collection / handling, submission of specimen other than nasopharyngeal swab, presence of viral mutation(s) within the areas targeted by this assay, and inadequate number of viral copies (<250 copies / mL). A negative result must be combined with clinical observations, patient history, and epidemiological information.  Fact Sheet for Patients:   StrictlyIdeas.no  Fact Sheet for Healthcare Providers: BankingDealers.co.za  This test is not yet approved or  cleared by the [place] FDA and has been authorized for detection and/or diagnosis of SARS-CoV-2 by FDA under an Emergency Use Authorization (EUA).  This EUA will remain in effect (meaning this test can be used) for the duration of the COVID-19 declaration under Section 564(b)(1) of the Act, 21 U.S.C. section 360bbb-3(b)(1), unless the authorization is terminated or revoked sooner.  Performed at [facility], [place] [address] [place], [place] [address]   MRSA PCR Screening     Status: None   Collection Time: 10/06/19  5:43 AM   Specimen: Nasopharyngeal  Result Value Ref Range Status   MRSA by PCR NEGATIVE NEGATIVE Final    Comment:        The GeneXpert MRSA Assay (FDA approved for NASAL specimens only), is one component of a comprehensive MRSA colonization surveillance program. It is not intended to diagnose MRSA infection nor to  guide or monitor treatment for MRSA infections. Performed at [facility], [place] [address] [place], [place] [address]       Radiology Studies: CT Head Wo Contrast  Result Date: 10/06/2019 CLINICAL DATA:  Right-sided headache EXAM: CT HEAD WITHOUT CONTRAST TECHNIQUE: Contiguous axial images were obtained from the base of the skull through the vertex without intravenous contrast. COMPARISON:  01/20/2012 FINDINGS: Brain: No evidence of acute infarction, hemorrhage, hydrocephalus, extra-axial collection or mass lesion/mass effect. Brain atrophy especially notable at the sylvian fissures and occipital parietal sulci. Chronic small vessel ischemia throughout the  deep cerebral white matter. Vascular: No hyperdense vessel or unexpected calcification. Skull: Normal. Negative for fracture or focal lesion. Bilateral TMJ osteoarthritis Sinuses/Orbits: No acute finding. IMPRESSION: Senescent changes without acute or reversible finding. Electronically Signed   By: Monte Fantasia M.D.   On: 10/06/2019 04:06   DG Chest Port 1 View  Result Date: 10/06/2019 CLINICAL DATA:  Malaise, hypertension, aortic stenosis EXAM: PORTABLE CHEST 1 VIEW COMPARISON:  10/05/2019 FINDINGS: Bibasilar pulmonary infiltrates noted on prior examination have significantly improved and trace residual interstitial infiltrate is noted within the lung bases bilaterally. The relatively rapid improvement suggests that this represents edema on prior examination. The lungs are well expanded and are symmetric. No pneumothorax or pleural effusion. Cardiac size within normal limits. No acute bone abnormality. IMPRESSION: Marked interval improvement in probable pulmonary edema noted on prior examination. Trace pulmonary edema persists. Electronically Signed   By: Fidela Salisbury MD   On: 10/06/2019 08:40   DG Chest Portable 1 View  Result Date: 10/05/2019 CLINICAL DATA:  Shortness of breath. EXAM: PORTABLE CHEST 1 VIEW COMPARISON:   October 20, 2016 FINDINGS: Stable mild diffuse chronic appearing increased lung markings are seen. Mild to moderate severity areas of atelectasis and/or infiltrate are seen within the bilateral lung bases. Small bilateral pleural effusions are noted. No pneumothorax is identified. The cardiac silhouette is mildly enlarged. Multilevel degenerative changes seen throughout the thoracic spine. IMPRESSION: 1. Mild to moderate severity areas of atelectasis and/or infiltrate within the bilateral lung bases. 2. Small bilateral pleural effusions. Electronically Signed   By: Virgina Norfolk M.D.   On: 10/05/2019 22:44     LOS: 0 days   Antonieta Pert, MD [facility]  10/06/2019, 8:53 AM

## 2019-10-06 NOTE — Progress Notes (Signed)
ANTICOAGULATION CONSULT NOTE -  Consult  Pharmacy Consult for Heparin Indication: chest pain/ACS  Allergies  Allergen Reactions  . Statins Other (See Comments)    Leg weakness  . Lidocaine     Per patient  . Metamucil [Psyllium]     Difficulty swallowing after taking    Patient Measurements: Height: 5\' 6"  (167.6 cm) Weight: 74.8 kg (165 lb) IBW/kg (Calculated) : 59.3 HEPARIN DW (KG): 74.3   Vital Signs: Temp: 98.1 F (36.7 C) (07/08 1200) Temp Source: Oral (07/08 1200) BP: 123/61 (07/08 1100) Pulse Rate: 113 (07/08 1000)  Labs: Recent Labs    10/05/19 2217 10/05/19 2217 10/06/19 0027 10/06/19 0448 10/06/19 0652 10/06/19 1415  HGB 10.7*  --   --   --   --   --   HCT 32.4*  --   --   --   --   --   PLT 162  --   --   --   --   --   APTT  --   --   --  26  --   --   LABPROT  --   --   --  13.0  --   --   INR  --   --   --  1.0  --   --   HEPARINUNFRC  --   --   --   --   --  0.27*  CREATININE 1.52*  --   --  1.25*  --   --   TROPONINIHS 70*   < > 75* 73* 80*  --    < > = values in this interval not displayed.    Estimated Creatinine Clearance: 29.1 mL/min (A) (by C-G formula based on SCr of 1.25 mg/dL (H)).   Medical History: Past Medical History:  Diagnosis Date  . Acute pancreatitis   . ANEMIA   . ANXIETY   . Aortic stenosis    Echo 06/2019: EF 55-60, no RWMA, mild LVH, normal RV SF, moderate LAE, mild MR, moderate aortic stenosis (mean 20.3 mmHg), mild dilation of aortic root (40 mm)  . Arthritis   . Chronic anemia   . Diverticulosis   . Gastritis   . Hemorrhoids   . Hiatal hernia   . HYPERLIPIDEMIA   . HYPERTENSION   . HYPOTHYROIDISM   . LOW BACK PAIN, CHRONIC   . PAD (peripheral artery disease) (HCC)     Medications:  Infusions:  . heparin 1,000 Units/hr (10/06/19 0900)  No anticoagulants PTA  Assessment: 86 yo F with hx CAD presents with worsening shortness of breath.  Troponin mildly elevated. Baseline labs: CBC: Hg slightly low,  pltc WNL.  No bleeding noted. Aptt/INR WNL.  Today, 10/06/19  HL 0.27, subtherapeutic  No CBC on 7/8  No line or bleeding issues per RN     Goal of Therapy:  Heparin level 0.3-0.7 units/ml Monitor platelets by anticoagulation protocol: Yes   Plan:   Increase heparin to 1200 units/hr  Obtain HL 8 hours after rate change  Daily CBC while on heparin  Monitor for signs and symptoms of bleeding   Royetta Asal, PharmD, BCPS 10/06/2019 2:59 PM

## 2019-10-06 NOTE — Consult Note (Signed)
Cardiology Consultation:   Patient ID: Brittney Gates; [id number]; [birth year]   Admit date: 10/05/2019 Date of Consult: 10/06/2019  Primary Care Provider: [name], MD Primary Cardiologist: Sherren Mocha, MD Primary Electrophysiologist:  None   Patient Profile:   [name] is a 86 y.o. female with a PMH of HTN, HLD, PAD, moderate aortic stenosis (on echo 06/2019), hypothyroidism, and CKD stage 3 who is being seen today for the evaluation of chest pain at the request of Dr. Alcario Drought.  History of Present Illness:   [name] has had SOB and orthopnea for the past few weeks, progressively worsening over the past week. This occurs at rest and with exertion. On the evening of 10/05/19, she had acute worsening of her shortness of breath prompting her son to activate EMS. She was noted to be tachycardic to the 130s on EMS arrival with BP 170/100, and was tachypneic with O2 sats at 92%. She was given 1 SL nitro with rapid improvement in her SOB. She was transported to [facility] ED for further evaluation.   She was last evaluated by cardiology via a telemedicine visit with Richardson Dopp, PA-C 07/06/19 at which time she was doing well without chest pain, SOB, LE edema, or syncope. She was recommended to undergo repeat surveillance echo to monitor her aortic stenosis and follow-up in 1 year. Echo 06/2019 showed EF 55-60%, no RWMA, mild LVH, LV diastolic parameters are c/w age-related delayed relaxation, moderate LAE, mild MR, moderate AS (mean gradient 20.3 mmHg; peak gradient 38.4 mmHg), and mildly dilated aortic root (17mm). Her last ischemic evaluation was a NST in 2019 which was without ischemia. Her son contacted our office 09/27/19 to report patients complaints of SOB and orthopnea, for which Dr. Burt Knack suggested an in-office visit to further evaluate her symptoms; scheduled for a visit with Dr. Burt Knack 10/19/19.   Hospital course: tachycardic to the 110s, intermittently tachypneic, satting in the 90s  on O2 via [place] (2L), otherwise VSS. Labs notable for electrolytes wnl, Cr 1.52>1.25, WBC 11.8, Hgb 10/7, PLT 162, HsTrop 70>75>73>80, BNP 214, Ddimer 1.47. EKG with sinus rhythm with rate 100 bpm, PVC, chronic incomplete LBBB, chronic inferolateral ST-T wave abnormalities. CXR with mild-moderate bilateral lung base atelectasis vs infiltrate with small bilateral pleural effusions. CT head without acute findings. She was admitted to medicine and started on a heparin gtt for possible ACS. Echo pendings. Cardiology asked to see.   Past Medical History:  Diagnosis Date  . Acute pancreatitis   . ANEMIA   . ANXIETY   . Aortic stenosis    Echo 06/2019: EF 55-60, no RWMA, mild LVH, normal RV SF, moderate LAE, mild MR, moderate aortic stenosis (mean 20.3 mmHg), mild dilation of aortic root (40 mm)  . Arthritis   . Chronic anemia   . Diverticulosis   . Gastritis   . Hemorrhoids   . Hiatal hernia   . HYPERLIPIDEMIA   . HYPERTENSION   . HYPOTHYROIDISM   . LOW BACK PAIN, CHRONIC   . PAD (peripheral artery disease) ([place])     Past Surgical History:  Procedure Laterality Date  . ANGIOPLASTY  1995  . APPENDECTOMY    . BACK SURGERY     x's 2  . BREAST SURGERY     Benign  . FEMUR IM NAIL  01/21/2012   Procedure: INTRAMEDULLARY (IM) NAIL FEMORAL;  Surgeon: Newt Minion, MD;  Location: [place];  Service: Orthopedics;  Laterality: Left;  Intertrochanteric Nail Left Hip  .  HEMORRHOID SURGERY    . IR GENERIC HISTORICAL  05/29/2016   IR SACROPLASTY BILATERAL 05/29/2016 [name], MD [facility]  . IR GENERIC HISTORICAL  06/19/2016   IR RADIOLOGIST EVAL & MGMT 06/19/2016 [facility]  . TUBAL LIGATION       Home Medications:  Prior to Admission medications   Medication Sig Start Date End Date Taking? Authorizing Provider  acetaminophen (TYLENOL) 325 MG tablet Take 325 mg by mouth every 6 (six) hours as needed for mild pain, moderate pain or headache.   Yes [provider]    amLODipine-benazepril (LOTREL) 5-20 MG capsule TAKE ONE CAPSULE BY MOUTH DAILY Patient taking differently: Take 1 capsule by mouth daily.  09/30/19  Yes [name], MD  aspirin EC 81 MG tablet Take 81 mg by mouth daily.   Yes [provider]  Cholecalciferol 2000 UNITS TABS Take 2,000 Units by mouth daily.   Yes [provider]  HYDROcodone-acetaminophen (NORCO/VICODIN) 5-325 MG tablet Take 1 tablet by mouth every 4 (four) hours as needed for moderate pain. Patient taking differently: Take 0.5-1 tablets by mouth every 4 (four) hours as needed for moderate pain.  09/09/19  Yes [name], MD  levothyroxine (SYNTHROID) 100 MCG tablet TAKE ONE TABLET BY MOUTH EVERY MORNING BEFORE BREAKFAST Patient taking differently: Take 100 mcg by mouth daily before breakfast.  09/30/19  Yes [name], MD  Multiple Vitamin (MULTIVITAMIN WITH MINERALS) TABS tablet Take 1 tablet by mouth daily.   Yes [provider]  Omega 3-6-9 Fatty Acids (TRIPLE OMEGA-3-6-9 PO) Take 1 capsule by mouth daily.   Yes [provider]  pregabalin (LYRICA) 50 MG capsule TAKE ONE CAPSULE BY MOUTH TWICE A DAY Patient taking differently: Take 50 mg by mouth daily.  10/04/19  Yes [name], MD  sennosides-docusate sodium (SENOKOT-S) 8.6-50 MG tablet Take 2 tablets by mouth daily as needed for constipation.    Yes [provider]  zolpidem (AMBIEN) 10 MG tablet TAKE ONE TABLET BY MOUTH EVERY NIGHT AT BEDTIME AS NEEDED FOR SLEEP Patient taking differently: Take 10 mg by mouth at bedtime.  07/29/19  Yes [name], MD    Inpatient Medications: Scheduled Meds: . amLODipine  5 mg Oral Daily   And  . benazepril  20 mg Oral Daily  . [START ON 10/07/2019] aspirin EC  81 mg Oral Daily  . chlorhexidine  15 mL Mouth Rinse BID  . Chlorhexidine Gluconate Cloth  6 each Topical Daily  . levothyroxine  100 mcg Oral Q0600  . mouth rinse  15 mL Mouth Rinse  [id number]  . multivitamin with minerals  1 tablet Oral Daily  . pregabalin  50 mg Oral Daily  . zolpidem  5 mg Oral QHS,MR X 1   Continuous Infusions: . heparin 1,000 Units/hr (10/06/19 0630)   PRN Meds: acetaminophen, HYDROcodone-acetaminophen, nitroGLYCERIN, ondansetron (ZOFRAN) IV, senna-docusate  Allergies:    Allergies  Allergen Reactions  . Statins Other (See Comments)    Leg weakness  . Lidocaine     Per patient  . Metamucil [Psyllium]     Difficulty swallowing after taking    Social History:   Social History   Socioeconomic History  . Marital status: Widowed    Spouse name: Not on file  . Number of children: 4  . Years of education: Not on file  . Highest education level: Not on file  Occupational History  . Occupation: retired  Tobacco Use  . Smoking  status: Never Smoker  . Smokeless tobacco: Never Used  Vaping Use  . Vaping Use: Never used  Substance and Sexual Activity  . Alcohol use: No    Alcohol/week: 0.0 standard drinks  . Drug use: No  . Sexual activity: Not on file  Other Topics Concern  . 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 Difficulty of Paying Living Expenses:   Food Insecurity:   . Worried About Charity fundraiser in the Last Year:   . Arboriculturist in the Last Year:   Transportation Needs:   . Film/video editor (Medical):   Marland Kitchen Lack of Transportation (Non-Medical):   Physical Activity:   . Days of Exercise per Week:   . Minutes of Exercise per Session:   Stress:   . Feeling of Stress :   Social Connections:   . Frequency of Communication with Friends and Family:   . Frequency of Social Gatherings with Friends and Family:   . Attends Religious Services:   . Active Member  of Clubs or Organizations:   . Attends Archivist Meetings:   Marland Kitchen Marital Status:   Intimate Partner Violence:   . Fear of Current or Ex-Partner:   . Emotionally Abused:   Marland Kitchen Physically Abused:   . Sexually Abused:     Family History:    Family History  Problem Relation Age of Onset  . Lung cancer Father   . Gallbladder disease Son   . Gallbladder disease Daughter   . Colon cancer Neg Hx      ROS:  Please see the history of present illness.  ROS  All other ROS reviewed and negative.     Physical Exam/Data:   Vitals:   10/06/19 0426 10/06/19 0441 10/06/19 0515 10/06/19 0612  BP:   105/75 135/65  Pulse: (!) 104 (!) 105 77   Resp: (!) 21 (!) 23 (!) 23 18  Temp:   97.8 F (36.6 C)   TempSrc:   Oral   SpO2: 92% 99% 99% 99%  Weight:      Height:        Intake/Output Summary (Last 24 hours) at 10/06/2019 0749 Last data filed at 10/06/2019 0630 Gross per 24 hour  Intake 544.02 ml  Output --  Net 544.02 ml   Filed Weights   10/06/19 0022  Weight: 74.8 kg   Body mass index is 26.63 kg/m.  General:  Well nourished, well developed, in no acute distress. Lying completely flat in bed HEENT: sclera anicteric  Lymph: no adenopathy Neck: no JVD Endocrine:  No thryomegaly Vascular: Bilateral carotid bruits; distal pulses 2+ bilaterally Cardiac:  normal S1, S2; RRR; no diastolic murmur, 3/6 early-to-mid peaking AS murmur. Thready pedal pulses on left side Lungs:  clear to auscultation bilaterally, no wheezing, rhonchi or rales  Abd: NABS, soft, nontender, no hepatomegaly Ext: no edema Musculoskeletal:  No deformities, BUE and BLE strength normal and equal Skin: warm and dry  Neuro:  CNs 2-12 intact, no focal abnormalities noted Psych:  Normal affect   EKG:  The EKG was personally reviewed and demonstrates:  sinus rhythm with rate 100 bpm, PVC, chronic incomplete LBBB, chronic inferolateral ST-T wave abnormalities  Telemetry:  Telemetry was personally reviewed and  demonstrates:  NSR, occ PVCs  Relevant CV Studies: Echocardiogram 06/2019: 1. Left ventricular ejection fraction, by estimation, is 55 to 60%. The  left ventricle has normal function. The left ventricle has no regional  wall motion abnormalities. There is mild left ventricular hypertrophy.  Left ventricular diastolic parameters  are consistent with age-related delayed relaxation (normal).  2. Right ventricular systolic function is normal. The right ventricular  size is normal.  3. Left atrial size was moderately dilated.  4. The mitral valve is degenerative. Mild mitral valve regurgitation. No  evidence of mitral stenosis.  5. AS gradients stable since echo done 10/26/18 . The aortic valve is  tricuspid. Aortic valve regurgitation is not visualized. Moderate aortic  valve stenosis.  6. Aortic dilatation noted. There is mild dilatation of the aortic root  measuring 40 mm.  7. The inferior vena cava is normal in size with greater than 50%  respiratory variability, suggesting right atrial pressure of 3 mmHg.   Long-term cardiac monitor 10/2018: NSR, avg HR 71 Occ PVCs, ventricular couplets, rare vent run (5 beats) No sig bradycardia, pathologic pauses Occ supraventricular ectopics, short paroxysms of SVT No AFib, Flutter  Myoview 09/10/2017: EF 45, small fixed mid inferoseptal defect, probably artifact. Otherwise normal perfusion.  ABIs 10/2015: Known bilateral SFA disease, left greater than right Right ABI stable and in normal range; left ABI stable and in mid range  Laboratory Data:  Chemistry Recent Labs  Lab 10/05/19 2217 10/06/19 0448  NA 142 142  K 4.1 3.8  CL 109 110  CO2 22 21*  GLUCOSE 134* 132*  BUN 29* 28*  CREATININE 1.52* 1.25*  CALCIUM 8.8* 8.6*  GFRNONAA 29* 37*  GFRAA 34* 43*  ANIONGAP 11 11    Recent Labs  Lab 10/05/19 2217  PROT 6.8  ALBUMIN 3.7  AST 19  ALT 15  ALKPHOS 61  BILITOT 0.5   Hematology Recent Labs  Lab 10/05/19 2217    WBC 11.8*  RBC 3.05*  HGB 10.7*  HCT 32.4*  MCV 106.2*  MCH 35.1*  MCHC 33.0  RDW 20.4*  PLT 162   Cardiac EnzymesNo results for input(s): TROPONINI in the last 168 hours. No results for input(s): TROPIPOC in the last 168 hours.  BNP Recent Labs  Lab 10/05/19 2254  BNP 214.9*    DDimer  Recent Labs  Lab 10/06/19 0448  DDIMER 1.47*    Radiology/Studies:  CT Head Wo Contrast  Result Date: 10/06/2019 CLINICAL DATA:  Right-sided headache EXAM: CT HEAD WITHOUT CONTRAST TECHNIQUE: Contiguous axial images were obtained from the base of the skull through the vertex without intravenous contrast. COMPARISON:  01/20/2012 FINDINGS: Brain: No evidence of acute infarction, hemorrhage, hydrocephalus, extra-axial collection or mass lesion/mass effect. Brain atrophy especially notable at the sylvian fissures and occipital parietal sulci. Chronic small vessel ischemia throughout the deep cerebral white matter. Vascular: No hyperdense vessel or unexpected calcification. Skull: Normal. Negative for fracture or focal lesion. Bilateral TMJ osteoarthritis Sinuses/Orbits: No acute finding. IMPRESSION: Senescent changes without acute or reversible finding. Electronically Signed   By: Monte Fantasia M.D.   On: 10/06/2019 04:06   DG Chest Portable 1 View  Result Date: 10/05/2019 CLINICAL DATA:  Shortness of breath. EXAM: PORTABLE CHEST 1 VIEW COMPARISON:  October 20, 2016 FINDINGS: Stable mild diffuse chronic appearing increased lung markings are seen. Mild to moderate severity areas of atelectasis and/or infiltrate are seen within the bilateral lung bases. Small bilateral pleural  effusions are noted. No pneumothorax is identified. The cardiac silhouette is mildly enlarged. Multilevel degenerative changes seen throughout the thoracic spine. IMPRESSION: 1. Mild to moderate severity areas of atelectasis and/or infiltrate within the bilateral lung bases. 2. Small bilateral pleural effusions. Electronically Signed    By: Virgina Norfolk M.D.   On: 10/05/2019 22:44    Assessment and Plan:   1. SOB/DOE:  Patient has had progressive SOB/DOE for the past several weeks. EKG was non-ischemic with chronic ST-T wave abnormalities in inferolateral leads. CXR with small bilateral pleural effusions. BNP in the 200s. HsTrop in the 70s-80 with low flat trend not c/w ACS. DDimer elevated to 1.47 (elevated even by age adjusted standards). SOB was improved with SL nitro. Echo 06/2019 with normal LV function, age related diastolic dysfunction, and moderate aortic stenosis. Her last ischemic evaluation was a NST in 2019 which was without ischemia. Repeat echo is pending. She was started on a heparin gtt overnight for possible ACS. Etiology still remains unclear.  - Will follow-up repeat echo  2. HTN: BP generally stable - Continue home amlodipine and benazepril  3. Aortic stenosis: moderate on echo 06/2019. Unlikely this has progressed to severe since that time.  - Will follow-up repeat echo  4. HLD: LDL 135 08/2019. Intolerant to statins - Consider zetia  5. PAD: patient with known SFA disease. Stable on last LEAs/ABIs - Continue aspirin - Consider addition of zetia for HLD  6. AoCKD stage 3: Cr 1.5 on admission, down to 1.25 today; baseline 1.1.      For questions or updates, please contact [place] Please consult [email or web address] for contact info under Cardiology/STEMI.   Signed, Abigail Butts, PA-C  10/06/2019 7:49 AM [phone]  I have seen and examined the patient along with Abigail Butts, PA-C .  I have reviewed the chart, notes and new data.  I agree with PA/NP's note.  Key new complaints: onset of dyspnea was gradual. She has had intermittent chest heaviness (a longstanding complaint), but no severe or prolonged anginal pain. Breathing is now markedly improved and she can lie flat. Key examination changes: JVP normal, RRR, 3/6 early-to-mid peaking systolic ejection murmur that radiates to  the carotids, clear lungs, no JVD Key new findings / data: echo shows a distinct change in LV function with lateral hypokinesis and marked reduction in LVEF (about 35%). Troponin abnormality is minimal. ECG shows more prominent lateral ST depression and T wave inversion compared to 2020 (but not that different from 2019 tracing). Low risk nuclear scan in 2019.  PLAN: Seems to have severe ischemia in the left circumflex artery distribution. (takotsubo syndrome appears less likely given the gradual onset and the wall motion pattern). Cannot exclude an acute infarction that occurred > 7-14 days ago, but there are no new Q waves on ECG. Has known PAD and likely to have extensive (and probably calcific) CAD. Aortic stenosis is moderate, but unchanged from earlier this year. She is quite functional for her age and is cognitively intact. While I do not think she is a good candidate for CABG+AVR, I believe she is a reasonable candidate for cardiac cath and PCI, given the appropriate anatomy. Discussed these procedures in detail with the patient and with her sons Brittney Gates and Brittney Gates. They asked several pertinent questions, which I answered, although I had to speculate about the likely coronary anatomy and outcomes. It is quite possible that the anatomy may not allow safe percutaneous revascularization. Reviewed potential complications, including risk for nephrotoxicity.  They are in general agreement to go ahead with the procedure, but will talk it over amongst themselves some more this evening. Tentatively scheduled for heart catheterization and Possible PCI-stent at 1200h tomorrow w Dr. Irish Lack. Will hydrate cautiously before the procedure.  Sanda Klein, MD, [place] [phone] 10/06/2019, 2:00 PM

## 2019-10-07 ENCOUNTER — Encounter (HOSPITAL_COMMUNITY): Payer: Self-pay | Admitting: Interventional Cardiology

## 2019-10-07 ENCOUNTER — Encounter (HOSPITAL_COMMUNITY)
Admission: EM | Disposition: A | Discharge status: Home Health | Payer: Self-pay | Source: Home / Self Care | Attending: Internal Medicine

## 2019-10-07 DIAGNOSIS — I25110 Atherosclerotic heart disease of native coronary artery with unstable angina pectoris: Principal | ICD-10-CM

## 2019-10-07 DIAGNOSIS — I200 Unstable angina: Secondary | ICD-10-CM

## 2019-10-07 HISTORY — PX: RIGHT/LEFT HEART CATH AND CORONARY ANGIOGRAPHY: CATH118266

## 2019-10-07 LAB — POCT I-STAT 7, (LYTES, BLD GAS, ICA,H+H)
Acid-base deficit: 2 mmol/L (ref 0.0–2.0)
Acid-base deficit: 2 mmol/L (ref 0.0–2.0)
Bicarbonate: 22.5 mmol/L (ref 20.0–28.0)
Bicarbonate: 22.8 mmol/L (ref 20.0–28.0)
Calcium, Ion: 1.22 mmol/L (ref 1.15–1.40)
Calcium, Ion: 1.22 mmol/L (ref 1.15–1.40)
HCT: 30 % — ABNORMAL LOW (ref 36.0–46.0)
HCT: 31 % — ABNORMAL LOW (ref 36.0–46.0)
Hemoglobin: 10.2 g/dL — ABNORMAL LOW (ref 12.0–15.0)
Hemoglobin: 10.5 g/dL — ABNORMAL LOW (ref 12.0–15.0)
O2 Saturation: 89 %
O2 Saturation: 97 %
Potassium: 3.7 mmol/L (ref 3.5–5.1)
Potassium: 3.8 mmol/L (ref 3.5–5.1)
Sodium: 142 mmol/L (ref 135–145)
Sodium: 143 mmol/L (ref 135–145)
TCO2: 24 mmol/L (ref 22–32)
TCO2: 24 mmol/L (ref 22–32)
pCO2 arterial: 38.1 mmHg (ref 32.0–48.0)
pCO2 arterial: 40 mmHg (ref 32.0–48.0)
pH, Arterial: 7.365 (ref 7.350–7.450)
pH, Arterial: 7.379 (ref 7.350–7.450)
pO2, Arterial: 57 mmHg — ABNORMAL LOW (ref 83.0–108.0)
pO2, Arterial: 91 mmHg (ref 83.0–108.0)

## 2019-10-07 LAB — HEPARIN LEVEL (UNFRACTIONATED)
Heparin Unfractionated: 0.53 IU/mL (ref 0.30–0.70)
Heparin Unfractionated: 0.74 IU/mL — ABNORMAL HIGH (ref 0.30–0.70)

## 2019-10-07 LAB — POCT I-STAT EG7
Acid-base deficit: 1 mmol/L (ref 0.0–2.0)
Bicarbonate: 24.2 mmol/L (ref 20.0–28.0)
Calcium, Ion: 1.24 mmol/L (ref 1.15–1.40)
HCT: 31 % — ABNORMAL LOW (ref 36.0–46.0)
Hemoglobin: 10.5 g/dL — ABNORMAL LOW (ref 12.0–15.0)
O2 Saturation: 64 %
Potassium: 3.7 mmol/L (ref 3.5–5.1)
Sodium: 143 mmol/L (ref 135–145)
TCO2: 25 mmol/L (ref 22–32)
pCO2, Ven: 43.2 mmHg — ABNORMAL LOW (ref 44.0–60.0)
pH, Ven: 7.356 (ref 7.250–7.430)
pO2, Ven: 35 mmHg (ref 32.0–45.0)

## 2019-10-07 LAB — CBC
HCT: 29.2 % — ABNORMAL LOW (ref 36.0–46.0)
Hemoglobin: 9.4 g/dL — ABNORMAL LOW (ref 12.0–15.0)
MCH: 34.7 pg — ABNORMAL HIGH (ref 26.0–34.0)
MCHC: 32.2 g/dL (ref 30.0–36.0)
MCV: 107.7 fL — ABNORMAL HIGH (ref 80.0–100.0)
Platelets: 144 10*3/uL — ABNORMAL LOW (ref 150–400)
RBC: 2.71 MIL/uL — ABNORMAL LOW (ref 3.87–5.11)
RDW: 20.6 % — ABNORMAL HIGH (ref 11.5–15.5)
WBC: 9.8 10*3/uL (ref 4.0–10.5)
nRBC: 0.2 % (ref 0.0–0.2)

## 2019-10-07 LAB — BASIC METABOLIC PANEL
Anion gap: 7 (ref 5–15)
BUN: 24 mg/dL — ABNORMAL HIGH (ref 8–23)
CO2: 26 mmol/L (ref 22–32)
Calcium: 8.5 mg/dL — ABNORMAL LOW (ref 8.9–10.3)
Chloride: 107 mmol/L (ref 98–111)
Creatinine, Ser: 1.05 mg/dL — ABNORMAL HIGH (ref 0.44–1.00)
GFR calc Af Amer: 53 mL/min — ABNORMAL LOW (ref >60)
GFR calc non Af Amer: 46 mL/min — ABNORMAL LOW (ref >60)
Glucose, Bld: 115 mg/dL — ABNORMAL HIGH (ref 70–99)
Potassium: 4 mmol/L (ref 3.5–5.1)
Sodium: 140 mmol/L (ref 135–145)

## 2019-10-07 LAB — BRAIN NATRIURETIC PEPTIDE: B Natriuretic Peptide: 344 pg/mL — ABNORMAL HIGH (ref 0.0–100.0)

## 2019-10-07 SURGERY — RIGHT/LEFT HEART CATH AND CORONARY ANGIOGRAPHY
Anesthesia: LOCAL

## 2019-10-07 MED ORDER — FUROSEMIDE 10 MG/ML IJ SOLN
40.0000 mg | Freq: Two times a day (BID) | INTRAMUSCULAR | Status: DC
  Administered 2019-10-07 – 2019-10-08 (×2): 40 mg via INTRAVENOUS
  Filled 2019-10-07 (×2): qty 4

## 2019-10-07 MED ORDER — ONDANSETRON HCL 4 MG/2ML IJ SOLN: 4.0000 mg | Freq: Four times a day (QID) | INTRAMUSCULAR | Status: DC | PRN

## 2019-10-07 MED ORDER — SENNOSIDES-DOCUSATE SODIUM 8.6-50 MG PO TABS
3.0000 | ORAL_TABLET | Freq: Every day | ORAL | Status: DC | PRN
  Administered 2019-10-08 – 2019-10-09 (×2): 3 via ORAL
  Filled 2019-10-07 (×4): qty 3

## 2019-10-07 MED ORDER — SODIUM CHLORIDE 0.9% FLUSH: 3.0000 mL | Freq: Two times a day (BID) | INTRAVENOUS | Status: DC

## 2019-10-07 MED ORDER — HEPARIN SODIUM (PORCINE) 1000 UNIT/ML IJ SOLN
INTRAMUSCULAR | Status: AC
  Filled 2019-10-07: qty 1

## 2019-10-07 MED ORDER — HEPARIN SODIUM (PORCINE) 1000 UNIT/ML IJ SOLN
INTRAMUSCULAR | Status: DC | PRN
  Administered 2019-10-07: 3000 [IU] via INTRAVENOUS

## 2019-10-07 MED ORDER — BUPIVACAINE HCL (PF) 0.25 % IJ SOLN
INTRAMUSCULAR | Status: DC | PRN
  Administered 2019-10-07: 2 mL

## 2019-10-07 MED ORDER — BENAZEPRIL HCL 20 MG PO TABS
20.0000 mg | ORAL_TABLET | Freq: Every day | ORAL | Status: DC
  Administered 2019-10-07 – 2019-10-09 (×3): 20 mg via ORAL
  Filled 2019-10-07 (×4): qty 1

## 2019-10-07 MED ORDER — SODIUM CHLORIDE 0.9 % IV SOLN: INTRAVENOUS | Status: AC

## 2019-10-07 MED ORDER — FENTANYL CITRATE (PF) 100 MCG/2ML IJ SOLN
INTRAMUSCULAR | Status: DC | PRN
  Administered 2019-10-07: 25 ug via INTRAVENOUS

## 2019-10-07 MED ORDER — MIDAZOLAM HCL 2 MG/2ML IJ SOLN
INTRAMUSCULAR | Status: AC
  Filled 2019-10-07: qty 2

## 2019-10-07 MED ORDER — SODIUM CHLORIDE 0.9 % IV SOLN: 250.0000 mL | INTRAVENOUS | Status: DC | PRN

## 2019-10-07 MED ORDER — BUPIVACAINE HCL (PF) 0.25 % IJ SOLN
INTRAMUSCULAR | Status: AC
  Filled 2019-10-07: qty 30

## 2019-10-07 MED ORDER — LABETALOL HCL 5 MG/ML IV SOLN: 10.0000 mg | INTRAVENOUS | Status: AC | PRN

## 2019-10-07 MED ORDER — ASPIRIN EC 81 MG PO TBEC: 81.0000 mg | DELAYED_RELEASE_TABLET | Freq: Every day | ORAL | Status: DC

## 2019-10-07 MED ORDER — FENTANYL CITRATE (PF) 100 MCG/2ML IJ SOLN
INTRAMUSCULAR | Status: AC
  Filled 2019-10-07: qty 2

## 2019-10-07 MED ORDER — HYDRALAZINE HCL 20 MG/ML IJ SOLN: 10.0000 mg | INTRAMUSCULAR | Status: AC | PRN

## 2019-10-07 MED ORDER — IOHEXOL 350 MG/ML SOLN
INTRAVENOUS | Status: DC | PRN
  Administered 2019-10-07: 55 mL via INTRACARDIAC

## 2019-10-07 MED ORDER — ACETAMINOPHEN 325 MG PO TABS: 650.0000 mg | ORAL_TABLET | ORAL | Status: DC | PRN

## 2019-10-07 MED ORDER — VERAPAMIL HCL 2.5 MG/ML IV SOLN
INTRAVENOUS | Status: DC | PRN
  Administered 2019-10-07: 10 mL via INTRA_ARTERIAL

## 2019-10-07 MED ORDER — VERAPAMIL HCL 2.5 MG/ML IV SOLN
INTRAVENOUS | Status: AC
  Filled 2019-10-07: qty 2

## 2019-10-07 MED ORDER — SODIUM CHLORIDE 0.9% FLUSH: 3.0000 mL | INTRAVENOUS | Status: DC | PRN

## 2019-10-07 MED ORDER — HEPARIN (PORCINE) IN NACL 1000-0.9 UT/500ML-% IV SOLN
INTRAVENOUS | Status: DC | PRN
  Administered 2019-10-07 (×2): 500 mL

## 2019-10-07 MED ORDER — MIDAZOLAM HCL 2 MG/2ML IJ SOLN
INTRAMUSCULAR | Status: DC | PRN
  Administered 2019-10-07: 1 mg via INTRAVENOUS

## 2019-10-07 MED ORDER — HEPARIN (PORCINE) IN NACL 1000-0.9 UT/500ML-% IV SOLN
INTRAVENOUS | Status: AC
  Filled 2019-10-07: qty 1000

## 2019-10-07 SURGICAL SUPPLY — 11 items

## 2019-10-07 NOTE — Progress Notes (Signed)
Progress Note  Patient Name: Brittney Gates Date of Encounter: 10/07/2019  Primary Cardiologist: Sherren Mocha, MD   Subjective   Ms. Brittney Gates states she is feeling well after her cath this morning. She is not having significant pain at her right radial cath site. She endorses continued SOB that is improved since admission but still not at baseline. She denies any additional angina.   Inpatient Medications    Scheduled Meds: . [MAR Hold] amLODipine  5 mg Oral Daily  . [MAR Hold] aspirin EC  81 mg Oral Daily  . [MAR Hold] chlorhexidine  15 mL Mouth Rinse BID  . [MAR Hold] Chlorhexidine Gluconate Cloth  6 each Topical Daily  . [MAR Hold] levothyroxine  100 mcg Oral Q0600  . [MAR Hold] mouth rinse  15 mL Mouth Rinse q12n4p  . [MAR Hold] multivitamin with minerals  1 tablet Oral Daily  . [MAR Hold] pregabalin  50 mg Oral Daily  . [MAR Hold] sodium chloride flush  3 mL Intravenous Q12H  . [MAR Hold] zolpidem  5 mg Oral QHS,MR X 1   Continuous Infusions: . sodium chloride    . sodium chloride 75 mL/hr at 10/07/19 0956  . sodium chloride 1 mL/kg/hr (10/07/19 0457)  . heparin 1,200 Units/hr (10/06/19 1500)   PRN Meds: sodium chloride, [MAR Hold] acetaminophen, [MAR Hold] guaiFENesin-dextromethorphan, [MAR Hold] HYDROcodone-acetaminophen, [MAR Hold] levalbuterol, [MAR Hold] nitroGLYCERIN, [MAR Hold] ondansetron (ZOFRAN) IV, [MAR Hold] senna-docusate, sodium chloride flush, [MAR Hold] technetium albumin aggregated   Vital Signs    Vitals:   10/07/19 0914 10/07/19 0922 10/07/19 0931 10/07/19 0955  BP: 128/67   (!) 145/59  Pulse: 88 (!) 103 (!) 142 78  Resp: (!) 9 12 (!) 34 20  Temp:      TempSrc:      SpO2: 96% (!) 0% (!) 0% 99%  Weight:      Height:        Intake/Output Summary (Last 24 hours) at 10/07/2019 1002 Last data filed at 10/07/2019 0300 Gross per 24 hour  Intake 510.02 ml  Output 700 ml  Net -189.98 ml   Filed Weights   10/06/19 0022  Weight: 74.8 kg     Telemetry    Not available  ECG    no new tracing - Personally Reviewed  Physical Exam   GEN: No acute distress.   Cardiac: RRR. 3/6 cresendo-decresendo systolic murmur heard best at the right sternal border. 2/6 holosystolic murmur heard best at the apex. No gallops or rubs.  Respiratory: Clear to auscultation bilaterally. Neuro:  Nonfocal  Psych: Normal affect   Labs    Chemistry Recent Labs  Lab 10/05/19 2217 10/06/19 0448 10/07/19 0031  NA 142 142 140  K 4.1 3.8 4.0  CL 109 110 107  CO2 22 21* 26  GLUCOSE 134* 132* 115*  BUN 29* 28* 24*  CREATININE 1.52* 1.25* 1.05*  CALCIUM 8.8* 8.6* 8.5*  PROT 6.8  --   --   ALBUMIN 3.7  --   --   AST 19  --   --   ALT 15  --   --   ALKPHOS 61  --   --   BILITOT 0.5  --   --   GFRNONAA 29* 37* 46*  GFRAA 34* 43* 53*  ANIONGAP 11 11 7      Hematology Recent Labs  Lab 10/05/19 2217 10/07/19 0031  WBC 11.8* 9.8  RBC 3.05* 2.71*  HGB 10.7* 9.4*  HCT 32.4* 29.2*  MCV 106.2* 107.7*  MCH 35.1* 34.7*  MCHC 33.0 32.2  RDW 20.4* 20.6*  PLT 162 144*    Cardiac EnzymesNo results for input(s): TROPONINI in the last 168 hours. No results for input(s): TROPIPOC in the last 168 hours.   BNP Recent Labs  Lab 10/05/19 2254 10/07/19 0031  BNP 214.9* 344.0*     DDimer  Recent Labs  Lab 10/06/19 0448  DDIMER 1.47*     Radiology    CT Head Wo Contrast  Result Date: 10/06/2019 CLINICAL DATA:  Right-sided headache EXAM: CT HEAD WITHOUT CONTRAST TECHNIQUE: Contiguous axial images were obtained from the base of the skull through the vertex without intravenous contrast. COMPARISON:  01/20/2012 FINDINGS: Brain: No evidence of acute infarction, hemorrhage, hydrocephalus, extra-axial collection or mass lesion/mass effect. Brain atrophy especially notable at the sylvian fissures and occipital parietal sulci. Chronic small vessel ischemia throughout the deep cerebral white matter. Vascular: No hyperdense vessel or unexpected  calcification. Skull: Normal. Negative for fracture or focal lesion. Bilateral TMJ osteoarthritis Sinuses/Orbits: No acute finding. IMPRESSION: Senescent changes without acute or reversible finding. Electronically Signed   By: Monte Fantasia M.D.   On: 10/06/2019 04:06   NM Pulmonary Perfusion  Result Date: 10/06/2019 CLINICAL DATA:  Chest pain with elevated D-dimer EXAM: NUCLEAR MEDICINE PERFUSION LUNG SCAN VIEWS: Anterior, posterior, left lateral, right lateral, RPO, LPO, RAO, LAO RADIOPHARMACEUTICALS:  4.4 mCi Tc-71m MAA IV COMPARISON:  Chest radiograph October 06, 2019 FINDINGS: Radiotracer uptake is homogeneous and symmetric bilaterally. No appreciable perfusion defects. IMPRESSION: No appreciable perfusion defects.  No evidence of pulmonary embolus. Electronically Signed   By: Lowella Grip III M.D.   On: 10/06/2019 11:40   CARDIAC CATHETERIZATION  Result Date: 10/07/2019  Prox RCA lesion is 80% stenosed. Mid RCA lesion is 80% stenosed. Heavily calcified disease that would require atherectomy for PCI.  Mid LAD lesion is 50% stenosed.  There is moderate left ventricular systolic dysfunction.  The left ventricular ejection fraction is 25-35% by visual estimate.  LV end diastolic pressure is mildly elevated.  There is moderate aortic valve stenosis. Mean gradient 18 mm Hg. Valve area 1.5 cm2  Ao sat 89% on RA, PA sat 64% on RA; PA pressure 53/23, mean PA pressure 38 mm Hg; mean PCWP 26 mm Hg; CO 7.6 L.min; CI 4.1  Continue with management of LV dysfunction, which seems out of proportion to CAD. Will watch at [facility] for continued diuresis and medicine titration. If she has refractory angina, could consider high risk PCI of RCA with atherectomy.  Findings discussed with the son, Carinne Brandenburger.   DG Chest Port 1 View  Result Date: 10/06/2019 CLINICAL DATA:  Malaise, hypertension, aortic stenosis EXAM: PORTABLE CHEST 1 VIEW COMPARISON:  10/05/2019 FINDINGS: Bibasilar pulmonary infiltrates noted on prior  examination have significantly improved and trace residual interstitial infiltrate is noted within the lung bases bilaterally. The relatively rapid improvement suggests that this represents edema on prior examination. The lungs are well expanded and are symmetric. No pneumothorax or pleural effusion. Cardiac size within normal limits. No acute bone abnormality. IMPRESSION: Marked interval improvement in probable pulmonary edema noted on prior examination. Trace pulmonary edema persists. Electronically Signed   By: Fidela Salisbury MD   On: 10/06/2019 08:40   DG Chest Portable 1 View  Result Date: 10/05/2019 CLINICAL DATA:  Shortness of breath. EXAM: PORTABLE CHEST 1 VIEW COMPARISON:  October 20, 2016 FINDINGS: Stable mild diffuse chronic appearing increased lung markings are seen. Mild to moderate severity  areas of atelectasis and/or infiltrate are seen within the bilateral lung bases. Small bilateral pleural effusions are noted. No pneumothorax is identified. The cardiac silhouette is mildly enlarged. Multilevel degenerative changes seen throughout the thoracic spine. IMPRESSION: 1. Mild to moderate severity areas of atelectasis and/or infiltrate within the bilateral lung bases. 2. Small bilateral pleural effusions. Electronically Signed   By: Virgina Norfolk M.D.   On: 10/05/2019 22:44   ECHOCARDIOGRAM COMPLETE  Result Date: 10/06/2019    ECHOCARDIOGRAM REPORT   Patient Name:   DORETHIA JEANMARIE Date of Exam: 10/06/2019 Medical Rec #:  [id number]     Height:       66.0 in Accession #:    [id number]    Weight:       165.0 lb Date of Birth:  1924/07/03      BSA:          1.843 m Patient Age:    15 years      BP:           137/114 mmHg Patient Gender: F             HR:           99 bpm. Exam Location:  Inpatient Procedure: 2D Echo, 3D Echo, Color Doppler and Cardiac Doppler Indications:    K53.97 Acute diastolic (congestive) heart failure, Aortic                 Stenosis i35.0, Elevated Troponins  History:        Patient  has prior history of Echocardiogram examinations, most                 recent 07/21/2019. Risk Factors:Hypertension and Dyslipidemia.  Sonographer:    Raquel Sarna Senior RDCS Referring Phys: [phone] JARED M GARDNER  Sonographer Comments: Scanned sitting up due to dyspnea. IMPRESSIONS  1. Left ventricular ejection fraction, by estimation, is 30 to 35%. The left ventricle has moderately decreased function. The left ventricle demonstrates global hypokinesis. Indeterminate diastolic filling due to E-A fusion.  2. Right ventricular systolic function is normal. The right ventricular size is normal. Tricuspid regurgitation signal is inadequate for assessing PA pressure.  3. Left atrial size was mild to moderately dilated.  4. The mitral valve is degenerative. Mild to moderate mitral valve regurgitation. No evidence of mitral stenosis.  5. The aortic valve is tricuspid. Aortic valve regurgitation is not visualized. Severe aortic valve stenosis (possible low gradient severe AS). Aortic valve area, by VTI measures 0.84 cm. Aortic valve mean gradient measures 26.0 mmHg.  6. The inferior vena cava is dilated in size with >50% respiratory variability, suggesting right atrial pressure of 8 mmHg.  7. There is a pleural effusion. FINDINGS  Left Ventricle: Left ventricular ejection fraction, by estimation, is 30 to 35%. The left ventricle has moderately decreased function. The left ventricle demonstrates global hypokinesis. The left ventricular internal cavity size was normal in size. There is no left ventricular hypertrophy. Indeterminate diastolic filling due to E-A fusion. Right Ventricle: The right ventricular size is normal. No increase in right ventricular wall thickness. Right ventricular systolic function is normal. Tricuspid regurgitation signal is inadequate for assessing PA pressure. Left Atrium: Left atrial size was mild to moderately dilated. Right Atrium: Right atrial size was normal in size. Pericardium: There is a pleural  effusion. Trivial pericardial effusion is present. Mitral Valve: The mitral valve is degenerative in appearance. Moderate mitral annular calcification. Mild to moderate mitral valve regurgitation. No evidence of mitral valve  stenosis. Tricuspid Valve: The tricuspid valve is normal in structure. Tricuspid valve regurgitation is not demonstrated. Aortic Valve: The aortic valve is tricuspid. Aortic valve regurgitation is not visualized. Severe aortic stenosis is present. Aortic valve mean gradient measures 26.0 mmHg. Aortic valve peak gradient measures 41.0 mmHg. Aortic valve area, by VTI measures  0.84 cm. Pulmonic Valve: The pulmonic valve was normal in structure. Pulmonic valve regurgitation is trivial. Aorta: The aortic root is normal in size and structure. Venous: The inferior vena cava is dilated in size with greater than 50% respiratory variability, suggesting right atrial pressure of 8 mmHg. IAS/Shunts: No atrial level shunt detected by color flow Doppler.  LEFT VENTRICLE PLAX 2D LVIDd:         5.30 cm  Diastology LVIDs:         4.90 cm  LV e' lateral:   9.25 cm/s LV PW:         0.80 cm  LV E/e' lateral: 15.1 LV IVS:        1.10 cm  LV e' medial:    6.31 cm/s LVOT diam:     2.00 cm  LV E/e' medial:  22.2 LV SV:         62 LV SV Index:   34 LVOT Area:     3.14 cm  RIGHT VENTRICLE RV S prime:     13.20 cm/s TAPSE (M-mode): 2.0 cm LEFT ATRIUM              Index       RIGHT ATRIUM           Index LA diam:        4.70 cm  2.55 cm/m  RA Area:     15.60 cm LA Vol (A2C):   102.0 ml 55.35 ml/m RA Volume:   35.90 ml  19.48 ml/m LA Vol (A4C):   55.0 ml  29.85 ml/m LA Biplane Vol: 75.5 ml  40.97 ml/m  AORTIC VALVE AV Area (Vmax):    1.00 cm AV Area (Vmean):   0.95 cm AV Area (VTI):     0.84 cm AV Vmax:           320.00 cm/s AV Vmean:          239.000 cm/s AV VTI:            0.735 m AV Peak Grad:      41.0 mmHg AV Mean Grad:      26.0 mmHg LVOT Vmax:         102.00 cm/s LVOT Vmean:        72.500 cm/s LVOT VTI:           0.197 m LVOT/AV VTI ratio: 0.27  AORTA Ao Root diam: 2.90 cm Ao Asc diam:  3.40 cm MITRAL VALVE MV Area (PHT): 4.96 cm      SHUNTS MV Decel Time: 153 msec      Systemic VTI:  0.20 m MR Peak grad:    135.5 mmHg  Systemic Diam: 2.00 cm MR Mean grad:    88.0 mmHg MR Vmax:         582.00 cm/s MR Vmean:        447.0 cm/s MR PISA:         1.57 cm MR PISA Eff ROA: 10 mm MR PISA Radius:  0.50 cm MV E velocity: 140.00 cm/s Loralie Champagne MD Electronically signed by Loralie Champagne MD Signature Date/Time: 10/06/2019/2:58:21 PM    Final  Patient Profile     Ms. Cailen Texeira is a 86 y/o female with a PMHx of HTN, HLD, PAD, moderate aortic stenosis, hypothyroidism,. CKD Stage 3 who is currently admitted for chest pain evaluation.   Assessment & Plan    1. HFrEF: TTE on admission showed reduced EF of 30-35% with global hypokinesis of the left ventricle. Cath today did not show evidence of significant stenosis to explain new EF reduction. High on the differential is Takosubo sd. given cath results. Will need to resume diuresis given elevated PA wedge pressure and continued SOB.    - Recommend repeat outpatient TTE in 1-2 weeks to re-evaluate - Lasix 40 mg IV x 2  2. CAD: EKG on admission showed lateral ST depression with T wave inversion. Mild troponin elevation that flattened. Cath today showed evidence of Proximal RCA lesion with 80% stenosis. Intervention unable to be pursued given severe calcification. If patient has refractory angina, could consider PCI with atherectomy, although high risk. No angina noted today. Additionally, mid-LAD lesion notable for 50% stenosis.   - Discontinue Heparin  - Aspirin 81 mg daily - Nitroglycerin PRN for CP  3. Aortic Stenosis: Severe per most recent echo, however moderate on cath. Mean gradient pressure of 18 mm Hg with AVA of 1.5 cm^2. Asymptomatic at this time. No intervention necessary but recommend close outpatient follow up.   4. AoCKD3: Creatinine stable this  AM at 1.05. Given cath and restarting of diuresis, will recheck this afternoon.   - Repeat BMP in AM  5. HTN: Home medication includes Amlodipine-Benazepril. ACE held during admission given AKI. BP mildly elevated this AM but improving.   - Continue Amlodipine 5 mg.  - resume ACEi.   For questions or updates, please contact [place] Please consult [email or web address] for contact info under Cardiology/STEMI.      Signed, Jose Persia, MD  10/07/2019, 10:02 AM    I have seen and examined the patient along with Jose Persia, MD.  I have reviewed the chart, notes and new data.  I agree with her note.  Key new complaints: breathing is not completely back to normal, but is much better than on admission. No angina.  Key examination changes: R radial cath site w TR band, looks healthy. Clear lungs, mild JVD 8 cm. Early peaking Ao ej murmur with distinct S2. Less intense holosystolic murmur at apex. Key new findings / data: angio and R/L heart cath reviewed w Dr. Irish Lack. Remains volume overloaded. Only meaningful coronary stenosis is a heavily calcified mid RCA (this could explain her history of angina, but not the new cardiomyopathy), that would require rotablation for revascularization.  Fick Cardiac Output 7.68 L/min  Fick Cardiac Output Index 4.16 (L/min)/BSA  Aortic Mean Gradient 18.4 mmHg  Aortic Peak Gradient 20 mmHg  Aortic Valve Area 1.59  Aortic Value Area Index 0.86 cm2/BSA  RA Mean 9 mmHg  RV EDP 11 mmHg  PA Systolic Pressure 53 mmHg  PA Diastolic Pressure 23 mmHg  PA Mean 38 mmHg  PW A Wave 22 mmHg  PW V Wave 38 mmHg  PW Mean 25 mmHg  LV EDP 18 mmHg  AOp Systolic Pressure 400 mmHg  AOp Diastolic Pressure 55 mmHg  LVp Systolic Pressure 867 mmHg  LVp EDP Pressure 21 mmHg   Diagnostic Dominance: Right      PLAN: 1.  CHF: Possible takotsubo cardiomyopathy, albeit with atypical wall motion abnormality distribution. IV diuretics, resume ACEi. Monitor renal  parameters. Recheck echo in 2-3 weeks.  Would hope for full recovery. 2. CAD: RCA stenosis is a possible cause for angina, but not for LV dysfunction. This can be addressed in the future after recovery of LV function. It will require rotablation and will not be a low risk procedure. History of statin intolerance, most recent LDL 135. Recommend PCSK9 inh after DC. 3. Moderate AS: gradient likely underestimated by cath "pullback", but appears to be clearly in moderate range clinically and by echo. Normal cardiac output. Not a cause of the drop in LVEF.  Sanda Klein, MD, [place] [phone] 10/07/2019, 10:56 AM

## 2019-10-07 NOTE — Progress Notes (Signed)
Confirmed with Cath Lab that Heparin gtt will be stopped when [facility] arrives. Patient's son [name]) notified that this pt is on her way to [facility].

## 2019-10-07 NOTE — Interval H&P Note (Signed)
Cath Lab Visit (complete for each Cath Lab visit)  Clinical Evaluation Leading to the Procedure:   ACS: Yes.    Non-ACS:    Anginal Classification: CCS IV  Anti-ischemic medical therapy: Minimal Therapy (1 class of medications)  Non-Invasive Test Results: No non-invasive testing performed  Prior CABG: No previous CABG      History and Physical Interval Note:  10/07/2019 8:33 AM  [name]  has presented today for surgery, with the diagnosis of heart failure and unstable angina.  The various methods of treatment have been discussed with the patient and family. After consideration of risks, benefits and other options for treatment, the patient has consented to  Procedure(s): LEFT HEART CATH AND CORONARY ANGIOGRAPHY (N/A) as a surgical intervention.  The patient's history has been reviewed, patient examined, no change in status, stable for surgery.  I have reviewed the patient's chart and labs.  Questions were answered to the patient's satisfaction.     Larae Grooms

## 2019-10-07 NOTE — Progress Notes (Signed)
Pharmacy Brief Note - Anticoagulation Follow Up:  Pt on heparin drip for ACS.   Assessment:  HL = 0.53 is therapeutic on heparin infusion at 1200 units/hr  Confirmed with RN that heparin infusing at correct rate with no interruptions.    No signs of bleeding.    Goal: HL 0.3-0.7  Plan:  Continue heparin infusion at 1200 units/hr  Check confirmatory HL in 6 hours  CBC, HL daily while on heparin  Noted plans for possible cardiac cath at noon today- heparin off on call to procedure.   Netta Cedars, PharmD 10/07/2019@1 :23 AM

## 2019-10-07 NOTE — Progress Notes (Signed)
TR BAND REMOVAL  LOCATION:    Radial rt radial  DEFLATED PER PROTOCOL:   yes  TIME BAND OFF / DRESSING APPLIED:    1245/gauze and tegaderm  SITE UPON ARRIVAL:    Level 0  SITE AFTER BAND REMOVAL:    Level 0  CIRCULATION SENSATION AND MOVEMENT:    Within Normal Limits : rt radial level 0; palpable rt radial pulse, rt hand and fingers warm and pink, sensation present  COMMENTS:

## 2019-10-07 NOTE — Progress Notes (Signed)
PROGRESS NOTE    Brittney Gates  AJO:[id number] DOB: 1924-06-05 DOA: 10/05/2019 PCP: [name], MD   Chef Complaints: Chest pain shortness of breath  Brief Narrative: 86 year old female with history of hypertension, moderate aortic stenosis echo in April with normal EF, hypothyroidism, chronic anemia, chronic low back pain, PAD who came to the ED for evaluation of shortness of breath.  Symptom onset with mild shortness of breath 2 weeks ago but constant past 1 week son noted clammy with intermittent pallor and flushing before EMS arrived when she became acutely more short of breath, son gave 8 baby aspirin, symptoms improved with nitroglycerin.  She also complained of nonproductive cough, PND, and 1 week of intermittent sharp right-sided headache  In the ED, troponin 70, 75 BNP 214, D-dimer 1.4 ,chest x-ray with bibasilar infiltrates versus atelectasis trace pleural effusions, mild leukocytosis creatinine elevated 1.5 patient had recurrence of episode of shortness of breath and chest pressure in the ED BP was in 120s 30s and symptom improved with nitroglycerin.  Cardiology discussed, EKG showed ST depression anteriorly nursing inferior and lateral leads which appear to be present in previous EKG in 2019.  On-call cardiology was consulted and patient was placed on heparin drip and admitted. Patient is having coughing spells shortness of breath ongoing chest pain and underwent perfusion scan negative for PE given her positive D-dimer.  Repeat chest x-ray shows improving pulm edema. Seen by cardiology and for cardiac cath for ischemic evaluation given decreased EF of 30 to 35% on echo, which was normal in April  Subjective:  Seen this morning patient reports she had a good night slept well.  No chest pain this morning.  She is going for cardiac catheter today, no No family at the bedside.  Assessment & Plan:  Left chest pressure, shortness of breath secondary to acute systolic CHF,  suspected ACS, ruled out PE:EKG with ST-T wave changes but present on previous EKG, chest x-ray mild to moderate severity areas of atelectasis and/or infiltrate within the basal bilateral lung-that was improved on repeat chest x-ray.  Her symptoms were improved with nitroglycerin multiple times.  Troponin have been elevated in 70s to 80s but flat.  Seen by cardiology.  Echo shows EF 30 to 35% LV global hypokinesia suspecting new onset systolic CHF.  Per cardiology going for cardiac cath at [facility] and remains on heparin infusion.   AKI on CKD IIa baseline creatinine 1.1 in June/01/2020.  1.5 on admission and has improved to 1.0.  Monitor closely post-cath.  Recent Labs  Lab 10/05/19 2217 10/06/19 0448 10/07/19 0031  BUN 29* 28* 24*  CREATININE 1.52* 1.25* 9.47*   Mild metabolic acidosis: Resolved.   Essential hypertension: Blood pressure well controlled on amlodipine.Home lisinopril on hold due to AKI.   Moderate aortic stenosis by echo in April.  Cardiology on board.  Hypothyroidism continue home Synthroid.  Chronic low back pain takes Norco at home.  Neuropathy with left leg pain continue her home Lyrica.   DVT prophylaxis: Heparin infusion Code Status: DNR Family Communication: plan of care discussed with patient and her sons at bedside 7/8-no family at bedside today.  Status is: Once inpatient for ongoing management of suspected ACS/CHF with further work-up including cardiac cath cardiology follow-up and remains hospitalized.   Dispo: The patient is from: Home              Anticipated d/c is to: Home  Anticipated d/c date is: 2 days              Patient currently is not medically stable to d/c.  Nutrition: Diet Order            Diet NPO time specified Except for: Sips with Meds  Diet effective midnight                   Body mass index is 26.63 kg/m.  Consultants:CARDIOLLGY  Procedures:see note Microbiology:see note  Medications: Scheduled  Meds: . [MAR Hold] amLODipine  5 mg Oral Daily  . [MAR Hold] aspirin EC  81 mg Oral Daily  . [MAR Hold] chlorhexidine  15 mL Mouth Rinse BID  . [MAR Hold] Chlorhexidine Gluconate Cloth  6 each Topical Daily  . [MAR Hold] levothyroxine  100 mcg Oral Q0600  . [MAR Hold] mouth rinse  15 mL Mouth Rinse q12n4p  . [MAR Hold] multivitamin with minerals  1 tablet Oral Daily  . [MAR Hold] pregabalin  50 mg Oral Daily  . [MAR Hold] sodium chloride flush  3 mL Intravenous Q12H  . [MAR Hold] zolpidem  5 mg Oral QHS,MR X 1   Continuous Infusions: . sodium chloride    . sodium chloride 75 mL/hr at 10/07/19 0956  . sodium chloride 1 mL/kg/hr (10/07/19 0457)  . heparin 1,200 Units/hr (10/06/19 1500)    Antimicrobials: Anti-infectives (From admission, onward)   None       Objective: Vitals: Today's Vitals   10/07/19 0922 10/07/19 0931 10/07/19 0943 10/07/19 0955  BP:    (!) 145/59  Pulse: (!) 103 (!) 142  78  Resp: 12 (!) 34  20  Temp:      TempSrc:      SpO2: (!) 0% (!) 0%  99%  Weight:      Height:      PainSc:   0-No pain     Intake/Output Summary (Last 24 hours) at 10/07/2019 1036 Last data filed at 10/07/2019 0300 Gross per 24 hour  Intake 510.02 ml  Output 700 ml  Net -189.98 ml   Filed Weights   10/06/19 0022  Weight: 74.8 kg   Weight change:    Intake/Output from previous day: 07/08 0701 - 07/09 0700 In: 535 [P.O.:270; I.V.:265] Out: 700 [Urine:700] Intake/Output this shift: No intake/output data recorded.  Examination: General exam: AAO X3, elderly but not in distress, pleasant. HEENT:Oral mucosa moist, Ear/Nose WNL grossly, dentition normal. Respiratory system: bilaterally clear,no wheezing or crackles,no use of accessory muscle Cardiovascular system: S1 & S2 +, No JVD,. Gastrointestinal system: Abdomen soft, NT,ND, BS+ Nervous System:Alert, awake, moving extremities and grossly nonfocal Extremities: No edema, distal peripheral pulses palpable.  Skin: No  rashes,no icterus. MSK: Normal muscle bulk,tone, power    Data Reviewed: I have personally reviewed following labs and imaging studies CBC: Recent Labs  Lab 10/05/19 2217 10/07/19 0031  WBC 11.8* 9.8  HGB 10.7* 9.4*  HCT 32.4* 29.2*  MCV 106.2* 107.7*  PLT 162 384*   Basic Metabolic Panel: Recent Labs  Lab 10/05/19 2217 10/06/19 0448 10/07/19 0031  NA 142 142 140  K 4.1 3.8 4.0  CL 109 110 107  CO2 22 21* 26  GLUCOSE 134* 132* 115*  BUN 29* 28* 24*  CREATININE 1.52* 1.25* 1.05*  CALCIUM 8.8* 8.6* 8.5*   GFR: Estimated Creatinine Clearance: 34.6 mL/min (A) (by C-G formula based on SCr of 1.05 mg/dL (H)). Liver Function Tests: Recent Labs  Lab 10/05/19 2217  AST 19  ALT 15  ALKPHOS 61  BILITOT 0.5  PROT 6.8  ALBUMIN 3.7   Recent Labs  Lab 10/05/19 2229  LIPASE 23   No results for input(s): AMMONIA in the last 168 hours. Coagulation Profile: Recent Labs  Lab 10/06/19 0448  INR 1.0   Cardiac Enzymes: No results for input(s): CKTOTAL, CKMB, CKMBINDEX, TROPONINI in the last 168 hours. BNP (last 3 results) No results for input(s): PROBNP in the last 8760 hours. HbA1C: No results for input(s): HGBA1C in the last 72 hours. CBG: Recent Labs  Lab 10/05/19 2247  GLUCAP 99   Lipid Profile: No results for input(s): CHOL, HDL, LDLCALC, TRIG, CHOLHDL, LDLDIRECT in the last 72 hours. Thyroid Function Tests: No results for input(s): TSH, T4TOTAL, FREET4, T3FREE, THYROIDAB in the last 72 hours. Anemia Panel: No results for input(s): VITAMINB12, FOLATE, FERRITIN, TIBC, IRON, RETICCTPCT in the last 72 hours. Sepsis Labs: No results for input(s): PROCALCITON, LATICACIDVEN in the last 168 hours.  Recent Results (from the past 240 hour(s))  SARS Coronavirus 2 by RT PCR (hospital order, performed in [facility] hospital lab) Nasopharyngeal Nasopharyngeal Swab     Status: None   Collection Time: 10/06/19 12:27 AM   Specimen: Nasopharyngeal Swab  Result Value  Ref Range Status   SARS Coronavirus 2 NEGATIVE NEGATIVE Final    Comment: (NOTE) SARS-CoV-2 target nucleic acids are NOT DETECTED.  The SARS-CoV-2 RNA is generally detectable in upper and lower respiratory specimens during the acute phase of infection. The lowest concentration of SARS-CoV-2 viral copies this assay can detect is 250 copies / mL. A negative result does not preclude SARS-CoV-2 infection and should not be used as the sole basis for treatment or other patient management decisions.  A negative result may occur with improper specimen collection / handling, submission of specimen other than nasopharyngeal swab, presence of viral mutation(s) within the areas targeted by this assay, and inadequate number of viral copies (<250 copies / mL). A negative result must be combined with clinical observations, patient history, and epidemiological information.  Fact Sheet for Patients:   StrictlyIdeas.no  Fact Sheet for Healthcare Providers: BankingDealers.co.za  This test is not yet approved or  cleared by the [place] FDA and has been authorized for detection and/or diagnosis of SARS-CoV-2 by FDA under an Emergency Use Authorization (EUA).  This EUA will remain in effect (meaning this test can be used) for the duration of the COVID-19 declaration under Section 564(b)(1) of the Act, 21 U.S.C. section 360bbb-3(b)(1), unless the authorization is terminated or revoked sooner.  Performed at [facility], [place] [address] [place], [place] [address]   MRSA PCR Screening     Status: None   Collection Time: 10/06/19  5:43 AM   Specimen: Nasopharyngeal  Result Value Ref Range Status   MRSA by PCR NEGATIVE NEGATIVE Final    Comment:        The GeneXpert MRSA Assay (FDA approved for NASAL specimens only), is one component of a comprehensive MRSA colonization surveillance program. It is not intended to diagnose  MRSA infection nor to guide or monitor treatment for MRSA infections. Performed at [facility], [place] [address], [place], [place] [address]       Radiology Studies: CT Head Wo Contrast  Result Date: 10/06/2019 CLINICAL DATA:  Right-sided headache EXAM: CT HEAD WITHOUT CONTRAST TECHNIQUE: Contiguous axial images were obtained from the base of the skull through the vertex without intravenous contrast. COMPARISON:  01/20/2012 FINDINGS:  Brain: No evidence of acute infarction, hemorrhage, hydrocephalus, extra-axial collection or mass lesion/mass effect. Brain atrophy especially notable at the sylvian fissures and occipital parietal sulci. Chronic small vessel ischemia throughout the deep cerebral white matter. Vascular: No hyperdense vessel or unexpected calcification. Skull: Normal. Negative for fracture or focal lesion. Bilateral TMJ osteoarthritis Sinuses/Orbits: No acute finding. IMPRESSION: Senescent changes without acute or reversible finding. Electronically Signed   By: Monte Fantasia M.D.   On: 10/06/2019 04:06   NM Pulmonary Perfusion  Result Date: 10/06/2019 CLINICAL DATA:  Chest pain with elevated D-dimer EXAM: NUCLEAR MEDICINE PERFUSION LUNG SCAN VIEWS: Anterior, posterior, left lateral, right lateral, RPO, LPO, RAO, LAO RADIOPHARMACEUTICALS:  4.4 mCi Tc-78m MAA IV COMPARISON:  Chest radiograph October 06, 2019 FINDINGS: Radiotracer uptake is homogeneous and symmetric bilaterally. No appreciable perfusion defects. IMPRESSION: No appreciable perfusion defects.  No evidence of pulmonary embolus. Electronically Signed   By: Lowella Grip III M.D.   On: 10/06/2019 11:40   CARDIAC CATHETERIZATION  Result Date: 10/07/2019  Prox RCA lesion is 80% stenosed. Mid RCA lesion is 80% stenosed. Heavily calcified disease that would require atherectomy for PCI.  Mid LAD lesion is 50% stenosed.  There is moderate left ventricular systolic dysfunction.  The left ventricular ejection  fraction is 25-35% by visual estimate.  LV end diastolic pressure is mildly elevated.  There is moderate aortic valve stenosis. Mean gradient 18 mm Hg. Valve area 1.5 cm2  Ao sat 89% on RA, PA sat 64% on RA; PA pressure 53/23, mean PA pressure 38 mm Hg; mean PCWP 26 mm Hg; CO 7.6 L.min; CI 4.1  Continue with management of LV dysfunction, which seems out of proportion to CAD. Will watch at [facility] for continued diuresis and medicine titration. If she has refractory angina, could consider high risk PCI of RCA with atherectomy.  Findings discussed with the son, Jamice Carreno.   DG Chest Port 1 View  Result Date: 10/06/2019 CLINICAL DATA:  Malaise, hypertension, aortic stenosis EXAM: PORTABLE CHEST 1 VIEW COMPARISON:  10/05/2019 FINDINGS: Bibasilar pulmonary infiltrates noted on prior examination have significantly improved and trace residual interstitial infiltrate is noted within the lung bases bilaterally. The relatively rapid improvement suggests that this represents edema on prior examination. The lungs are well expanded and are symmetric. No pneumothorax or pleural effusion. Cardiac size within normal limits. No acute bone abnormality. IMPRESSION: Marked interval improvement in probable pulmonary edema noted on prior examination. Trace pulmonary edema persists. Electronically Signed   By: Fidela Salisbury MD   On: 10/06/2019 08:40   DG Chest Portable 1 View  Result Date: 10/05/2019 CLINICAL DATA:  Shortness of breath. EXAM: PORTABLE CHEST 1 VIEW COMPARISON:  October 20, 2016 FINDINGS: Stable mild diffuse chronic appearing increased lung markings are seen. Mild to moderate severity areas of atelectasis and/or infiltrate are seen within the bilateral lung bases. Small bilateral pleural effusions are noted. No pneumothorax is identified. The cardiac silhouette is mildly enlarged. Multilevel degenerative changes seen throughout the thoracic spine. IMPRESSION: 1. Mild to moderate severity areas of atelectasis and/or  infiltrate within the bilateral lung bases. 2. Small bilateral pleural effusions. Electronically Signed   By: Virgina Norfolk M.D.   On: 10/05/2019 22:44   ECHOCARDIOGRAM COMPLETE  Result Date: 10/06/2019    ECHOCARDIOGRAM REPORT   Patient Name:   CHARVI GAMMAGE Date of Exam: 10/06/2019 Medical Rec #:  [id number]     Height:       66.0 in Accession #:    [id number]  Weight:       165.0 lb Date of Birth:  28-Sep-1924      BSA:          1.843 m Patient Age:    67 years      BP:           137/114 mmHg Patient Gender: F             HR:           99 bpm. Exam Location:  Inpatient Procedure: 2D Echo, 3D Echo, Color Doppler and Cardiac Doppler Indications:    E52.77 Acute diastolic (congestive) heart failure, Aortic                 Stenosis i35.0, Elevated Troponins  History:        Patient has prior history of Echocardiogram examinations, most                 recent 07/21/2019. Risk Factors:Hypertension and Dyslipidemia.  Sonographer:    Raquel Sarna Senior RDCS Referring Phys: [phone] JARED M GARDNER  Sonographer Comments: Scanned sitting up due to dyspnea. IMPRESSIONS  1. Left ventricular ejection fraction, by estimation, is 30 to 35%. The left ventricle has moderately decreased function. The left ventricle demonstrates global hypokinesis. Indeterminate diastolic filling due to E-A fusion.  2. Right ventricular systolic function is normal. The right ventricular size is normal. Tricuspid regurgitation signal is inadequate for assessing PA pressure.  3. Left atrial size was mild to moderately dilated.  4. The mitral valve is degenerative. Mild to moderate mitral valve regurgitation. No evidence of mitral stenosis.  5. The aortic valve is tricuspid. Aortic valve regurgitation is not visualized. Severe aortic valve stenosis (possible low gradient severe AS). Aortic valve area, by VTI measures 0.84 cm. Aortic valve mean gradient measures 26.0 mmHg.  6. The inferior vena cava is dilated in size with >50% respiratory variability,  suggesting right atrial pressure of 8 mmHg.  7. There is a pleural effusion. FINDINGS  Left Ventricle: Left ventricular ejection fraction, by estimation, is 30 to 35%. The left ventricle has moderately decreased function. The left ventricle demonstrates global hypokinesis. The left ventricular internal cavity size was normal in size. There is no left ventricular hypertrophy. Indeterminate diastolic filling due to E-A fusion. Right Ventricle: The right ventricular size is normal. No increase in right ventricular wall thickness. Right ventricular systolic function is normal. Tricuspid regurgitation signal is inadequate for assessing PA pressure. Left Atrium: Left atrial size was mild to moderately dilated. Right Atrium: Right atrial size was normal in size. Pericardium: There is a pleural effusion. Trivial pericardial effusion is present. Mitral Valve: The mitral valve is degenerative in appearance. Moderate mitral annular calcification. Mild to moderate mitral valve regurgitation. No evidence of mitral valve stenosis. Tricuspid Valve: The tricuspid valve is normal in structure. Tricuspid valve regurgitation is not demonstrated. Aortic Valve: The aortic valve is tricuspid. Aortic valve regurgitation is not visualized. Severe aortic stenosis is present. Aortic valve mean gradient measures 26.0 mmHg. Aortic valve peak gradient measures 41.0 mmHg. Aortic valve area, by VTI measures  0.84 cm. Pulmonic Valve: The pulmonic valve was normal in structure. Pulmonic valve regurgitation is trivial. Aorta: The aortic root is normal in size and structure. Venous: The inferior vena cava is dilated in size with greater than 50% respiratory variability, suggesting right atrial pressure of 8 mmHg. IAS/Shunts: No atrial level shunt detected by color flow Doppler.  LEFT VENTRICLE PLAX 2D LVIDd:  5.30 cm  Diastology LVIDs:         4.90 cm  LV e' lateral:   9.25 cm/s LV PW:         0.80 cm  LV E/e' lateral: 15.1 LV IVS:         1.10 cm  LV e' medial:    6.31 cm/s LVOT diam:     2.00 cm  LV E/e' medial:  22.2 LV SV:         62 LV SV Index:   34 LVOT Area:     3.14 cm  RIGHT VENTRICLE RV S prime:     13.20 cm/s TAPSE (M-mode): 2.0 cm LEFT ATRIUM              Index       RIGHT ATRIUM           Index LA diam:        4.70 cm  2.55 cm/m  RA Area:     15.60 cm LA Vol (A2C):   102.0 ml 55.35 ml/m RA Volume:   35.90 ml  19.48 ml/m LA Vol (A4C):   55.0 ml  29.85 ml/m LA Biplane Vol: 75.5 ml  40.97 ml/m  AORTIC VALVE AV Area (Vmax):    1.00 cm AV Area (Vmean):   0.95 cm AV Area (VTI):     0.84 cm AV Vmax:           320.00 cm/s AV Vmean:          239.000 cm/s AV VTI:            0.735 m AV Peak Grad:      41.0 mmHg AV Mean Grad:      26.0 mmHg LVOT Vmax:         102.00 cm/s LVOT Vmean:        72.500 cm/s LVOT VTI:          0.197 m LVOT/AV VTI ratio: 0.27  AORTA Ao Root diam: 2.90 cm Ao Asc diam:  3.40 cm MITRAL VALVE MV Area (PHT): 4.96 cm      SHUNTS MV Decel Time: 153 msec      Systemic VTI:  0.20 m MR Peak grad:    135.5 mmHg  Systemic Diam: 2.00 cm MR Mean grad:    88.0 mmHg MR Vmax:         582.00 cm/s MR Vmean:        447.0 cm/s MR PISA:         1.57 cm MR PISA Eff ROA: 10 mm MR PISA Radius:  0.50 cm MV E velocity: 140.00 cm/s Loralie Champagne MD Electronically signed by Loralie Champagne MD Signature Date/Time: 10/06/2019/2:58:21 PM    Final      LOS: 1 day   Antonieta Pert, MD [facility]  10/07/2019, 10:36 AM

## 2019-10-08 DIAGNOSIS — I5021 Acute systolic (congestive) heart failure: Secondary | ICD-10-CM

## 2019-10-08 DIAGNOSIS — I2510 Atherosclerotic heart disease of native coronary artery without angina pectoris: Secondary | ICD-10-CM

## 2019-10-08 DIAGNOSIS — N179 Acute kidney failure, unspecified: Secondary | ICD-10-CM

## 2019-10-08 DIAGNOSIS — N183 Chronic kidney disease, stage 3 unspecified: Secondary | ICD-10-CM

## 2019-10-08 LAB — BASIC METABOLIC PANEL
Anion gap: 8 (ref 5–15)
BUN: 20 mg/dL (ref 8–23)
CO2: 26 mmol/L (ref 22–32)
Calcium: 8.8 mg/dL — ABNORMAL LOW (ref 8.9–10.3)
Chloride: 107 mmol/L (ref 98–111)
Creatinine, Ser: 1.23 mg/dL — ABNORMAL HIGH (ref 0.44–1.00)
GFR calc Af Amer: 44 mL/min — ABNORMAL LOW (ref >60)
GFR calc non Af Amer: 38 mL/min — ABNORMAL LOW (ref >60)
Glucose, Bld: 111 mg/dL — ABNORMAL HIGH (ref 70–99)
Potassium: 3.6 mmol/L (ref 3.5–5.1)
Sodium: 141 mmol/L (ref 135–145)

## 2019-10-08 LAB — CBC
HCT: 29.5 % — ABNORMAL LOW (ref 36.0–46.0)
Hemoglobin: 9.7 g/dL — ABNORMAL LOW (ref 12.0–15.0)
MCH: 35.1 pg — ABNORMAL HIGH (ref 26.0–34.0)
MCHC: 32.9 g/dL (ref 30.0–36.0)
MCV: 106.9 fL — ABNORMAL HIGH (ref 80.0–100.0)
Platelets: 143 10*3/uL — ABNORMAL LOW (ref 150–400)
RBC: 2.76 MIL/uL — ABNORMAL LOW (ref 3.87–5.11)
RDW: 20.7 % — ABNORMAL HIGH (ref 11.5–15.5)
WBC: 7.9 10*3/uL (ref 4.0–10.5)
nRBC: 0.3 % — ABNORMAL HIGH (ref 0.0–0.2)

## 2019-10-08 LAB — VITAMIN B12: Vitamin B-12: 331 pg/mL (ref 180–914)

## 2019-10-08 LAB — FOLATE: Folate: 62 ng/mL (ref >5.9)

## 2019-10-08 MED ORDER — FUROSEMIDE 40 MG PO TABS
40.0000 mg | ORAL_TABLET | Freq: Two times a day (BID) | ORAL | Status: DC
  Administered 2019-10-08 – 2019-10-09 (×4): 40 mg via ORAL
  Filled 2019-10-08 (×4): qty 1

## 2019-10-08 NOTE — Progress Notes (Signed)
Progress Note  Patient Name: Brittney Gates Date of Encounter: 10/08/2019  Primary Cardiologist: Sherren Mocha, MD   Subjective   No chest pain  Slept flat Dyspnea improved    Inpatient Medications    Scheduled Meds: . amLODipine  5 mg Oral Daily  . aspirin EC  81 mg Oral Daily  . benazepril  20 mg Oral Daily  . chlorhexidine  15 mL Mouth Rinse BID  . Chlorhexidine Gluconate Cloth  6 each Topical Daily  . furosemide  40 mg Intravenous Q12H  . levothyroxine  100 mcg Oral Q0600  . mouth rinse  15 mL Mouth Rinse q12n4p  . multivitamin with minerals  1 tablet Oral Daily  . pregabalin  50 mg Oral Daily  . sodium chloride flush  3 mL Intravenous Q12H  . zolpidem  5 mg Oral QHS,MR X 1   Continuous Infusions: . sodium chloride     PRN Meds: sodium chloride, acetaminophen, guaiFENesin-dextromethorphan, HYDROcodone-acetaminophen, levalbuterol, nitroGLYCERIN, ondansetron (ZOFRAN) IV, senna-docusate, sodium chloride flush, technetium albumin aggregated   Vital Signs    Vitals:   10/07/19 1250 10/07/19 1319 10/07/19 2135 10/08/19 0418  BP: (!) 105/42 121/83 124/61 129/75  Pulse: 90 96 80 90  Resp: (!) 25 (!) 21 17 20   Temp:  97.9 F (36.6 C) 98 F (36.7 C) 98.3 F (36.8 C)  TempSrc:  Oral Oral Oral  SpO2: 95% 95% 97% 98%  Weight:    78.8 kg  Height:        Intake/Output Summary (Last 24 hours) at 10/08/2019 0745 Last data filed at 10/08/2019 0419 Gross per 24 hour  Intake 363 ml  Output 1600 ml  Net -1237 ml   Filed Weights   10/06/19 0022 10/08/19 0418  Weight: 74.8 kg 78.8 kg    Telemetry    NSR 10/08/2019   ECG    NSR lateral ST changes   Physical Exam   Affect appropriate Elderly white female high pitched voice  HEENT: normal Neck supple with no adenopathy JVP normal no bruits no thyromegaly Lungs clear with no wheezing and good diaphragmatic motion Heart:  S1/S2 AS/MR  murmur, no rub, gallop or click PMI normal Abdomen: benighn, BS positve, no  tenderness, no AAA no bruit.  No HSM or HJR Distal pulses intact with no bruits No edema Neuro non-focal Skin warm and dry No muscular weakness   Labs    Chemistry Recent Labs  Lab 10/05/19 2217 10/05/19 2217 10/06/19 0448 10/06/19 0448 10/07/19 0031 10/07/19 4656 10/07/19 0912 10/07/19 0913 10/08/19 0338  NA 142   < > 142   < > 140   < > 143 143 141  K 4.1   < > 3.8   < > 4.0   < > 3.7 3.8 3.6  CL 109   < > 110  --  107  --   --   --  107  CO2 22   < > 21*  --  26  --   --   --  26  GLUCOSE 134*   < > 132*  --  115*  --   --   --  111*  BUN 29*   < > 28*  --  24*  --   --   --  20  CREATININE 1.52*   < > 1.25*  --  1.05*  --   --   --  1.23*  CALCIUM 8.8*   < > 8.6*  --  8.5*  --   --   --  8.8*  PROT 6.8  --   --   --   --   --   --   --   --   ALBUMIN 3.7  --   --   --   --   --   --   --   --   AST 19  --   --   --   --   --   --   --   --   ALT 15  --   --   --   --   --   --   --   --   ALKPHOS 61  --   --   --   --   --   --   --   --   BILITOT 0.5  --   --   --   --   --   --   --   --   GFRNONAA 29*   < > 37*  --  46*  --   --   --  38*  GFRAA 34*   < > 43*  --  53*  --   --   --  44*  ANIONGAP 11   < > 11  --  7  --   --   --  8   < > = values in this interval not displayed.     Hematology Recent Labs  Lab 10/05/19 2217 10/05/19 2217 10/07/19 0031 10/07/19 0904 10/07/19 0912 10/07/19 0913 10/08/19 0338  WBC 11.8*  --  9.8  --   --   --  7.9  RBC 3.05*  --  2.71*  --   --   --  2.76*  HGB 10.7*   < > 9.4*   < > 10.5* 10.5* 9.7*  HCT 32.4*   < > 29.2*   < > 31.0* 31.0* 29.5*  MCV 106.2*  --  107.7*  --   --   --  106.9*  MCH 35.1*  --  34.7*  --   --   --  35.1*  MCHC 33.0  --  32.2  --   --   --  32.9  RDW 20.4*  --  20.6*  --   --   --  20.7*  PLT 162  --  144*  --   --   --  143*   < > = values in this interval not displayed.    Cardiac EnzymesNo results for input(s): TROPONINI in the last 168 hours. No results for input(s): TROPIPOC in the  last 168 hours.   BNP Recent Labs  Lab 10/05/19 2254 10/07/19 0031  BNP 214.9* 344.0*     DDimer  Recent Labs  Lab 10/06/19 0448  DDIMER 1.47*     Radiology    NM Pulmonary Perfusion  Result Date: 10/06/2019 CLINICAL DATA:  Chest pain with elevated D-dimer EXAM: NUCLEAR MEDICINE PERFUSION LUNG SCAN VIEWS: Anterior, posterior, left lateral, right lateral, RPO, LPO, RAO, LAO RADIOPHARMACEUTICALS:  4.4 mCi Tc-59m MAA IV COMPARISON:  Chest radiograph October 06, 2019 FINDINGS: Radiotracer uptake is homogeneous and symmetric bilaterally. No appreciable perfusion defects. IMPRESSION: No appreciable perfusion defects.  No evidence of pulmonary embolus. Electronically Signed   By: Lowella Grip III M.D.   On: 10/06/2019 11:40   CARDIAC CATHETERIZATION  Result Date: 10/07/2019  Prox RCA lesion is 80% stenosed. Mid RCA lesion is 80% stenosed. Heavily calcified disease that would require atherectomy for PCI.  Mid LAD lesion is 50% stenosed.  There is moderate left ventricular systolic dysfunction.  The left ventricular ejection fraction is 25-35% by visual estimate.  LV end diastolic pressure is mildly elevated.  There is moderate aortic valve stenosis. Mean gradient 18 mm Hg. Valve area 1.5 cm2  Ao sat 89% on RA, PA sat 64% on RA; PA pressure 53/23, mean PA pressure 38 mm Hg; mean PCWP 26 mm Hg; CO 7.6 L.min; CI 4.1  Continue with management of LV dysfunction, which seems out of proportion to CAD. Will watch at [facility] for continued diuresis and medicine titration. If she has refractory angina, could consider high risk PCI of RCA with atherectomy.  Findings discussed with the son, Daney Moor.   DG Chest Port 1 View  Result Date: 10/06/2019 CLINICAL DATA:  Malaise, hypertension, aortic stenosis EXAM: PORTABLE CHEST 1 VIEW COMPARISON:  10/05/2019 FINDINGS: Bibasilar pulmonary infiltrates noted on prior examination have significantly improved and trace residual interstitial infiltrate is noted  within the lung bases bilaterally. The relatively rapid improvement suggests that this represents edema on prior examination. The lungs are well expanded and are symmetric. No pneumothorax or pleural effusion. Cardiac size within normal limits. No acute bone abnormality. IMPRESSION: Marked interval improvement in probable pulmonary edema noted on prior examination. Trace pulmonary edema persists. Electronically Signed   By: Fidela Salisbury MD   On: 10/06/2019 08:40   ECHOCARDIOGRAM COMPLETE  Result Date: 10/06/2019    ECHOCARDIOGRAM REPORT   Patient Name:   SHARRON PETRUSKA Date of Exam: 10/06/2019 Medical Rec #:  [id number]     Height:       66.0 in Accession #:    [id number]    Weight:       165.0 lb Date of Birth:  1924-11-17      BSA:          1.843 m Patient Age:    19 years      BP:           137/114 mmHg Patient Gender: F             HR:           99 bpm. Exam Location:  Inpatient Procedure: 2D Echo, 3D Echo, Color Doppler and Cardiac Doppler Indications:    E52.77 Acute diastolic (congestive) heart failure, Aortic                 Stenosis i35.0, Elevated Troponins  History:        Patient has prior history of Echocardiogram examinations, most                 recent 07/21/2019. Risk Factors:Hypertension and Dyslipidemia.  Sonographer:    Raquel Sarna Senior RDCS Referring Phys: [phone] JARED M GARDNER  Sonographer Comments: Scanned sitting up due to dyspnea. IMPRESSIONS  1. Left ventricular ejection fraction, by estimation, is 30 to 35%. The left ventricle has moderately decreased function. The left ventricle demonstrates global hypokinesis. Indeterminate diastolic filling due to E-A fusion.  2. Right ventricular systolic function is normal. The right ventricular size is normal. Tricuspid regurgitation signal is inadequate for assessing PA pressure.  3. Left atrial size was mild to moderately dilated.  4. The mitral valve is degenerative. Mild to moderate mitral valve regurgitation. No evidence of mitral stenosis.  5. The  aortic valve is tricuspid. Aortic valve regurgitation is not visualized. Severe aortic valve stenosis (possible low gradient severe AS). Aortic valve area, by VTI measures 0.84 cm. Aortic valve  mean gradient measures 26.0 mmHg.  6. The inferior vena cava is dilated in size with >50% respiratory variability, suggesting right atrial pressure of 8 mmHg.  7. There is a pleural effusion. FINDINGS  Left Ventricle: Left ventricular ejection fraction, by estimation, is 30 to 35%. The left ventricle has moderately decreased function. The left ventricle demonstrates global hypokinesis. The left ventricular internal cavity size was normal in size. There is no left ventricular hypertrophy. Indeterminate diastolic filling due to E-A fusion. Right Ventricle: The right ventricular size is normal. No increase in right ventricular wall thickness. Right ventricular systolic function is normal. Tricuspid regurgitation signal is inadequate for assessing PA pressure. Left Atrium: Left atrial size was mild to moderately dilated. Right Atrium: Right atrial size was normal in size. Pericardium: There is a pleural effusion. Trivial pericardial effusion is present. Mitral Valve: The mitral valve is degenerative in appearance. Moderate mitral annular calcification. Mild to moderate mitral valve regurgitation. No evidence of mitral valve stenosis. Tricuspid Valve: The tricuspid valve is normal in structure. Tricuspid valve regurgitation is not demonstrated. Aortic Valve: The aortic valve is tricuspid. Aortic valve regurgitation is not visualized. Severe aortic stenosis is present. Aortic valve mean gradient measures 26.0 mmHg. Aortic valve peak gradient measures 41.0 mmHg. Aortic valve area, by VTI measures  0.84 cm. Pulmonic Valve: The pulmonic valve was normal in structure. Pulmonic valve regurgitation is trivial. Aorta: The aortic root is normal in size and structure. Venous: The inferior vena cava is dilated in size with greater than 50%  respiratory variability, suggesting right atrial pressure of 8 mmHg. IAS/Shunts: No atrial level shunt detected by color flow Doppler.  LEFT VENTRICLE PLAX 2D LVIDd:         5.30 cm  Diastology LVIDs:         4.90 cm  LV e' lateral:   9.25 cm/s LV PW:         0.80 cm  LV E/e' lateral: 15.1 LV IVS:        1.10 cm  LV e' medial:    6.31 cm/s LVOT diam:     2.00 cm  LV E/e' medial:  22.2 LV SV:         62 LV SV Index:   34 LVOT Area:     3.14 cm  RIGHT VENTRICLE RV S prime:     13.20 cm/s TAPSE (M-mode): 2.0 cm LEFT ATRIUM              Index       RIGHT ATRIUM           Index LA diam:        4.70 cm  2.55 cm/m  RA Area:     15.60 cm LA Vol (A2C):   102.0 ml 55.35 ml/m RA Volume:   35.90 ml  19.48 ml/m LA Vol (A4C):   55.0 ml  29.85 ml/m LA Biplane Vol: 75.5 ml  40.97 ml/m  AORTIC VALVE AV Area (Vmax):    1.00 cm AV Area (Vmean):   0.95 cm AV Area (VTI):     0.84 cm AV Vmax:           320.00 cm/s AV Vmean:          239.000 cm/s AV VTI:            0.735 m AV Peak Grad:      41.0 mmHg AV Mean Grad:      26.0 mmHg LVOT Vmax:  102.00 cm/s LVOT Vmean:        72.500 cm/s LVOT VTI:          0.197 m LVOT/AV VTI ratio: 0.27  AORTA Ao Root diam: 2.90 cm Ao Asc diam:  3.40 cm MITRAL VALVE MV Area (PHT): 4.96 cm      SHUNTS MV Decel Time: 153 msec      Systemic VTI:  0.20 m MR Peak grad:    135.5 mmHg  Systemic Diam: 2.00 cm MR Mean grad:    88.0 mmHg MR Vmax:         582.00 cm/s MR Vmean:        447.0 cm/s MR PISA:         1.57 cm MR PISA Eff ROA: 10 mm MR PISA Radius:  0.50 cm MV E velocity: 140.00 cm/s Loralie Champagne MD Electronically signed by Loralie Champagne MD Signature Date/Time: 10/06/2019/2:58:21 PM    Final    Patient Profile     Ms. Jeroline Wolbert is a 86 y/o female with a PMHx of HTN, HLD, PAD, moderate aortic stenosis, hypothyroidism,. CKD Stage 3 who is currently admitted for chest pain evaluation.   Assessment & Plan    1. CHF:   TTE on admission showed reduced EF of 30-35% with global hypokinesis  of the left ventricle. EF down disproportionate  to CAD 1200 cc out PCWP at cath 26 mmHg continue medical Rx lungs clear this am Change to oral lasix Valve disease does not help   2. CAD: Medical Rx for calcified 80% proximal RCA If refractory angina can consider high risk atherectomy ASA  3. Aortic Stenosis: moderate by cath given age even TAVR not likely a good choice   4. AoCKD3: Cr stable 1.23 this am post contrast load   5. HTN: continue amlodipine and resume ACE   For questions or updates, please contact [place] Please consult [email or web address] for contact info under Cardiology/STEMI.      Signed, Jenkins Rouge, MD  10/08/2019, 7:45 AM

## 2019-10-08 NOTE — Progress Notes (Signed)
PROGRESS NOTE    Brittney Gates  MVH:[id number] DOB: 1925/01/07 DOA: 10/05/2019 PCP: [name], MD   Brief Narrative:   86 year old female with history of hypertension, moderate aortic stenosis echo in April with normal EF, hypothyroidism, chronic anemia, chronic low back pain, PAD who came to the ED for evaluation of shortness of breath.  Symptom onset with mild shortness of breath 2 weeks ago but constant past 1 week son noted clammy with intermittent pallor and flushing before EMS arrived when she became acutely more short of breath, son gave 8 baby aspirin, symptoms improved with nitroglycerin.  She also complained of nonproductive cough, PND, and 1 week of intermittent sharp right-sided headache  7/10: Continuing diuresis. Remains on 2L [place]. Continue weaning as tolerated.   Assessment & Plan:   Principal Problem:   Left chest pressure Active Problems:   Hypothyroidism   Hyperlipidemia   Essential hypertension   ANEMIA   Osteoporosis   Aortic stenosis   Unstable angina (HCC)   AKI (acute kidney injury) (HCC)   CKD (chronic kidney disease) stage 3, GFR 30-59 ml/min   CAD (coronary artery disease)   Acute HFrEF (heart failure with reduced ejection fraction) (HCC)  Acute HFrEF     - cards onboard, appreciate assistance     - TTE on admission showed reduced EF of 30-35% with global hypokinesis of the left ventricle.      - LHC did not show evidence of significant stenosis to explain new EF reduction.     - lasix ordered      - Recommend repeat outpatient TTE in 1-2 weeks to re-evaluate     - 7/10: transition to oral lasix; wean O2 as able  CAD      - EKG on admission showed lateral ST depression with T wave inversion.      - Mild troponin elevation that flattened.      - LHC showed evidence of Proximal RCA lesion with 80% stenosis. Intervention unable to be pursued given severe calcification.      - If patient has refractory angina, could consider PCI with  atherectomy, although high risk. No angina noted today. Additionally, mid-LAD lesion notable for 50% stenosis.     - appreciate cards assistance      - Aspirin 81 mg daily     - Nitroglycerin PRN for CP  Aortic Stenosis     - Severe per most recent echo, however moderate on cath.      - Mean gradient pressure of 18 mm Hg with AVA of 1.5 cm^2.      - Asymptomatic at this time. No intervention necessary but recommend close outpatient follow up.   AKI on CKD3a     - SCr is 1.23; monitor  HTN     - Home medication includes Amlodipine-Benazepril.      - ACE initially held d/t AKI     - continue benazepril, amlodipine  Macrocytic anemia     - no evidence of bleed     - check B12, THF  Hypothyroidism     - continue synthroid  Insomina     - continue ambien  Neuropathy     - continue lyrica  DVT prophylaxis: SCDs Code Status: DNR Family Communication: None at bedside   Status is: Inpatient  Remains inpatient appropriate because:Inpatient level of care appropriate due to severity of illness   Dispo: The patient is from: Home  Anticipated d/c is to: TBD              Anticipated d/c date is: 1 day              Patient currently is not medically stable to d/c.  Consultants:   Cardiology  Procedures:   LHC  Antimicrobials:  . None   ROS:  Denies CP, N, V. Reports some dyspnea . Remainder 10-pt ROS is negative for all not previously mentioned.  Subjective: "They did a lot."  Objective: Vitals:   10/07/19 1250 10/07/19 1319 10/07/19 2135 10/08/19 0418  BP: (!) 105/42 121/83 124/61 129/75  Pulse: 90 96 80 90  Resp: (!) 25 (!) 21 17 20   Temp:  97.9 F (36.6 C) 98 F (36.7 C) 98.3 F (36.8 C)  TempSrc:  Oral Oral Oral  SpO2: 95% 95% 97% 98%  Weight:    78.8 kg  Height:        Intake/Output Summary (Last 24 hours) at 10/08/2019 0731 Last data filed at 10/08/2019 0419 Gross per 24 hour  Intake 363 ml  Output 1600 ml  Net -1237 ml   Filed  Weights   10/06/19 0022 10/08/19 0418  Weight: 74.8 kg 78.8 kg    Examination:  General: 86 y.o. female resting in bed in NAD Cardiovascular: RRR, +S1, S2, no g/r, 1/6 SEM Respiratory: decreased at bases, no w/r/r, normal WOB on 2L [place] GI: BS+, NDNT, no masses noted, no organomegaly noted MSK: No e/c/c Neuro: Alert to name, follows commands Psyc: Appropriate interaction and affect, calm/cooperative  Data Reviewed: I have personally reviewed following labs and imaging studies.  CBC: Recent Labs  Lab 10/05/19 2217 10/05/19 2217 10/07/19 0031 10/07/19 0904 10/07/19 0912 10/07/19 0913 10/08/19 0338  WBC 11.8*  --  9.8  --   --   --  7.9  HGB 10.7*   < > 9.4* 10.2* 10.5* 10.5* 9.7*  HCT 32.4*   < > 29.2* 30.0* 31.0* 31.0* 29.5*  MCV 106.2*  --  107.7*  --   --   --  106.9*  PLT 162  --  144*  --   --   --  143*   < > = values in this interval not displayed.   Basic Metabolic Panel: Recent Labs  Lab 10/05/19 2217 10/05/19 2217 10/06/19 0448 10/06/19 0448 10/07/19 0031 10/07/19 0904 10/07/19 0912 10/07/19 0913 10/08/19 0338  NA 142   < > 142   < > 140 142 143 143 141  K 4.1   < > 3.8   < > 4.0 3.7 3.7 3.8 3.6  CL 109  --  110  --  107  --   --   --  107  CO2 22  --  21*  --  26  --   --   --  26  GLUCOSE 134*  --  132*  --  115*  --   --   --  111*  BUN 29*  --  28*  --  24*  --   --   --  20  CREATININE 1.52*  --  1.25*  --  1.05*  --   --   --  1.23*  CALCIUM 8.8*  --  8.6*  --  8.5*  --   --   --  8.8*   < > = values in this interval not displayed.   GFR: Estimated Creatinine Clearance: 30.3 mL/min (A) (by C-G formula based on SCr  of 1.23 mg/dL (H)). Liver Function Tests: Recent Labs  Lab 10/05/19 2217  AST 19  ALT 15  ALKPHOS 61  BILITOT 0.5  PROT 6.8  ALBUMIN 3.7   Recent Labs  Lab 10/05/19 2229  LIPASE 23   No results for input(s): AMMONIA in the last 168 hours. Coagulation Profile: Recent Labs  Lab 10/06/19 0448  INR 1.0   Cardiac  Enzymes: No results for input(s): CKTOTAL, CKMB, CKMBINDEX, TROPONINI in the last 168 hours. BNP (last 3 results) No results for input(s): PROBNP in the last 8760 hours. HbA1C: No results for input(s): HGBA1C in the last 72 hours. CBG: Recent Labs  Lab 10/05/19 2247  GLUCAP 99   Lipid Profile: No results for input(s): CHOL, HDL, LDLCALC, TRIG, CHOLHDL, LDLDIRECT in the last 72 hours. Thyroid Function Tests: No results for input(s): TSH, T4TOTAL, FREET4, T3FREE, THYROIDAB in the last 72 hours. Anemia Panel: No results for input(s): VITAMINB12, FOLATE, FERRITIN, TIBC, IRON, RETICCTPCT in the last 72 hours. Sepsis Labs: No results for input(s): PROCALCITON, LATICACIDVEN in the last 168 hours.  Recent Results (from the past 240 hour(s))  SARS Coronavirus 2 by RT PCR (hospital order, performed in [facility] hospital lab) Nasopharyngeal Nasopharyngeal Swab     Status: None   Collection Time: 10/06/19 12:27 AM   Specimen: Nasopharyngeal Swab  Result Value Ref Range Status   SARS Coronavirus 2 NEGATIVE NEGATIVE Final    Comment: (NOTE) SARS-CoV-2 target nucleic acids are NOT DETECTED.  The SARS-CoV-2 RNA is generally detectable in upper and lower respiratory specimens during the acute phase of infection. The lowest concentration of SARS-CoV-2 viral copies this assay can detect is 250 copies / mL. A negative result does not preclude SARS-CoV-2 infection and should not be used as the sole basis for treatment or other patient management decisions.  A negative result may occur with improper specimen collection / handling, submission of specimen other than nasopharyngeal swab, presence of viral mutation(s) within the areas targeted by this assay, and inadequate number of viral copies (<250 copies / mL). A negative result must be combined with clinical observations, patient history, and epidemiological information.  Fact Sheet for Patients:    StrictlyIdeas.no  Fact Sheet for Healthcare Providers: BankingDealers.co.za  This test is not yet approved or  cleared by the [place] FDA and has been authorized for detection and/or diagnosis of SARS-CoV-2 by FDA under an Emergency Use Authorization (EUA).  This EUA will remain in effect (meaning this test can be used) for the duration of the COVID-19 declaration under Section 564(b)(1) of the Act, 21 U.S.C. section 360bbb-3(b)(1), unless the authorization is terminated or revoked sooner.  Performed at [facility], [place] [address] [place], [place] [address]   MRSA PCR Screening     Status: None   Collection Time: 10/06/19  5:43 AM   Specimen: Nasopharyngeal  Result Value Ref Range Status   MRSA by PCR NEGATIVE NEGATIVE Final    Comment:        The GeneXpert MRSA Assay (FDA approved for NASAL specimens only), is one component of a comprehensive MRSA colonization surveillance program. It is not intended to diagnose MRSA infection nor to guide or monitor treatment for MRSA infections. Performed at [facility], [place] [address], [place],  [address]       Radiology Studies: NM Pulmonary Perfusion  Result Date: 10/06/2019 CLINICAL DATA:  Chest pain with elevated D-dimer EXAM: NUCLEAR MEDICINE PERFUSION LUNG SCAN VIEWS: Anterior, posterior, left lateral,  right lateral, RPO, LPO, RAO, LAO RADIOPHARMACEUTICALS:  4.4 mCi Tc-63m MAA IV COMPARISON:  Chest radiograph October 06, 2019 FINDINGS: Radiotracer uptake is homogeneous and symmetric bilaterally. No appreciable perfusion defects. IMPRESSION: No appreciable perfusion defects.  No evidence of pulmonary embolus. Electronically Signed   By: Lowella Grip III M.D.   On: 10/06/2019 11:40   CARDIAC CATHETERIZATION  Result Date: 10/07/2019  Prox RCA lesion is 80% stenosed. Mid RCA lesion is 80% stenosed. Heavily calcified disease that would  require atherectomy for PCI.  Mid LAD lesion is 50% stenosed.  There is moderate left ventricular systolic dysfunction.  The left ventricular ejection fraction is 25-35% by visual estimate.  LV end diastolic pressure is mildly elevated.  There is moderate aortic valve stenosis. Mean gradient 18 mm Hg. Valve area 1.5 cm2  Ao sat 89% on RA, PA sat 64% on RA; PA pressure 53/23, mean PA pressure 38 mm Hg; mean PCWP 26 mm Hg; CO 7.6 L.min; CI 4.1  Continue with management of LV dysfunction, which seems out of proportion to CAD. Will watch at [facility] for continued diuresis and medicine titration. If she has refractory angina, could consider high risk PCI of RCA with atherectomy.  Findings discussed with the son, Brittney Gates.   DG Chest Port 1 View  Result Date: 10/06/2019 CLINICAL DATA:  Malaise, hypertension, aortic stenosis EXAM: PORTABLE CHEST 1 VIEW COMPARISON:  10/05/2019 FINDINGS: Bibasilar pulmonary infiltrates noted on prior examination have significantly improved and trace residual interstitial infiltrate is noted within the lung bases bilaterally. The relatively rapid improvement suggests that this represents edema on prior examination. The lungs are well expanded and are symmetric. No pneumothorax or pleural effusion. Cardiac size within normal limits. No acute bone abnormality. IMPRESSION: Marked interval improvement in probable pulmonary edema noted on prior examination. Trace pulmonary edema persists. Electronically Signed   By: Fidela Salisbury MD   On: 10/06/2019 08:40   ECHOCARDIOGRAM COMPLETE  Result Date: 10/06/2019    ECHOCARDIOGRAM REPORT   Patient Name:   LATREECE MOCHIZUKI Date of Exam: 10/06/2019 Medical Rec #:  [id number]     Height:       66.0 in Accession #:    [id number]    Weight:       165.0 lb Date of Birth:  11-10-1924      BSA:          1.843 m Patient Age:    61 years      BP:           137/114 mmHg Patient Gender: F             HR:           99 bpm. Exam Location:  Inpatient Procedure: 2D  Echo, 3D Echo, Color Doppler and Cardiac Doppler Indications:    U38.45 Acute diastolic (congestive) heart failure, Aortic                 Stenosis i35.0, Elevated Troponins  History:        Patient has prior history of Echocardiogram examinations, most                 recent 07/21/2019. Risk Factors:Hypertension and Dyslipidemia.  Sonographer:    Raquel Sarna Senior RDCS Referring Phys: [phone] JARED M GARDNER  Sonographer Comments: Scanned sitting up due to dyspnea. IMPRESSIONS  1. Left ventricular ejection fraction, by estimation, is 30 to 35%. The left ventricle has moderately decreased function. The left ventricle demonstrates global hypokinesis. Indeterminate  diastolic filling due to E-A fusion.  2. Right ventricular systolic function is normal. The right ventricular size is normal. Tricuspid regurgitation signal is inadequate for assessing PA pressure.  3. Left atrial size was mild to moderately dilated.  4. The mitral valve is degenerative. Mild to moderate mitral valve regurgitation. No evidence of mitral stenosis.  5. The aortic valve is tricuspid. Aortic valve regurgitation is not visualized. Severe aortic valve stenosis (possible low gradient severe AS). Aortic valve area, by VTI measures 0.84 cm. Aortic valve mean gradient measures 26.0 mmHg.  6. The inferior vena cava is dilated in size with >50% respiratory variability, suggesting right atrial pressure of 8 mmHg.  7. There is a pleural effusion. FINDINGS  Left Ventricle: Left ventricular ejection fraction, by estimation, is 30 to 35%. The left ventricle has moderately decreased function. The left ventricle demonstrates global hypokinesis. The left ventricular internal cavity size was normal in size. There is no left ventricular hypertrophy. Indeterminate diastolic filling due to E-A fusion. Right Ventricle: The right ventricular size is normal. No increase in right ventricular wall thickness. Right ventricular systolic function is normal. Tricuspid  regurgitation signal is inadequate for assessing PA pressure. Left Atrium: Left atrial size was mild to moderately dilated. Right Atrium: Right atrial size was normal in size. Pericardium: There is a pleural effusion. Trivial pericardial effusion is present. Mitral Valve: The mitral valve is degenerative in appearance. Moderate mitral annular calcification. Mild to moderate mitral valve regurgitation. No evidence of mitral valve stenosis. Tricuspid Valve: The tricuspid valve is normal in structure. Tricuspid valve regurgitation is not demonstrated. Aortic Valve: The aortic valve is tricuspid. Aortic valve regurgitation is not visualized. Severe aortic stenosis is present. Aortic valve mean gradient measures 26.0 mmHg. Aortic valve peak gradient measures 41.0 mmHg. Aortic valve area, by VTI measures  0.84 cm. Pulmonic Valve: The pulmonic valve was normal in structure. Pulmonic valve regurgitation is trivial. Aorta: The aortic root is normal in size and structure. Venous: The inferior vena cava is dilated in size with greater than 50% respiratory variability, suggesting right atrial pressure of 8 mmHg. IAS/Shunts: No atrial level shunt detected by color flow Doppler.  LEFT VENTRICLE PLAX 2D LVIDd:         5.30 cm  Diastology LVIDs:         4.90 cm  LV e' lateral:   9.25 cm/s LV PW:         0.80 cm  LV E/e' lateral: 15.1 LV IVS:        1.10 cm  LV e' medial:    6.31 cm/s LVOT diam:     2.00 cm  LV E/e' medial:  22.2 LV SV:         62 LV SV Index:   34 LVOT Area:     3.14 cm  RIGHT VENTRICLE RV S prime:     13.20 cm/s TAPSE (M-mode): 2.0 cm LEFT ATRIUM              Index       RIGHT ATRIUM           Index LA diam:        4.70 cm  2.55 cm/m  RA Area:     15.60 cm LA Vol (A2C):   102.0 ml 55.35 ml/m RA Volume:   35.90 ml  19.48 ml/m LA Vol (A4C):   55.0 ml  29.85 ml/m LA Biplane Vol: 75.5 ml  40.97 ml/m  AORTIC VALVE AV Area (Vmax):  1.00 cm AV Area (Vmean):   0.95 cm AV Area (VTI):     0.84 cm AV Vmax:            320.00 cm/s AV Vmean:          239.000 cm/s AV VTI:            0.735 m AV Peak Grad:      41.0 mmHg AV Mean Grad:      26.0 mmHg LVOT Vmax:         102.00 cm/s LVOT Vmean:        72.500 cm/s LVOT VTI:          0.197 m LVOT/AV VTI ratio: 0.27  AORTA Ao Root diam: 2.90 cm Ao Asc diam:  3.40 cm MITRAL VALVE MV Area (PHT): 4.96 cm      SHUNTS MV Decel Time: 153 msec      Systemic VTI:  0.20 m MR Peak grad:    135.5 mmHg  Systemic Diam: 2.00 cm MR Mean grad:    88.0 mmHg MR Vmax:         582.00 cm/s MR Vmean:        447.0 cm/s MR PISA:         1.57 cm MR PISA Eff ROA: 10 mm MR PISA Radius:  0.50 cm MV E velocity: 140.00 cm/s Loralie Champagne MD Electronically signed by Loralie Champagne MD Signature Date/Time: 10/06/2019/2:58:21 PM    Final      Scheduled Meds: . amLODipine  5 mg Oral Daily  . aspirin EC  81 mg Oral Daily  . benazepril  20 mg Oral Daily  . chlorhexidine  15 mL Mouth Rinse BID  . Chlorhexidine Gluconate Cloth  6 each Topical Daily  . furosemide  40 mg Intravenous Q12H  . levothyroxine  100 mcg Oral Q0600  . mouth rinse  15 mL Mouth Rinse q12n4p  . multivitamin with minerals  1 tablet Oral Daily  . pregabalin  50 mg Oral Daily  . sodium chloride flush  3 mL Intravenous Q12H  . zolpidem  5 mg Oral QHS,MR X 1   Continuous Infusions: . sodium chloride       LOS: 2 days    Time spent: 25 minutes spent in the coordination of care today.    Jonnie Finner, DO [facility]  If 7PM-7AM, please contact night-coverage [email or web address] 10/08/2019, 7:31 AM

## 2019-10-09 DIAGNOSIS — I25768 Atherosclerosis of bypass graft of coronary artery of transplanted heart with other forms of angina pectoris: Secondary | ICD-10-CM

## 2019-10-09 LAB — CBC
HCT: 31.5 % — ABNORMAL LOW (ref 36.0–46.0)
Hemoglobin: 10.4 g/dL — ABNORMAL LOW (ref 12.0–15.0)
MCH: 35 pg — ABNORMAL HIGH (ref 26.0–34.0)
MCHC: 33 g/dL (ref 30.0–36.0)
MCV: 106.1 fL — ABNORMAL HIGH (ref 80.0–100.0)
Platelets: 123 10*3/uL — ABNORMAL LOW (ref 150–400)
RBC: 2.97 MIL/uL — ABNORMAL LOW (ref 3.87–5.11)
RDW: 20.4 % — ABNORMAL HIGH (ref 11.5–15.5)
WBC: 7 10*3/uL (ref 4.0–10.5)
nRBC: 0.4 % — ABNORMAL HIGH (ref 0.0–0.2)

## 2019-10-09 LAB — TSH: TSH: 4.157 u[IU]/mL (ref 0.350–4.500)

## 2019-10-09 LAB — BASIC METABOLIC PANEL
Anion gap: 11 (ref 5–15)
BUN: 28 mg/dL — ABNORMAL HIGH (ref 8–23)
CO2: 28 mmol/L (ref 22–32)
Calcium: 9 mg/dL (ref 8.9–10.3)
Chloride: 102 mmol/L (ref 98–111)
Creatinine, Ser: 1.43 mg/dL — ABNORMAL HIGH (ref 0.44–1.00)
GFR calc Af Amer: 36 mL/min — ABNORMAL LOW (ref >60)
GFR calc non Af Amer: 31 mL/min — ABNORMAL LOW (ref >60)
Glucose, Bld: 109 mg/dL — ABNORMAL HIGH (ref 70–99)
Potassium: 3.5 mmol/L (ref 3.5–5.1)
Sodium: 141 mmol/L (ref 135–145)

## 2019-10-09 NOTE — Progress Notes (Signed)
PROGRESS NOTE    Brittney Gates  AYT:[id number] DOB: Mar 20, 1925 DOA: 10/05/2019 PCP: [name], MD   Brief Narrative:   86 year old female with history of hypertension, moderate aortic stenosis echo in April with normal EF, hypothyroidism, chronic anemia, chronic low back pain, PAD who came to the ED for evaluation of shortness of breath. Symptom onset with mild shortness of breath 2 weeks ago but constant past 1 week son noted clammy with intermittent pallor and flushing before EMS arrived when she became acutely more short of breath, son gave 8 baby aspirin, symptoms improved with nitroglycerin. She also complained of nonproductive cough, PND, and 1 week of intermittent sharp right-sided headache  7/11: Scr is increased. Changed to PO lasix. Follow Scr. Hopefully home in next day or two.   Assessment & Plan:   Principal Problem:   Left chest pressure Active Problems:   Hypothyroidism   Hyperlipidemia   Essential hypertension   ANEMIA   Osteoporosis   Aortic stenosis   Unstable angina (HCC)   AKI (acute kidney injury) (HCC)   CKD (chronic kidney disease) stage 3, GFR 30-59 ml/min   CAD (coronary artery disease)   Acute HFrEF (heart failure with reduced ejection fraction) (HCC)  Acute HFrEF     - cards onboard, appreciate assistance     - TTE on admission showed reduced EF of 30-35% with global hypokinesis of the left ventricle.      - LHC did not show evidence of significant stenosis to explain new EF reduction.     - Recommend repeat outpatient TTE in 1-2 weeks to re-evaluate     - transitioned to oral lasix     - 7/11: Continuing to wean O2. ON RA and holding sats ok. Scr is up. Follow. May need to decrease. Hopefully home in next day or two. Appreciate cardiology assistance  CAD      - EKG on admission showed lateral ST depression with T wave inversion.      - Mild troponin elevation that flattened.      - LHC showed evidence of Proximal RCA lesion with 80%  stenosis. Intervention unable to be pursued given severe calcification.      - If patient has refractory angina, could consider PCI with atherectomy, although high risk. No angina noted today. Additionally, mid-LAD lesion notable for 50% stenosis.     - appreciate cards assistance      - Aspirin 81 mg daily     - Nitroglycerin PRN for CP  Aortic Stenosis     - Severe per most recent echo, however moderate on cath.      - Mean gradient pressure of 18 mm Hg with AVA of 1.5 cm^2.      - Asymptomatic at this time. No intervention necessary but recommend close outpatient follow up.   AKI on CKD3a     - SCr baseline is 1 - 1.1.      - SCr is 1.43; monitor; may need to cut back on lasic  HTN     - Home medication includes Amlodipine-Benazepril.      - ACE initially held d/t AKI     - continue benazepril, amlodipine  Macrocytic anemia     - no evidence of bleed     - B12, THF normal; check TSH  Hypothyroidism     - continue synthroid  Insomina     - continue ambien  Neuropathy     - continue lyrica  DVT prophylaxis:  SCDs Code Status: DNR Family Communication: With family at bedside   Status is: Inpatient  Remains inpatient appropriate because:worsening kidney function.   Dispo: The patient is from: Home              Anticipated d/c is to: TBD, PT eval pending.              Anticipated d/c date is: 1 day              Patient currently is not medically stable to d/c.  Consultants:   Cardiology  Procedures:   LHC  ROS:  Denies CP, N, V, ab pain . Remainder 10-pt ROS is negative for all not previously mentioned.  Subjective: "So my kidneys are bad?"  Objective: Vitals:   10/08/19 2200 10/09/19 0300 10/09/19 0602 10/09/19 0808  BP:   118/68 93/82  Pulse:   60 69  Resp: 18 18 18 18   Temp:   97.9 F (36.6 C) 97.9 F (36.6 C)  TempSrc:   Oral Oral  SpO2: 93% 97% 98% 100%  Weight:   76.7 kg   Height:        Intake/Output Summary (Last 24 hours) at  10/09/2019 0811 Last data filed at 10/09/2019 0600 Gross per 24 hour  Intake --  Output 950 ml  Net -950 ml   Filed Weights   10/06/19 0022 10/08/19 0418 10/09/19 0602  Weight: 74.8 kg 78.8 kg 76.7 kg    Examination:  General: 86 y.o. female resting in bed in NAD Cardiovascular: RRR, +S1, S2, no g/r, 1/6 SEM Respiratory: improved air movement, no w/r/r, normal WOB GI: BS+, NDNT, no masses noted, no organomegaly noted MSK: No e/c/c Neuro: Alert to name, follows commands Psyc: Appropriate interaction and affect, calm/cooperative   Data Reviewed: I have personally reviewed following labs and imaging studies.  CBC: Recent Labs  Lab 10/05/19 2217 10/05/19 2217 10/07/19 0031 10/07/19 0031 10/07/19 0904 10/07/19 0912 10/07/19 0913 10/08/19 0338 10/09/19 0330  WBC 11.8*  --  9.8  --   --   --   --  7.9 7.0  HGB 10.7*   < > 9.4*   < > 10.2* 10.5* 10.5* 9.7* 10.4*  HCT 32.4*   < > 29.2*   < > 30.0* 31.0* 31.0* 29.5* 31.5*  MCV 106.2*  --  107.7*  --   --   --   --  106.9* 106.1*  PLT 162  --  144*  --   --   --   --  143* 123*   < > = values in this interval not displayed.   Basic Metabolic Panel: Recent Labs  Lab 10/05/19 2217 10/05/19 2217 10/06/19 0448 10/06/19 0448 10/07/19 0031 10/07/19 0031 10/07/19 1610 10/07/19 0912 10/07/19 0913 10/08/19 0338 10/09/19 0330  NA 142   < > 142   < > 140   < > 142 143 143 141 141  K 4.1   < > 3.8   < > 4.0   < > 3.7 3.7 3.8 3.6 3.5  CL 109  --  110  --  107  --   --   --   --  107 102  CO2 22  --  21*  --  26  --   --   --   --  26 28  GLUCOSE 134*  --  132*  --  115*  --   --   --   --  111* 109*  BUN  29*  --  28*  --  24*  --   --   --   --  20 28*  CREATININE 1.52*  --  1.25*  --  1.05*  --   --   --   --  1.23* 1.43*  CALCIUM 8.8*  --  8.6*  --  8.5*  --   --   --   --  8.8* 9.0   < > = values in this interval not displayed.   GFR: Estimated Creatinine Clearance: 25.7 mL/min (A) (by C-G formula based on SCr of 1.43  mg/dL (H)). Liver Function Tests: Recent Labs  Lab 10/05/19 2217  AST 19  ALT 15  ALKPHOS 61  BILITOT 0.5  PROT 6.8  ALBUMIN 3.7   Recent Labs  Lab 10/05/19 2229  LIPASE 23   No results for input(s): AMMONIA in the last 168 hours. Coagulation Profile: Recent Labs  Lab 10/06/19 0448  INR 1.0   Cardiac Enzymes: No results for input(s): CKTOTAL, CKMB, CKMBINDEX, TROPONINI in the last 168 hours. BNP (last 3 results) No results for input(s): PROBNP in the last 8760 hours. HbA1C: No results for input(s): HGBA1C in the last 72 hours. CBG: Recent Labs  Lab 10/05/19 2247  GLUCAP 99   Lipid Profile: No results for input(s): CHOL, HDL, LDLCALC, TRIG, CHOLHDL, LDLDIRECT in the last 72 hours. Thyroid Function Tests: No results for input(s): TSH, T4TOTAL, FREET4, T3FREE, THYROIDAB in the last 72 hours. Anemia Panel: Recent Labs    10/08/19 0952  VITAMINB12 331  FOLATE 62.0   Sepsis Labs: No results for input(s): PROCALCITON, LATICACIDVEN in the last 168 hours.  Recent Results (from the past 240 hour(s))  SARS Coronavirus 2 by RT PCR (hospital order, performed in [facility] hospital lab) Nasopharyngeal Nasopharyngeal Swab     Status: None   Collection Time: 10/06/19 12:27 AM   Specimen: Nasopharyngeal Swab  Result Value Ref Range Status   SARS Coronavirus 2 NEGATIVE NEGATIVE Final    Comment: (NOTE) SARS-CoV-2 target nucleic acids are NOT DETECTED.  The SARS-CoV-2 RNA is generally detectable in upper and lower respiratory specimens during the acute phase of infection. The lowest concentration of SARS-CoV-2 viral copies this assay can detect is 250 copies / mL. A negative result does not preclude SARS-CoV-2 infection and should not be used as the sole basis for treatment or other patient management decisions.  A negative result may occur with improper specimen collection / handling, submission of specimen other than nasopharyngeal swab, presence of viral mutation(s)  within the areas targeted by this assay, and inadequate number of viral copies (<250 copies / mL). A negative result must be combined with clinical observations, patient history, and epidemiological information.  Fact Sheet for Patients:   StrictlyIdeas.no  Fact Sheet for Healthcare Providers: BankingDealers.co.za  This test is not yet approved or  cleared by the [place] FDA and has been authorized for detection and/or diagnosis of SARS-CoV-2 by FDA under an Emergency Use Authorization (EUA).  This EUA will remain in effect (meaning this test can be used) for the duration of the COVID-19 declaration under Section 564(b)(1) of the Act, 21 U.S.C. section 360bbb-3(b)(1), unless the authorization is terminated or revoked sooner.  Performed at [facility], [place] [address] [place], [place] [address]   MRSA PCR Screening     Status: None   Collection Time: 10/06/19  5:43 AM   Specimen: Nasopharyngeal  Result Value Ref Range Status   MRSA by  PCR NEGATIVE NEGATIVE Final    Comment:        The GeneXpert MRSA Assay (FDA approved for NASAL specimens only), is one component of a comprehensive MRSA colonization surveillance program. It is not intended to diagnose MRSA infection nor to guide or monitor treatment for MRSA infections. Performed at [facility], [place] [address], [place], [place] [address]       Radiology Studies: CARDIAC CATHETERIZATION  Result Date: 10/07/2019  Prox RCA lesion is 80% stenosed. Mid RCA lesion is 80% stenosed. Heavily calcified disease that would require atherectomy for PCI.  Mid LAD lesion is 50% stenosed.  There is moderate left ventricular systolic dysfunction.  The left ventricular ejection fraction is 25-35% by visual estimate.  LV end diastolic pressure is mildly elevated.  There is moderate aortic valve stenosis. Mean gradient 18 mm Hg. Valve area 1.5 cm2   Ao sat 89% on RA, PA sat 64% on RA; PA pressure 53/23, mean PA pressure 38 mm Hg; mean PCWP 26 mm Hg; CO 7.6 L.min; CI 4.1  Continue with management of LV dysfunction, which seems out of proportion to CAD. Will watch at [facility] for continued diuresis and medicine titration. If she has refractory angina, could consider high risk PCI of RCA with atherectomy.  Findings discussed with the son, Kilea Mccarey.     Scheduled Meds: . amLODipine  5 mg Oral Daily  . aspirin EC  81 mg Oral Daily  . benazepril  20 mg Oral Daily  . chlorhexidine  15 mL Mouth Rinse BID  . Chlorhexidine Gluconate Cloth  6 each Topical Daily  . furosemide  40 mg Oral BID  . levothyroxine  100 mcg Oral Q0600  . mouth rinse  15 mL Mouth Rinse q12n4p  . multivitamin with minerals  1 tablet Oral Daily  . pregabalin  50 mg Oral Daily  . sodium chloride flush  3 mL Intravenous Q12H  . zolpidem  5 mg Oral QHS,MR X 1   Continuous Infusions: . sodium chloride       LOS: 3 days    Time spent: 25 minutes spent in the coordination of care today.    Jonnie Finner, DO [facility]  If 7PM-7AM, please contact night-coverage [email or web address] 10/09/2019, 8:11 AM

## 2019-10-09 NOTE — Progress Notes (Signed)
Progress Note  Patient Name: Brittney Gates Date of Encounter: 10/09/2019  Primary Cardiologist: Brittney Mocha, MD   Subjective   No chest pain  Slept flat Dyspnea improved    Inpatient Medications    Scheduled Meds: . amLODipine  5 mg Oral Daily  . aspirin EC  81 mg Oral Daily  . benazepril  20 mg Oral Daily  . chlorhexidine  15 mL Mouth Rinse BID  . Chlorhexidine Gluconate Cloth  6 each Topical Daily  . furosemide  40 mg Oral BID  . levothyroxine  100 mcg Oral Q0600  . mouth rinse  15 mL Mouth Rinse q12n4p  . multivitamin with minerals  1 tablet Oral Daily  . pregabalin  50 mg Oral Daily  . sodium chloride flush  3 mL Intravenous Q12H  . zolpidem  5 mg Oral QHS,MR X 1   Continuous Infusions: . sodium chloride     PRN Meds: sodium chloride, acetaminophen, guaiFENesin-dextromethorphan, HYDROcodone-acetaminophen, levalbuterol, nitroGLYCERIN, ondansetron (ZOFRAN) IV, senna-docusate, sodium chloride flush, technetium albumin aggregated   Vital Signs    Vitals:   10/08/19 2200 10/09/19 0300 10/09/19 0602 10/09/19 0808  BP:   118/68 93/82  Pulse:   60 69  Resp: 18 18 18 18   Temp:   97.9 F (36.6 C) 97.9 F (36.6 C)  TempSrc:   Oral Oral  SpO2: 93% 97% 98% 100%  Weight:   76.7 kg   Height:        Intake/Output Summary (Last 24 hours) at 10/09/2019 0819 Last data filed at 10/09/2019 0600 Gross per 24 hour  Intake --  Output 950 ml  Net -950 ml   Filed Weights   10/06/19 0022 10/08/19 0418 10/09/19 0602  Weight: 74.8 kg 78.8 kg 76.7 kg    Telemetry    NSR 10/09/2019   ECG    NSR lateral ST changes   Physical Exam   Affect appropriate Elderly white female high pitched voice  HEENT: normal Neck supple with no adenopathy JVP normal no bruits no thyromegaly Lungs clear with no wheezing and good diaphragmatic motion Heart:  S1/S2 AS/MR  murmur, no rub, gallop or click PMI normal Abdomen: benighn, BS positve, no tenderness, no AAA no bruit.  No HSM  or HJR Distal pulses intact with no bruits No edema Neuro non-focal Skin warm and dry No muscular weakness   Labs    Chemistry Recent Labs  Lab 10/05/19 2217 10/06/19 0448 10/07/19 0031 10/07/19 0904 10/07/19 0913 10/08/19 0338 10/09/19 0330  NA 142   < > 140   < > 143 141 141  K 4.1   < > 4.0   < > 3.8 3.6 3.5  CL 109   < > 107  --   --  107 102  CO2 22   < > 26  --   --  26 28  GLUCOSE 134*   < > 115*  --   --  111* 109*  BUN 29*   < > 24*  --   --  20 28*  CREATININE 1.52*   < > 1.05*  --   --  1.23* 1.43*  CALCIUM 8.8*   < > 8.5*  --   --  8.8* 9.0  PROT 6.8  --   --   --   --   --   --   ALBUMIN 3.7  --   --   --   --   --   --  AST 19  --   --   --   --   --   --   ALT 15  --   --   --   --   --   --   ALKPHOS 61  --   --   --   --   --   --   BILITOT 0.5  --   --   --   --   --   --   GFRNONAA 29*   < > 46*  --   --  38* 31*  GFRAA 34*   < > 53*  --   --  44* 36*  ANIONGAP 11   < > 7  --   --  8 11   < > = values in this interval not displayed.     Hematology Recent Labs  Lab 10/07/19 0031 10/07/19 0904 10/07/19 0913 10/08/19 0338 10/09/19 0330  WBC 9.8  --   --  7.9 7.0  RBC 2.71*  --   --  2.76* 2.97*  HGB 9.4*   < > 10.5* 9.7* 10.4*  HCT 29.2*   < > 31.0* 29.5* 31.5*  MCV 107.7*  --   --  106.9* 106.1*  MCH 34.7*  --   --  35.1* 35.0*  MCHC 32.2  --   --  32.9 33.0  RDW 20.6*  --   --  20.7* 20.4*  PLT 144*  --   --  143* 123*   < > = values in this interval not displayed.    Cardiac EnzymesNo results for input(s): TROPONINI in the last 168 hours. No results for input(s): TROPIPOC in the last 168 hours.   BNP Recent Labs  Lab 10/05/19 2254 10/07/19 0031  BNP 214.9* 344.0*     DDimer  Recent Labs  Lab 10/06/19 0448  DDIMER 1.47*     Radiology    CARDIAC CATHETERIZATION  Result Date: 10/07/2019  Prox RCA lesion is 80% stenosed. Mid RCA lesion is 80% stenosed. Heavily calcified disease that would require atherectomy for PCI.   Mid LAD lesion is 50% stenosed.  There is moderate left ventricular systolic dysfunction.  The left ventricular ejection fraction is 25-35% by visual estimate.  LV end diastolic pressure is mildly elevated.  There is moderate aortic valve stenosis. Mean gradient 18 mm Hg. Valve area 1.5 cm2  Ao sat 89% on RA, PA sat 64% on RA; PA pressure 53/23, mean PA pressure 38 mm Hg; mean PCWP 26 mm Hg; CO 7.6 L.min; CI 4.1  Continue with management of LV dysfunction, which seems out of proportion to CAD. Will watch at [facility] for continued diuresis and medicine titration. If she has refractory angina, could consider high risk PCI of RCA with atherectomy.  Findings discussed with the son, Brittney Gates.   Patient Profile     Ms. Brittney Gates is a 86 y/o female with a PMHx of HTN, HLD, PAD, moderate aortic stenosis, hypothyroidism,. CKD Stage 3 who is currently admitted for chest pain evaluation.   Assessment & Plan    1. CHF:   TTE on admission showed reduced EF of 30-35% with global hypokinesis of the left ventricle. EF down disproportionate  to CAD  I/O's negative 950 cc  PCWP at cath 26 mmHg continue medical Rx lungs clear this am Change to oral lasix  Valve disease does not help   2. CAD: Medical Rx for calcified 80% proximal RCA If refractory angina can  consider high risk atherectomy ASA  3. Aortic Stenosis: moderate by cath given age even TAVR not likely a good choice   4. AoCKD3: Cr rising a bit 1.43 this am follow closely with age and post contrast If higher in am will need to hold lasix   5. HTN: BP low d/c norvasc she is on lotensin Pulse in 60's without beta blocker    For questions or updates, please contact [place] Please consult [email or web address] for contact info under Cardiology/STEMI.      Signed, Brittney Rouge, MD  10/09/2019, 8:19 AM

## 2019-10-09 NOTE — Evaluation (Signed)
Physical Therapy Evaluation Patient Details Name: Brittney Gates MRN: [id number] DOB: [birth year] Today's Date: 10/09/2019   History of Present Illness  86 year old female with history of hypertension, moderate aortic stenosis echo in April with normal EF, hypothyroidism, chronic anemia, chronic low back pain, PAD who came to the ED for evaluation of shortness of breath.  Clinical Impression  Pt admitted with above diagnosis. Comes from home where she lives with her sone in a one level toenhouse-type place with a level entry; at baseline, able to walkd with RW; Presents to PT with decr functional mobility, decr activity tolerance;  Pt currently with functional limitations due to the deficits listed below (see PT Problem List). Pt will benefit from skilled PT to increase their independence and safety with mobility to allow discharge to the venue listed below.       Follow Up Recommendations Home health PT (Consider [facility])    Equipment Recommendations  None recommended by PT    Recommendations for Other Services       Precautions / Restrictions Precautions Precautions: Fall      Mobility  Bed Mobility Overal bed mobility: Needs Assistance Bed Mobility: Supine to Sit     Supine to sit: Min guard     General bed mobility comments: Minguard assist for safety  Transfers Overall transfer level: Needs assistance Equipment used: Rolling walker (2 wheeled) Transfers: Sit to/from [facility] Sit to Stand: Min assist Stand pivot transfers: Min assist       General transfer comment: Min assist to steady during power up, and with pivotal steps to recliner  Ambulation/Gait                Stairs            Wheelchair Mobility    Modified Rankin (Stroke Patients Only)       Balance Overall balance assessment: Mild deficits observed, not formally tested                                           Pertinent  Vitals/Pain Pain Assessment: No/denies pain    Home Living Family/patient expects to be discharged to:: Private residence Living Arrangements: Children (lives with her son) Available Help at Discharge: Family Type of Home: House (town home) Home Access: Level entry     Home Layout: One level Home Equipment: Environmental consultant - 2 wheels      Prior Function Level of Independence: Independent with assistive device(s)         Comments: Uses RW to walk     Hand Dominance        Extremity/Trunk Assessment   Upper Extremity Assessment Upper Extremity Assessment: Generalized weakness    Lower Extremity Assessment Lower Extremity Assessment: Generalized weakness       Communication   Communication: No difficulties  Cognition Arousal/Alertness: Awake/alert Behavior During Therapy: WFL for tasks assessed/performed (for simple mobility tasks) Overall Cognitive Status: Within Functional Limits for tasks assessed                                        General Comments General comments (skin integrity, edema, etc.): session conducted on Room Air; VSS    Exercises     Assessment/Plan    PT Assessment Patient needs continued PT  services  PT Problem List Decreased strength;Decreased activity tolerance;Decreased balance;Decreased mobility;Decreased knowledge of use of DME;Decreased safety awareness       PT Treatment Interventions DME instruction;Gait training;Stair training;Functional mobility training;Therapeutic activities;Therapeutic exercise;Balance training;Neuromuscular re-education;Cognitive remediation;Patient/family education    PT Goals (Current goals can be found in the Care Plan section)  Acute Rehab PT Goals Patient Stated Goal: to get home PT Goal Formulation: With patient Time For Goal Achievement: 10/23/19 Potential to Achieve Goals: Good    Frequency Min 3X/week   Barriers to discharge        Co-evaluation               AM-PAC PT  "6 Clicks" Mobility  Outcome Measure Help needed turning from your back to your side while in a flat bed without using bedrails?: A Little Help needed moving from lying on your back to sitting on the side of a flat bed without using bedrails?: A Little Help needed moving to and from a bed to a chair (including a wheelchair)?: A Little Help needed standing up from a chair using your arms (e.g., wheelchair or bedside chair)?: A Little Help needed to walk in hospital room?: A Little Help needed climbing 3-5 steps with a railing? : A Lot 6 Click Score: 17    End of Session Equipment Utilized During Treatment: Gait belt Activity Tolerance: Patient tolerated treatment well Patient left: in chair;with call bell/phone within reach;with chair alarm set Nurse Communication: Mobility status PT Visit Diagnosis: Unsteadiness on feet (R26.81);Other abnormalities of gait and mobility (R26.89)    Time: 1916-6060 PT Time Calculation (min) (ACUTE ONLY): 27 min   Charges:   PT Evaluation $PT Eval Moderate Complexity: 1 Mod PT Treatments $Therapeutic Activity: 8-22 mins        Roney Marion, PT  Acute Rehabilitation Services Pager [phone] Office [phone]   [place] 10/09/2019, 7:10 PM

## 2019-10-10 DIAGNOSIS — N179 Acute kidney failure, unspecified: Secondary | ICD-10-CM

## 2019-10-10 DIAGNOSIS — I2510 Atherosclerotic heart disease of native coronary artery without angina pectoris: Secondary | ICD-10-CM

## 2019-10-10 DIAGNOSIS — I200 Unstable angina: Secondary | ICD-10-CM

## 2019-10-10 DIAGNOSIS — I2583 Coronary atherosclerosis due to lipid rich plaque: Secondary | ICD-10-CM

## 2019-10-10 LAB — CBC
HCT: 30.8 % — ABNORMAL LOW (ref 36.0–46.0)
Hemoglobin: 10.2 g/dL — ABNORMAL LOW (ref 12.0–15.0)
MCH: 34.9 pg — ABNORMAL HIGH (ref 26.0–34.0)
MCHC: 33.1 g/dL (ref 30.0–36.0)
MCV: 105.5 fL — ABNORMAL HIGH (ref 80.0–100.0)
Platelets: 112 10*3/uL — ABNORMAL LOW (ref 150–400)
RBC: 2.92 MIL/uL — ABNORMAL LOW (ref 3.87–5.11)
RDW: 20 % — ABNORMAL HIGH (ref 11.5–15.5)
WBC: 7.3 10*3/uL (ref 4.0–10.5)
nRBC: 0.5 % — ABNORMAL HIGH (ref 0.0–0.2)

## 2019-10-10 LAB — BASIC METABOLIC PANEL
Anion gap: 9 (ref 5–15)
BUN: 36 mg/dL — ABNORMAL HIGH (ref 8–23)
CO2: 29 mmol/L (ref 22–32)
Calcium: 9 mg/dL (ref 8.9–10.3)
Chloride: 101 mmol/L (ref 98–111)
Creatinine, Ser: 1.64 mg/dL — ABNORMAL HIGH (ref 0.44–1.00)
GFR calc Af Amer: 31 mL/min — ABNORMAL LOW (ref >60)
GFR calc non Af Amer: 27 mL/min — ABNORMAL LOW (ref >60)
Glucose, Bld: 109 mg/dL — ABNORMAL HIGH (ref 70–99)
Potassium: 4 mmol/L (ref 3.5–5.1)
Sodium: 139 mmol/L (ref 135–145)

## 2019-10-10 NOTE — Progress Notes (Signed)
PROGRESS NOTE    Brittney Gates  OMV:[id number] DOB: [birth year] DOA: 10/05/2019 PCP: [name], MD   Brief Narrative:   86 year old female with history of hypertension, moderate aortic stenosis echo in April with normal EF, hypothyroidism, chronic anemia, chronic low back pain, PAD who came to the ED for evaluation of shortness of breath. Symptom onset with mild shortness of breath 2 weeks ago but constant past 1 week son noted clammy with intermittent pallor and flushing before EMS arrived when she became acutely more short of breath, son gave 8 baby aspirin, symptoms improved with nitroglycerin. She also complained of nonproductive cough, PND, and 1 week of intermittent sharp right-sided headache  7/12: Scr continues to rise. Hold Scr. She is breathing fine on RA. If Scr trends back down, consider for discharge tomorrow.    Assessment & Plan:   Principal Problem:   Left chest pressure Active Problems:   Hypothyroidism   Hyperlipidemia   Essential hypertension   ANEMIA   Osteoporosis   Aortic stenosis   Unstable angina (HCC)   AKI (acute kidney injury) (HCC)   CKD (chronic kidney disease) stage 3, GFR 30-59 ml/min   CAD (coronary artery disease)   Acute HFrEF (heart failure with reduced ejection fraction) (HCC)  Acute HFrEF - cards onboard, appreciate assistance - TTE on admission showed reduced EF of 30-35% with global hypokinesis of the left ventricle.  - LHC did not show evidence of significant stenosis to explain new EF reduction. - Recommend repeat outpatient TTE in 1-2 weeks to re-evaluate - transitioned to oral lasix     - 7/12: On RA. States resp status is good. Scr up. Lasix held. Hopefully can resume tomorrow, maybe at lower dose; home soon?  CAD  - EKG on admission showed lateral ST depression with T wave inversion.  - Mild troponin elevation that flattened.  - LHC showed evidence of Proximal RCA lesion with 80% stenosis.  Intervention unable to be pursued given severe calcification.  - If patient has refractory angina, could consider PCI with atherectomy, although high risk. No angina noted today. Additionally, mid-LAD lesion notable for 50% stenosis. - appreciate cards assistance  - Aspirin 81 mg daily - Nitroglycerin PRN for CP  Aortic Stenosis - Severe per most recent echo, however moderate on cath.  - Mean gradient pressure of 18 mm Hg with AVA of 1.5 cm^2.  - Asymptomatic at this time. No intervention necessary but recommend close outpatient follow up.   AKI on CKD3a     - SCr baseline is 1 - 1.1.  - Scr is up to 1.64; lasix held, follow  HTN - Home medication includes Amlodipine-Benazepril.  - ACE initially held d/t AKI - continue benazepril, amlodipine  Macrocytic anemia - no evidence of bleed - B12, THF, TSH normal  Hypothyroidism - continue synthroid  Insomina - continue ambien  Neuropathy - continue lyrica  DVT prophylaxis: SCDs Code Status: DNR Family Communication: None at bedside   Status is: Inpatient  Remains inpatient appropriate because:Creatinine continuing to rise.    Dispo: The patient is from: Home              Anticipated d/c is to: Home              Anticipated d/c date is: 1 day              Patient currently is not medically stable to d/c.  Consultants:   Cardiology  Procedures:   LHC  ROS:  Denies CP, N, V, dyspnea . Remainder 10-pt ROS is negative for all not previously mentioned.  Subjective: "My kidneys can be fixed right?"  Objective: Vitals:   10/10/19 0740 10/10/19 0826 10/10/19 1256 10/10/19 1652  BP:  (!) 105/54  108/64  Pulse:  63  61  Resp:  18  20  Temp:  98.2 F (36.8 C)  97.6 F (36.4 C)  TempSrc:  Oral  Oral  SpO2: 95% 98% 98% 100%  Weight:      Height:        Intake/Output Summary (Last 24 hours) at 10/10/2019 1655 Last data filed at 10/10/2019  1504 Gross per 24 hour  Intake 480 ml  Output 150 ml  Net 330 ml   Filed Weights   10/08/19 0418 10/09/19 0602 10/10/19 0506  Weight: 78.8 kg 76.7 kg 76.7 kg    Examination:  General: 86 y.o. female resting in bed in NAD Cardiovascular: RRR, +S1, S2, no m/g/r Respiratory: CTABL, no w/r/r, normal WOB GI: BS+, NDNT, no masses noted, no organomegaly noted MSK: No e/c/c Neuro: A&O x 3, no focal deficits Psyc: Appropriate interaction and affect, calm/cooperative   Data Reviewed: I have personally reviewed following labs and imaging studies.  CBC: Recent Labs  Lab 10/05/19 2217 10/05/19 2217 10/07/19 0031 10/07/19 0904 10/07/19 0912 10/07/19 0913 10/08/19 0338 10/09/19 0330 10/10/19 0351  WBC 11.8*  --  9.8  --   --   --  7.9 7.0 7.3  HGB 10.7*   < > 9.4*   < > 10.5* 10.5* 9.7* 10.4* 10.2*  HCT 32.4*   < > 29.2*   < > 31.0* 31.0* 29.5* 31.5* 30.8*  MCV 106.2*  --  107.7*  --   --   --  106.9* 106.1* 105.5*  PLT 162  --  144*  --   --   --  143* 123* 112*   < > = values in this interval not displayed.   Basic Metabolic Panel: Recent Labs  Lab 10/06/19 0448 10/06/19 0448 10/07/19 0031 10/07/19 2725 10/07/19 0912 10/07/19 0913 10/08/19 0338 10/09/19 0330 10/10/19 0351  NA 142   < > 140   < > 143 143 141 141 139  K 3.8   < > 4.0   < > 3.7 3.8 3.6 3.5 4.0  CL 110  --  107  --   --   --  107 102 101  CO2 21*  --  26  --   --   --  26 28 29   GLUCOSE 132*  --  115*  --   --   --  111* 109* 109*  BUN 28*  --  24*  --   --   --  20 28* 36*  CREATININE 1.25*  --  1.05*  --   --   --  1.23* 1.43* 1.64*  CALCIUM 8.6*  --  8.5*  --   --   --  8.8* 9.0 9.0   < > = values in this interval not displayed.   GFR: Estimated Creatinine Clearance: 22.4 mL/min (A) (by C-G formula based on SCr of 1.64 mg/dL (H)). Liver Function Tests: Recent Labs  Lab 10/05/19 2217  AST 19  ALT 15  ALKPHOS 61  BILITOT 0.5  PROT 6.8  ALBUMIN 3.7   Recent Labs  Lab 10/05/19 2229   LIPASE 23   No results for input(s): AMMONIA in the last 168 hours. Coagulation Profile: Recent Labs  Lab 10/06/19 0448  INR 1.0   Cardiac Enzymes: No results for input(s): CKTOTAL, CKMB, CKMBINDEX, TROPONINI in the last 168 hours. BNP (last 3 results) No results for input(s): PROBNP in the last 8760 hours. HbA1C: No results for input(s): HGBA1C in the last 72 hours. CBG: Recent Labs  Lab 10/05/19 2247  GLUCAP 99   Lipid Profile: No results for input(s): CHOL, HDL, LDLCALC, TRIG, CHOLHDL, LDLDIRECT in the last 72 hours. Thyroid Function Tests: Recent Labs    10/09/19 1122  TSH 4.157   Anemia Panel: Recent Labs    10/08/19 0952  VITAMINB12 331  FOLATE 62.0   Sepsis Labs: No results for input(s): PROCALCITON, LATICACIDVEN in the last 168 hours.  Recent Results (from the past 240 hour(s))  SARS Coronavirus 2 by RT PCR (hospital order, performed in [facility] hospital lab) Nasopharyngeal Nasopharyngeal Swab     Status: None   Collection Time: 10/06/19 12:27 AM   Specimen: Nasopharyngeal Swab  Result Value Ref Range Status   SARS Coronavirus 2 NEGATIVE NEGATIVE Final    Comment: (NOTE) SARS-CoV-2 target nucleic acids are NOT DETECTED.  The SARS-CoV-2 RNA is generally detectable in upper and lower respiratory specimens during the acute phase of infection. The lowest concentration of SARS-CoV-2 viral copies this assay can detect is 250 copies / mL. A negative result does not preclude SARS-CoV-2 infection and should not be used as the sole basis for treatment or other patient management decisions.  A negative result may occur with improper specimen collection / handling, submission of specimen other than nasopharyngeal swab, presence of viral mutation(s) within the areas targeted by this assay, and inadequate number of viral copies (<250 copies / mL). A negative result must be combined with clinical observations, patient history, and epidemiological  information.  Fact Sheet for Patients:   StrictlyIdeas.no  Fact Sheet for Healthcare Providers: BankingDealers.co.za  This test is not yet approved or  cleared by the [place] FDA and has been authorized for detection and/or diagnosis of SARS-CoV-2 by FDA under an Emergency Use Authorization (EUA).  This EUA will remain in effect (meaning this test can be used) for the duration of the COVID-19 declaration under Section 564(b)(1) of the Act, 21 U.S.C. section 360bbb-3(b)(1), unless the authorization is terminated or revoked sooner.  Performed at [facility], [place] [address], [place], [place] [address]   MRSA PCR Screening     Status: None   Collection Time: 10/06/19  5:43 AM   Specimen: Nasopharyngeal  Result Value Ref Range Status   MRSA by PCR NEGATIVE NEGATIVE Final    Comment:        The GeneXpert MRSA Assay (FDA approved for NASAL specimens only), is one component of a comprehensive MRSA colonization surveillance program. It is not intended to diagnose MRSA infection nor to guide or monitor treatment for MRSA infections. Performed at [facility], [place] [address] [place], [place] [address]       Radiology Studies: No results found.   Scheduled Meds: . aspirin EC  81 mg Oral Daily  . chlorhexidine  15 mL Mouth Rinse BID  . Chlorhexidine Gluconate Cloth  6 each Topical Daily  . levothyroxine  100 mcg Oral Q0600  . mouth rinse  15 mL Mouth Rinse q12n4p  . multivitamin with minerals  1 tablet Oral Daily  . pregabalin  50 mg Oral Daily  . sodium chloride flush  3 mL Intravenous Q12H  . zolpidem  5 mg Oral QHS,MR X 1  Continuous Infusions: . sodium chloride       LOS: 4 days    Time spent: 25 minutes spent in the coordination of care today.    Jonnie Finner, DO [facility]  If 7PM-7AM, please contact night-coverage [email or web address] 10/10/2019, 4:55 PM

## 2019-10-10 NOTE — Progress Notes (Addendum)
Progress Note  Patient Name: Brittney Gates Date of Encounter: 10/10/2019  Primary Cardiologist: Sherren Mocha, MD   Subjective   Denies any chest pain and SOB continues to improve  Inpatient Medications    Scheduled Meds: . aspirin EC  81 mg Oral Daily  . benazepril  20 mg Oral Daily  . chlorhexidine  15 mL Mouth Rinse BID  . Chlorhexidine Gluconate Cloth  6 each Topical Daily  . furosemide  40 mg Oral BID  . levothyroxine  100 mcg Oral Q0600  . mouth rinse  15 mL Mouth Rinse q12n4p  . multivitamin with minerals  1 tablet Oral Daily  . pregabalin  50 mg Oral Daily  . sodium chloride flush  3 mL Intravenous Q12H  . zolpidem  5 mg Oral QHS,MR X 1   Continuous Infusions: . sodium chloride     PRN Meds: sodium chloride, acetaminophen, guaiFENesin-dextromethorphan, HYDROcodone-acetaminophen, levalbuterol, nitroGLYCERIN, ondansetron (ZOFRAN) IV, senna-docusate, sodium chloride flush, technetium albumin aggregated   Vital Signs    Vitals:   10/10/19 0035 10/10/19 0506 10/10/19 0730 10/10/19 0740  BP: 99/61 (!) 112/52    Pulse: 89 79    Resp: 17 18    Temp: 97.6 F (36.4 C) 98.5 F (36.9 C)    TempSrc: Oral Oral    SpO2: 95% 98% 91% 95%  Weight:  76.7 kg    Height:        Intake/Output Summary (Last 24 hours) at 10/10/2019 0819 Last data filed at 10/09/2019 1426 Gross per 24 hour  Intake 360 ml  Output 1000 ml  Net -640 ml   Filed Weights   10/08/19 0418 10/09/19 0602 10/10/19 0506  Weight: 78.8 kg 76.7 kg 76.7 kg    Telemetry    NSR 10/10/2019   ECG    No new EKG to review  Physical Exam   GEN: Well nourished, well developed in no acute distress HEENT: Normal NECK: No JVD; No carotid bruits LYMPHATICS: No lymphadenopathy CARDIAC:RRR, no murmurs, rubs, gallops RESPIRATORY:  Clear to auscultation without rales, wheezing or rhonchi  ABDOMEN: Soft, non-tender, non-distended MUSCULOSKELETAL:  No edema; No deformity  SKIN: Warm and dry NEUROLOGIC:   Alert and oriented x 3 PSYCHIATRIC:  Normal affect    Labs    Chemistry Recent Labs  Lab 10/05/19 2217 10/06/19 0448 10/08/19 0338 10/09/19 0330 10/10/19 0351  NA 142   < > 141 141 139  K 4.1   < > 3.6 3.5 4.0  CL 109   < > 107 102 101  CO2 22   < > 26 28 29   GLUCOSE 134*   < > 111* 109* 109*  BUN 29*   < > 20 28* 36*  CREATININE 1.52*   < > 1.23* 1.43* 1.64*  CALCIUM 8.8*   < > 8.8* 9.0 9.0  PROT 6.8  --   --   --   --   ALBUMIN 3.7  --   --   --   --   AST 19  --   --   --   --   ALT 15  --   --   --   --   ALKPHOS 61  --   --   --   --   BILITOT 0.5  --   --   --   --   GFRNONAA 29*   < > 38* 31* 27*  GFRAA 34*   < > 44* 36* 31*  ANIONGAP 11   < >  8 11 9    < > = values in this interval not displayed.     Hematology Recent Labs  Lab 10/08/19 0338 10/09/19 0330 10/10/19 0351  WBC 7.9 7.0 7.3  RBC 2.76* 2.97* 2.92*  HGB 9.7* 10.4* 10.2*  HCT 29.5* 31.5* 30.8*  MCV 106.9* 106.1* 105.5*  MCH 35.1* 35.0* 34.9*  MCHC 32.9 33.0 33.1  RDW 20.7* 20.4* 20.0*  PLT 143* 123* 112*    Cardiac EnzymesNo results for input(s): TROPONINI in the last 168 hours. No results for input(s): TROPIPOC in the last 168 hours.   BNP Recent Labs  Lab 10/05/19 2254 10/07/19 0031  BNP 214.9* 344.0*     DDimer  Recent Labs  Lab 10/06/19 0448  DDIMER 1.47*     Radiology    No results found. Patient Profile     Ms. Brittney Gates is a 86 y/o female with a PMHx of HTN, HLD, PAD, moderate aortic stenosis, hypothyroidism,. CKD Stage 3 who is currently admitted for chest pain evaluation.   Assessment & Plan    1. CHF:    -TTE on admission showed reduced EF of 30-35% with global hypokinesis of the left ventricle.  -EF down disproportionate  to CAD   -she put out 1L yesterday and net neg 2.4L -PCWP at cath 26 mmHg -does not appear volume overloaded on exam today -creatinine bumped from 1.43 to 1.64 overnight -hold Lasix today and if Cr trending down tomorrow then decrease  lasix to 40mg  daily PO -repeat BMET in am  2. CAD:  -Medical Rx for calcified 80% proximal RCA  -If refractory angina can consider high risk atherectomy ASA  3. Aortic Stenosis:  -moderate by cath given age even TAVR not likely a good choice   4. AoCKD3:  -creatinine increased today to 1.64 from 1.43 likely post contrast and diuresis -hold Lasix today -repeat BMET in am  5. HTN:  -BP improved to 112/29mmHg -Will hold ACE I today due to bump in  Creatinine  I have spent a total of 35 minutes with patient reviewing hospital notes , telemetry, EKGs, labs and examining patient as well as establishing an assessment and plan that was discussed with the patient.  > 50% of time was spent in direct patient care.     For questions or updates, please contact [place] Please consult [email or web address] for contact info under Cardiology/STEMI.      Signed, Fransico Him, MD  10/10/2019, 8:19 AM

## 2019-10-10 NOTE — TOC Initial Note (Signed)
Transition of Care [facility]) - Initial/Assessment Note    Patient Details  Name: Brittney Gates MRN: [id number] Date of Birth: 05/06/1924  [facility] CM/SW Contact:    Ninfa Meeker, RN Phone Number: [phone] (working Remotely) 10/10/2019, 10:12 AM  Clinical Narrative:   Patient is a 86 y/o female with a PMHx of HTN, HLD, PAD, moderate aortic stenosis, hypothyroidism,. CKD Stage 3 who is currently admitted for chest pain evaluation. Case Manager spoke with patient and her son [name] via telephone to discuss [place] needs. John says they have used [facility] in the past and he will call CM with agency name after he returns home, he is presently at hospital with his mom.            Expected Discharge Plan: [place] Barriers to Discharge: Continued Medical Work up   Patient Goals and CMS Choice Patient states their goals for this hospitalization and ongoing recovery are:: get better CMS Medicare.gov Compare Post Acute Care list provided to:: Patient Represenative (must comment) (son: Rionna Feltes) Choice offered to / list presented to : Adult Children  Expected Discharge Plan and Services Expected Discharge Plan: [place]   Discharge Planning Services: CM Consult Post Acute Care Choice: Home Health Living arrangements for the past 2 months: Single Family Home                           HH Arranged: PT, OT          Prior Living Arrangements/Services Living arrangements for the past 2 months: Single Family Home Lives with:: Adult Children Patient language and need for interpreter reviewed:: Yes Do you feel safe going back to the place where you live?: Yes      Need for Family Participation in Patient Care: Yes (Comment) Care giver support system in place?: Yes (comment)   Criminal Activity/Legal Involvement Pertinent to Current Situation/Hospitalization: No - Comment as needed  Activities of Daily Living Home Assistive  Devices/Equipment: Built-in shower seat, Walker (specify type), Grab bars in shower, Grab bars around toilet (front wheeled walker) ADL Screening (condition at time of admission) Patient's cognitive ability adequate to safely complete daily activities?: Yes Is the patient deaf or have difficulty hearing?: Yes (slight hoh) Does the patient have difficulty seeing, even when wearing glasses/contacts?: No Does the patient have difficulty concentrating, remembering, or making decisions?: Yes Patient able to express need for assistance with ADLs?: Yes Does the patient have difficulty dressing or bathing?: Yes Independently performs ADLs?: No Communication: Independent Dressing (OT): Needs assistance Is this a change from baseline?: Pre-admission baseline Grooming: Needs assistance Is this a change from baseline?: Pre-admission baseline Feeding: Needs assistance Is this a change from baseline?: Pre-admission baseline Bathing: Needs assistance Is this a change from baseline?: Pre-admission baseline Toileting: Needs assistance Is this a change from baseline?: Pre-admission baseline In/Out Bed: Needs assistance Is this a change from baseline?: Pre-admission baseline Walks in Home: Needs assistance Is this a change from baseline?: Pre-admission baseline Does the patient have difficulty walking or climbing stairs?: Yes (secondary to weakness) Weakness of Legs: Both Weakness of Arms/Hands: None  Permission Sought/Granted                  Emotional Assessment   Attitude/Demeanor/Rapport: Gracious   Orientation: : Oriented to Self, Oriented to Place, Oriented to  Time, Oriented to Situation Alcohol / Substance Use: Not Applicable Psych Involvement: No (  comment)  Admission diagnosis:  Left chest pressure [R07.89] Patient Active Problem List   Diagnosis Date Noted  . AKI (acute kidney injury) ([place]) 10/08/2019  . CKD (chronic kidney disease) stage 3, GFR 30-59 ml/min 10/08/2019  . CAD  (coronary artery disease) 10/08/2019  . Acute HFrEF (heart failure with reduced ejection fraction) ([place]) 10/08/2019  . Unstable angina ([place])   . Left chest pressure 10/06/2019  . Aortic stenosis 10/06/2019  . Fever 11/25/2018  . Hand numbness 09/07/2018  . Weight gain 11/20/2017  . Ankle swelling 08/08/2016  . Spondylosis of lumbar spine 05/05/2016  . Palliative care by specialist 04/30/2016  . Lumbosacral radiculopathy at L5   . Sacral insufficiency fracture with delayed healing   . Lumbar radicular pain 04/28/2016  . Radicular low back pain 04/28/2016  . Routine general medical examination at a health care facility 11/01/2015  . Abnormal CT scan, chest 05/06/2012  . Osteoporosis 04/30/2012  . Hip fracture left 01/20/2012  . PVD (peripheral vascular disease) ([place])   . ANEMIA 04/24/2010  . Hypothyroidism 08/14/2007  . Hyperlipidemia 08/14/2007  . ANXIETY 08/14/2007  . Essential hypertension 08/14/2007  . Osteoarthritis 08/14/2007  . Lumbar back pain with radiculopathy affecting right lower extremity 08/14/2007   PCP:  [name], MD Pharmacy:   [facility], [address] [place] [address] Phone: [phone] Fax: [phone]     Social Determinants of Health (SDOH) Interventions    Readmission Risk Interventions No flowsheet data found.

## 2019-10-11 ENCOUNTER — Telehealth: Payer: Self-pay | Admitting: Cardiovascular Disease

## 2019-10-11 DIAGNOSIS — E785 Hyperlipidemia, unspecified: Secondary | ICD-10-CM

## 2019-10-11 DIAGNOSIS — N1831 Chronic kidney disease, stage 3a: Secondary | ICD-10-CM

## 2019-10-11 DIAGNOSIS — E039 Hypothyroidism, unspecified: Secondary | ICD-10-CM

## 2019-10-11 DIAGNOSIS — D649 Anemia, unspecified: Secondary | ICD-10-CM

## 2019-10-11 LAB — BASIC METABOLIC PANEL
Anion gap: 11 (ref 5–15)
BUN: 36 mg/dL — ABNORMAL HIGH (ref 8–23)
CO2: 28 mmol/L (ref 22–32)
Calcium: 8.9 mg/dL (ref 8.9–10.3)
Chloride: 99 mmol/L (ref 98–111)
Creatinine, Ser: 1.53 mg/dL — ABNORMAL HIGH (ref 0.44–1.00)
GFR calc Af Amer: 34 mL/min — ABNORMAL LOW (ref >60)
GFR calc non Af Amer: 29 mL/min — ABNORMAL LOW (ref >60)
Glucose, Bld: 103 mg/dL — ABNORMAL HIGH (ref 70–99)
Potassium: 4.1 mmol/L (ref 3.5–5.1)
Sodium: 138 mmol/L (ref 135–145)

## 2019-10-11 LAB — CBC
HCT: 29.5 % — ABNORMAL LOW (ref 36.0–46.0)
Hemoglobin: 9.8 g/dL — ABNORMAL LOW (ref 12.0–15.0)
MCH: 35.1 pg — ABNORMAL HIGH (ref 26.0–34.0)
MCHC: 33.2 g/dL (ref 30.0–36.0)
MCV: 105.7 fL — ABNORMAL HIGH (ref 80.0–100.0)
Platelets: 137 10*3/uL — ABNORMAL LOW (ref 150–400)
RBC: 2.79 MIL/uL — ABNORMAL LOW (ref 3.87–5.11)
RDW: 20.2 % — ABNORMAL HIGH (ref 11.5–15.5)
WBC: 6.7 10*3/uL (ref 4.0–10.5)
nRBC: 0 % (ref 0.0–0.2)

## 2019-10-11 MED ORDER — FUROSEMIDE 20 MG PO TABS: 20.0000 mg | ORAL_TABLET | Freq: Every day | ORAL | 0 refills | Status: DC

## 2019-10-11 MED ORDER — PREGABALIN 50 MG PO CAPS: 50.0000 mg | ORAL_CAPSULE | Freq: Every day | ORAL | Status: DC

## 2019-10-11 MED ORDER — NITROGLYCERIN 0.4 MG SL SUBL: 0.4000 mg | SUBLINGUAL_TABLET | SUBLINGUAL | 0 refills | Status: DC | PRN

## 2019-10-11 MED ORDER — FUROSEMIDE 20 MG PO TABS: 20.0000 mg | ORAL_TABLET | Freq: Every day | ORAL | Status: DC

## 2019-10-11 MED ORDER — FUROSEMIDE 40 MG PO TABS: 40.0000 mg | ORAL_TABLET | Freq: Every day | ORAL | Status: DC

## 2019-10-11 NOTE — Discharge Summary (Signed)
Physician Discharge Summary  Brittney Gates GMW:[id number] DOB: [birth year] DOA: 10/05/2019  PCP: [name], MD  Admit date: 10/05/2019 Discharge date: 10/11/2019  Admitted From: Home Disposition:  Discharged to home w/ [facility]  Recommendations for Outpatient Follow-up:  1. Follow up with PCP in 1 week 2. Please obtain BMP/CBC in one week 3. Follow up with cardiology in 1 week  Discharge Condition: Stable  CODE STATUS: DNR   Brief/Interim Summary: 86 year old female with history of hypertension, moderate aortic stenosis echo in April with normal EF, hypothyroidism, chronic anemia, chronic low back pain, PAD who came to the ED for evaluation of shortness of breath. Symptom onset with mild shortness of breath 2 weeks ago but constant past 1 week son noted clammy with intermittent pallor and flushing before EMS arrived when she became acutely more short of breath, son gave 8 baby aspirin, symptoms improved with nitroglycerin. She also complained of nonproductive cough, PND, and 1 week of intermittent sharp right-sided headache  7/13: Comfortable on RA. Scr improving. Will continue lasix 20mg  at discharge. Hold lotrel at discharge. Follow up with PCP and cardiology in 1 week. She will discharge to home with [facility]. Discharge plan and instructions explained in full detail to pt and family member at bedside. They have voiced understanding.   Discharge Diagnoses:  Principal Problem:   Left chest pressure Active Problems:   Hypothyroidism   Hyperlipidemia   Essential hypertension   ANEMIA   Osteoporosis   Aortic stenosis   Unstable angina (HCC)   AKI (acute kidney injury) (HCC)   CKD (chronic kidney disease) stage 3, GFR 30-59 ml/min   CAD (coronary artery disease)   Acute HFrEF (heart failure with reduced ejection fraction) (HCC)  Acute HFrEF - cards onboard, appreciate assistance - TTE on admission showed reduced EF of 30-35% with global hypokinesis of the left ventricle.   - LHC did not show evidence of significant stenosis to explain new EF reduction. - Recommend repeat outpatient TTE in 1-2 weeks to re-evaluate - transitionedto oral lasix - 7/13: Scr coming down again. Will go home on lasix 20mg . Hold lotrel at discharge. Follow up with PCP and cardiology in 1 week.   CAD  - EKG on admission showed lateral ST depression with T wave inversion.  - Mild troponin elevation that flattened.  - LHC showed evidence of Proximal RCA lesion with 80% stenosis. Intervention unable to be pursued given severe calcification.  - If patient has refractory angina, could consider PCI with atherectomy, although high risk. No angina noted today. Additionally, mid-LAD lesion notable for 50% stenosis. - appreciate cards assistance  - Aspirin 81 mg daily - Nitroglycerin PRN for CP  Aortic Stenosis - Severe per most recent echo, however moderate on cath.  - Mean gradient pressure of 18 mm Hg with AVA of 1.5 cm^2.  - Asymptomatic at this time. No intervention necessary but recommend close outpatient follow up.   AKI on CKD3a - SCr baseline is 1 - 1.1. - Scr is up to 1.64; lasix held, follow  HTN - Home medication includes Amlodipine-Benazepril.  - ACE initially held d/t AKI - hold lotrel at discharge; continue lasix 20mg ; follow up with PCP and cardiology  Macrocytic anemia - no evidence of bleed -B12, THF, TSH normal  Hypothyroidism - continue synthroid  Insomina - continue ambien  Neuropathy - continue lyrica  Generalized weakness     - PT rec HHPT, have placed order.  Discharge Instructions   Allergies as of 10/11/2019  Reactions   Statins Other (See Comments)   Leg weakness   Lidocaine    Per patient   Metamucil [psyllium]    Difficulty swallowing after taking      Medication List    STOP taking these medications   amLODipine-benazepril  5-20 MG capsule Commonly known as: LOTREL     TAKE these medications   acetaminophen 325 MG tablet Commonly known as: TYLENOL Take 325 mg by mouth every 6 (six) hours as needed for mild pain, moderate pain or headache.   aspirin EC 81 MG tablet Take 81 mg by mouth daily.   Cholecalciferol 50 MCG (2000 UT) Tabs Take 2,000 Units by mouth daily.   furosemide 20 MG tablet Commonly known as: LASIX Take 1 tablet (20 mg total) by mouth daily.   HYDROcodone-acetaminophen 5-325 MG tablet Commonly known as: NORCO/VICODIN Take 1 tablet by mouth every 4 (four) hours as needed for moderate pain. What changed: how much to take   levothyroxine 100 MCG tablet Commonly known as: SYNTHROID TAKE ONE TABLET BY MOUTH EVERY MORNING BEFORE BREAKFAST What changed: when to take this   multivitamin with minerals Tabs tablet Take 1 tablet by mouth daily.   nitroGLYCERIN 0.4 MG SL tablet Commonly known as: NITROSTAT Place 1 tablet (0.4 mg total) under the tongue every 5 (five) minutes as needed for up to 30 doses for chest pain.   pregabalin 50 MG capsule Commonly known as: LYRICA Take 1 capsule (50 mg total) by mouth daily.   sennosides-docusate sodium 8.6-50 MG tablet Commonly known as: SENOKOT-S Take 2 tablets by mouth daily as needed for constipation.   TRIPLE OMEGA-3-6-9 PO Take 1 capsule by mouth daily.   zolpidem 10 MG tablet Commonly known as: AMBIEN TAKE ONE TABLET BY MOUTH EVERY NIGHT AT BEDTIME AS NEEDED FOR SLEEP What changed: See the new instructions.       Allergies  Allergen Reactions  . Statins Other (See Comments)    Leg weakness  . Lidocaine     Per patient  . Metamucil [Psyllium]     Difficulty swallowing after taking    Consultations:  Cardiology  Procedures/Studies: CT Head Wo Contrast  Result Date: 10/06/2019 CLINICAL DATA:  Right-sided headache EXAM: CT HEAD WITHOUT CONTRAST TECHNIQUE: Contiguous axial images were obtained from the base of the skull  through the vertex without intravenous contrast. COMPARISON:  01/20/2012 FINDINGS: Brain: No evidence of acute infarction, hemorrhage, hydrocephalus, extra-axial collection or mass lesion/mass effect. Brain atrophy especially notable at the sylvian fissures and occipital parietal sulci. Chronic small vessel ischemia throughout the deep cerebral white matter. Vascular: No hyperdense vessel or unexpected calcification. Skull: Normal. Negative for fracture or focal lesion. Bilateral TMJ osteoarthritis Sinuses/Orbits: No acute finding. IMPRESSION: Senescent changes without acute or reversible finding. Electronically Signed   By: Monte Fantasia M.D.   On: 10/06/2019 04:06   NM Pulmonary Perfusion  Result Date: 10/06/2019 CLINICAL DATA:  Chest pain with elevated D-dimer EXAM: NUCLEAR MEDICINE PERFUSION LUNG SCAN VIEWS: Anterior, posterior, left lateral, right lateral, RPO, LPO, RAO, LAO RADIOPHARMACEUTICALS:  4.4 mCi Tc-69m MAA IV COMPARISON:  Chest radiograph October 06, 2019 FINDINGS: Radiotracer uptake is homogeneous and symmetric bilaterally. No appreciable perfusion defects. IMPRESSION: No appreciable perfusion defects.  No evidence of pulmonary embolus. Electronically Signed   By: Lowella Grip III M.D.   On: 10/06/2019 11:40   CARDIAC CATHETERIZATION  Result Date: 10/07/2019  Prox RCA lesion is 80% stenosed. Mid RCA lesion is 80% stenosed. Heavily calcified disease that  would require atherectomy for PCI.  Mid LAD lesion is 50% stenosed.  There is moderate left ventricular systolic dysfunction.  The left ventricular ejection fraction is 25-35% by visual estimate.  LV end diastolic pressure is mildly elevated.  There is moderate aortic valve stenosis. Mean gradient 18 mm Hg. Valve area 1.5 cm2  Ao sat 89% on RA, PA sat 64% on RA; PA pressure 53/23, mean PA pressure 38 mm Hg; mean PCWP 26 mm Hg; CO 7.6 L.min; CI 4.1  Continue with management of LV dysfunction, which seems out of proportion to CAD. Will  watch at [facility] for continued diuresis and medicine titration. If she has refractory angina, could consider high risk PCI of RCA with atherectomy.  Findings discussed with the son, Ruthie Berch.   DG Chest Port 1 View  Result Date: 10/06/2019 CLINICAL DATA:  Malaise, hypertension, aortic stenosis EXAM: PORTABLE CHEST 1 VIEW COMPARISON:  10/05/2019 FINDINGS: Bibasilar pulmonary infiltrates noted on prior examination have significantly improved and trace residual interstitial infiltrate is noted within the lung bases bilaterally. The relatively rapid improvement suggests that this represents edema on prior examination. The lungs are well expanded and are symmetric. No pneumothorax or pleural effusion. Cardiac size within normal limits. No acute bone abnormality. IMPRESSION: Marked interval improvement in probable pulmonary edema noted on prior examination. Trace pulmonary edema persists. Electronically Signed   By: Fidela Salisbury MD   On: 10/06/2019 08:40   DG Chest Portable 1 View  Result Date: 10/05/2019 CLINICAL DATA:  Shortness of breath. EXAM: PORTABLE CHEST 1 VIEW COMPARISON:  October 20, 2016 FINDINGS: Stable mild diffuse chronic appearing increased lung markings are seen. Mild to moderate severity areas of atelectasis and/or infiltrate are seen within the bilateral lung bases. Small bilateral pleural effusions are noted. No pneumothorax is identified. The cardiac silhouette is mildly enlarged. Multilevel degenerative changes seen throughout the thoracic spine. IMPRESSION: 1. Mild to moderate severity areas of atelectasis and/or infiltrate within the bilateral lung bases. 2. Small bilateral pleural effusions. Electronically Signed   By: Virgina Norfolk M.D.   On: 10/05/2019 22:44   ECHOCARDIOGRAM COMPLETE  Result Date: 10/06/2019    ECHOCARDIOGRAM REPORT   Patient Name:   Brittney Gates Date of Exam: 10/06/2019 Medical Rec #:  [id number]     Height:       66.0 in Accession #:    [id number]    Weight:        165.0 lb Date of Birth:  15-Sep-1924      BSA:          1.843 m Patient Age:    58 years      BP:           137/114 mmHg Patient Gender: F             HR:           99 bpm. Exam Location:  Inpatient Procedure: 2D Echo, 3D Echo, Color Doppler and Cardiac Doppler Indications:    G29.52 Acute diastolic (congestive) heart failure, Aortic                 Stenosis i35.0, Elevated Troponins  History:        Patient has prior history of Echocardiogram examinations, most                 recent 07/21/2019. Risk Factors:Hypertension and Dyslipidemia.  Sonographer:    Raquel Sarna Senior RDCS Referring Phys: [phone] JARED M GARDNER  Sonographer Comments: Scanned sitting up  due to dyspnea. IMPRESSIONS  1. Left ventricular ejection fraction, by estimation, is 30 to 35%. The left ventricle has moderately decreased function. The left ventricle demonstrates global hypokinesis. Indeterminate diastolic filling due to E-A fusion.  2. Right ventricular systolic function is normal. The right ventricular size is normal. Tricuspid regurgitation signal is inadequate for assessing PA pressure.  3. Left atrial size was mild to moderately dilated.  4. The mitral valve is degenerative. Mild to moderate mitral valve regurgitation. No evidence of mitral stenosis.  5. The aortic valve is tricuspid. Aortic valve regurgitation is not visualized. Severe aortic valve stenosis (possible low gradient severe AS). Aortic valve area, by VTI measures 0.84 cm. Aortic valve mean gradient measures 26.0 mmHg.  6. The inferior vena cava is dilated in size with >50% respiratory variability, suggesting right atrial pressure of 8 mmHg.  7. There is a pleural effusion. FINDINGS  Left Ventricle: Left ventricular ejection fraction, by estimation, is 30 to 35%. The left ventricle has moderately decreased function. The left ventricle demonstrates global hypokinesis. The left ventricular internal cavity size was normal in size. There is no left ventricular hypertrophy. Indeterminate  diastolic filling due to E-A fusion. Right Ventricle: The right ventricular size is normal. No increase in right ventricular wall thickness. Right ventricular systolic function is normal. Tricuspid regurgitation signal is inadequate for assessing PA pressure. Left Atrium: Left atrial size was mild to moderately dilated. Right Atrium: Right atrial size was normal in size. Pericardium: There is a pleural effusion. Trivial pericardial effusion is present. Mitral Valve: The mitral valve is degenerative in appearance. Moderate mitral annular calcification. Mild to moderate mitral valve regurgitation. No evidence of mitral valve stenosis. Tricuspid Valve: The tricuspid valve is normal in structure. Tricuspid valve regurgitation is not demonstrated. Aortic Valve: The aortic valve is tricuspid. Aortic valve regurgitation is not visualized. Severe aortic stenosis is present. Aortic valve mean gradient measures 26.0 mmHg. Aortic valve peak gradient measures 41.0 mmHg. Aortic valve area, by VTI measures  0.84 cm. Pulmonic Valve: The pulmonic valve was normal in structure. Pulmonic valve regurgitation is trivial. Aorta: The aortic root is normal in size and structure. Venous: The inferior vena cava is dilated in size with greater than 50% respiratory variability, suggesting right atrial pressure of 8 mmHg. IAS/Shunts: No atrial level shunt detected by color flow Doppler.  LEFT VENTRICLE PLAX 2D LVIDd:         5.30 cm  Diastology LVIDs:         4.90 cm  LV e' lateral:   9.25 cm/s LV PW:         0.80 cm  LV E/e' lateral: 15.1 LV IVS:        1.10 cm  LV e' medial:    6.31 cm/s LVOT diam:     2.00 cm  LV E/e' medial:  22.2 LV SV:         62 LV SV Index:   34 LVOT Area:     3.14 cm  RIGHT VENTRICLE RV S prime:     13.20 cm/s TAPSE (M-mode): 2.0 cm LEFT ATRIUM              Index       RIGHT ATRIUM           Index LA diam:        4.70 cm  2.55 cm/m  RA Area:     15.60 cm LA Vol (A2C):   102.0 ml 55.35 ml/m RA Volume:  35.90 ml   19.48 ml/m LA Vol (A4C):   55.0 ml  29.85 ml/m LA Biplane Vol: 75.5 ml  40.97 ml/m  AORTIC VALVE AV Area (Vmax):    1.00 cm AV Area (Vmean):   0.95 cm AV Area (VTI):     0.84 cm AV Vmax:           320.00 cm/s AV Vmean:          239.000 cm/s AV VTI:            0.735 m AV Peak Grad:      41.0 mmHg AV Mean Grad:      26.0 mmHg LVOT Vmax:         102.00 cm/s LVOT Vmean:        72.500 cm/s LVOT VTI:          0.197 m LVOT/AV VTI ratio: 0.27  AORTA Ao Root diam: 2.90 cm Ao Asc diam:  3.40 cm MITRAL VALVE MV Area (PHT): 4.96 cm      SHUNTS MV Decel Time: 153 msec      Systemic VTI:  0.20 m MR Peak grad:    135.5 mmHg  Systemic Diam: 2.00 cm MR Mean grad:    88.0 mmHg MR Vmax:         582.00 cm/s MR Vmean:        447.0 cm/s MR PISA:         1.57 cm MR PISA Eff ROA: 10 mm MR PISA Radius:  0.50 cm MV E velocity: 140.00 cm/s Loralie Champagne MD Electronically signed by Loralie Champagne MD Signature Date/Time: 10/06/2019/2:58:21 PM    Final       Subjective: "Am I going today?"  Discharge Exam: Vitals:   10/11/19 0450 10/11/19 0741  BP: 117/67 (!) 114/53  Pulse:  68  Resp:  17  Temp: 97.9 F (36.6 C) 97.9 F (36.6 C)  SpO2:  96%   Vitals:   10/10/19 1652 10/10/19 2049 10/11/19 0450 10/11/19 0741  BP: 108/64 119/82 117/67 (!) 114/53  Pulse: 61 76  68  Resp: 20 19  17   Temp: 97.6 F (36.4 C) 98.2 F (36.8 C) 97.9 F (36.6 C) 97.9 F (36.6 C)  TempSrc: Oral Oral Oral Oral  SpO2: 100% 98%  96%  Weight:   77.6 kg   Height:        General: 86 y.o. female resting in bed in NAD Cardiovascular: RRR, +S1, S2, no m/g/r, equal pulses throughout Respiratory: CTABL, no w/r/r, normal WOB GI: BS+, NDNT, no masses noted, no organomegaly noted MSK: No e/c/c Neuro: A&O x 3, no focal deficits Psyc: Appropriate interaction and affect, calm/cooperative  The results of significant diagnostics from this hospitalization (including imaging, microbiology, ancillary and laboratory) are listed below for reference.      Microbiology: Recent Results (from the past 240 hour(s))  SARS Coronavirus 2 by RT PCR (hospital order, performed in [facility] hospital lab) Nasopharyngeal Nasopharyngeal Swab     Status: None   Collection Time: 10/06/19 12:27 AM   Specimen: Nasopharyngeal Swab  Result Value Ref Range Status   SARS Coronavirus 2 NEGATIVE NEGATIVE Final    Comment: (NOTE) SARS-CoV-2 target nucleic acids are NOT DETECTED.  The SARS-CoV-2 RNA is generally detectable in upper and lower respiratory specimens during the acute phase of infection. The lowest concentration of SARS-CoV-2 viral copies this assay can detect is 250 copies / mL. A negative result does not preclude SARS-CoV-2 infection and should not  be used as the sole basis for treatment or other patient management decisions.  A negative result may occur with improper specimen collection / handling, submission of specimen other than nasopharyngeal swab, presence of viral mutation(s) within the areas targeted by this assay, and inadequate number of viral copies (<250 copies / mL). A negative result must be combined with clinical observations, patient history, and epidemiological information.  Fact Sheet for Patients:   StrictlyIdeas.no  Fact Sheet for Healthcare Providers: BankingDealers.co.za  This test is not yet approved or  cleared by the [place] FDA and has been authorized for detection and/or diagnosis of SARS-CoV-2 by FDA under an Emergency Use Authorization (EUA).  This EUA will remain in effect (meaning this test can be used) for the duration of the COVID-19 declaration under Section 564(b)(1) of the Act, 21 U.S.C. section 360bbb-3(b)(1), unless the authorization is terminated or revoked sooner.  Performed at [facility], [place] [address], [place], [place] [address]   MRSA PCR Screening     Status: None   Collection Time: 10/06/19  5:43 AM   Specimen:  Nasopharyngeal  Result Value Ref Range Status   MRSA by PCR NEGATIVE NEGATIVE Final    Comment:        The GeneXpert MRSA Assay (FDA approved for NASAL specimens only), is one component of a comprehensive MRSA colonization surveillance program. It is not intended to diagnose MRSA infection nor to guide or monitor treatment for MRSA infections. Performed at [facility], [place] [address], [place], [place] [address]      Labs: BNP (last 3 results) Recent Labs    10/05/19 2254 10/07/19 0031  BNP 214.9* 299.2*   Basic Metabolic Panel: Recent Labs  Lab 10/07/19 0031 10/07/19 0904 10/07/19 0913 10/08/19 0338 10/09/19 0330 10/10/19 0351 10/11/19 0424  NA 140   < > 143 141 141 139 138  K 4.0   < > 3.8 3.6 3.5 4.0 4.1  CL 107  --   --  107 102 101 99  CO2 26  --   --  26 28 29 28   GLUCOSE 115*  --   --  111* 109* 109* 103*  BUN 24*  --   --  20 28* 36* 36*  CREATININE 1.05*  --   --  1.23* 1.43* 1.64* 1.53*  CALCIUM 8.5*  --   --  8.8* 9.0 9.0 8.9   < > = values in this interval not displayed.   Liver Function Tests: Recent Labs  Lab 10/05/19 2217  AST 19  ALT 15  ALKPHOS 61  BILITOT 0.5  PROT 6.8  ALBUMIN 3.7   Recent Labs  Lab 10/05/19 2229  LIPASE 23   No results for input(s): AMMONIA in the last 168 hours. CBC: Recent Labs  Lab 10/07/19 0031 10/07/19 0904 10/07/19 0913 10/08/19 0338 10/09/19 0330 10/10/19 0351 10/11/19 0424  WBC 9.8  --   --  7.9 7.0 7.3 6.7  HGB 9.4*   < > 10.5* 9.7* 10.4* 10.2* 9.8*  HCT 29.2*   < > 31.0* 29.5* 31.5* 30.8* 29.5*  MCV 107.7*  --   --  106.9* 106.1* 105.5* 105.7*  PLT 144*  --   --  143* 123* 112* 137*   < > = values in this interval not displayed.   Cardiac Enzymes: No results for input(s): CKTOTAL, CKMB, CKMBINDEX, TROPONINI in the last 168 hours. BNP: Invalid input(s): POCBNP CBG: Recent Labs  Lab 10/05/19 2247  GLUCAP  99   D-Dimer No results for input(s): DDIMER in the last 72  hours. Hgb A1c No results for input(s): HGBA1C in the last 72 hours. Lipid Profile No results for input(s): CHOL, HDL, LDLCALC, TRIG, CHOLHDL, LDLDIRECT in the last 72 hours. Thyroid function studies Recent Labs    10/09/19 1122  TSH 4.157   Anemia work up Recent Labs    10/08/19 0952  VITAMINB12 331  FOLATE 62.0   Urinalysis    Component Value Date/Time   COLORURINE YELLOW 07/12/2016 1649   APPEARANCEUR HAZY (A) 07/12/2016 1649   LABSPEC 1.017 07/12/2016 1649   PHURINE 6.0 07/12/2016 1649   GLUCOSEU NEGATIVE 07/12/2016 1649   GLUCOSEU NEGATIVE 10/30/2014 1208   HGBUR SMALL (A) 07/12/2016 1649   BILIRUBINUR Neg 07/23/2017 1604   KETONESUR NEGATIVE 07/12/2016 1649   PROTEINUR 1+ 07/23/2017 1604   PROTEINUR NEGATIVE 07/12/2016 1649   UROBILINOGEN 0.2 07/23/2017 1604   UROBILINOGEN 0.2 10/30/2014 1208   NITRITE Neg 07/23/2017 1604   NITRITE NEGATIVE 07/12/2016 1649   LEUKOCYTESUR Large (3+) (A) 07/23/2017 1604   Sepsis Labs Invalid input(s): PROCALCITONIN,  WBC,  LACTICIDVEN Microbiology Recent Results (from the past 240 hour(s))  SARS Coronavirus 2 by RT PCR (hospital order, performed in [facility] hospital lab) Nasopharyngeal Nasopharyngeal Swab     Status: None   Collection Time: 10/06/19 12:27 AM   Specimen: Nasopharyngeal Swab  Result Value Ref Range Status   SARS Coronavirus 2 NEGATIVE NEGATIVE Final    Comment: (NOTE) SARS-CoV-2 target nucleic acids are NOT DETECTED.  The SARS-CoV-2 RNA is generally detectable in upper and lower respiratory specimens during the acute phase of infection. The lowest concentration of SARS-CoV-2 viral copies this assay can detect is 250 copies / mL. A negative result does not preclude SARS-CoV-2 infection and should not be used as the sole basis for treatment or other patient management decisions.  A negative result may occur with improper specimen collection / handling, submission of specimen other than nasopharyngeal swab,  presence of viral mutation(s) within the areas targeted by this assay, and inadequate number of viral copies (<250 copies / mL). A negative result must be combined with clinical observations, patient history, and epidemiological information.  Fact Sheet for Patients:   StrictlyIdeas.no  Fact Sheet for Healthcare Providers: BankingDealers.co.za  This test is not yet approved or  cleared by the [place] FDA and has been authorized for detection and/or diagnosis of SARS-CoV-2 by FDA under an Emergency Use Authorization (EUA).  This EUA will remain in effect (meaning this test can be used) for the duration of the COVID-19 declaration under Section 564(b)(1) of the Act, 21 U.S.C. section 360bbb-3(b)(1), unless the authorization is terminated or revoked sooner.  Performed at [facility], [place] [address], [place], [place] [address]   MRSA PCR Screening     Status: None   Collection Time: 10/06/19  5:43 AM   Specimen: Nasopharyngeal  Result Value Ref Range Status   MRSA by PCR NEGATIVE NEGATIVE Final    Comment:        The GeneXpert MRSA Assay (FDA approved for NASAL specimens only), is one component of a comprehensive MRSA colonization surveillance program. It is not intended to diagnose MRSA infection nor to guide or monitor treatment for MRSA infections. Performed at [facility], [place] [address] [place],  [address]      Time coordinating discharge: 35 minutes  SIGNED:   Jonnie Finner, DO  [facility] 10/11/2019, 9:23  AM   If 7PM-7AM, please contact night-coverage [email or web address]

## 2019-10-11 NOTE — Care Management (Signed)
1316 10-11-19 Patient was discharged before Case Manager was able to speak with patient. Previous Case Manager received choice from son and it is [facility]. Case Manager called [facility] and start of care to begin within 24-48 hours. Case Manager will call the patient at home to make them aware of services. Bethena Roys, RN,BSN Case Manager

## 2019-10-11 NOTE — Progress Notes (Addendum)
Progress Note  Patient Name: Brittney Gates Date of Encounter: 10/11/2019  Primary Cardiologist: Sherren Mocha, MD   Subjective  No chest pain or SOB  Inpatient Medications    Scheduled Meds: . aspirin EC  81 mg Oral Daily  . chlorhexidine  15 mL Mouth Rinse BID  . levothyroxine  100 mcg Oral Q0600  . mouth rinse  15 mL Mouth Rinse q12n4p  . multivitamin with minerals  1 tablet Oral Daily  . pregabalin  50 mg Oral Daily  . sodium chloride flush  3 mL Intravenous Q12H  . zolpidem  5 mg Oral QHS,MR X 1   Continuous Infusions: . sodium chloride     PRN Meds: sodium chloride, acetaminophen, guaiFENesin-dextromethorphan, HYDROcodone-acetaminophen, levalbuterol, nitroGLYCERIN, ondansetron (ZOFRAN) IV, senna-docusate, sodium chloride flush, technetium albumin aggregated   Vital Signs    Vitals:   10/10/19 1652 10/10/19 2049 10/11/19 0450 10/11/19 0741  BP: 108/64 119/82 117/67 (!) 114/53  Pulse: 61 76  68  Resp: 20 19  17   Temp: 97.6 F (36.4 C) 98.2 F (36.8 C) 97.9 F (36.6 C) 97.9 F (36.6 C)  TempSrc: Oral Oral Oral Oral  SpO2: 100% 98%  96%  Weight:   77.6 kg   Height:        Intake/Output Summary (Last 24 hours) at 10/11/2019 0909 Last data filed at 10/11/2019 0451 Gross per 24 hour  Intake 120 ml  Output 450 ml  Net -330 ml   Filed Weights   10/09/19 0602 10/10/19 0506 10/11/19 0450  Weight: 76.7 kg 76.7 kg 77.6 kg    Telemetry    NSR 10/11/2019   ECG    No new EKG to review  Physical Exam   GEN: Well nourished, well developed in no acute distress HEENT: Normal NECK: No JVD; No carotid bruits LYMPHATICS: No lymphadenopathy CARDIAC:RRR, no murmurs, rubs, gallops RESPIRATORY:  Clear to auscultation without rales, wheezing or rhonchi  ABDOMEN: Soft, non-tender, non-distended MUSCULOSKELETAL:  No edema; No deformity  SKIN: Warm and dry NEUROLOGIC:  Alert and oriented x 3 PSYCHIATRIC:  Normal affect    Labs    Chemistry Recent Labs  Lab  10/05/19 2217 10/06/19 0448 10/09/19 0330 10/10/19 0351 10/11/19 0424  NA 142   < > 141 139 138  K 4.1   < > 3.5 4.0 4.1  CL 109   < > 102 101 99  CO2 22   < > 28 29 28   GLUCOSE 134*   < > 109* 109* 103*  BUN 29*   < > 28* 36* 36*  CREATININE 1.52*   < > 1.43* 1.64* 1.53*  CALCIUM 8.8*   < > 9.0 9.0 8.9  PROT 6.8  --   --   --   --   ALBUMIN 3.7  --   --   --   --   AST 19  --   --   --   --   ALT 15  --   --   --   --   ALKPHOS 61  --   --   --   --   BILITOT 0.5  --   --   --   --   GFRNONAA 29*   < > 31* 27* 29*  GFRAA 34*   < > 36* 31* 34*  ANIONGAP 11   < > 11 9 11    < > = values in this interval not displayed.     Hematology Recent Labs  Lab 10/09/19 0330 10/10/19 0351 10/11/19 0424  WBC 7.0 7.3 6.7  RBC 2.97* 2.92* 2.79*  HGB 10.4* 10.2* 9.8*  HCT 31.5* 30.8* 29.5*  MCV 106.1* 105.5* 105.7*  MCH 35.0* 34.9* 35.1*  MCHC 33.0 33.1 33.2  RDW 20.4* 20.0* 20.2*  PLT 123* 112* 137*    Cardiac EnzymesNo results for input(s): TROPONINI in the last 168 hours. No results for input(s): TROPIPOC in the last 168 hours.   BNP Recent Labs  Lab 10/05/19 2254 10/07/19 0031  BNP 214.9* 344.0*     DDimer  Recent Labs  Lab 10/06/19 0448  DDIMER 1.47*     Radiology    No results found. Patient Profile     Brittney Gates is a 86 y/o female with a PMHx of HTN, HLD, PAD, moderate aortic stenosis, hypothyroidism,. CKD Stage 3 who is currently admitted for chest pain evaluation.   Assessment & Plan    1. CHF:    -TTE on admission showed reduced EF of 30-35% with global hypokinesis of the left ventricle.  -EF down disproportionate  to CAD   -she put out 450cc yesterday and net neg 2.4L -PCWP at cath 26 mmHg -does not appear volume overloaded on exam today -creatinine bumped yesterday from 1.43 to 1.64 but today has improved to 1.53>>likely related to recent contrast exposure -will start lasix but only 20mg  daily due to soft BP and advanced age -check BMET in  1 week -encouraged her son to weigh her daily and call if weight increased >3lbs in 1 day of 5lbs in 1 week  2. CAD:  -Medical Rx for calcified 80% proximal RCA  -If refractory angina can consider high risk atherectomy  -continue ASA 81mg  daily -statin intolerant  3. Aortic Stenosis:  -moderate by cath given age even TAVR not likely a good choice   4. AoCKD3:  -creatinine increased today to 1.64 from 1.43 likely post contrast and diuresis>>this am improved to 1.53  5. HTN:  -BP stable at 114/53mmHg -continue to hold Lotrel and restart as outpt when creatinine normalizes  [facility] will sign off.   Medication Recommendations:  ASA 81mg  daily, Lasix 20mg  daily Other recommendations (labs, testing, etc):  BMET in 1 week Follow up as an outpatient:  followup with Dr. Burt Knack on 7/21  For questions or updates, please contact [place] Please consult [email or web address] for contact info under Cardiology/STEMI.      Signed, Fransico Him, MD  10/11/2019, 9:09 AM

## 2019-10-11 NOTE — Telephone Encounter (Signed)
STAT if HR is under 50 or over 120 (normal HR is 60-100 beats per minute)  1) What is your heart rate? 56  2) Do you have a log of your heart rate readings (document readings)?  In the hospital it was 130, 104. But now at home, on an oximeter, it is 56. Brittney Gates states it has been in the 57's since getting home. Brittney Gates states he is leaving the oximeter on her finger.   3) Do you have any other symptoms? No other symptoms

## 2019-10-11 NOTE — Telephone Encounter (Signed)
Agree. thanks

## 2019-10-11 NOTE — Telephone Encounter (Signed)
Spoke with the patient's son who states that his mother was discharged from the hospital today. He states that her HR has been running low. It dropped down to 44 at one point but has been averaging around 60. Current heart rate is 64 and recent blood pressures 117/49 and 120/48. Patient denies any symptoms of dizziness, weakness, fatigue or lightheadedness. Her O2 is 95%.  Patient's Lotrel was discontinued at discharge.  Patient has follow up scheduled with Dr. Burt Knack on 07/21. Advised patient and son to continue monitoring and to call back if the patient develops any symptoms.

## 2019-10-12 ENCOUNTER — Telehealth: Payer: Self-pay | Admitting: Cardiology

## 2019-10-12 NOTE — Telephone Encounter (Signed)
[name] [name] called she reports that her heart rate has been in the 50s on her pulse oximeter all day.  It has not been in the 40s since he called Dr. Antionette Char office yesterday.  Based heart rate he is reported has been 68.  Blood pressure is 109/49 and she is feeling well without lightheadedness, chest pain, or dyspnea.  I recommended that baseline heart rate in the 50s is acceptable blood pressure is normal and she is otherwise feeling at baseline state of health.  I recommended that they return to the emergency department if she develops symptoms of lightheadedness, chest pain, or dyspnea.  She has a follow-up with Dr. Burt Knack on 7/21 and they will keep that appointment.  Teresa Pelton, MD

## 2019-10-13 ENCOUNTER — Telehealth: Payer: Self-pay | Admitting: *Deleted

## 2019-10-13 NOTE — Telephone Encounter (Signed)
Pt was on TCM report admitted 10/05/19 for (L) side chest pressure along w/SOB sxs. TTE on admission showed reduced EF of 30-35% with global hypokinesis of the left ventricle.  LHC did not show evidence of significant stenosis to explain new EF reduction. EKG showed lateral ST depression with T wave inversion. Mild troponin elevation that flattened. LHC showed evidence of Proximal RCA lesion with 80% stenosis. Intervention unable to be pursued given severe calcification. Pt D/C 10/11/19, and will follow-up w/cardiology 10/19/19...Johny Chess

## 2019-10-19 ENCOUNTER — Other Ambulatory Visit: Payer: Self-pay

## 2019-10-19 ENCOUNTER — Encounter: Payer: Self-pay | Admitting: Cardiovascular Disease

## 2019-10-19 ENCOUNTER — Ambulatory Visit (INDEPENDENT_AMBULATORY_CARE_PROVIDER_SITE_OTHER): Payer: Medicare Other | Admitting: Cardiovascular Disease

## 2019-10-19 VITALS — BP 130/72 | HR 77 | Ht 67.0 in | Wt 175.0 lb

## 2019-10-19 DIAGNOSIS — I5032 Chronic diastolic (congestive) heart failure: Secondary | ICD-10-CM | POA: Diagnosis not present

## 2019-10-19 DIAGNOSIS — I10 Essential (primary) hypertension: Secondary | ICD-10-CM

## 2019-10-19 DIAGNOSIS — I350 Nonrheumatic aortic (valve) stenosis: Secondary | ICD-10-CM

## 2019-10-19 DIAGNOSIS — I25119 Atherosclerotic heart disease of native coronary artery with unspecified angina pectoris: Secondary | ICD-10-CM

## 2019-10-19 NOTE — Progress Notes (Signed)
Cardiology Office Note:    Date:  10/21/2019   ID:  KYNNADI DICENSO, DOB 24-Jul-1924, MRN [id number]  PCP:  [name], MD  [facility] Cardiologist:  Sherren Mocha, MD  [place] Electrophysiologist:  None   Referring MD: [name], *   Chief Complaint  Patient presents with  . Chest Pain    History of Present Illness:    Brittney Gates is a 86 y.o. female with a hx of moderate aortic stenosis, chronic low back pain, and peripheral arterial disease, presenting for hospital follow-up evaluation after she was recently admitted with unstable angina and acute systolic heart failure.  She was treated with IV diuretic therapy.  After careful discussion in the setting of her advanced age, she was referred for cardiac catheterization.  This demonstrated moderate nonobstructive stenosis of the LAD, patency of the left circumflex, and severe calcific stenosis of the mid RCA.  It was felt that medical therapy was appropriate as a first-line treatment.  She presents today for hospital follow-up evaluation.  The patient is here with her son today.  She continues to have intermittent chest discomfort but this is really not changed from her longstanding symptoms.  She feels a heaviness in her chest at times.  Her breathing is improved.  She denies orthopnea or PND.  She has a lot of questions about the findings from her hospitalization and cardiac studies.  Past Medical History:  Diagnosis Date  . Acute pancreatitis   . ANEMIA   . ANXIETY   . Aortic stenosis    Echo 06/2019: EF 55-60, no RWMA, mild LVH, normal RV SF, moderate LAE, mild MR, moderate aortic stenosis (mean 20.3 mmHg), mild dilation of aortic root (40 mm)  . Arthritis   . Chronic anemia   . Diverticulosis   . Gastritis   . Hemorrhoids   . Hiatal hernia   . HYPERLIPIDEMIA   . HYPERTENSION   . HYPOTHYROIDISM   . LOW BACK PAIN, CHRONIC   . PAD (peripheral artery disease) ([place])     Past Surgical History:   Procedure Laterality Date  . ANGIOPLASTY  1995  . APPENDECTOMY    . BACK SURGERY     x's 2  . BREAST SURGERY     Benign  . FEMUR IM NAIL  01/21/2012   Procedure: INTRAMEDULLARY (IM) NAIL FEMORAL;  Surgeon: Newt Minion, MD;  Location: [place];  Service: Orthopedics;  Laterality: Left;  Intertrochanteric Nail Left Hip  . HEMORRHOID SURGERY    . IR GENERIC HISTORICAL  05/29/2016   IR SACROPLASTY BILATERAL 05/29/2016 [name], MD [facility]  . IR GENERIC HISTORICAL  06/19/2016   IR RADIOLOGIST EVAL & MGMT 06/19/2016 [facility]  . RIGHT/LEFT HEART CATH AND CORONARY ANGIOGRAPHY N/A 10/07/2019   Procedure: RIGHT/LEFT HEART CATH AND CORONARY ANGIOGRAPHY;  Surgeon: Jettie Booze, MD;  Location: [place] [facility];  Service: Cardiovascular;  Laterality: N/A;  . TUBAL LIGATION      Current Medications: Current Meds  Medication Sig  . acetaminophen (TYLENOL) 325 MG tablet Take 325 mg by mouth every 6 (six) hours as needed for mild pain, moderate pain or headache.  Marland Kitchen aspirin EC 81 MG tablet Take 81 mg by mouth daily.  . Cholecalciferol 2000 UNITS TABS Take 2,000 Units by mouth daily.  . furosemide (LASIX) 20 MG tablet Take 1 tablet (20 mg total) by mouth daily.  Marland Kitchen HYDROcodone-acetaminophen (NORCO/VICODIN) 5-325 MG tablet Take 1 tablet by mouth every  4 (four) hours as needed for moderate pain. (Patient taking differently: Take 0.5-1 tablets by mouth every 4 (four) hours as needed for moderate pain. )  . levothyroxine (SYNTHROID) 100 MCG tablet TAKE ONE TABLET BY MOUTH EVERY MORNING BEFORE BREAKFAST (Patient taking differently: Take 100 mcg by mouth daily before breakfast. )  . Multiple Vitamin (MULTIVITAMIN WITH MINERALS) TABS tablet Take 1 tablet by mouth daily.  . nitroGLYCERIN (NITROSTAT) 0.4 MG SL tablet Place 1 tablet (0.4 mg total) under the tongue every 5 (five) minutes as needed for up to 30 doses for chest pain.  [name] 3-6-9 Fatty Acids (TRIPLE OMEGA-3-6-9 PO) Take 1  capsule by mouth daily.  . pregabalin (LYRICA) 50 MG capsule Take 1 capsule (50 mg total) by mouth daily.  . sennosides-docusate sodium (SENOKOT-S) 8.6-50 MG tablet Take 2 tablets by mouth daily as needed for constipation.   Marland Kitchen zolpidem (AMBIEN) 10 MG tablet TAKE ONE TABLET BY MOUTH EVERY NIGHT AT BEDTIME AS NEEDED FOR SLEEP (Patient taking differently: Take 10 mg by mouth at bedtime. )     Allergies:   Statins, Lidocaine, and Metamucil [psyllium]   Social History   Socioeconomic History  . Marital status: Widowed    Spouse name: Not on file  . Number of children: 4  . Years of education: Not on file  . Highest education level: Not on file  Occupational History  . Occupation: retired  Tobacco Use  . Smoking status: Never Smoker  . Smokeless tobacco: Never Used  Vaping Use  . Vaping Use: Never used  Substance and Sexual Activity  . Alcohol use: No    Alcohol/week: 0.0 standard drinks  . Drug use: No  . Sexual activity: Not on file  Other Topics Concern  . 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 Difficulty of Paying Living Expenses:   Food Insecurity:   . Worried About Charity fundraiser in the Last Year:   . Arboriculturist in the Last Year:   Transportation Needs:   . Film/video editor (Medical):   Marland Kitchen Lack of Transportation (Non-Medical):   Physical Activity:   . Days of Exercise per Week:   . Minutes of Exercise per Session:   Stress:   . Feeling of Stress :   Social Connections:   . Frequency of Communication with Friends and Family:   . Frequency of Social Gatherings with Friends and Family:   . Attends Religious Services:   . Active Member of Clubs or Organizations:   . Attends Archivist  Meetings:   Marland Kitchen Marital Status:      Family History: The patient's family history includes Gallbladder disease in her daughter and son; Lung cancer in her father. There is no history of Colon cancer.  ROS:   Please see the history of present illness.    All other systems reviewed and are negative.  EKGs/Labs/Other Studies Reviewed:    The following studies were reviewed today: Cardiac Cath 10-07-2019: Conclusion    Prox RCA lesion is 80% stenosed. Mid RCA lesion is 80% stenosed. Heavily calcified disease that would require atherectomy for PCI.  Mid LAD lesion is 50% stenosed.  There is moderate  left ventricular systolic dysfunction.  The left ventricular ejection fraction is 25-35% by visual estimate.  LV end diastolic pressure is mildly elevated.  There is moderate aortic valve stenosis. Mean gradient 18 mm Hg. Valve area 1.5 cm2  Ao sat 89% on RA, PA sat 64% on RA; PA pressure 53/23, mean PA pressure 38 mm Hg; mean PCWP 26 mm Hg; CO 7.6 L.min; CI 4.1   Continue with management of LV dysfunction, which seems out of proportion to CAD.  Will watch at [facility] for continued diuresis and medicine titration.   If she has refractory angina, could consider high risk PCI of RCA with atherectomy.    Findings discussed with the son, Idolina Mantell.  Fick Cardiac Output 7.68 L/min  Fick Cardiac Output Index 4.16 (L/min)/BSA  Aortic Mean Gradient 18.4 mmHg  Aortic Peak Gradient 20 mmHg  Aortic Valve Area 1.59  Aortic Value Area Index 0.86 cm2/BSA  RA A Wave 11 mmHg  RA V Wave 9 mmHg  RA Mean 9 mmHg  RV Systolic Pressure 49 mmHg  RV Diastolic Pressure 7 mmHg  RV EDP 11 mmHg  PA Systolic Pressure 53 mmHg  PA Diastolic Pressure 23 mmHg  PA Mean 38 mmHg  PW A Wave 22 mmHg  PW V Wave 38 mmHg  PW Mean 25 mmHg  AO Systolic Pressure 732 mmHg  AO Diastolic Pressure 49 mmHg  AO Mean 78 mmHg  LV Systolic Pressure 202 mmHg  LV Diastolic Pressure 11 mmHg  LV EDP 18 mmHg  AOp Systolic  Pressure 542 mmHg  AOp Diastolic Pressure 55 mmHg  AOp Mean Pressure 78 mmHg  LVp Systolic Pressure 706 mmHg  LVp Diastolic Pressure 13 mmHg  LVp EDP Pressure 21 mmHg  QP/QS 1  TPVR Index 9.13 HRUI  TSVR Index 18.74 HRUI  PVR SVR Ratio 0.17  TPVR/TSVR Ratio 0.49   Diagnostic Dominance: Right Left Anterior Descending  Mid LAD lesion is 50% stenosed.  Right Coronary Artery  Prox RCA lesion is 80% stenosed. The lesion is severely calcified.  Mid RCA lesion is 80% stenosed. The lesion is type C. The lesion is severely calcified.  Intervention  No interventions have been documented. Right Heart  Right Heart Pressures Ao sat 89% on RA, PA sat 64% on RA; PA pressure 53/23, mean PA pressure 38 mm Hg; mean PCWP 26 mm Hg; CO 7.6 L.min; CI 4.1    EKG:  EKG is ordered today.  The ekg ordered today demonstrates normal sinus rhythm 77 bpm, nonspecific IVCD, ST and T wave abnormality consider lateral ischemia.  Recent Labs: 10/05/2019: ALT 15 10/07/2019: B Natriuretic Peptide 344.0 10/09/2019: TSH 4.157 10/11/2019: BUN 36; Creatinine, Ser 1.53; Hemoglobin 9.8; Platelets 137; Potassium 4.1; Sodium 138  Recent Lipid Panel    Component Value Date/Time   CHOL 214 (H) 09/09/2019 1451   TRIG 141.0 09/09/2019 1451   TRIG 120 03/29/2010 0000   HDL 51.00 09/09/2019 1451   CHOLHDL 4 09/09/2019 1451   VLDL 28.2 09/09/2019 1451   LDLCALC 135 (H) 09/09/2019 1451   LDLDIRECT 143.1 12/08/2012 1223    Physical Exam:    VS:  BP 130/72   Pulse 77   Ht 5\' 7"  (1.702 m)   Wt 175 lb (79.4 kg)   SpO2 96%   BMI 27.41 kg/m     Wt Readings from Last 3 Encounters:  10/19/19 175 lb (79.4 kg)  10/11/19 171 lb 1.2 oz (77.6 kg)  09/09/19 174 lb (78.9 kg)  GEN: Elderly woman in no acute distress HEENT: Normal NECK: No JVD; bilateral carotid bruits LYMPHATICS: No lymphadenopathy CARDIAC: RRR, 2/6 harsh mid peaking systolic murmur at the right upper sternal border, A2 present RESPIRATORY:  Clear to  auscultation without rales, wheezing or rhonchi  ABDOMEN: Soft, non-tender, non-distended MUSCULOSKELETAL:  No edema; No deformity  SKIN: Warm and dry NEUROLOGIC:  Alert and oriented x 3 PSYCHIATRIC:  Normal affect   ASSESSMENT:    1. Nonrheumatic aortic valve stenosis   2. Coronary artery disease involving native coronary artery of native heart with angina pectoris ([place])   3. Essential hypertension   4. Chronic diastolic heart failure (HCC)    PLAN:    In order of problems listed above:  1. The patient's recent echo and cardiac catheterization findings are reviewed.  Difficult to know what precipitated her recent hospitalization.  I do not appreciate a lesion that appears to be a plaque rupture type of event.  Her coronary arteries are calcified and while she clearly has significant stenosis of the RCA, it would not be expected to cause rest pain.  I wonder if she had an acute stress cardiomyopathy such as a Takotsubo event.  There was a question of whether she has severe low-flow low gradient aortic stenosis in the setting of her severe LV dysfunction.  Mean gradient by cardiac catheterization was 18 mmHg.  Her exam and overall findings I think suggest moderate aortic stenosis and this was found on her echo study earlier this year when her LV function was normal.  Recommend ongoing surveillance.  She should have a repeat echocardiogram within 6 months. 2. I reviewed the patient's cardiac catheterization films with her today.  I agree that medical therapy is most appropriate.  She could undergo atherectomy and stenting of the mid RCA.  She does not have any other flow obstructive disease.  Considering her very advanced age, I think a conservative approach is most appropriate.  She continues on aspirin for antiplatelet therapy. 3. Blood pressure controlled 4. Continue furosemide at current dose.   Medication Adjustments/Labs and Tests Ordered: Current medicines are reviewed at length with  the patient today.  Concerns regarding medicines are outlined above.  Orders Placed This Encounter  Procedures  . EKG 12-Lead  . ECHOCARDIOGRAM COMPLETE   No orders of the defined types were placed in this encounter.   Patient Instructions  Medication Instructions:  Your provider recommends that you continue on your current medications as directed. Please refer to the Current Medication list given to you today.   *If you need a refill on your cardiac medications before your next appointment, please call your pharmacy*  Testing/Procedures: Your provider has requested that you have an echocardiogram. Echocardiography is a painless test that uses sound waves to create images of your heart. It provides your doctor with information about the size and shape of your heart and how well your heart's chambers and valves are working. This procedure takes approximately one hour. There are no restrictions for this procedure.  Follow-Up: Your provider recommends that you schedule a follow-up appointment in 2 months with Richardson Dopp, PA.     Signed, Sherren Mocha, MD  10/21/2019 1:39 PM    [facility]

## 2019-10-19 NOTE — Patient Instructions (Signed)
Medication Instructions:  Your provider recommends that you continue on your current medications as directed. Please refer to the Current Medication list given to you today.   *If you need a refill on your cardiac medications before your next appointment, please call your pharmacy*  Testing/Procedures: Your provider has requested that you have an echocardiogram. Echocardiography is a painless test that uses sound waves to create images of your heart. It provides your doctor with information about the size and shape of your heart and how well your heart's chambers and valves are working. This procedure takes approximately one hour. There are no restrictions for this procedure.  Follow-Up: Your provider recommends that you schedule a follow-up appointment in 2 months with Richardson Dopp, PA.

## 2019-10-21 ENCOUNTER — Telehealth: Payer: Self-pay | Admitting: Cardiovascular Disease

## 2019-10-21 ENCOUNTER — Encounter: Payer: Self-pay | Admitting: Cardiovascular Disease

## 2019-10-21 NOTE — Telephone Encounter (Signed)
Patient's son states that at his mothers appt on 10/19/19 Dr. Burt Knack had mentioned wanting to get some blood work done. There are no orders in - please advise.

## 2019-10-21 NOTE — Telephone Encounter (Signed)
Advised son that Dr. Antionette Char note is unfinished and not sure what blood work MD is requesting (think its a BMET).  Aware that Valetta Fuller, RN will follow up with him about this when she returns to office.  Aware it will be end of next week before hearing back from her.  He also would like to know from Dr. Burt Knack if Lasix is the only medication they are treating mom with..... that she doesn't need any other medication for her HF? Aware that this answer may be week of 8/2 before getting recommendation from Dr. Burt Knack as he is not in the office next week. Son is agreeable to plan.

## 2019-10-22 ENCOUNTER — Other Ambulatory Visit: Payer: Self-pay | Admitting: Internal Medicine

## 2019-10-24 NOTE — Telephone Encounter (Signed)
08/03/2019 Pregabalin 50 Mg Capsule 60#  Last ov 09/09/19 Next ov n/s

## 2019-10-26 ENCOUNTER — Other Ambulatory Visit: Payer: Self-pay | Admitting: Internal Medicine

## 2019-10-31 ENCOUNTER — Telehealth: Payer: Self-pay | Admitting: Cardiovascular Disease

## 2019-10-31 DIAGNOSIS — R001 Bradycardia, unspecified: Secondary | ICD-10-CM

## 2019-10-31 NOTE — Telephone Encounter (Signed)
STAT if HR is under 50 or over 120 (normal HR is 60-100 beats per minute)  1) What is your heart rate? 51  2) Do you have a log of your heart rate readings (document readings)? Patient's son states the patient has been averaging a 59 HR, however today HR decreased to 49-51         3)Do you have any other symptoms? Chest pain   Pt c/o of Chest Pain: STAT if CP now or developed within 24 hours  1. Are you having CP right now? No  2. Are you experiencing any other symptoms (ex. SOB, nausea, vomiting, sweating)? No  3. How long have you been experiencing CP? Mild CP, past 2 weeks  4. Is your CP continuous or coming and going? Coming and going  5. Have you taken Nitroglycerin? Yes, as needed ?

## 2019-10-31 NOTE — Telephone Encounter (Signed)
See 8/2 phone note.

## 2019-10-31 NOTE — Telephone Encounter (Signed)
Called to check on the patient. Spoke with her son.  He is concerned because the patient's HR is generally in the 50s-70s but lately has been in the upper 40s. She does not complain of dizziness or lightheadedness when it is low. She has taken NTG a few times but her HR is generally fine (upper 50s) after administration. He checks her HR while sleeping and it is generally higher (70s).  He would like to know from Dr. Burt Knack her low threshold HR to be concerned or bring her to the ER since she hasn't experienced symptoms.   The patient did not have her labs drawn at her last visit. He understands they will be called tomorrow with Dr. Antionette Char recommendations/thoughts.

## 2019-10-31 NOTE — Telephone Encounter (Signed)
Called patient's son back. Patient complaining about mild chest pain and having a low HR 50's. Patient's son stated her oxygen is good, BP 126/60, and patient is doing okay. Patient denies any SOB or dizziness, etc.  Patient's son stated patient's HR does go up to normal in the evenings. He stated her HR has been low since she was in the hospital. Informed patient's son this might be her new normal. Patient is not taking any cardiac medications to make her HR go low. Asked about Norco, patient's son stated she rarely takes it. Patient is eating a regular diet, breaking it up into small frequent meals, and patient is staying hydrated. Will forward to Dr. Burt Knack for advisement.

## 2019-11-01 ENCOUNTER — Encounter: Payer: Self-pay | Admitting: *Deleted

## 2019-11-01 NOTE — Progress Notes (Signed)
Patient ID: Brittney Gates, female   DOB: [birth year], 86 y.o.   MRN: [id number] Patient enrolled for [employer] to ship a 3 day ZIO XT long term to her home. Letter with instructions mailed to patient.

## 2019-11-01 NOTE — Telephone Encounter (Signed)
She had a 3 day Zio last year. I think with new bradycardia documented, we should repeat. thanks

## 2019-11-01 NOTE — Telephone Encounter (Signed)
Spoke with the patient's son, who is with the patient.  3 day Zio patch ordered for mailing.  Address and insurance confirmed. They were grateful for assistance.

## 2019-11-04 ENCOUNTER — Ambulatory Visit (INDEPENDENT_AMBULATORY_CARE_PROVIDER_SITE_OTHER): Payer: Medicare Other | Admitting: Otolaryngology

## 2019-11-05 ENCOUNTER — Ambulatory Visit (INDEPENDENT_AMBULATORY_CARE_PROVIDER_SITE_OTHER): Payer: Medicare Other

## 2019-11-05 DIAGNOSIS — R001 Bradycardia, unspecified: Secondary | ICD-10-CM

## 2019-11-08 ENCOUNTER — Ambulatory Visit (INDEPENDENT_AMBULATORY_CARE_PROVIDER_SITE_OTHER): Payer: Medicare Other | Admitting: Otolaryngology

## 2019-11-10 ENCOUNTER — Other Ambulatory Visit: Payer: Self-pay

## 2019-11-10 MED ORDER — FUROSEMIDE 20 MG PO TABS: 20.0000 mg | ORAL_TABLET | Freq: Every day | ORAL | 3 refills | Status: DC

## 2019-11-10 NOTE — Telephone Encounter (Signed)
Pt's medication was sent to pt's pharmacy as requested. Confirmation received.  °

## 2019-11-14 ENCOUNTER — Telehealth: Payer: Self-pay | Admitting: Cardiovascular Disease

## 2019-11-14 NOTE — Telephone Encounter (Signed)
John, son of the patient called. He wanted to let Dr. Burt Knack know that he had to call EMS for the patient last night. The patient had a coughing spell and started wheezing. The son sat the patient up and the coughing subsided but the patient still had a crackling sound in her lungs. The Son said he called the on call Cardiologist and was instructed to give the patient extra lasix. Once the patient voided the liquid the crackling in her lungs went away. It took some time for her HR to come back down but it came down eventually. The son was concerned because the last time this happened the patient was admitted to the hospital.  The son wanted to make Dr. Burt Knack aware of what happened last night. If the office would like to contact him do not hesitate to contact him

## 2019-11-15 DIAGNOSIS — Z20822 Contact with and (suspected) exposure to covid-19: Secondary | ICD-10-CM | POA: Diagnosis not present

## 2019-11-16 NOTE — Telephone Encounter (Signed)
Informed the patient's son (the patient was there as well) that she may take an extra Lasix if she gets similar symptoms.  They will call if symptoms do not improve with extra lasix or if she is having to take it more and more often. She feels fine today.  They were grateful for call and agree with plan.

## 2019-11-16 NOTE — Telephone Encounter (Signed)
Thx for letting me know. If this happens again, it is fine to take an extra furosemide pill when needed. thanks

## 2019-11-22 DIAGNOSIS — R001 Bradycardia, unspecified: Secondary | ICD-10-CM | POA: Diagnosis not present

## 2019-11-24 ENCOUNTER — Telehealth: Payer: Self-pay | Admitting: Cardiovascular Disease

## 2019-11-24 NOTE — Telephone Encounter (Signed)
Spoke with husband and made him aware that we are waiting on Dr. Burt Knack to review monitor.  Once he reviews, we will give them a call.  Husband appreciative for information.

## 2019-11-24 NOTE — Telephone Encounter (Signed)
Follow Up:    Husband is calling to see if pt's Monitor results are ready please?

## 2019-11-29 ENCOUNTER — Telehealth: Payer: Self-pay | Admitting: Medical

## 2019-11-29 DIAGNOSIS — I5032 Chronic diastolic (congestive) heart failure: Secondary | ICD-10-CM

## 2019-11-29 NOTE — Telephone Encounter (Signed)
Patient's son called to report patient woke up to go to the bathroom and noticed her breathing was heavy. Patient also had a coughing fit at which oxygen levels went down to 88% during the fit, but this later improved to 94-95%. The patient generally felt it was harder to breath after that. Also felt breathing was better sitting up. Patient also felt stomach was a little tight. Denied chest pain, palpitations, LLE, fever, chills. Son has called before and was instructed to give extra lasix when this happens. Patient takes lasix 20 mg daily. Denies known weight gain (pt uses walker and does not weight daily). The son gave patient extra 2 doses (60mg ) total around 5:30AM. Since then breathing improving and coughing resolved. I advised son to not give more lasix at this time. I will let Dr. Burt Knack know who can call with further instructions. Also advised if symptoms worsened or did not improve can call 911 or bring in for Ed eval.   Cadence Kathlen Mody, PA-C

## 2019-11-29 NOTE — Telephone Encounter (Signed)
Since this has happened several times, it may be best to increase daily lasix dose to 40 mg with 10 meq KDur. Check BMET in 2-3 weeks. thanks

## 2019-11-30 ENCOUNTER — Encounter: Payer: Self-pay | Admitting: Internal Medicine

## 2019-11-30 ENCOUNTER — Encounter: Payer: Self-pay | Admitting: Cardiovascular Disease

## 2019-11-30 MED ORDER — FUROSEMIDE 40 MG PO TABS: 40.0000 mg | ORAL_TABLET | Freq: Every day | ORAL | 3 refills | Status: DC

## 2019-11-30 MED ORDER — POTASSIUM CHLORIDE ER 10 MEQ PO TBCR
10.0000 meq | EXTENDED_RELEASE_TABLET | Freq: Every day | ORAL | 3 refills | Status: DC
Start: 2019-11-30 — End: 2019-12-15

## 2019-11-30 NOTE — Telephone Encounter (Signed)
This encounter was created in error - please disregard.

## 2019-11-30 NOTE — Telephone Encounter (Signed)
Spoke to both the patient and her son.  She will increase Lasix to 40 mg daily and start Kdur 10 meq daily. She will have BMET drawn when she comes for echo later

## 2019-11-30 NOTE — Addendum Note (Signed)
Addended by: Harland German A on: 11/30/2019 11:20 AM   Modules accepted: Orders

## 2019-11-30 NOTE — Telephone Encounter (Signed)
    Pt's son calling, he said he is returning call from [place]

## 2019-12-02 ENCOUNTER — Encounter (INDEPENDENT_AMBULATORY_CARE_PROVIDER_SITE_OTHER): Payer: Self-pay | Admitting: Otolaryngology

## 2019-12-02 ENCOUNTER — Other Ambulatory Visit: Payer: Self-pay

## 2019-12-02 ENCOUNTER — Ambulatory Visit (INDEPENDENT_AMBULATORY_CARE_PROVIDER_SITE_OTHER): Payer: Medicare Other | Admitting: Otolaryngology

## 2019-12-02 VITALS — Temp 97.2°F

## 2019-12-02 DIAGNOSIS — R490 Dysphonia: Secondary | ICD-10-CM

## 2019-12-02 DIAGNOSIS — H6123 Impacted cerumen, bilateral: Secondary | ICD-10-CM

## 2019-12-02 DIAGNOSIS — I25119 Atherosclerotic heart disease of native coronary artery with unspecified angina pectoris: Secondary | ICD-10-CM | POA: Diagnosis not present

## 2019-12-02 DIAGNOSIS — Z23 Encounter for immunization: Secondary | ICD-10-CM | POA: Diagnosis not present

## 2019-12-02 NOTE — Progress Notes (Signed)
HPI: Brittney Gates is a 86 y.o. female who returns today for evaluation of wax buildup in her ears.  She also complains about her voice being hoarse although she sounds reasonably good in the office today.  She does have senile changes of her voice..  Past Medical History:  Diagnosis Date  . Acute pancreatitis   . ANEMIA   . ANXIETY   . Aortic stenosis    Echo 06/2019: EF 55-60, no RWMA, mild LVH, normal RV SF, moderate LAE, mild MR, moderate aortic stenosis (mean 20.3 mmHg), mild dilation of aortic root (40 mm)  . Arthritis   . Chronic anemia   . Diverticulosis   . Gastritis   . Hemorrhoids   . Hiatal hernia   . HYPERLIPIDEMIA   . HYPERTENSION   . HYPOTHYROIDISM   . LOW BACK PAIN, CHRONIC   . PAD (peripheral artery disease) ([place])    Past Surgical History:  Procedure Laterality Date  . ANGIOPLASTY  1995  . APPENDECTOMY    . BACK SURGERY     x's 2  . BREAST SURGERY     Benign  . FEMUR IM NAIL  01/21/2012   Procedure: INTRAMEDULLARY (IM) NAIL FEMORAL;  Surgeon: Newt Minion, MD;  Location: [facility];  Service: Orthopedics;  Laterality: Left;  Intertrochanteric Nail Left Hip  . HEMORRHOID SURGERY    . IR GENERIC HISTORICAL  05/29/2016   IR SACROPLASTY BILATERAL 05/29/2016 [name], MD [facility]  . IR GENERIC HISTORICAL  06/19/2016   IR RADIOLOGIST EVAL & MGMT 06/19/2016 [facility]  . RIGHT/LEFT HEART CATH AND CORONARY ANGIOGRAPHY N/A 10/07/2019   Procedure: RIGHT/LEFT HEART CATH AND CORONARY ANGIOGRAPHY;  Surgeon: Jettie Booze, MD;  Location: [facility];  Service: Cardiovascular;  Laterality: N/A;  . TUBAL LIGATION     Social History   Socioeconomic History  . Marital status: Widowed    Spouse name: Not on file  . Number of children: 4  . Years of education: Not on file  . Highest education level: Not on file  Occupational History  . Occupation: retired  Tobacco Use  . Smoking status: Never Smoker  . Smokeless tobacco: Never Used  Vaping Use  .  Vaping Use: Never used  Substance and Sexual Activity  . Alcohol use: No    Alcohol/week: 0.0 standard drinks  . Drug use: No  . Sexual activity: Not on file  Other Topics Concern  . 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 Difficulty of Paying Living Expenses: Not on file  Food Insecurity:   . Worried About Charity fundraiser in the Last Year: Not on file  . Ran Out of Food in the Last Year: Not on file  Transportation Needs:   . Lack of Transportation (Medical): Not on file  . Lack of Transportation (Non-Medical): Not on file  Physical Activity:   . Days of Exercise per Week: Not on file  . Minutes of Exercise per Session: Not on file  Stress:   . Feeling of Stress : Not on file  Social Connections:   . Frequency of Communication with Friends and Family: Not on file  . Frequency of Social Gatherings  with Friends and Family: Not on file  . Attends Religious Services: Not on file  . Active Member of Clubs or Organizations: Not on file  . Attends Archivist Meetings: Not on file  . Marital Status: Not on file   Family History  Problem Relation Age of Onset  . Lung cancer Father   . Gallbladder disease Son   . Gallbladder disease Daughter   . Colon cancer Neg Hx    Allergies  Allergen Reactions  . Statins Other (See Comments)    Leg weakness  . Lidocaine     Per patient  . Metamucil [Psyllium]     Difficulty swallowing after taking   Prior to Admission medications   Medication Sig Start Date End Date Taking? Authorizing Provider  acetaminophen (TYLENOL) 325 MG tablet Take 325 mg by mouth every 6 (six) hours as needed for mild pain, moderate pain or headache.   Yes [provider]  aspirin EC 81  MG tablet Take 81 mg by mouth daily.   Yes [provider]  Cholecalciferol 2000 UNITS TABS Take 2,000 Units by mouth daily.   Yes [provider]  furosemide (LASIX) 40 MG tablet Take 1 tablet (40 mg total) by mouth daily. 11/30/19 11/24/20 Yes Sherren Mocha, MD  HYDROcodone-acetaminophen (NORCO/VICODIN) 5-325 MG tablet Take 1 tablet by mouth every 4 (four) hours as needed for moderate pain. Patient taking differently: Take 0.5-1 tablets by mouth every 4 (four) hours as needed for moderate pain.  09/09/19  Yes [name], MD  levothyroxine (SYNTHROID) 100 MCG tablet TAKE ONE TABLET BY MOUTH EVERY MORNING BEFORE BREAKFAST Patient taking differently: Take 100 mcg by mouth daily before breakfast.  09/30/19  Yes [name], MD  Multiple Vitamin (MULTIVITAMIN WITH MINERALS) TABS tablet Take 1 tablet by mouth daily.   Yes [provider]  nitroGLYCERIN (NITROSTAT) 0.4 MG SL tablet Place 1 tablet (0.4 mg total) under the tongue every 5 (five) minutes as needed for up to 30 doses for chest pain. 10/11/19  Yes Kyle, Tyrone A, DO  Omega 3-6-9 Fatty Acids (TRIPLE OMEGA-3-6-9 PO) Take 1 capsule by mouth daily.   Yes [provider]  potassium chloride (KLOR-CON) 10 MEQ tablet Take 1 tablet (10 mEq total) by mouth daily. 11/30/19 11/24/20 Yes Sherren Mocha, MD  pregabalin (LYRICA) 50 MG capsule TAKE ONE CAPSULE BY MOUTH TWICE A DAY 10/25/19  Yes [name], MD  sennosides-docusate sodium (SENOKOT-S) 8.6-50 MG tablet Take 2 tablets by mouth daily as needed for constipation.    Yes [provider]  zolpidem (AMBIEN) 10 MG tablet TAKE ONE TABLET BY MOUTH EVERY NIGHT AT BEDTIME AS NEEDED FOR SLEEP 10/27/19  Yes [name], MD     Positive ROS: Otherwise negative  All other systems have been reviewed and were otherwise negative with the exception of those mentioned in the HPI and as above.  Physical Exam: Constitutional: Alert,  well-appearing, no acute distress Ears: External ears without lesions or tenderness.  She had moderate wax buildup in both ear canals that was cleaned with forceps and curettes.  TMs were otherwise clear.  No ear canal abnormality noted. Nasal: External nose without lesions.. Clear nasal passages bilaterally. Oral: Lips and gums without lesions. Tongue and palate mucosa without lesions. Posterior oropharynx clear. Indirect laryngoscopy revealed a clear base of tongue.  Epiglottis was normal.  Vocal cords were clear bilaterally with normal vocal mobility bilaterally. Neck: No palpable adenopathy or masses Respiratory: Breathing  comfortably  Skin: No facial/neck lesions or rash noted.  Cerumen impaction removal  Date/Time: 12/02/2019 2:50 PM Performed by: Rozetta Nunnery, MD Authorized by: Rozetta Nunnery, MD   Consent:    Consent obtained:  Verbal   Consent given by:  Patient   Risks discussed:  Pain and bleeding Procedure details:    Location:  L ear and R ear   Procedure type: curette and forceps   Post-procedure details:    Inspection:  TM intact and canal normal   Hearing quality:  Improved   Patient tolerance of procedure:  Tolerated well, no immediate complications Comments:     TMs are clear bilaterally.    Assessment: Ear canals were cleaned in the office today. Hoarseness is secondary to senile changes and some supraglottic mucus but no vocal cord abnormality noted on indirect laryngoscopy.  Plan: Reassured her of normal laryngeal examination. Ear canals were cleaned.   Radene Journey, MD

## 2019-12-07 ENCOUNTER — Other Ambulatory Visit: Payer: Self-pay

## 2019-12-07 ENCOUNTER — Ambulatory Visit (INDEPENDENT_AMBULATORY_CARE_PROVIDER_SITE_OTHER): Payer: Medicare Other | Admitting: Dermatology

## 2019-12-07 DIAGNOSIS — D225 Melanocytic nevi of trunk: Secondary | ICD-10-CM

## 2019-12-07 DIAGNOSIS — I25119 Atherosclerotic heart disease of native coronary artery with unspecified angina pectoris: Secondary | ICD-10-CM

## 2019-12-07 DIAGNOSIS — D229 Melanocytic nevi, unspecified: Secondary | ICD-10-CM

## 2019-12-07 DIAGNOSIS — D489 Neoplasm of uncertain behavior, unspecified: Secondary | ICD-10-CM

## 2019-12-07 DIAGNOSIS — D485 Neoplasm of uncertain behavior of skin: Secondary | ICD-10-CM

## 2019-12-07 DIAGNOSIS — L820 Inflamed seborrheic keratosis: Secondary | ICD-10-CM | POA: Diagnosis not present

## 2019-12-07 NOTE — Patient Instructions (Signed)

## 2019-12-15 ENCOUNTER — Telehealth: Payer: Self-pay | Admitting: Cardiovascular Disease

## 2019-12-15 MED ORDER — KLOR-CON SPRINKLE 10 MEQ PO CPCR: 10.0000 meq | ORAL_CAPSULE | Freq: Every day | ORAL | 3 refills | Status: AC

## 2019-12-15 NOTE — Telephone Encounter (Signed)
Pt's son called and said that if its possible, he would like to change the medication potassium chloride (KLOR-CON) 10 MEQ tablet to capsules. Please call to verify

## 2019-12-15 NOTE — Telephone Encounter (Signed)
Brittney Gates requests potassium 10 meq capsule called in for the patient.  Called in per request.

## 2019-12-23 ENCOUNTER — Telehealth: Payer: Self-pay | Admitting: Internal Medicine

## 2019-12-23 MED ORDER — HYDROCODONE-ACETAMINOPHEN 5-325 MG PO TABS: 1.0000 | ORAL_TABLET | ORAL | 0 refills | Status: DC | PRN

## 2019-12-23 NOTE — Addendum Note (Signed)
Addended by: Binnie Rail on: 12/23/2019 04:11 PM   Modules accepted: Orders

## 2019-12-23 NOTE — Addendum Note (Signed)
Addended by: Binnie Rail on: 12/23/2019 04:38 PM   Modules accepted: Orders

## 2019-12-23 NOTE — Telephone Encounter (Signed)
   1.Medication Requested:HYDROcodone-acetaminophen (NORCO/VICODIN) 5-325 MG tablet  2. Pharmacy (Name, [facility], [place]): [facility], [place]  3. On Med List: yes  4. Last Visit with PCP: 09/09/19  5. Next visit date with PCP: n/a   Agent: Please be advised that RX refills may take up to 3 business days. We ask that you follow-up with your pharmacy.

## 2019-12-26 ENCOUNTER — Other Ambulatory Visit (HOSPITAL_COMMUNITY): Payer: Medicare Other

## 2019-12-28 ENCOUNTER — Other Ambulatory Visit: Payer: Self-pay

## 2019-12-28 ENCOUNTER — Ambulatory Visit (HOSPITAL_COMMUNITY): Payer: Medicare Other | Attending: Internal Medicine

## 2019-12-28 DIAGNOSIS — I350 Nonrheumatic aortic (valve) stenosis: Secondary | ICD-10-CM | POA: Insufficient documentation

## 2019-12-29 DIAGNOSIS — M25641 Stiffness of right hand, not elsewhere classified: Secondary | ICD-10-CM | POA: Diagnosis not present

## 2019-12-29 DIAGNOSIS — G5601 Carpal tunnel syndrome, right upper limb: Secondary | ICD-10-CM | POA: Diagnosis not present

## 2019-12-29 DIAGNOSIS — M79641 Pain in right hand: Secondary | ICD-10-CM | POA: Diagnosis not present

## 2019-12-29 DIAGNOSIS — M25642 Stiffness of left hand, not elsewhere classified: Secondary | ICD-10-CM | POA: Diagnosis not present

## 2019-12-29 DIAGNOSIS — M79642 Pain in left hand: Secondary | ICD-10-CM | POA: Diagnosis not present

## 2019-12-29 DIAGNOSIS — G5603 Carpal tunnel syndrome, bilateral upper limbs: Secondary | ICD-10-CM | POA: Diagnosis not present

## 2019-12-29 DIAGNOSIS — M13849 Other specified arthritis, unspecified hand: Secondary | ICD-10-CM | POA: Diagnosis not present

## 2019-12-29 LAB — ECHOCARDIOGRAM COMPLETE
AR max vel: 1.02 cm2
AV Area VTI: 0.86 cm2
AV Area mean vel: 0.91 cm2
AV Mean grad: 25 mmHg
AV Peak grad: 37.7 mmHg
Ao pk vel: 3.07 m/s
Area-P 1/2: 3.85 cm2
MV M vel: 5.52 m/s
MV Peak grad: 121.9 mmHg
S' Lateral: 5.1 cm

## 2019-12-30 ENCOUNTER — Telehealth: Payer: Self-pay | Admitting: Cardiovascular Disease

## 2019-12-30 NOTE — Telephone Encounter (Signed)
Called for patient's echo results.

## 2019-12-30 NOTE — Telephone Encounter (Signed)
Left message for [name] that they will be called with results after Dr. Burt Knack reviews them.

## 2020-01-02 ENCOUNTER — Encounter: Payer: Self-pay | Admitting: Dermatology

## 2020-01-02 NOTE — Progress Notes (Signed)
   Follow-Up Visit   Subjective  Brittney Gates is a 86 y.o. female who presents for the following: Skin Problem (Check spot on top of left ear. x months No pain but afriad it will come off when sleeping. ).  Growth Location: Left ear Duration:  Quality:  Associated Signs/Symptoms: Modifying Factors:  Severity:  Timing: Context:   Objective  Well appearing patient in no apparent distress; mood and affect are within normal limits.  A focused examination was performed including Head, neck, back.. Relevant physical exam findings are noted in the Assessment and Plan.   Assessment & Plan    Neoplasm of uncertain behavior Left upper ear rim  Skin / nail biopsy Type of biopsy: tangential   Informed consent: discussed and consent obtained   Timeout: patient name, date of birth, surgical site, and procedure verified   Procedure prep:  Patient was prepped and draped in usual sterile fashion Prep type:  Chlorhexidine Anesthesia: the lesion was anesthetized in a standard fashion   Anesthetic:  1% lidocaine w/ epinephrine 1-100,000 local infiltration Instrument used: flexible razor blade   Hemostasis achieved with: ferric subsulfate   Outcome: patient tolerated procedure well   Post-procedure details: wound care instructions given    Specimen 1 - Surgical pathology Differential Diagnosis: BCC SCC Wart Check Margins: No Cautery only  Nevus Mid Back  Annual skin examination     I, Lavonna Monarch, MD, have reviewed all documentation for this visit.  The documentation on 01/02/20 for the exam, diagnosis, procedures, and orders are all accurate and complete.

## 2020-01-04 ENCOUNTER — Encounter: Payer: Self-pay | Admitting: Physician Assistant

## 2020-01-04 ENCOUNTER — Other Ambulatory Visit: Payer: Self-pay

## 2020-01-04 ENCOUNTER — Other Ambulatory Visit (HOSPITAL_COMMUNITY): Payer: Self-pay | Admitting: Internal Medicine

## 2020-01-04 ENCOUNTER — Ambulatory Visit: Payer: Medicare Other | Attending: Internal Medicine

## 2020-01-04 ENCOUNTER — Ambulatory Visit (INDEPENDENT_AMBULATORY_CARE_PROVIDER_SITE_OTHER): Payer: Medicare Other | Admitting: Physician Assistant

## 2020-01-04 VITALS — BP 130/62 | HR 91 | Ht 67.0 in | Wt 167.4 lb

## 2020-01-04 DIAGNOSIS — I5020 Unspecified systolic (congestive) heart failure: Secondary | ICD-10-CM

## 2020-01-04 DIAGNOSIS — I350 Nonrheumatic aortic (valve) stenosis: Secondary | ICD-10-CM | POA: Diagnosis not present

## 2020-01-04 DIAGNOSIS — I25119 Atherosclerotic heart disease of native coronary artery with unspecified angina pectoris: Secondary | ICD-10-CM

## 2020-01-04 DIAGNOSIS — Z23 Encounter for immunization: Secondary | ICD-10-CM

## 2020-01-04 DIAGNOSIS — I10 Essential (primary) hypertension: Secondary | ICD-10-CM | POA: Diagnosis not present

## 2020-01-04 NOTE — Progress Notes (Signed)
   Covid-19 Vaccination Clinic  Name:  CARLYNE KEEHAN    MRN: [id number] DOB: 1925-01-05  01/04/2020  Ms. Daley was observed post Covid-19 immunization for 15 minutes without incident. She was provided with Vaccine Information Sheet and instruction to access the V-Safe system.   Ms. Cake was instructed to call 911 with any severe reactions post vaccine: Marland Kitchen Difficulty breathing  . Swelling of face and throat  . A fast heartbeat  . A bad rash all over body  . Dizziness and weakness

## 2020-01-04 NOTE — Patient Instructions (Signed)
Medication Instructions:  Your physician recommends that you continue on your current medications as directed. Please refer to the Current Medication list given to you today.  *If you need a refill on your cardiac medications before your next appointment, please call your pharmacy*  Lab Work: You will have labs drawn today: BMET  Testing/Procedures: None ordered today  Follow-Up: At [facility], you and your health needs are our priority.  As part of our continuing mission to provide you with exceptional heart care, we have created designated Provider Care Teams.  These Care Teams include your primary Cardiologist (physician) and Advanced Practice Providers (APPs -  Physician Assistants and Nurse Practitioners) who all work together to provide you with the care you need, when you need it.  Your next appointment:   4-6 week(s)  The format for your next appointment:   In Person  Provider:   Sherren Mocha, MD

## 2020-01-04 NOTE — Progress Notes (Signed)
Cardiology Office Note:    Date:  01/04/2020   ID:  Brittney Gates, DOB January 06, 1925, MRN [id number]  PCP:  Brittney Koch, MD  [facility] Cardiologist:  Brittney Mocha, MD  [place] Electrophysiologist:  None   Referring MD: Brittney Gates, *   Chief Complaint:  Follow-up (CHF, Aortic stenosis )    Patient Profile:    Brittney Gates is a 86 y.o. female with:   Coronary artery disease  ? Myoview 6/19- no ischemia ? Cath 7/21: pRCA 80, mRCA 80 >> Med Rx (consider high risk PCI of RCA if refractory angina)  Aortic stenosis ? Moderate, echo 09/2018: Mean AV gradient 25 ? Echo 4/21: EF 55-60, mean gradient 20 ? Echo 7/21: EF 30-35, mean 26 (severe) ? Cath 7/21: Mean gradient 18 (moderate) ? Echo 9/21: EF 35-40, mean 25  Heart failure with reduced ejection fraction   Non-ischemic cardiomyopathy (CM out of proportion to CAD)   Palpitations ? Event monitor 10/2018: Occ PVCs, short SVT, rare short NSVT, no pauses or AFib  Peripheral arterial disease  Hypertension  Hyperlipidemia  Hypothyroidism  Chronic kidney disease   Prior CV Studies: Echocardiogram 12/28/19 EF 35-40, Gr 2 DD, GLS -9.0% (abnormal), normal RVSF, mild-moderate MR, severe aortic stenosis (likely low-flow low gradient), mean gradient 25 mmHg, DI 0.25  Cardiac monitor 11/23/19 Normal sinus rhythm, avg HR 70, no AFib/Flutter; rare PVCs and occasional supraventricular beats, few short SVT runs  Cardiac catheterization 10/07/19 LAD mid 50 RCA prox 80, mid 80 PCWP 26 mmHg, mean PA 38 mmHg, CO 7.6, CI 4.1  Mod AS (mean 18 mmHg)  Echocardiogram 10/06/2019 EF 30-35, normal RVSF, mild-moderate LAE, mild-moderate MR, severe AS (mean 26 mmHg), pleural effusion  Echocardiogram 07/21/2019 EF 55-60, no RWMA, mild LVH, normal RVSF, moderate LAE, mild MR, moderate aortic stenosis (mean gradient 20.3 mmHg), dilated aortic root (40 mm)  VQ Scan 10/06/19 No evidence of PE  Long-term cardiac monitor  10/2018 NSR, avg HR 71 Occ PVCs, ventricular couplets, rare vent run (5 beats) No sig bradycardia, pathologic pauses Occ supraventricular ectopics, short paroxysms of SVT No AFib, Flutter  Echocardiogram 10/26/2018 EF 55-60, mild LVH, grade 1 diastolic dysfunction, severe LAE, mild RAE, mild MAC, moderate aortic stenosis (mean gradient 24.8, peak gradient 37)  Myoview 09/10/2017 EF 45, small fixed mid inferoseptal defect, probably artifact. Otherwise normal perfusion.  Echocardiogram 09/10/2017 EF 82-50, grade 1 diastolic dysfunction, moderate aortic stenosis (mean 13, peak 24), mild RAE  ABIs 10/2015 Known bilateral SFA disease, left greater than right Right ABI stable and in normal range; left ABI stable and in mid range   History of Present Illness:    Brittney Gates was admitted in July 2021 with acute systolic CHF.  Echocardiogram demonstrated newly depressed LV function with an EF of 30-35% and possible low flow severe aortic stenosis.  She underwent cardiac catheterization which demonstrated high-grade disease in the RCA and moderate disease in the LAD.  Her cardiomyopathy was felt to be out of proportion to her coronary artery disease.  It was not felt that the stenosis in the RCA required intervention unless she had refractory angina.  Medical therapy was pursued.  Her mean gradient on cardiac catheterization was 18 and she was felt to have more moderate aortic stenosis.  She saw Dr. Burt Gates in follow-up posthospitalization in July 2021.  He questioned whether or not her presentation in July could have represented a stress-induced cardiomyopathy.  Since that time, she has called in on  a couple of occasions with worsening shortness of breath and her daily furosemide dose was adjusted.  She also called in with low heart rates and a cardiac monitor demonstrated sinus rhythm with no significant, sustained arrhythmias.  A follow-up echocardiogram in 9/21 demonstrated no significant change in  her LV function with an EF of 35-40% and a mean aortic valve gradient of 25 mmHg.    She returns for follow-up.  She is here with her son.  Her breathing has stabilized since increasing her furosemide.  She has an occasional brief chest pain.  She has not had syncope, orthopnea, leg swelling.      Past Medical History:  Diagnosis Date  . Acute pancreatitis   . ANEMIA   . ANXIETY   . Aortic stenosis    Echo 06/2019: EF 55-60, no RWMA, mild LVH, normal RV SF, moderate LAE, mild MR, moderate aortic stenosis (mean 20.3 mmHg), mild dilation of aortic root (40 mm)  . Arthritis   . Chronic anemia   . Diverticulosis   . Gastritis   . Hemorrhoids   . Hiatal hernia   . HYPERLIPIDEMIA   . HYPERTENSION   . HYPOTHYROIDISM   . LOW BACK PAIN, CHRONIC   . PAD (peripheral artery disease) (HCC)     Current Medications: Current Meds  Medication Sig  . acetaminophen (TYLENOL) 325 MG tablet Take 325 mg by mouth every 6 (six) hours as needed for mild pain, moderate pain or headache.  Marland Kitchen aspirin EC 81 MG tablet Take 81 mg by mouth daily.  . Cholecalciferol 2000 UNITS TABS Take 2,000 Units by mouth daily.  . furosemide (LASIX) 40 MG tablet Take 1 tablet (40 mg total) by mouth daily.  Marland Kitchen HYDROcodone-acetaminophen (NORCO/VICODIN) 5-325 MG tablet Take 1 tablet by mouth every 4 (four) hours as needed for moderate pain.  Marland Kitchen KLOR-CON SPRINKLE 10 MEQ CR capsule Take 1 capsule (10 mEq total) by mouth daily.  Marland Kitchen levothyroxine (SYNTHROID) 100 MCG tablet TAKE ONE TABLET BY MOUTH EVERY MORNING BEFORE BREAKFAST (Patient taking differently: Take 100 mcg by mouth daily before breakfast. )  . Multiple Vitamin (MULTIVITAMIN WITH MINERALS) TABS tablet Take 1 tablet by mouth daily.  . nitroGLYCERIN (NITROSTAT) 0.4 MG SL tablet Place 1 tablet (0.4 mg total) under the tongue every 5 (five) minutes as needed for up to 30 doses for chest pain.  Brittney Gates 3-6-9 Fatty Acids (TRIPLE OMEGA-3-6-9 PO) Take 1 capsule by mouth daily.  .  pregabalin (LYRICA) 50 MG capsule TAKE ONE CAPSULE BY MOUTH TWICE A DAY (Patient taking differently: Take 50 mg by mouth daily. )  . sennosides-docusate sodium (SENOKOT-S) 8.6-50 MG tablet Take 2 tablets by mouth daily as needed for constipation.   Marland Kitchen zolpidem (AMBIEN) 10 MG tablet TAKE ONE TABLET BY MOUTH EVERY NIGHT AT BEDTIME AS NEEDED FOR SLEEP     Allergies:   Statins, Lidocaine, and Metamucil [psyllium]   Social History   Tobacco Use  . Smoking status: Never Smoker  . Smokeless tobacco: Never Used  Vaping Use  . Vaping Use: Never used  Substance Use Topics  . Alcohol use: No    Alcohol/week: 0.0 standard drinks  . Drug use: No     Family Hx: The patient's family history includes Gallbladder disease in her daughter and son; Lung cancer in her father. There is no history of Colon cancer.  Review of Systems  Gastrointestinal: Negative for hematochezia and melena.     EKGs/Labs/Other Test Reviewed:    EKG:  EKG is not ordered today.  The ekg ordered today demonstrates n/a  Recent Labs: 10/05/2019: ALT 15 10/07/2019: B Natriuretic Peptide 344.0 10/09/2019: TSH 4.157 10/11/2019: BUN 36; Creatinine, Ser 1.53; Hemoglobin 9.8; Platelets 137; Potassium 4.1; Sodium 138   Recent Lipid Panel Lab Results  Component Value Date/Time   CHOL 214 (H) 09/09/2019 02:51 PM   TRIG 141.0 09/09/2019 02:51 PM   TRIG 120 03/29/2010 12:00 AM   HDL 51.00 09/09/2019 02:51 PM   CHOLHDL 4 09/09/2019 02:51 PM   LDLCALC 135 (H) 09/09/2019 02:51 PM   LDLDIRECT 143.1 12/08/2012 12:23 PM    Physical Exam:    VS:  BP 130/62   Pulse 91   Ht _0  (1.702 m)   Wt 167 lb 6.4 oz (75.9 kg)   SpO2 94%   BMI 26.22 kg/m     Wt Readings from Last 3 Encounters:  01/04/20 167 lb 6.4 oz (75.9 kg)  10/19/19 175 lb (79.4 kg)  10/11/19 171 lb 1.2 oz (77.6 kg)     Constitutional:      Appearance: Healthy appearance. Not in distress.  Pulmonary:     Effort: Pulmonary effort is normal.     Breath sounds:  No wheezing. No rales.  Cardiovascular:     Normal rate. Regular rhythm. Normal S1.     Murmurs: There is a grade 3/6 crescendo-decrescendo systolic murmur at the URSB.  Edema:    Peripheral edema absent.  Abdominal:     Palpations: Abdomen is soft.  Skin:    General: Skin is warm and dry.  Neurological:     Mental Status: Alert and oriented to person, place and time.     Cranial Nerves: Cranial nerves are intact.      ASSESSMENT & PLAN:    1. Aortic valve stenosis, etiology of cardiac valve disease unspecified Likely severe aortic stenosis.  We had a long discussion today regarding the nature of aortic stenosis and natural progression.  I reviewed her case earlier today with Dr. Burt Gates.  He does not feel that she is a good TAVR candidate.  I discussed this with the patient and her son.  However, they would like to meet with Dr. Burt Gates to discuss this further.  I will try to get her into see him in the next few weeks.  We did discuss palliative care.  She would like to pursue this.  I will place a referral for palliative medicine.  2. HFrEF (heart failure with reduced ejection fraction) (HCC) EF 35-40.  Nonischemic cardiomyopathy.  NYHA III.  I am not certain that her cardiomyopathy is all related aortic stenosis.  Her blood pressure is well controlled.  I am hesitant to place her on a beta-blocker or ARB given her severe aortic stenosis.  Continue current dose of furosemide.  We discussed the importance of daily weights.  She already maintains a low-salt diet.  Obtain follow-up BMET today.  3. Coronary artery disease involving native coronary artery of native heart with angina pectoris ([place]) Single-vessel CAD by cardiac catheterization July 2021.  Medical management has been recommended.  Continue aspirin.  She is not having significant anginal symptoms.  4. Essential hypertension The patient's blood pressure is controlled on her current regimen.  Continue current therapy.     Dispo:   Return in about 4 weeks (around 02/01/2020) for w/ Dr. Burt Gates, in person.   Medication Adjustments/Labs and Tests Ordered: Current medicines are reviewed at length with the patient today.  Concerns regarding medicines  are outlined above.  Tests Ordered: Orders Placed This Encounter  Procedures  . Basic metabolic panel  . Amb Referral to Palliative Care   Medication Changes: No orders of the defined types were placed in this encounter.   Signed, Richardson Dopp, PA-C  01/04/2020 3:59 PM    [facility] [place], [place], [place]  [address] Phone: [phone]; Fax: [phone]

## 2020-01-05 LAB — BASIC METABOLIC PANEL
BUN/Creatinine Ratio: 31 — ABNORMAL HIGH (ref 12–28)
BUN: 41 mg/dL — ABNORMAL HIGH (ref 10–36)
CO2: 25 mmol/L (ref 20–29)
Calcium: 9.7 mg/dL (ref 8.7–10.3)
Chloride: 100 mmol/L (ref 96–106)
Creatinine, Ser: 1.32 mg/dL — ABNORMAL HIGH (ref 0.57–1.00)
GFR calc Af Amer: 40 mL/min/{1.73_m2} — ABNORMAL LOW (ref >59)
GFR calc non Af Amer: 35 mL/min/{1.73_m2} — ABNORMAL LOW (ref >59)
Glucose: 103 mg/dL — ABNORMAL HIGH (ref 65–99)
Potassium: 4.6 mmol/L (ref 3.5–5.2)
Sodium: 140 mmol/L (ref 134–144)

## 2020-01-10 ENCOUNTER — Telehealth: Payer: Self-pay | Admitting: Physician Assistant

## 2020-01-10 NOTE — Telephone Encounter (Signed)
John, son of the patient called. John called the [facility]. Hospice needs updated office notes, demographics page and a new order for Hospice Care to be faxed to them [phone]

## 2020-01-10 NOTE — Telephone Encounter (Signed)
John, son of the patient called. He wanted to know if Richardson Dopp had arranged Hospice care or palliative care yet. If [name] has not, he needs to know and he will arrange the care himself.

## 2020-01-10 NOTE — Telephone Encounter (Signed)
I called and spoke with patients son [name], he is aware that referral to Hospice and [facility] was started when patient was seen by Richardson Dopp on 01/04/20, he is aware that records were faxed and conformation was received. Provided John with the phone number for Hospice, he will call their office today.

## 2020-01-11 NOTE — Telephone Encounter (Signed)
I called and spoke with Latasha at [facility] this morning, their office did not receive the referral fax from 01/04/20; confirmed fax number with [facility] and sent again this morning. I called back to Hospice and did receive confirmation from [facility] that referral fax was received and they will be calling patients son today. John, patients son, is aware of the above.

## 2020-01-12 ENCOUNTER — Telehealth: Payer: Self-pay

## 2020-01-12 DIAGNOSIS — I25119 Atherosclerotic heart disease of native coronary artery with unspecified angina pectoris: Secondary | ICD-10-CM | POA: Diagnosis not present

## 2020-01-12 DIAGNOSIS — F419 Anxiety disorder, unspecified: Secondary | ICD-10-CM | POA: Diagnosis not present

## 2020-01-12 DIAGNOSIS — K861 Other chronic pancreatitis: Secondary | ICD-10-CM | POA: Diagnosis not present

## 2020-01-12 DIAGNOSIS — I509 Heart failure, unspecified: Secondary | ICD-10-CM | POA: Diagnosis not present

## 2020-01-12 DIAGNOSIS — E785 Hyperlipidemia, unspecified: Secondary | ICD-10-CM | POA: Diagnosis not present

## 2020-01-12 DIAGNOSIS — I110 Hypertensive heart disease with heart failure: Secondary | ICD-10-CM | POA: Diagnosis not present

## 2020-01-12 DIAGNOSIS — R32 Unspecified urinary incontinence: Secondary | ICD-10-CM | POA: Diagnosis not present

## 2020-01-12 DIAGNOSIS — K5790 Diverticulosis of intestine, part unspecified, without perforation or abscess without bleeding: Secondary | ICD-10-CM | POA: Diagnosis not present

## 2020-01-12 DIAGNOSIS — I739 Peripheral vascular disease, unspecified: Secondary | ICD-10-CM | POA: Diagnosis not present

## 2020-01-12 DIAGNOSIS — I350 Nonrheumatic aortic (valve) stenosis: Secondary | ICD-10-CM | POA: Diagnosis not present

## 2020-01-12 DIAGNOSIS — R159 Full incontinence of feces: Secondary | ICD-10-CM | POA: Diagnosis not present

## 2020-01-12 DIAGNOSIS — D649 Anemia, unspecified: Secondary | ICD-10-CM | POA: Diagnosis not present

## 2020-01-12 DIAGNOSIS — E039 Hypothyroidism, unspecified: Secondary | ICD-10-CM | POA: Diagnosis not present

## 2020-01-12 NOTE — Telephone Encounter (Signed)
(  11:21am) Telephone call to patient to schedule palliative care visit with patient. Patient's son-John declined palliative care services. He advised SW that patient is scheduled for a hospice consult tomorrow, 10/15 @ 2:30pm.  *palliative care team updated

## 2020-01-13 ENCOUNTER — Telehealth: Payer: Self-pay | Admitting: Cardiovascular Disease

## 2020-01-13 ENCOUNTER — Telehealth: Payer: Self-pay | Admitting: Internal Medicine

## 2020-01-13 DIAGNOSIS — K861 Other chronic pancreatitis: Secondary | ICD-10-CM | POA: Diagnosis not present

## 2020-01-13 DIAGNOSIS — I25119 Atherosclerotic heart disease of native coronary artery with unspecified angina pectoris: Secondary | ICD-10-CM | POA: Diagnosis not present

## 2020-01-13 DIAGNOSIS — F419 Anxiety disorder, unspecified: Secondary | ICD-10-CM | POA: Diagnosis not present

## 2020-01-13 DIAGNOSIS — E785 Hyperlipidemia, unspecified: Secondary | ICD-10-CM | POA: Diagnosis not present

## 2020-01-13 DIAGNOSIS — I350 Nonrheumatic aortic (valve) stenosis: Secondary | ICD-10-CM | POA: Diagnosis not present

## 2020-01-13 DIAGNOSIS — D649 Anemia, unspecified: Secondary | ICD-10-CM | POA: Diagnosis not present

## 2020-01-13 NOTE — Telephone Encounter (Signed)
Pain medications would be prescribed by her PCP.  If she is on Hospice, the Hospice MD should handle pain control. Richardson Dopp, PA-C    01/13/2020 2:02 PM

## 2020-01-13 NOTE — Telephone Encounter (Signed)
Spoke with Butch Penny with Hospice.  Advised per Richardson Dopp, PA-C Cardiology will not manage pain medications.  Pt's PCP or Hospice MD should be contacted for pain management.  Butch Penny states they [facility]) has medication "worked out" for pt.  She thanked Therapist, sports for call.

## 2020-01-13 NOTE — Telephone Encounter (Signed)
New message:    Butch Penny from hospice calling concerning this patient,It is about her pain medication. They would like to see if she could get something else. Please call back.

## 2020-01-13 NOTE — Telephone Encounter (Signed)
    Brittney Gates states he dropped off form for handicap permanent  Placard/plates. Calling for status of form

## 2020-01-16 DIAGNOSIS — D649 Anemia, unspecified: Secondary | ICD-10-CM | POA: Diagnosis not present

## 2020-01-16 DIAGNOSIS — K861 Other chronic pancreatitis: Secondary | ICD-10-CM | POA: Diagnosis not present

## 2020-01-16 DIAGNOSIS — E785 Hyperlipidemia, unspecified: Secondary | ICD-10-CM | POA: Diagnosis not present

## 2020-01-16 DIAGNOSIS — I25119 Atherosclerotic heart disease of native coronary artery with unspecified angina pectoris: Secondary | ICD-10-CM | POA: Diagnosis not present

## 2020-01-16 DIAGNOSIS — F419 Anxiety disorder, unspecified: Secondary | ICD-10-CM | POA: Diagnosis not present

## 2020-01-16 DIAGNOSIS — I350 Nonrheumatic aortic (valve) stenosis: Secondary | ICD-10-CM | POA: Diagnosis not present

## 2020-01-16 NOTE — Telephone Encounter (Signed)
Called pt, LVM stating that form was completed and mailed back to him as requested. He should be receiving that soon.

## 2020-01-17 ENCOUNTER — Telehealth: Payer: Self-pay | Admitting: Cardiovascular Disease

## 2020-01-17 DIAGNOSIS — D649 Anemia, unspecified: Secondary | ICD-10-CM | POA: Diagnosis not present

## 2020-01-17 DIAGNOSIS — F419 Anxiety disorder, unspecified: Secondary | ICD-10-CM | POA: Diagnosis not present

## 2020-01-17 DIAGNOSIS — E785 Hyperlipidemia, unspecified: Secondary | ICD-10-CM | POA: Diagnosis not present

## 2020-01-17 DIAGNOSIS — K861 Other chronic pancreatitis: Secondary | ICD-10-CM | POA: Diagnosis not present

## 2020-01-17 DIAGNOSIS — I350 Nonrheumatic aortic (valve) stenosis: Secondary | ICD-10-CM | POA: Diagnosis not present

## 2020-01-17 DIAGNOSIS — I25119 Atherosclerotic heart disease of native coronary artery with unspecified angina pectoris: Secondary | ICD-10-CM | POA: Diagnosis not present

## 2020-01-17 NOTE — Telephone Encounter (Signed)
[name] is calling wanting to know if he can still rely on Dr. Burt Knack for Brittney Gates's care even though she is now under hospice care. He states he does not want the hospice doctor's making medication changes without Dr. York Cerise input. Please advise.

## 2020-01-17 NOTE — Telephone Encounter (Signed)
The [facility] RN spoke with Brittney Gates today. The patient is scheduled to see Dr. Burt Knack this Friday and concerns will be addressed at that time.

## 2020-01-19 DIAGNOSIS — I25119 Atherosclerotic heart disease of native coronary artery with unspecified angina pectoris: Secondary | ICD-10-CM | POA: Diagnosis not present

## 2020-01-19 DIAGNOSIS — K861 Other chronic pancreatitis: Secondary | ICD-10-CM | POA: Diagnosis not present

## 2020-01-19 DIAGNOSIS — F419 Anxiety disorder, unspecified: Secondary | ICD-10-CM | POA: Diagnosis not present

## 2020-01-19 DIAGNOSIS — E785 Hyperlipidemia, unspecified: Secondary | ICD-10-CM | POA: Diagnosis not present

## 2020-01-19 DIAGNOSIS — I350 Nonrheumatic aortic (valve) stenosis: Secondary | ICD-10-CM | POA: Diagnosis not present

## 2020-01-19 DIAGNOSIS — D649 Anemia, unspecified: Secondary | ICD-10-CM | POA: Diagnosis not present

## 2020-01-20 ENCOUNTER — Telehealth: Payer: Self-pay | Admitting: Cardiovascular Disease

## 2020-01-20 ENCOUNTER — Ambulatory Visit: Payer: Medicare Other | Admitting: Cardiovascular Disease

## 2020-01-20 DIAGNOSIS — D649 Anemia, unspecified: Secondary | ICD-10-CM | POA: Diagnosis not present

## 2020-01-20 DIAGNOSIS — E785 Hyperlipidemia, unspecified: Secondary | ICD-10-CM | POA: Diagnosis not present

## 2020-01-20 DIAGNOSIS — I25119 Atherosclerotic heart disease of native coronary artery with unspecified angina pectoris: Secondary | ICD-10-CM | POA: Diagnosis not present

## 2020-01-20 DIAGNOSIS — F419 Anxiety disorder, unspecified: Secondary | ICD-10-CM | POA: Diagnosis not present

## 2020-01-20 DIAGNOSIS — I350 Nonrheumatic aortic (valve) stenosis: Secondary | ICD-10-CM | POA: Diagnosis not present

## 2020-01-20 DIAGNOSIS — K861 Other chronic pancreatitis: Secondary | ICD-10-CM | POA: Diagnosis not present

## 2020-01-20 NOTE — Telephone Encounter (Signed)
Spoke to the patient and her son.  The patient's son reports that since she is doing OK and they don't really think she needs Hospice quite yet, they have cancelled Hospice and will meet with Palliative Care next week so they can still utilize her MDs without having to get Hospice approval for each visit.  He states [name] was so informative at his visit that they didn't need to have Dr. Burt Knack say the same thing when Brittney Gates will not do the procedure (he admits he wanted her to proceed, not her necessarily). Per request, scheduled the patient for 3 month visit in January to touch base with Dr. Burt Knack. They were grateful for call and agree with treatment plan.

## 2020-01-20 NOTE — Telephone Encounter (Signed)
Patient's son is following up. He states the patient has been extremely nervous and he would like to know if there is a mild anxiety medication that Dr. Burt Knack can prescribe. Please advise.

## 2020-01-20 NOTE — Telephone Encounter (Signed)
Recommended to the patient's son to call PCP for anti-anxiety medications. He was grateful for assistance.

## 2020-01-20 NOTE — Telephone Encounter (Signed)
Brittney Gates with [facility] called in and pt is going to switch to their Palliative program.  Pt family would like to still have access to see the providers drs right now and not ready for Hospice.  Just fyi     Best number [phone]

## 2020-01-23 ENCOUNTER — Ambulatory Visit: Payer: Medicare Other | Admitting: Internal Medicine

## 2020-01-24 ENCOUNTER — Other Ambulatory Visit: Payer: Self-pay

## 2020-01-24 ENCOUNTER — Other Ambulatory Visit: Payer: Medicare Other | Admitting: *Deleted

## 2020-01-24 ENCOUNTER — Other Ambulatory Visit: Payer: Medicare Other

## 2020-01-24 VITALS — BP 127/54 | HR 65 | Temp 97.6°F | Resp 19

## 2020-01-24 DIAGNOSIS — Z515 Encounter for palliative care: Secondary | ICD-10-CM

## 2020-01-24 NOTE — Progress Notes (Signed)
COMMUNITY PALLIATIVE CARE SW NOTE  PATIENT NAME: Brittney Gates DOB: January 20, 1925 MRN: [id number]  PRIMARY CARE PROVIDER: [name], MD  RESPONSIBLE PARTY:  Acct ID - Guarantor Home Phone Work Phone Relationship Acct Type  [id number] PAULEEN, [phone]  Self P/F     [address], [place], [place] [address]     PLAN OF CARE and INTERVENTIONS:             1. GOALS OF CARE/ ADVANCE CARE PLANNING:  Goal is for patient to remain in her home, safely and independently. Patient is a DNR, forms left in the home.  2. SOCIAL/EMOTIONAL/SPIRITUAL ASSESSMENT/ INTERVENTIONS:  SW and RN- Daryl Eastern completed initial palliative care visit with patient and her son-John in her home. Patient and her son shared that patient discharged from hospice because she felt she was not ready for comfort care. The team provided education regarding palliative care services, role in patient's care, visit frequency and how to access support. Patient provided verbal consent to ongoing palliative care visits/supports. Patient reported that she has increased pain in her lower back, legs and feet that starts in the evenings. Patient has pain medication that is giving her some relief. She does not like to take medications that will affect her cognitive state and balance, so she tries to take medication only as needed. Patient utilizes her walker to ambulate. She is independent of personal care needs, but her son is usually on standby for assistance. Patient has intermittent numbness to their hands. Her son does all the household chores and has his own health issues. SW encouraged self-care and allow family members to assist him with patient's care. SW provided resources. Patient eating two small meals. Patient has no swelling in her legs and no history of falls. Patient has a history of back issues. SOCIAL HISTORY: Patient was born in [place]. 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 She has been widowed for 6 1/2 years. She has four children, her daughter is deceased. Patient is of [employer]. Patient has a living will, POA/HCPOA and DNR's were left in the home. SW provided education, supportive presence, active listening, reassurance of support, provided resources for private care assistance and encouraged self care for PCG.   3. PATIENT/CAREGIVER EDUCATION/ COPING:  Patient is alert and oriented x3, but is forgetful. She relies on her son to fill in information she is confused about or forget.  4. PERSONAL EMERGENCY PLAN: 911 can be activated for emergencies. 5. COMMUNITY RESOURCES COORDINATION/ HEALTH CARE NAVIGATION: SW provided resources for private caregivers. 6. FINANCIAL/LEGAL CONCERNS/INTERVENTIONS:  None.     SOCIAL HX:  Social History   Tobacco Use  . Smoking status: Never Smoker  . Smokeless tobacco: Never Used  Substance Use Topics  . Alcohol use: No    Alcohol/week: 0.0 standard drinks    CODE STATUS: DNR ADVANCED DIRECTIVES: Yes MOST FORM COMPLETE:  No HOSPICE EDUCATION PROVIDED: Yes, Palliative verses Hospice  PPS: Patient is alert and oriented x3 with some forgetfulness; she ambulates with a walker, and is independent for personal care needs.  Duration of visit and documentation: 60 minutes.      [address] [place], [place]

## 2020-01-25 ENCOUNTER — Ambulatory Visit: Payer: Medicare Other | Admitting: Cardiovascular Disease

## 2020-01-30 NOTE — Progress Notes (Signed)
COMMUNITY PALLIATIVE CARE RN NOTE  PATIENT NAME: Brittney Gates DOB: [birth year] MRN: [id number]  PRIMARY CARE PROVIDER: [name], MD  RESPONSIBLE PARTY:  Acct ID - Guarantor Home Phone Work Phone Relationship Acct Type  [id number] Brittney Gates, Brittney Gates [phone]  Self P/F     [address], [place], [place] [address]   Covid-19 Pre-screening Negative  PLAN OF CARE and INTERVENTION:  1. ADVANCE CARE PLANNING/GOALS OF CARE: Goal is for patient to remain in her home with son as her caregiver. She is a DNR. 2. PATIENT/CAREGIVER EDUCATION: Explained Palliative care services, symptom management, pain management, safe mobility 3. DISEASE STATUS: Joint visit made with Palliative care SW, Monica Lonon. Met with patient and her son in their home. Patient is alert and oriented x 4, pleasant and engaging. She c/o pain in the base of her spine and nerves in her legs and feet. She currently rates her pain 4/10 (moderate). Her pain level increases as the day progresses. She is currently taking Hydrocodone for mild to moderate pain and Oxycodone for more severe pain. She always has to take medication around 6-7pm when it seems to be the worse. We discussed making sure she stays on top of the pain. She is hesitant about taking it as she does not want it to affect her balance or cognition. She also has numbness in her hands and fingers. Because of this, she is no longer cooking. She will mix things together for her son to cook. She is ambulatory using a walker.She is able to perform her own showers, dressing and toileting. She has a shower chair and rails in her bathroom for safety. Her son is on stand-by just in case she needs help. She is continent of both bowel and bladder. Her appetite is ok. She mainly eats 2 meals/day. Her portion sizes are smaller. She denies dysphagia. She does not take naps during the day. She is taking Ambien at night to help her sleep. She takes 5 mg initially, then she  usually wakes up around 3:30-4am d/t leg pain and will take pain medication and another Ambien to sleep the rest of the night. She has had 3 exacerbations of fluid build-up recently. Her son noted gurgling and wheezing. She was given and extra dose of Lasix, which has been effective. She has been vaccinated and received her Booster vaccine on 01/04/20. She is agreeable to future visits from Palliative care. She just recently revoked hospice services as her and her son did not feel that she was ready for this service at this time. Will continue to monitor.   HISTORY OF PRESENT ILLNESS:  This is a 86 yo female with a history of heart failure with reduced EF, aortic stenosis, essential hypertension, hypothyroidism, osteoarthritis, CKD Stage 3, hyperlipidemia and anxiety. Palliative care team has been asked to follow patient for additional support. Will visit patient monthly and PRN.  CODE STATUS: DNR  ADVANCED DIRECTIVES: Y MOST FORM: no PPS: 50%   PHYSICAL EXAM:   VITALS: Today's Vitals   01/24/20 1346  BP: (!) 127/54  Pulse: 65  Resp: 19  Temp: 97.6 F (36.4 C)  TempSrc: Temporal  SpO2: 97%  PainSc: 4   PainLoc: Back    LUNGS: clear to auscultation  CARDIAC: Cor RRR EXTREMITIES: No edema SKIN: Exposed skin is dry and intact; denies any skin issues  NEURO: Alert and oriented x 4, pleasant mood, generalized weakness, ambulatory w/walker   (Duration of visit and documentation 75 minutes)   [place] [place]  Nadara Mustard, RN BSN

## 2020-03-09 DIAGNOSIS — H00025 Hordeolum internum left lower eyelid: Secondary | ICD-10-CM | POA: Diagnosis not present

## 2020-03-12 ENCOUNTER — Other Ambulatory Visit: Payer: Medicare Other

## 2020-03-12 DIAGNOSIS — Z515 Encounter for palliative care: Secondary | ICD-10-CM

## 2020-03-13 ENCOUNTER — Other Ambulatory Visit: Payer: Self-pay

## 2020-03-14 NOTE — Progress Notes (Signed)
COMMUNITY PALLIATIVE CARE SW NOTE  PATIENT NAME: Brittney Gates DOB: 05-24-1924 MRN: [id number]  PRIMARY CARE PROVIDER: [name], MD  RESPONSIBLE PARTY:  Acct ID - Guarantor Home Phone Work Phone Relationship Acct Type  [id number] Brittney Gates, [phone]  Self P/F     [address], [place], [place] [address]     PLAN OF CARE and INTERVENTIONS:             1. GOALS OF CARE/ ADVANCE CARE PLANNING:  Goal is for patient to remain in her home, safely and independently. Patient is a DNR, forms left in the home.   2. SOCIAL/EMOTIONAL/SPIRITUAL ASSESSMENT/ INTERVENTIONS: SW completed a telephonic visit with patient/son after declining a face-to-face visit due to time constraints. Patient's weight has been stable, however patient has periods where she feels bloated.She is eating 2/3 smalls meals a day.She saw the eye doctor recently due to an infection on her eyelid. She took antibiotics for a week, but it has not improved. Patient has been taking senokot and finally had a bowel movement today after three days. Patient continues to have intermittent generalized pain all over that patient finds relief through taking baby aspirin. Patient has had no falls and no recent emergency room visits. PCG is adequately coping. Patient and PCG is having family visiting for the holidays and is very excited. SW to continue to provide ongoing psychosocial support to patient and PCG through regular visits, which they remain open to.  3. PATIENT/CAREGIVER EDUCATION/ COPING:  Patient is generally alert and oriented x3, but is forgetful. She relies on her son to fill in information she is confused about or forget.  1.  2. PERSONAL EMERGENCY PLAN: 911 can be activated for emergencies. 3. COMMUNITY RESOURCES COORDINATION/ HEALTH CARE NAVIGATION:  SW reinforced access to palliative care support. 4. FINANCIAL/LEGAL CONCERNS/INTERVENTIONS:  None     SOCIAL HX:  Social History   Tobacco Use  .  Smoking status: Never Smoker  . Smokeless tobacco: Never Used  Substance Use Topics  . Alcohol use: No    Alcohol/week: 0.0 standard drinks    CODE STATUS: DNR  ADVANCED DIRECTIVES: Yes MOST FORM COMPLETE:  No HOSPICE EDUCATION PROVIDED: No  PPS: Patient is generally alert and oriented x3 with some forgetfulness; she ambulates with a walker, and is independent for personal care needs.  Duration of telephonic visit and documentation: 30 minutes.        [address] [place], [place]

## 2020-03-21 ENCOUNTER — Telehealth: Payer: Self-pay | Admitting: Internal Medicine

## 2020-03-21 NOTE — Progress Notes (Signed)
  Chronic Care Management   Note  03/21/2020 Name: Brittney Gates MRN: [id number] DOB: 1924-12-05  Brittney Gates is a 86 y.o. year old female who is a primary care patient of [name], MD. I reached out to [name] by phone today in response to a referral sent by [name]'s PCP, [name], MD.   [name] was given information about Chronic Care Management services today including:  1. CCM service includes personalized support from designated clinical staff supervised by her physician, including individualized plan of care and coordination with other care providers 2. 24/7 contact phone numbers for assistance for urgent and routine care needs. 3. Service will only be billed when office clinical staff spend 20 minutes or more in a month to coordinate care. 4. Only one practitioner may furnish and bill the service in a calendar month. 5. The patient may stop CCM services at any time (effective at the end of the month) by phone call to the office staff.   Patient agreed to services and verbal consent obtained.   Follow up plan:   Carley Perdue [employer] Scheduler

## 2020-03-27 ENCOUNTER — Telehealth: Payer: Self-pay | Admitting: Internal Medicine

## 2020-03-27 NOTE — Telephone Encounter (Signed)
Patients son [name] calling stating he has been really sick and he is her primary care giver and he is trying to make sure she doesn't get it also. He is getting tested for covid on Thursday and she is getting tested on Sunday but the son is wondering if there is anything that we can do for the patient if she test positive \ [phone]

## 2020-03-28 DIAGNOSIS — J22 Unspecified acute lower respiratory infection: Secondary | ICD-10-CM | POA: Diagnosis not present

## 2020-03-28 DIAGNOSIS — R059 Cough, unspecified: Secondary | ICD-10-CM | POA: Diagnosis not present

## 2020-03-28 NOTE — Telephone Encounter (Signed)
At this time monoclonal antibody treatment is being reserved for those who are unvaccinated but we can help with symptom management if she gets sick.

## 2020-03-28 NOTE — Telephone Encounter (Signed)
Patient's son informed of below. He will contact us if his mother needs anything further.

## 2020-03-29 ENCOUNTER — Telehealth: Payer: Self-pay | Admitting: Internal Medicine

## 2020-03-29 DIAGNOSIS — J22 Unspecified acute lower respiratory infection: Secondary | ICD-10-CM | POA: Diagnosis not present

## 2020-03-29 DIAGNOSIS — R059 Cough, unspecified: Secondary | ICD-10-CM | POA: Diagnosis not present

## 2020-03-29 DIAGNOSIS — Z03818 Encounter for observation for suspected exposure to other biological agents ruled out: Secondary | ICD-10-CM | POA: Diagnosis not present

## 2020-03-29 NOTE — Telephone Encounter (Signed)
   1.Medication Requested: HYDROcodone-acetaminophen (NORCO/VICODIN) 5-325 MG tablet  2. Pharmacy (Name, Street, City):[facility] - [place], [place] - [address]  3. On Med List: yes  4. Last Visit with PCP: 09/09/19  5. Next visit date with PCP:   Agent: Please be advised that RX refills may take up to 3 business days. We ask that you follow-up with your pharmacy.

## 2020-03-31 ENCOUNTER — Other Ambulatory Visit: Payer: Self-pay | Admitting: Internal Medicine

## 2020-03-31 DIAGNOSIS — E039 Hypothyroidism, unspecified: Secondary | ICD-10-CM

## 2020-04-03 ENCOUNTER — Telehealth: Payer: Self-pay | Admitting: Cardiovascular Disease

## 2020-04-03 NOTE — Telephone Encounter (Signed)
I spoke to pt's son- he states this can wait until next week.

## 2020-04-03 NOTE — Telephone Encounter (Signed)
New Message:    Son wants to know if pt can do a Virtual Visit for her appt on Friday(04-06-20) with Dr Excell Seltzer please?

## 2020-04-03 NOTE — Telephone Encounter (Signed)
I cannot do controlled remotely, can you call to see if they mind waiting until next week otherwise can we send to someone in office to refill this? Thanks

## 2020-04-03 NOTE — Telephone Encounter (Signed)
Spoke with patient's son, DPR, who wishes not to bring his mom to the office Friday. Switched appointment to virtual visit and reviewed instructions.  He was grateful for call.  Consent documented:   Patient Consent for Virtual Visit         Brittney Gates has provided verbal consent on 04/03/2020 for a virtual visit (video or telephone).   CONSENT FOR VIRTUAL VISIT FOR:  Brittney Gates  By participating in this virtual visit I agree to the following:  I hereby voluntarily request, consent and authorize [facility] and its employed or contracted physicians, physician assistants, nurse practitioners or other licensed health care professionals (the Practitioner), to provide me with telemedicine health care services (the "Services") as deemed necessary by the treating Practitioner. I acknowledge and consent to receive the Services by the Practitioner via telemedicine. I understand that the telemedicine visit will involve communicating with the Practitioner through live audiovisual communication technology and the disclosure of certain medical information by electronic transmission. I acknowledge that I have been given the opportunity to request an in-person assessment or other available alternative prior to the telemedicine visit and am voluntarily participating in the telemedicine visit.  I understand that I have the right to withhold or withdraw my consent to the use of telemedicine in the course of my care at any time, without affecting my right to future care or treatment, and that the Practitioner or I may terminate the telemedicine visit at any time. I understand that I have the right to inspect all information obtained and/or recorded in the course of the telemedicine visit and may receive copies of available information for a reasonable fee.  I understand that some of the potential risks of receiving the Services via telemedicine include:  Marland Kitchen Delay or interruption in medical evaluation due to  technological equipment failure or disruption; . Information transmitted may not be sufficient (e.g. poor resolution of images) to allow for appropriate medical decision making by the Practitioner; and/or  . In rare instances, security protocols could fail, causing a breach of personal health information.  Furthermore, I acknowledge that it is my responsibility to provide information about my medical history, conditions and care that is complete and accurate to the best of my ability. I acknowledge that Practitioner's advice, recommendations, and/or decision may be based on factors not within their control, such as incomplete or inaccurate data provided by me or distortions of diagnostic images or specimens that may result from electronic transmissions. I understand that the practice of medicine is not an exact science and that Practitioner makes no warranties or guarantees regarding treatment outcomes. I acknowledge that a copy of this consent can be made available to me via my patient portal [facility] MyChart), or I can request a printed copy by calling the office of [facility].    I understand that my insurance will be billed for this visit.   I have read or had this consent read to me. . I understand the contents of this consent, which adequately explains the benefits and risks of the Services being provided via telemedicine.  . I have been provided ample opportunity to ask questions regarding this consent and the Services and have had my questions answered to my satisfaction. . I give my informed consent for the services to be provided through the use of telemedicine in my medical care

## 2020-04-06 ENCOUNTER — Encounter: Payer: Self-pay | Admitting: Cardiovascular Disease

## 2020-04-06 ENCOUNTER — Other Ambulatory Visit: Payer: Self-pay

## 2020-04-06 ENCOUNTER — Telehealth (INDEPENDENT_AMBULATORY_CARE_PROVIDER_SITE_OTHER): Payer: Medicare Other | Admitting: Cardiovascular Disease

## 2020-04-06 VITALS — BP 166/57 | HR 62 | Ht 67.0 in | Wt 167.0 lb

## 2020-04-06 DIAGNOSIS — I350 Nonrheumatic aortic (valve) stenosis: Secondary | ICD-10-CM

## 2020-04-06 DIAGNOSIS — I25119 Atherosclerotic heart disease of native coronary artery with unspecified angina pectoris: Secondary | ICD-10-CM | POA: Diagnosis not present

## 2020-04-06 DIAGNOSIS — I5032 Chronic diastolic (congestive) heart failure: Secondary | ICD-10-CM

## 2020-04-06 NOTE — Addendum Note (Signed)
Addended by: Harland German A on: 04/06/2020 02:58 PM   Modules accepted: Orders

## 2020-04-06 NOTE — Progress Notes (Signed)
Virtual Visit via Video Note   This visit type was conducted due to national recommendations for restrictions regarding the COVID-19 Pandemic (e.g. social distancing) in an effort to limit this patient's exposure and mitigate transmission in our community.  Due to her co-morbid illnesses, this patient is at least at moderate risk for complications without adequate follow up.  This format is felt to be most appropriate for this patient at this time.  All issues noted in this document were discussed and addressed.  A limited physical exam was performed with this format.  Please refer to the patient's chart for her consent to telehealth for [facility].       Date:  04/06/2020   ID:  Brittney Gates, DOB 05-30-1924, MRN [id number] The patient was identified using 2 identifiers.  Patient Location: Home Provider Location: Office/Clinic  PCP:  [name], MD  Cardiologist:  Sherren Mocha, MD  Electrophysiologist:  None   Evaluation Performed:  Follow-Up Visit  Chief Complaint:  Shortness of Breath  History of Present Illness:    Brittney Gates is a 87 y.o. female with:  Coronary artery disease  ? Myoview 6/19-no ischemia ? Cath 7/21: pRCA 80, mRCA 80 >> Med Rx (consider high risk PCI of RCA if refractory angina)  Aortic stenosis ? Moderate, echo 09/2018: Mean AV gradient 25 ? Echo 4/21: EF 55-60, mean gradient 20 ? Echo 7/21: EF 30-35, mean 26 (severe) ? Cath 7/21: Mean gradient 18 (moderate) ? Echo 9/21: EF 35-40, mean 25  Heart failure with reduced ejection fraction  ? Non-ischemic cardiomyopathy (CM out of proportion to CAD)   Palpitations ? Event monitor 10/2018: Occ PVCs, short SVT, rare short NSVT, no pauses or AFib  Peripheral arterial disease  Hypertension  Hyperlipidemia  Hypothyroidism  Chronic kidney disease    The patient does not have symptoms concerning for COVID-19 infection (fever, chills, cough, or new shortness of breath).   The patient  is interviewed over Higher education careers adviser today.  Her son is present with her in their home.  The patient's son, Brittney Gates, reports that he has been very sick with a viral illness over the past few weeks.  He and his mother (the patient) have both been tested for COVID-19 and are negative.  The patient has exhibited no symptoms.  She continues to have occasional chest discomfort and feels like heaviness across the front of the chest.  She denies shortness of breath, orthopnea, or PND.  Approximately 5 days ago she had a spell of poor responsiveness that lasted about 20 minutes.  Her blood pressure was not checked at the time.  This passed on its own and she quickly returned to baseline.  She continues to take her medications regularly.   Past Medical History:  Diagnosis Date  . Acute pancreatitis   . ANEMIA   . ANXIETY   . Aortic stenosis    Echo 06/2019: EF 55-60, no RWMA, mild LVH, normal RV SF, moderate LAE, mild MR, moderate aortic stenosis (mean 20.3 mmHg), mild dilation of aortic root (40 mm)  . Arthritis   . Chronic anemia   . Diverticulosis   . Gastritis   . Hemorrhoids   . Hiatal hernia   . HYPERLIPIDEMIA   . HYPERTENSION   . HYPOTHYROIDISM   . LOW BACK PAIN, CHRONIC   . PAD (peripheral artery disease) ([place])    Past Surgical History:  Procedure Laterality Date  . ANGIOPLASTY  1995  . APPENDECTOMY    .  BACK SURGERY     x's 2  . BREAST SURGERY     Benign  . FEMUR IM NAIL  01/21/2012   Procedure: INTRAMEDULLARY (IM) NAIL FEMORAL;  Surgeon: Newt Minion, MD;  Location: [facility];  Service: Orthopedics;  Laterality: Left;  Intertrochanteric Nail Left Hip  . HEMORRHOID SURGERY    . IR GENERIC HISTORICAL  05/29/2016   IR SACROPLASTY BILATERAL 05/29/2016 Brittney Bras, MD [facility]INTER[facility]  . IR GENERIC HISTORICAL  06/19/2016   IR RADIOLOGIST EVAL & MGMT 06/19/2016 [facility]  . RIGHT/LEFT HEART CATH AND CORONARY ANGIOGRAPHY N/A 10/07/2019   Procedure: RIGHT/LEFT HEART CATH AND  CORONARY ANGIOGRAPHY;  Surgeon: Jettie Booze, MD;  Location: [facility];  Service: Cardiovascular;  Laterality: N/A;  . TUBAL LIGATION       Current Meds  Medication Sig  . acetaminophen (TYLENOL) 325 MG tablet Take 325 mg by mouth every 6 (six) hours as needed for mild pain, moderate pain or headache.  Marland Kitchen aspirin EC 81 MG tablet Take 81 mg by mouth daily.  . Cholecalciferol 2000 UNITS TABS Take 2,000 Units by mouth daily.  . furosemide (LASIX) 40 MG tablet Take 1 tablet (40 mg total) by mouth daily.  Marland Kitchen HYDROcodone-acetaminophen (NORCO/VICODIN) 5-325 MG tablet Take 1 tablet by mouth every 4 (four) hours as needed for moderate pain.  Marland Kitchen KLOR-CON SPRINKLE 10 MEQ CR capsule Take 1 capsule (10 mEq total) by mouth daily.  Marland Kitchen levothyroxine (SYNTHROID) 100 MCG tablet TAKE ONE TABLET BY MOUTH EVERY MORNING BEFORE BREAKFAST  . Multiple Vitamin (MULTIVITAMIN WITH MINERALS) TABS tablet Take 1 tablet by mouth daily.  . nitroGLYCERIN (NITROSTAT) 0.4 MG SL tablet Place 1 tablet (0.4 mg total) under the tongue every 5 (five) minutes as needed for up to 30 doses for chest pain.  Brittney Gates 3-6-9 Fatty Acids (TRIPLE OMEGA-3-6-9 PO) Take 1 capsule by mouth daily.  Marland Kitchen oxyCODONE (OXY IR/ROXICODONE) 5 MG immediate release tablet Take 5 mg by mouth every 4 (four) hours as needed for severe pain.  . pregabalin (LYRICA) 50 MG capsule TAKE ONE CAPSULE BY MOUTH TWICE A DAY  . sennosides-docusate sodium (SENOKOT-S) 8.6-50 MG tablet Take 2 tablets by mouth daily as needed for constipation.   Marland Kitchen zolpidem (AMBIEN) 10 MG tablet TAKE ONE TABLET BY MOUTH EVERY NIGHT AT BEDTIME AS NEEDED FOR SLEEP     Allergies:   Statins, Lidocaine, and Metamucil [psyllium]   Social History   Tobacco Use  . Smoking status: Never Smoker  . Smokeless tobacco: Never Used  Vaping Use  . Vaping Use: Never used  Substance Use Topics  . Alcohol use: No    Alcohol/week: 0.0 standard drinks  . Drug use: No     Family Hx: The  patient's family history includes Gallbladder disease in her daughter and son; Lung cancer in her father. There is no history of Colon cancer.  ROS:   Please see the history of present illness.    All other systems reviewed and are negative.  Prior CV studies:   The following studies were reviewed today:  Echo 12-28-2019: IMPRESSIONS    1. Left ventricular ejection fraction, by estimation, is 35 to 40%. The  left ventricle has moderately decreased function. The left ventricle  demonstrates regional wall motion abnormalities (see scoring  diagram/findings for description). The left  ventricular internal cavity size was moderately dilated. Left ventricular  diastolic parameters are consistent with Grade II diastolic dysfunction  (pseudonormalization). Elevated left ventricular end-diastolic  pressure.  The E/e' is 55. The average left  ventricular global longitudinal strain is -9.0 %. The global longitudinal  strain is abnormal.  2. Right ventricular systolic function is normal. The right ventricular  size is normal.  3. The mitral valve is degenerative. Mild to moderate mitral valve  regurgitation. No evidence of mitral stenosis. Moderate mitral annular  calcification.  4. Severe AS, likely low flow low gradient aortic valve stenosis. AVA  0.86 cm2, iAVA 0.45 cm2/m2. SVI 36 mL. Dimensionless index 0.25. The  aortic valve is calcified. There is severe calcification of the aortic  valve. Aortic valve regurgitation is not  visualized. No aortic stenosis is present. Aortic valve mean gradient  measures 25.0 mmHg.  5. The inferior vena cava is normal in size with greater than 50%  respiratory variability, suggesting right atrial pressure of 3 mmHg.   Comparison(s): A prior study was performed on 10/06/19. No significant  change from prior study. Prior images reviewed side by side.   Labs/Other Tests and Data Reviewed:    EKG:  No ECG reviewed.  Recent Labs: 10/05/2019: ALT  15 10/07/2019: B Natriuretic Peptide 344.0 10/09/2019: TSH 4.157 10/11/2019: Hemoglobin 9.8; Platelets 137 01/04/2020: BUN 41; Creatinine, Ser 1.32; Potassium 4.6; Sodium 140   Recent Lipid Panel Lab Results  Component Value Date/Time   CHOL 214 (H) 09/09/2019 02:51 PM   TRIG 141.0 09/09/2019 02:51 PM   TRIG 120 03/29/2010 12:00 AM   HDL 51.00 09/09/2019 02:51 PM   CHOLHDL 4 09/09/2019 02:51 PM   LDLCALC 135 (H) 09/09/2019 02:51 PM   LDLDIRECT 143.1 12/08/2012 12:23 PM    Wt Readings from Last 3 Encounters:  04/06/20 167 lb (75.8 kg)  01/04/20 167 lb 6.4 oz (75.9 kg)  10/19/19 175 lb (79.4 kg)     Risk Assessment/Calculations:      Objective:    Vital Signs:  BP (!) 166/57   Pulse 62   Ht 5\' 7"  (1.702 m)   Wt 167 lb (75.8 kg)   SpO2 93%   BMI 26.16 kg/m    VITAL SIGNS:  reviewed The patient is alert, oriented, in no distress.  She is breathing comfortably on room air and normal conversation.  ASSESSMENT & PLAN:    1. Severe, stage D3 aortic stenosis: The patient appears clinically stable.  She has moderate to severe aortic stenosis.  We will update an echo when she returns for follow-up in 3 months in person.  We will request a same-day echocardiogram at the time of her office visit.  No medicine changes are made today.  Previously have elected for conservative therapy considering her advanced age and limited functional capacity. 2. CAD, native vessel, with angina: Cardiac catheterization last year showed severe RCA stenosis with medical therapy recommended.  Her anginal symptoms appear stable.  She continues on aspirin for antiplatelet therapy. 3. Chronic combined systolic and diastolic heart failure: Treated with a loop diuretic.  With chronic kidney disease and very advanced age, felt to be at high risk of acute kidney injury with an ACE/ARB/Aldactone.  Repeat echo in 3 months. 4. CKD stage IIIb: Check metabolic panel in 1 month  Overall Ms. Holaday appears to be stable  from a cardiovascular perspective.  She continues to have episodic angina with no change in pattern.  She has no new symptoms of heart failure.  I have recommended a metabolic panel in 1 month, and an echocardiogram and office visit in 3 months.  COVID-19 Education: The signs and  symptoms of COVID-19 were discussed with the patient and how to seek care for testing (follow up with PCP or arrange E-visit).  The importance of social distancing was discussed today.  Time:   Today, I have spent 15 minutes with the patient with telehealth technology discussing the above problems.     Medication Adjustments/Labs and Tests Ordered: Current medicines are reviewed at length with the patient today.  Concerns regarding medicines are outlined above.   Tests Ordered: No orders of the defined types were placed in this encounter.   Medication Changes: No orders of the defined types were placed in this encounter.   Follow Up:  In Person in 3 month(s)  Signed, Sherren Mocha, MD  04/06/2020 2:09 PM    [place]

## 2020-04-09 MED ORDER — HYDROCODONE-ACETAMINOPHEN 5-325 MG PO TABS: 1.0000 | ORAL_TABLET | ORAL | 0 refills | Status: AC | PRN

## 2020-04-09 NOTE — Telephone Encounter (Signed)
Sent in refill

## 2020-04-17 ENCOUNTER — Other Ambulatory Visit: Payer: Self-pay

## 2020-04-17 ENCOUNTER — Other Ambulatory Visit: Payer: Medicare Other | Admitting: *Deleted

## 2020-04-17 DIAGNOSIS — Z515 Encounter for palliative care: Secondary | ICD-10-CM

## 2020-04-17 NOTE — Progress Notes (Signed)
COMMUNITY PALLIATIVE CARE RN NOTE  PATIENT NAME: Brittney Gates DOB: 1924/05/20 MRN: [id number]  PRIMARY CARE PROVIDER: [name], MD  RESPONSIBLE PARTY: Pilar Jarvis (son) Acct ID - Guarantor Home Phone Work Phone Relationship Acct Type  [id number] RAI, [id number]  Self P/F     [address], [place], [place] [address]   Due to the COVID-19 crisis, this virtual check-in visit was done via telephone from my office and it was initiated and consent by this patient and or family.  PLAN OF CARE and INTERVENTION:  1. ADVANCE CARE PLANNING/GOALS OF CARE: Goal is for patient to remain in her home with her son. She has a DNR. 2. PATIENT/CAREGIVER EDUCATION: Symptom management, pain management, safe mobility/transfers, s/s of infection 3. DISEASE STATUS: Virtual check-in visit completed via telephone. Patient does have occasional chest pain. When this happens she becomes very anxious and afraid. She is given her Nitroglycerin tablet which usually helps. She also has issues with chronic pain in her spine, legs and feet. She continues to take Hydrocodone for mild to moderate pain and Oxycodone for severe pain. She always has pain but the medication takes the edge off. She remains ambulatory using her walker and is able to bath and dress herself. Her son [name] is always present for stand by assistance. She does require assistance at times with toileting when she is having issues with diarrhea, but other than that, she is able to do toilet herself. She does have some shortness of breath at times at rest and with exertion. She sleeps with her head of bed elevated to help with this at night. She feels that her abdomen is slightly distended, but denies any lower extremity edema. He is unable to weigh her daily because he feels it is too dangerous and afraid that she will fall. He tries to limit her sodium intake. She had an incident about a week after Christmas where he says she was  passing out on him and was becoming unresponsive. He gave her a Nitroglycerin tablet which he says seemed to make things worse. When she started to come around he was able to get her to drink 2 glasses of water and she seemed ok after this. He nor she want to get hospice back involved just yet because they say it affects her mentally and he does not want her to lose hope. Her appetite is ok. She is eating several small meals per day. He is concentrating on pushing fluids and keeping her hydrated because he says that she has never been one to drink a lot of fluids. Her son has had a bad bacterial infection but says they were both tested for Covid and the results were negative. He is thankful that she has not picked up any of his symptoms. Will continue to monitor.     HISTORY OF PRESENT ILLNESS: This is a 87 yo female with a history of heart failure with reduced EF, aortic stenosis, essential hypertension, hypothyroidism, osteoarthritis, CKD Stage 3, hyperlipidemia and anxiety. Palliative care team continues to follow patient and visits monthly and PRN.   CODE STATUS: DNR ADVANCED DIRECTIVES: Y MOST FORM:  no PPS: 50%   (Duration of visit and documentation 30 minutes)   Daryl Eastern, RN BSN

## 2020-05-01 ENCOUNTER — Other Ambulatory Visit: Payer: Medicare Other | Admitting: *Deleted

## 2020-05-01 ENCOUNTER — Other Ambulatory Visit: Payer: Self-pay

## 2020-05-01 DIAGNOSIS — I5032 Chronic diastolic (congestive) heart failure: Secondary | ICD-10-CM | POA: Diagnosis not present

## 2020-05-02 LAB — BASIC METABOLIC PANEL
BUN/Creatinine Ratio: 19 (ref 12–28)
BUN: 24 mg/dL (ref 10–36)
CO2: 26 mmol/L (ref 20–29)
Calcium: 9.7 mg/dL (ref 8.7–10.3)
Chloride: 103 mmol/L (ref 96–106)
Creatinine, Ser: 1.25 mg/dL — ABNORMAL HIGH (ref 0.57–1.00)
GFR calc Af Amer: 43 mL/min/{1.73_m2} — ABNORMAL LOW (ref >59)
GFR calc non Af Amer: 37 mL/min/{1.73_m2} — ABNORMAL LOW (ref >59)
Glucose: 95 mg/dL (ref 65–99)
Potassium: 4.4 mmol/L (ref 3.5–5.2)
Sodium: 144 mmol/L (ref 134–144)

## 2020-05-06 ENCOUNTER — Other Ambulatory Visit: Payer: Self-pay | Admitting: Internal Medicine

## 2020-05-08 ENCOUNTER — Other Ambulatory Visit: Payer: Self-pay

## 2020-05-08 ENCOUNTER — Other Ambulatory Visit: Payer: Medicare Other

## 2020-05-08 DIAGNOSIS — Z515 Encounter for palliative care: Secondary | ICD-10-CM

## 2020-05-08 NOTE — Telephone Encounter (Signed)
LR: 10-27-2019 Qty: 90 with 1 refill  Last office visit: 09-09-2019 Upcoming appointment: 05-31-2020

## 2020-05-10 DIAGNOSIS — J069 Acute upper respiratory infection, unspecified: Secondary | ICD-10-CM | POA: Diagnosis not present

## 2020-05-10 NOTE — Progress Notes (Signed)
COMMUNITY PALLIATIVE CARE SW NOTE  PATIENT NAME: Brittney Gates DOB: 07/08/1924 MRN: [id number]  PRIMARY CARE PROVIDER: [name], MD  RESPONSIBLE PARTY:  Acct ID - Guarantor Home Phone Work Phone Relationship Acct Type  [id number] KELLSEY, [phone]  Self P/F     [address], [place], [place] [address]   Due to the COVID-19 crisis, this virtual check-in visit was done via telephone from my office and it was initiated and consent by this patient and or family.   PLAN OF CARE and INTERVENTIONS:             1. GOALS OF CARE/ ADVANCE CARE PLANNING:  Goal is for patient to remain in her home, safely and independently. Patient is a DNR, forms left in the home. 1.  2. SOCIAL/EMOTIONAL/SPIRITUAL ASSESSMENT/ INTERVENTIONS:  SW completed a virtual check-in visit with patient via telephone. Patient's son/[facility]-Jeff provided a status update on patient. Merry Proud advised that patient has been having increased intermittent pain in her chest, increased difficulty breathing and heaviness to her chest. Merry Proud described patient having increased anxiety when she is in pain. This pain has been persistent, however her medication takes the edge off. She continues to ambulate with her walker and complete her own personal care and dressing with stand-by assistance. Patient is expecting her family to visit this weekend, which he is hoping to lift her spirits where she is not concentrating her pain. Merry Proud report no recent falls or emergency department visit. Her appetite remains fair as she is eating at least 2 good meals a day. Merry Proud is open to ongoing support and future visits from palliative care team.  2. PATIENT/CAREGIVER EDUCATION/ COPING:  Patient is generally alert and oriented x3, but is forgetful. She relies on her son to fill in information she is confused about or forget. 3.  4. PERSONAL EMERGENCY PLAN:  911 can be activated for emergencies. 5. COMMUNITY RESOURCES COORDINATION/ HEALTH  CARE NAVIGATION:  [facility] remains open to palliative care support.  6. FINANCIAL/LEGAL CONCERNS/INTERVENTIONS:  None.      SOCIAL HX:  Social History   Tobacco Use  . Smoking status: Never Smoker  . Smokeless tobacco: Never Used  Substance Use Topics  . Alcohol use: No    Alcohol/week: 0.0 standard drinks    CODE STATUS: DRN ADVANCED DIRECTIVES: Yes MOST FORM COMPLETE:  No HOSPICE EDUCATION PROVIDED: No  PPS: Patient is generally alert and oriented x3 with some forgetfulness; she ambulates with a walker, and is independent for personal care needs.  Duration of telephonic visit and documentation: 30 minutes.     [address] [place], [place]

## 2020-05-29 ENCOUNTER — Other Ambulatory Visit: Payer: Medicare Other

## 2020-05-29 ENCOUNTER — Telehealth: Payer: Self-pay | Admitting: Pharmacist

## 2020-05-29 ENCOUNTER — Other Ambulatory Visit: Payer: Self-pay

## 2020-05-29 DIAGNOSIS — Z515 Encounter for palliative care: Secondary | ICD-10-CM

## 2020-05-29 NOTE — Progress Notes (Signed)
Chronic Care Management Pharmacy Assistant   Name: Brittney Gates  MRN: [id number] DOB: 1924/06/16  Reason for Encounter: Initial Questions   PCP : [name], MD  Allergies:   Allergies  Allergen Reactions  . Statins Other (See Comments)    Leg weakness  . Lidocaine     Per patient  . Metamucil [Psyllium]     Difficulty swallowing after taking    Medications: Outpatient Encounter Medications as of 05/29/2020  Medication Sig  . acetaminophen (TYLENOL) 325 MG tablet Take 325 mg by mouth every 6 (six) hours as needed for mild pain, moderate pain or headache.  Marland Kitchen aspirin EC 81 MG tablet Take 81 mg by mouth daily.  . Cholecalciferol 2000 UNITS TABS Take 2,000 Units by mouth daily.  . furosemide (LASIX) 40 MG tablet Take 1 tablet (40 mg total) by mouth daily.  Marland Kitchen HYDROcodone-acetaminophen (NORCO/VICODIN) 5-325 MG tablet Take 1 tablet by mouth every 4 (four) hours as needed for moderate pain.  Marland Kitchen KLOR-CON SPRINKLE 10 MEQ CR capsule Take 1 capsule (10 mEq total) by mouth daily.  Marland Kitchen levothyroxine (SYNTHROID) 100 MCG tablet TAKE ONE TABLET BY MOUTH EVERY MORNING BEFORE BREAKFAST  . Multiple Vitamin (MULTIVITAMIN WITH MINERALS) TABS tablet Take 1 tablet by mouth daily.  . nitroGLYCERIN (NITROSTAT) 0.4 MG SL tablet Place 1 tablet (0.4 mg total) under the tongue every 5 (five) minutes as needed for up to 30 doses for chest pain.  [name] 3-6-9 Fatty Acids (TRIPLE OMEGA-3-6-9 PO) Take 1 capsule by mouth daily.  Marland Kitchen oxyCODONE (OXY IR/ROXICODONE) 5 MG immediate release tablet Take 5 mg by mouth every 4 (four) hours as needed for severe pain.  . pregabalin (LYRICA) 50 MG capsule TAKE ONE CAPSULE BY MOUTH TWICE A DAY  . sennosides-docusate sodium (SENOKOT-S) 8.6-50 MG tablet Take 2 tablets by mouth daily as needed for constipation.   Marland Kitchen zolpidem (AMBIEN) 10 MG tablet TAKE ONE TABLET BY MOUTH EVERY NIGHT AT BEDTIME AS NEEDED FOR SLEEP   No facility-administered encounter medications on file  as of 05/29/2020.    Current Diagnosis: Patient Active Problem List   Diagnosis Date Noted  . AKI (acute kidney injury) ([facility]) 10/08/2019  . CKD (chronic kidney disease) stage 3, GFR 30-59 ml/min ([facility]) 10/08/2019  . CAD (coronary artery disease) 10/08/2019  . Acute HFrEF (heart failure with reduced ejection fraction) ([facility]) 10/08/2019  . Unstable angina ([facility])   . Left chest pressure 10/06/2019  . Aortic stenosis 10/06/2019  . Fever 11/25/2018  . Hand numbness 09/07/2018  . Weight gain 11/20/2017  . Ankle swelling 08/08/2016  . Spondylosis of lumbar spine 05/05/2016  . Palliative care by specialist 04/30/2016  . Lumbosacral radiculopathy at L5   . Sacral insufficiency fracture with delayed healing   . Lumbar radicular pain 04/28/2016  . Radicular low back pain 04/28/2016  . Routine general medical examination at a health care facility 11/01/2015  . Abnormal CT scan, chest 05/06/2012  . Osteoporosis 04/30/2012  . Hip fracture left 01/20/2012  . PVD (peripheral vascular disease) ([facility])   . ANEMIA 04/24/2010  . Hypothyroidism 08/14/2007  . Hyperlipidemia 08/14/2007  . ANXIETY 08/14/2007  . Essential hypertension 08/14/2007  . Osteoarthritis 08/14/2007  . Lumbar back pain with radiculopathy affecting right lower extremity 08/14/2007    Goals Addressed   None     Follow-Up:  Pharmacist Review   Have you seen any other providers since your last visit? The patient has seen Dr. Burt Knack a few times  since last appointment with Dr. Sharlet Salina  Any changes in your medications or health? The patient states that there has been no medication changes  Any side effects from any medications? The patient states that she has not had any side effects from medications  Do you have an symptoms or problems not managed by your medications? The patient states tat she does not have any symptoms or problems not managed by medications  Any concerns about your health right now? The patient states that  she does not have any concerns about her health at this time  Has your provider asked that you check blood pressure, blood sugar, or follow special diet at home? The patient states that her son [name]) checks her blood pressure occasionally and it is always good and she is not on any special diet  Do you get any type of exercise on a regular basis? The patient states that she does arm and legs exercises herself and that her and hr son get out of the house to walk every now and then  Can you think of a goal you would like to reach for your health? The patient states to stay healthy as she can  Do you have any problems getting your medications? The patient states that she does not have any problems with getting medications from the pharmacy  Is there anything that you would like to discuss during the appointment? The patient states that she doe not have anything specific to discuss at this time  Patient is to have a televisit and asked to have medications and supplements at appointment    Time spent:30

## 2020-05-29 NOTE — Progress Notes (Signed)
COMMUNITY PALLIATIVE CARE SW NOTE  PATIENT NAME: Brittney Gates DOB: [birth year] MRN: [id number]  PRIMARY CARE PROVIDER: [name], MD  RESPONSIBLE PARTY:  Acct ID - Guarantor Home Phone Work Phone Relationship Acct Type  [id number] Brittney Gates, [name]  Self P/F     [address], [place], [place] [address]     PLAN OF CARE and INTERVENTIONS:             1. GOALS OF CARE/ ADVANCE CARE PLANNING:  Goal is for patient to remain in her home, safely and independently. Patient is a DNR, forms left in the home. 1. SOCIAL/EMOTIONAL/SPIRITUAL ASSESSMENT/ INTERVENTIONS: SW completed a face-to-face with patient at her home where she was present with her son-Brittney Gates. Patient denied pain and felt that she was doing well overall. She and her son provided an update on her status. Patient and her son described her having "spells". She described having feelings of heaviness in her chest with intermittent pain. She takes nitrogen which helps, but lately she has had to take two nitrogens. The nitrogen does lower her blood pressure which causes some weakness, but is effective. Her son reported that patient has episodes where her 51 will drop to the lower 40/50 range during these "spells" also. Her appetite remains good, her most recent weight is 167 lbs. She has ongoing generalized back, feet and leg pain. She ambulates with her rolling walker and have had no falls. She is sleeping good at night. Patient continues to spend time reading her Bible, doing puzzles, playing games with her son and watching TV. Patient is engaged in life review, sharing stories about her early years of marriage and working. SW provided active listening, supportive presence, reinforced education regarding disease progression, reassurance of support. Patient and her son are open to ongoing support support.  2. PATIENT/CAREGIVER EDUCATION/ COPING:  Patient and her son are coping well.  3. PERSONAL EMERGENCY PLAN:  911 can  be activated for emergencies.  4. COMMUNITY RESOURCES COORDINATION/ HEALTH CARE NAVIGATION: None 5. FINANCIAL/LEGAL CONCERNS/INTERVENTIONS:  None.     SOCIAL HX:  Social History   Tobacco Use  . Smoking status: Never Smoker  . Smokeless tobacco: Never Used  Substance Use Topics  . Alcohol use: No    Alcohol/week: 0.0 standard drinks    CODE STATUS: DNR ADVANCED DIRECTIVES: Yes MOST FORM COMPLETE:  No HOSPICE EDUCATION PROVIDED: No  PPS:  Patient isgenerallyalert and oriented x3 with some forgetfulness; she ambulates with a walker, and is independent for personal care needs.  Duration ofvisit and documentation:60 minutes.      [address] [place], [place]

## 2020-05-31 ENCOUNTER — Ambulatory Visit (INDEPENDENT_AMBULATORY_CARE_PROVIDER_SITE_OTHER): Payer: Medicare Other | Admitting: Pharmacist

## 2020-05-31 ENCOUNTER — Other Ambulatory Visit: Payer: Self-pay

## 2020-05-31 DIAGNOSIS — I2510 Atherosclerotic heart disease of native coronary artery without angina pectoris: Secondary | ICD-10-CM

## 2020-05-31 DIAGNOSIS — I10 Essential (primary) hypertension: Secondary | ICD-10-CM | POA: Diagnosis not present

## 2020-05-31 DIAGNOSIS — I5020 Unspecified systolic (congestive) heart failure: Secondary | ICD-10-CM

## 2020-05-31 DIAGNOSIS — E785 Hyperlipidemia, unspecified: Secondary | ICD-10-CM | POA: Diagnosis not present

## 2020-05-31 DIAGNOSIS — I200 Unstable angina: Secondary | ICD-10-CM

## 2020-05-31 DIAGNOSIS — I2583 Coronary atherosclerosis due to lipid rich plaque: Secondary | ICD-10-CM

## 2020-05-31 DIAGNOSIS — E039 Hypothyroidism, unspecified: Secondary | ICD-10-CM

## 2020-05-31 DIAGNOSIS — I350 Nonrheumatic aortic (valve) stenosis: Secondary | ICD-10-CM

## 2020-05-31 DIAGNOSIS — M5416 Radiculopathy, lumbar region: Secondary | ICD-10-CM

## 2020-05-31 NOTE — Progress Notes (Signed)
Chronic Care Management Pharmacy Note  05/31/2020 Name:  Brittney Gates MRN:  [id number] DOB:  31-Jul-1924  Subjective: Brittney Gates is an 87 y.o. year old female who is a primary patient of [name], MD.  The CCM team was consulted for assistance with disease management and care coordination needs.    Engaged with patient by telephone for initial visit in response to provider referral for pharmacy case management and/or care coordination services.   Consent to Services:  The patient was given the following information about Chronic Care Management services today, agreed to services, and gave verbal consent: 1. CCM service includes personalized support from designated clinical staff supervised by the primary care provider, including individualized plan of care and coordination with other care providers 2. 24/7 contact phone numbers for assistance for urgent and routine care needs. 3. Service will only be billed when office clinical staff spend 20 minutes or more in a month to coordinate care. 4. Only one practitioner may furnish and bill the service in a calendar month. 5.The patient may stop CCM services at any time (effective at the end of the month) by phone call to the office staff. 6. The patient will be responsible for cost sharing (co-pay) of up to 20% of the service fee (after annual deductible is met). Patient agreed to services and consent obtained.  Patient Care Team: [name], MD as PCP - General (Internal Medicine) Sherren Mocha, MD as PCP - Cardiology (Cardiology) Sherren Mocha, MD as Consulting Physician (Cardiology) Newt Minion, MD as Consulting Physician (Orthopedic Surgery) Jenne Campus, MD as Consulting Physician (Neurosurgery) Calvert Cantor, MD as Consulting Physician (Ophthalmology) Carolan Clines, MD (Inactive) as Consulting Physician (Urology) Bo Merino, MD (Rheumatology) Rozetta Nunnery, MD (Otolaryngology) Lavonna Monarch, MD as Consulting Physician (Dermatology) Charlton Haws, [facility] as Pharmacist (Pharmacist)  Recent office visits: 09/09/19 Dr Sharlet Salina OV: hand numbness. Referred to Dr Amedeo Plenty  Recent consult visits: 05/29/20 Palliatve care home visit: pt using NTG more often  04/06/20 Dr Burt Knack [facility] (cardiology): f/u CAD. Episodic angina. No med changes.  Hospital visits: None in previous 6 months  Objective:  Lab Results  Component Value Date   CREATININE 1.25 (H) 05/01/2020   BUN 24 05/01/2020   GFR 45.88 (L) 09/09/2019   GFRNONAA 37 (L) 05/01/2020   GFRAA 43 (L) 05/01/2020   NA 144 05/01/2020   K 4.4 05/01/2020   CALCIUM 9.7 05/01/2020   CO2 26 05/01/2020    Lab Results  Component Value Date/Time   HGBA1C 6.0 03/29/2010 12:00 AM   GFR 45.88 (L) 09/09/2019 02:51 PM   GFR 52.23 (L) 07/23/2017 04:25 PM    Last diabetic Eye exam:  Lab Results  Component Value Date/Time   HMDIABEYEEXA Normal 03/31/2009 12:00 AM    Last diabetic Foot exam: No results found for: HMDIABFOOTEX   Lab Results  Component Value Date   CHOL 214 (H) 09/09/2019   HDL 51.00 09/09/2019   LDLCALC 135 (H) 09/09/2019   LDLDIRECT 143.1 12/08/2012   TRIG 141.0 09/09/2019   CHOLHDL 4 09/09/2019    Hepatic Function Latest Ref Rng & Units 10/05/2019 09/09/2019 07/23/2017  Total Protein 6.5 - 8.1 g/dL 6.8 7.3 7.0  Albumin 3.5 - 5.0 g/dL 3.7 4.4 4.1  AST 15 - 41 U/L 19 17 14   ALT 0 - 44 U/L 15 12 12   Alk Phosphatase 38 - 126 U/L 61 60 66  Total Bilirubin 0.3 - 1.2 mg/dL 0.5 0.6 0.4  Bilirubin, Direct 0.0 - 0.3 mg/dL - - -    Lab Results  Component Value Date/Time   TSH 4.157 10/09/2019 11:22 AM   TSH 2.97 09/09/2019 02:51 PM   TSH 1.61 05/19/2018 02:35 PM   FREET4 1.04 07/23/2017 04:25 PM   FREET4 1.18 10/20/2016 05:24 PM    CBC Latest Ref Rng & Units 10/11/2019 10/10/2019 10/09/2019  WBC 4.0 - 10.5 K/uL 6.7 7.3 7.0  Hemoglobin 12.0 - 15.0 g/dL 9.8(L) 10.2(L) 10.4(L)  Hematocrit 36.0 - 46.0 % 29.5(L)  30.8(L) 31.5(L)  Platelets 150 - 400 K/uL 137(L) 112(L) 123(L)   Iron/TIBC/Ferritin/ %Sat    Component Value Date/Time   IRON 33 (L) 05/15/2013 0950   TIBC 221 (L) 05/15/2013 0950   FERRITIN 273 05/15/2013 0950   IRONPCTSAT 15 (L) 05/15/2013 0950    Lab Results  Component Value Date/Time   VD25OH 30.89 07/23/2017 04:25 PM    Clinical ASCVD: Yes  The ASCVD Risk score Mikey Bussing DC Jr., et al., 2013) failed to calculate for the following reasons:   The 2013 ASCVD risk score is only valid for ages 7 to 16    Depression screen PHQ 2/9 09/06/2019 09/06/2018 12/24/2016  Decreased Interest 0 0 0  Down, Depressed, Hopeless 0 0 0  PHQ - 2 Score 0 0 0  Some recent data might be hidden     Social History   Tobacco Use  Smoking Status Never Smoker  Smokeless Tobacco Never Used   BP Readings from Last 3 Encounters:  04/06/20 (!) 166/57  01/24/20 (!) 127/54  01/04/20 130/62   Pulse Readings from Last 3 Encounters:  04/06/20 62  01/24/20 65  01/04/20 91   Wt Readings from Last 3 Encounters:  04/06/20 167 lb (75.8 kg)  01/04/20 167 lb 6.4 oz (75.9 kg)  10/19/19 175 lb (79.4 kg)    Assessment/Interventions: Review of patient past medical history, allergies, medications, health status, including review of consultants reports, laboratory and other test data, was performed as part of comprehensive evaluation and provision of chronic care management services.   SDOH:  (Social Determinants of Health) assessments and interventions performed: Yes SDOH Interventions   Flowsheet Row Most Recent Value  SDOH Interventions   Financial Strain Interventions Intervention Not Indicated      SDOH Screenings   Alcohol Screen: Not on file  Depression (PHQ2-9): Low Risk   . PHQ-2 Score: 0  Financial Resource Strain: Low Risk   . Difficulty of Paying Living Expenses: Not hard at all  Food Insecurity: Not on file  Housing: Not on file  Physical Activity: Not on file  Social Connections: Not on  file  Stress: Not on file  Tobacco Use: Low Risk   . Smoking Tobacco Use: Never Smoker  . Smokeless Tobacco Use: Never Used  Transportation Needs: Not on file    CCM Care Plan  Allergies  Allergen Reactions  . Statins Other (See Comments)    Leg weakness  . Lidocaine     Per patient  . Metamucil [Psyllium]     Difficulty swallowing after taking    Medications Reviewed Today    Reviewed by Charlton Haws, [facility] (Pharmacist) on 05/31/20 at 1441  Med List Status: <None>  Medication Order Taking? Sig Documenting Provider Last Dose Status Informant  acetaminophen (TYLENOL) 325 MG tablet [id number] Yes Take 325 mg by mouth every 6 (six) hours as needed for mild pain, moderate pain or headache. [provider] Taking Active Family Member  aspirin EC  81 MG tablet [id number] Yes Take 81 mg by mouth daily. [provider] Taking Active Family Member  Cholecalciferol 2000 UNITS TABS [id number] Yes Take 2,000 Units by mouth daily. [provider] Taking Active Family Member  furosemide (LASIX) 40 MG tablet [id number] Yes Take 1 tablet (40 mg total) by mouth daily. Sherren Mocha, MD Taking Active   HYDROcodone-acetaminophen (NORCO/VICODIN) 5-325 MG tablet [id number] Yes Take 1 tablet by mouth every 4 (four) hours as needed for moderate pain. [name], MD Taking Active   KLOR-CON SPRINKLE 10 MEQ CR capsule [id number] Yes Take 1 capsule (10 mEq total) by mouth daily. Sherren Mocha, MD Taking Active   levothyroxine (SYNTHROID) 100 MCG tablet [id number] Yes TAKE ONE TABLET BY MOUTH EVERY MORNING BEFORE BREAKFAST [name], MD Taking Active   Multiple Vitamin (MULTIVITAMIN WITH MINERALS) TABS tablet [id number] Yes Take 1 tablet by mouth daily. [provider] Taking Active Family Member  nitroGLYCERIN (NITROSTAT) 0.4 MG SL tablet [id number] Yes Place 1 tablet (0.4 mg total) under the tongue every 5 (five) minutes as needed for up to 30 doses for  chest pain. Cherylann Ratel A, DO Taking Active   Omega 3-6-9 Fatty Acids (TRIPLE OMEGA-3-6-9 PO) [id number] Yes Take 1 capsule by mouth daily. [provider] Taking Active Family Member        Discontinued 05/31/20 1441 (Completed Course)   pregabalin (LYRICA) 50 MG capsule [id number] Yes TAKE ONE CAPSULE BY MOUTH TWICE A DAY [name], MD Taking Active   sennosides-docusate sodium (SENOKOT-S) 8.6-50 MG tablet [id number] Yes Take 2 tablets by mouth daily as needed for constipation.  [provider] Taking Active Family Member  zolpidem (AMBIEN) 10 MG tablet [id number] Yes TAKE ONE TABLET BY MOUTH EVERY NIGHT AT BEDTIME AS NEEDED FOR SLEEP [name], MD Taking Active           Patient Active Problem List   Diagnosis Date Noted  . AKI (acute kidney injury) ([place]) 10/08/2019  . CKD (chronic kidney disease) stage 3, GFR 30-59 ml/min ([facility]) 10/08/2019  . CAD (coronary artery disease) 10/08/2019  . Acute HFrEF (heart failure with reduced ejection fraction) ([place]) 10/08/2019  . Unstable angina ([place])   . Left chest pressure 10/06/2019  . Aortic stenosis 10/06/2019  . Fever 11/25/2018  . Hand numbness 09/07/2018  . Weight gain 11/20/2017  . Ankle swelling 08/08/2016  . Spondylosis of lumbar spine 05/05/2016  . Palliative care by specialist 04/30/2016  . Lumbosacral radiculopathy at L5   . Sacral insufficiency fracture with delayed healing   . Lumbar radicular pain 04/28/2016  . Radicular low back pain 04/28/2016  . Routine general medical examination at a health care facility 11/01/2015  . Abnormal CT scan, chest 05/06/2012  . Osteoporosis 04/30/2012  . Hip fracture left 01/20/2012  . PVD (peripheral vascular disease) ([place])   . ANEMIA 04/24/2010  . Hypothyroidism 08/14/2007  . Hyperlipidemia 08/14/2007  . ANXIETY 08/14/2007  . Essential hypertension 08/14/2007  . Osteoarthritis 08/14/2007  . Lumbar back pain with radiculopathy affecting right lower  extremity 08/14/2007    Immunization History  Administered Date(s) Administered  . Influenza Split 12/04/2011, 12/13/2012, 12/12/2013  . Influenza, High Dose Seasonal PF 12/03/2015, 11/14/2018  . Influenza-Unspecified 11/29/2013, 12/09/2014, 11/29/2017  . PFIZER(Purple Top)SARS-COV-2 Vaccination 06/30/2019, 07/21/2019, 01/04/2020  . Pneumococcal Conjugate-13 12/30/2013  . Pneumococcal Polysaccharide-23 03/31/2009  . Td 03/31/2001, 12/30/2013    Conditions to be addressed/monitored:  Hypertension, Hyperlipidemia, Heart Failure and Coronary Artery Disease, Aortic stenosis  Care Plan : [place]  Updates made by Charlton Haws, RPH since 05/31/2020 12:00 AM    Problem: Hypertension, Hyperlipidemia, Coronary artery disease and Heart Failure, Aortic stenosis   Priority: High    Long-Range Goal: Disease management   Start Date: 05/31/2020  Expected End Date: 12/01/2020  This Visit's Progress: On track  Priority: High  Note:   Current Barriers:  . Unable to independently monitor therapeutic efficacy . Unable to maintain control of angina  Pharmacist Clinical Goal(s):  Marland Kitchen Over the next 30 days, patient will achieve adherence to monitoring guidelines and medication adherence to achieve therapeutic efficacy . maintain control of angina as evidenced by patient report  through collaboration with PharmD and provider.   Interventions: . 1:1 collaboration with [name], MD regarding development and update of comprehensive plan of care as evidenced by provider attestation and co-signature . Inter-disciplinary care team collaboration (see longitudinal plan of care) . Comprehensive medication review performed; medication list updated in electronic medical record  Heart Failure / HTN (Goal: BP < 140/90, maintain euvolemia) -Controlled - patient denies swelling and reports BP is controlled at home -per cardiologist, no ARB or beta blocker due to severe aortic  stenosis -Last ejection fraction: 35-40% (Date: 12/28/2019) -HF type: Systolic -NYHA Class: III (marked limitation of activity) -Current treatment: . Furosemide 40 mg daily . K[place]le 10 mEq daily -Educated on Benefits of medications for managing symptoms and prolonging life Proper diuretic administration and potassium supplementation  -Discussed weighing daily - pt has trouble balancing on scale, due to risk for falls advised against weighing daily -Recommended to continue current medication  Hyperlipidemia / CAD (LDL goal < 70) -Not ideally controlled - LDL above goal however pt is statin-intolerant; she is reporting using nitroglycerin nearly every day for chest "heaviness" and shortness of breath (denies chest pain); she does report BP drops when she takes NTG but chest heaviness/SOB resolves -Cardiac cath 09/2019 showed single vessel CAD, medically managed -Current treatment: . Nitroglycerin 0.4 mg SL prn . Aspirin 81 mg daily . Triple Omega 3-6-9 daily -Medications previously tried: statins (leg weakness)  -Educated on mechanism of nitroglycerin and risks of hypotension with use; normally nitrates are relatively contraindicated in aortic stenosis due to theoretical (but unproven) risk for hypotension, however given frequency of anginal symptoms and improvement w/ NTG use, pt may actually benefit from long acting nitrate (isosorbide) -Recommend follow up with cardiologist to discuss increased frequency of anginal symptoms and use of nitroglycerin; may consider low-dose isosorbide  Hypothyroidism (Goal: maintain TSH in goal range) -Controlled -Current treatment  . Levothyroxine 100 mcg daily -Patient is satisfied with current regimen and denies issues -Recommended to continue current medication  Chronic pain (Goal: manage symptoms) -Controlled  -lumbar radiculopathy, osteoarthritis -Current treatment  . Pregabalin 50 mg BID - 1 in evening  . Tylenol 325 mg  PRN . Hydrocodone-APAP 5-325 mg PRN - a few times a week -Medications previously tried: oxycodone  -Patient is satisfied with current regimen and denies issues -Recommended to continue current medication  Insomnia (Goal: improve sleep) -Controlled - pt is taking zolpidem daily and reports good quality sleep -Current treatment  . Zolpidem 10 mg HS  -Patient is satisfied with current regimen and denies issues -Recommended to continue current medication  Health Maintenance -Current therapy:  Marland Kitchen Vitamin D 2000 IU daily . Multivitamin . Senna-docusate - 2 tabs HS -Patient is satisfied with current therapy and denies issues -Recommended to continue current medication  Patient  Goals/Self-Care Activities . Over the next 30 days, patient will:  - take medications as prescribed focus on medication adherence by pill box check blood pressure regularly, document, and provide at future appointments  Follow Up Plan: Telephone follow up appointment with care management team member scheduled for: 1 month      Medication Assistance: None required.  Patient affirms current coverage meets needs.  Patient's preferred pharmacy is:  [facility], [address] [place] [address] Phone: [phone] Fax: [phone]  Uses pill box? Yes Pt endorses 100% compliance  We discussed: Current pharmacy is preferred with insurance plan and patient is satisfied with pharmacy services Patient decided to: Continue current medication management strategy  Care Plan and Follow Up Patient Decision:  Patient agrees to Care Plan and Follow-up.  Plan: Telephone follow up appointment with care management team member scheduled for:  1 month  Charlene Brooke, PharmD, [facility] Clinical Pharmacist [facility] [phone]

## 2020-05-31 NOTE — Progress Notes (Signed)
Yes - I would try Imdur 15 mg x 1 week, then increase to 30 mg daily if needed. Thank you

## 2020-05-31 NOTE — Patient Instructions (Addendum)
Visit Information  Phone number for Pharmacist: [phone]  Thank you for meeting with me to discuss your medications! I look forward to working with you to achieve your health care goals. Below is a summary of what we talked about during the visit:  Goals Addressed            This Visit's Progress   . Track and Manage My Blood Pressure-Hypertension       Timeframe:  Long-Range Goal Priority:  High Start Date:      05/31/20                       Expected End Date:         12/01/20              Follow Up Date 07/03/20   - check blood pressure 3 times per week - write blood pressure results in a log or diary  - check BP when feeling dizzy or lightheaded, or immediately following Nitroglycerin use -Follow up with cardiologist regarding increased use of Nitroglycerin   Why is this important?    You won't feel high blood pressure, but it can still hurt your blood vessels.   High blood pressure can cause heart or kidney problems. It can also cause a stroke.   Making lifestyle changes like losing a little weight or eating less salt will help.   Checking your blood pressure at home and at different times of the day can help to control blood pressure.   If the doctor prescribes medicine remember to take it the way the doctor ordered.   Call the office if you cannot afford the medicine or if there are questions about it.     Notes:       Patient Care Plan: CCM Pharmacy Care Plan    Problem Identified: Hypertension, Hyperlipidemia, Coronary artery disease and Heart Failure, Aortic stenosis   Priority: High    Long-Range Goal: Disease management   Start Date: 05/31/2020  Expected End Date: 12/01/2020  This Visit's Progress: On track  Priority: High  Note:   Current Barriers:  . Unable to independently monitor therapeutic efficacy . Unable to maintain control of angina  Pharmacist Clinical Goal(s):  Marland Kitchen Over the next 30 days, patient will achieve adherence to monitoring guidelines  and medication adherence to achieve therapeutic efficacy . maintain control of angina as evidenced by patient report  through collaboration with PharmD and provider.   Interventions: . 1:1 collaboration with [name], MD regarding development and update of comprehensive plan of care as evidenced by provider attestation and co-signature . Inter-disciplinary care team collaboration (see longitudinal plan of care) . Comprehensive medication review performed; medication list updated in electronic medical record  Heart Failure / HTN (Goal: BP < 140/90, maintain euvolemia) -Controlled - patient denies swelling and reports BP is controlled at home -per cardiologist, no ARB or beta blocker due to severe aortic stenosis -Last ejection fraction: 35-40% (Date: 12/28/2019) -HF type: Systolic -NYHA Class: III (marked limitation of activity) -Current treatment: . Furosemide 40 mg daily . Klor-Con springle 10 mEq daily -Educated on Benefits of medications for managing symptoms and prolonging life Proper diuretic administration and potassium supplementation  -Discussed weighing daily - pt has trouble balancing on scale, due to risk for falls advised against weighing daily -Recommended to continue current medication  Hyperlipidemia / CAD (LDL goal < 70) -Not ideally controlled - LDL above goal however pt is statin-intolerant; she is reporting  using nitroglycerin nearly every day for chest "heaviness" and shortness of breath (denies chest pain); she does report BP drops when she takes NTG but chest heaviness/SOB resolves -Cardiac cath 09/2019 showed single vessel CAD, medically managed -Current treatment: . Nitroglycerin 0.4 mg SL prn . Aspirin 81 mg daily . Triple Omega 3-6-9 daily -Medications previously tried: statins (leg weakness)  -Educated on mechanism of nitroglycerin and risks of hypotension with use; normally nitrates are relatively contraindicated in aortic stenosis due to  theoretical (but unproven) risk for hypotension, however given frequency of anginal symptoms and improvement w/ NTG use, pt may actually benefit from long acting nitrate (isosorbide) -Recommend follow up with cardiologist to discuss increased frequency of anginal symptoms and use of nitroglycerin; may consider low-dose isosorbide  Hypothyroidism (Goal: maintain TSH in goal range) -Controlled -Current treatment  . Levothyroxine 100 mcg daily -Patient is satisfied with current regimen and denies issues -Recommended to continue current medication  Chronic pain (Goal: manage symptoms) -Controlled  -lumbar radiculopathy, osteoarthritis -Current treatment  . Pregabalin 50 mg BID - 1 in evening  . Tylenol 325 mg PRN . Hydrocodone-APAP 5-325 mg PRN - a few times a week -Medications previously tried: oxycodone  -Patient is satisfied with current regimen and denies issues -Recommended to continue current medication  Insomnia (Goal: improve sleep) -Controlled - pt is taking zolpidem daily and reports good quality sleep -Current treatment  . Zolpidem 10 mg HS  -Patient is satisfied with current regimen and denies issues -Recommended to continue current medication  Health Maintenance -Current therapy:  Marland Kitchen Vitamin D 2000 IU daily . Multivitamin . Senna-docusate - 2 tabs HS -Patient is satisfied with current therapy and denies issues -Recommended to continue current medication  Patient Goals/Self-Care Activities . Over the next 30 days, patient will:  - take medications as prescribed focus on medication adherence by pill box check blood pressure regularly, document, and provide at future appointments  Follow Up Plan: Telephone follow up appointment with care management team member scheduled for: 1 month      Brittney Gates was given information about Chronic Care Management services today including:  1. CCM service includes personalized support from designated clinical staff supervised by  her physician, including individualized plan of care and coordination with other care providers 2. 24/7 contact phone numbers for assistance for urgent and routine care needs. 3. Standard insurance, coinsurance, copays and deductibles apply for chronic care management only during months in which we provide at least 20 minutes of these services. Most insurances cover these services at 100%, however patients may be responsible for any copay, coinsurance and/or deductible if applicable. This service may help you avoid the need for more expensive face-to-face services. 4. Only one practitioner may furnish and bill the service in a calendar month. 5. The patient may stop CCM services at any time (effective at the end of the month) by phone call to the office staff.  Patient agreed to services and verbal consent obtained.   The patient verbalized understanding of instructions, educational materials, and care plan provided today and agreed to receive a mailed copy of patient instructions, educational materials, and care plan.  Telephone follow up appointment with pharmacy team member scheduled for: 1 month  Charlene Brooke, PharmD, BCACP Clinical Pharmacist [facility] [phone]  Angina  Angina is discomfort in the chest, neck, arm, jaw, or back. The discomfort is caused by a lack of blood in the middle layer of the heart wall (myocardium). There are  four types of angina:  Stable angina. This is triggered by vigorous activity or exercise. It goes away when you rest or take medicines that treat angina. This is diagnosed if you have had the symptom for more than 2 months.  Unstable angina. This is a warning sign and can lead to a heart attack. This is a medical emergency. Symptoms come at rest and last a long time.  Microvascular angina. This affects the small coronary arteries. Symptoms include chest pain, feeling tired, and being short of breath. The symptoms can last  a long time or short time.  Prinzmetal or variant angina. This is caused by a spasm of the arteries that go to your heart. What are the causes? This condition is usually caused by atherosclerosis. This is the buildup of fat and cholesterol (plaque) in your arteries. The plaque may narrow or block the artery. Other causes of angina include:  Sudden spasms of the muscles of the arteries in the heart.  Small artery disease (microvascular dysfunction).  Problems with any of your heart valves.  A tear in an artery in your heart (coronary artery dissection).  Weakness of the heart muscle (cardiomyopathy). What increases the risk? You are more likely to develop this condition if you have:  High cholesterol.  High blood pressure.  Diabetes.  A family history of heart disease.  A sedentary lifestyle, or a lifestyle in which you do not exercise enough.  Depression.  Had radiation treatment to the left side of your chest. Other risk factors include:  Using tobacco.  Being obese.  Eating a diet high in saturated fats.  Being exposed to high stress or triggers of stress.  Using drugs, such as cocaine. Women have a greater risk for angina if they:  Are older than age 51.  Have gone through menopause. What are the signs or symptoms? Common symptoms of this condition in both men and women may include:  Chest pain, which may: ? Feel like a crushing or squeezing in the chest, or a tightness, pressure, fullness, or heaviness in the chest. ? Last for more than a few minutes, or stop and come back over a few minutes.  Pain in the neck, arm, jaw, or back.  Unexplained heartburn or indigestion.  Shortness of breath.  Nausea.  Sudden cold sweats. Women and people with diabetes may have unusual (atypical) symptoms, such as:  Fatigue.  Unexplained feelings of nervousness or anxiety.  Unexplained weakness.  Dizziness or fainting. How is this diagnosed? This condition  may be diagnosed based on:  Your symptoms and medical history.  Electrocardiogram (ECG) to measure the electrical activity in your heart.  Blood tests.  Stress test to look for signs of blockage when your heart is stressed.  CT angiogram to examine your heart and the blood flow to it.  Coronary angiogram to check for arterial blockage.  Echocardiogram (ultrasound) to assess the strength of your heartbeat. How is this treated? Angina may be treated with:  Medicines to: ? Prevent blood clots and heart attack. ? Relax blood vessels and improve blood flow to the heart. ? Reduce blood pressure, improve heart pumping, and relax blood vessels spasms. ? Reduce cholesterol and help treat atherosclerosis.  A procedure to widen a narrowed or blocked coronary artery (angioplasty). A mesh tube (stent) may be placed in a coronary artery to keep it open.  Surgery to allow blood to go around a blocked artery (coronary artery bypass surgery). Follow these instructions at home: Medicines  Take over-the-counter and prescription medicines only as told by your health care provider.  Do not take the following medicines unless your health care provider approves: ? NSAIDs, such as ibuprofen or naproxen. ? Vitamin supplements that contain vitamin A, vitamin E, or both. ? Hormone replacement therapy that contains estrogen with or without progestin. Eating and drinking  Eat a heart-healthy diet. This includes plenty of fresh fruits and vegetables, whole grains, low-fat (lean) protein, and low-fat dairy products.  Follow instructions from your health care provider about eating or drinking restrictions.   Activity  Follow an exercise program approved by your health care provider.  Consider joining a cardiac rehabilitation program.  Take a break when you feel fatigued. Plan rest periods in your daily activities. Lifestyle  Do not use any products that contain nicotine or tobacco. These products  include cigarettes, chewing tobacco, and vaping devices, such as e-cigarettes. If you need help quitting, ask your health care provider.  If your health care provider says you can drink alcohol: ? Limit how much you have to:  0-1 drink a day for women who are not pregnant.  0-2 drinks a day for men. ? Be aware of how much alcohol is in your drink. In the [place] one drink equals one 12 oz bottle of beer (355 mL), one 5 oz glass of wine (148 mL), or one 1 oz glass of hard liquor (44 mL).   General instructions  Maintain a healthy weight.  Learn to manage stress.  Keep your vaccinations up to date. Get the flu (influenza) vaccine every year.  Talk to your health care provider if you feel depressed. Take a depression screening test to see if you are at risk for depression.  Work with your health care provider to manage other health conditions, such as hypertension or diabetes.  Keep all follow-up visits. This is important. Get help right away if:  You have pain in your chest, neck, arm, jaw, or back, and the pain: ? Lasts more than a few minutes. ? Is recurring. ? Is not relieved by taking medicines under the tongue (sublingual nitroglycerin). ? Increases in intensity or frequency.  You have a lot of sweating without cause.  You have unexplained: ? Heartburn or indigestion. ? Shortness of breath or difficulty breathing. ? Nausea or vomiting. ? Fatigue. ? Feelings of nervousness or anxiety. ? Weakness.  You have sudden light-headedness or dizziness.  You faint. These symptoms may represent a serious problem that is an emergency. Do not wait to see if the symptoms will go away. Get medical help right away. Call your local emergency services (911 in the [place] Do not drive yourself to the hospital. Summary  Angina is discomfort in the chest, neck, arm, jaw, or back that is caused by a lack of blood in the arteries of the heart wall.  There are many symptoms of angina. They  include chest pain, unexplained heartburn or indigestion, sudden cold sweats, and fatigue.  Angina may be treated with lifestyle changes, medicines, or surgery.  Symptoms of angina may represent an emergency. Get medical help right away. Call your local emergency services (911 in the [place] Do not drive yourself to the hospital. This information is not intended to replace advice given to you by your health care provider. Make sure you discuss any questions you have with your health care provider. Document Revised: 09/09/2019 Document Reviewed: 09/09/2019 Elsevier Patient Education  2021 [employer].

## 2020-06-05 ENCOUNTER — Telehealth: Payer: Self-pay

## 2020-06-05 NOTE — Telephone Encounter (Signed)
Please return call to patient's home phone # at [phone].

## 2020-06-05 NOTE — Telephone Encounter (Signed)
Author: Sherren Mocha, MD Service: Cardiology Author Type: Physician  Filed: 05/31/2020 5:27 PM Encounter Date: 05/31/2020 Status: Signed  Editor: Sherren Mocha, MD (Physician)     Yes - I would try Imdur 15 mg x 1 week, then increase to 30 mg daily if needed. Thank you      Left message to call back to discuss medication recommendations.

## 2020-06-05 NOTE — Telephone Encounter (Signed)
Patient's son is returning call.

## 2020-06-05 NOTE — Telephone Encounter (Signed)
-----   Message from Sherren Mocha, MD sent at 05/31/2020  5:26 PM EST -----   ----- Message ----- From: Charlton Haws, [facility] Sent: 05/31/2020   3:40 PM EST To: Sherren Mocha, MD  Dr Burt Knack, I am a clinical pharmacist at [facility] with Dr Sharlet Salina. I spoke with Ms Kittel and her son today and they mentioned that she is taking nitroglycerin nearly every day now for "chest heaviness" - she denies pain. Her symptoms do resolve with NTG, but her BP also drops.  I am concerned about how often she is taking NTG, what do you think about adding Ranexa or low-dose isosorbide to her regimen?  Charlene Brooke, PharmD, BCACP Clinical Pharmacist [facility] at [facility] [phone]

## 2020-06-05 NOTE — Telephone Encounter (Signed)
Spoke to the patient's son (he was here for his own appointment) and told him about option to start Imdur.  He will discuss with the patient tonight.  He understands he will be called tomorrow and if the patient would like to try, Imdur will be called in. He was grateful for assistance.

## 2020-06-05 NOTE — Telephone Encounter (Signed)
Left message to call back  

## 2020-06-06 MED ORDER — ISOSORBIDE MONONITRATE ER 30 MG PO TB24: 30.0000 mg | ORAL_TABLET | Freq: Every day | ORAL | 11 refills | Status: DC

## 2020-06-06 NOTE — Telephone Encounter (Signed)
The patient agreed to try Imdur. Her son will give her 1/2 tablet for 1 week and then increase to whole tablet if tolerated. They were grateful for call and agree with plan.

## 2020-06-13 ENCOUNTER — Telehealth: Payer: Self-pay | Admitting: Cardiovascular Disease

## 2020-06-13 MED ORDER — NITROGLYCERIN 0.4 MG SL SUBL: 0.4000 mg | SUBLINGUAL_TABLET | SUBLINGUAL | 3 refills | Status: AC | PRN

## 2020-06-13 NOTE — Telephone Encounter (Signed)
Pt c/o medication issue:  1. Name of Medication: isosorbide mononitrate (IMDUR) 30 MG 24 hr tablet  2. How are you currently taking this medication (dosage and times per day)? Patient is taking 0.5 tablet for the first week.   3. Are you having a reaction (difficulty breathing--STAT)?   4. What is your medication issue? Patient started the medicine on Sunday. It seems to work well during the day, but at night she has to sleep elevated and has difficulty breathing at night. The Son says it seems odd that this only happens at night

## 2020-06-13 NOTE — Telephone Encounter (Signed)
Spoke with both the patient and her son, [name]. The patient started Imdur 15 mg daily Sunday. Every night since then, she has woken up in the middle of the night with SOB.  She takes 1 NTG tablet when these episodes occur, she sits her bed up almost all the way, and the SOB resolves in about 20 minutes. She is then able to go back to sleep. Since starting the Imdur, she feels fine during the day but nighttime has been terrible. She will hold her Imdur tomorrow morning and see if her SOB tomorrow night improves. They understand they will be called with Dr. Antionette Char recommendations.  She is scheduled for echo and visit with Dr. Burt Knack in April.

## 2020-06-14 NOTE — Telephone Encounter (Signed)
Agree with plan. If this doesn't improve we will have to evaluate her. thanks

## 2020-06-14 NOTE — Telephone Encounter (Signed)
The patient reports she slept well for the first time this week last night so she took her Imdur today and has felt fine. Informed her she will be called tomorrow to see how she sleeps tonight to see if Imdur should be continued or d/c'd.  She was grateful for call and agrees with plan.

## 2020-06-15 NOTE — Telephone Encounter (Signed)
Called to check on the patient. Her son states she slept great last night, but is weaker and more fatigued than usual today.  She has been taking the imdur daily. Her CP is overall better and she is requiring less NTG. She does take about 1 tablet daily when she has "chest fatigue" that is hard for her to describe. After one tablet the "chest fatigue" subsides.  Her BP, HR, and O2 saturations are "great." Ultimately, the patient's son decided she may have just had an "off day" today. She will continue Imdur 15 mg over the weekend. Will call early next week for an update.

## 2020-06-18 NOTE — Telephone Encounter (Signed)
Spoke with the patient and her son again. They will increase her Lasix to 40 mg BID for 3 days.  They understand they will be called in a few days for status update.  They were grateful for call and agree with plan.

## 2020-06-18 NOTE — Telephone Encounter (Signed)
Would increase lasix to 40 mg BID x 3 days, then resume once daily dosing. thanks

## 2020-06-18 NOTE — Telephone Encounter (Signed)
Pt c/o medication issue:  1. Name of Medication: isosorbide mononitrate (IMDUR) 30 MG 24 hr tablet  2. How are you currently taking this medication (dosage and times per day)? Half a tablet once daily  3. Are you having a reaction (difficulty breathing--STAT)? Yes   4. What is your medication issue?  Patient's son states the patient is still having extreme fatigue, with a cough. He states he has been giving her nitroglycerin all week and she is finally feeling a little better today. He states he will not be giving her any Isosorbide today and he wanted to make Dr. Burt Knack aware.

## 2020-06-18 NOTE — Telephone Encounter (Addendum)
Spoke with both the patient and her son, [name].  They report her weekend was "pretty bad." She feels like she is sucking for air out of the blue (at rest and on exertion). She has not been eating as much as she used to (not new, this has been over months), but she feels like she is gaining weight.  She has no noticeable swelling in her feet/ankles but feels "tight and fat in the belly." She wakes up in the morning with a cough lasting about 5-10 minutes. She is not urinating as much as usual - she only used 1 pad before lunch today when she usually uses 3 in the same time frame. She is sleeping with her bed propped in chair position.  Today, she feels a little better than she did over the weekend. She is walking more with ease and with less struggle to breathe.  She held her imdur today in case that is the culprit but has taken 2 NTG tablets for "chest fatigue."  BP today: 101/80. HR: 72. 02 sat: 95-98%  They understand Dr. Burt Knack will be consulted for recommendations and they will be called back.

## 2020-06-20 ENCOUNTER — Telehealth: Payer: Self-pay | Admitting: Cardiovascular Disease

## 2020-06-20 ENCOUNTER — Ambulatory Visit (INDEPENDENT_AMBULATORY_CARE_PROVIDER_SITE_OTHER): Payer: Medicare Other | Admitting: Physician Assistant

## 2020-06-20 ENCOUNTER — Other Ambulatory Visit: Payer: Self-pay

## 2020-06-20 ENCOUNTER — Encounter: Payer: Self-pay | Admitting: Physician Assistant

## 2020-06-20 VITALS — BP 104/62 | HR 84 | Ht 67.0 in | Wt 185.6 lb

## 2020-06-20 DIAGNOSIS — I200 Unstable angina: Secondary | ICD-10-CM | POA: Diagnosis not present

## 2020-06-20 DIAGNOSIS — I25119 Atherosclerotic heart disease of native coronary artery with unspecified angina pectoris: Secondary | ICD-10-CM | POA: Diagnosis not present

## 2020-06-20 DIAGNOSIS — I350 Nonrheumatic aortic (valve) stenosis: Secondary | ICD-10-CM

## 2020-06-20 DIAGNOSIS — I10 Essential (primary) hypertension: Secondary | ICD-10-CM

## 2020-06-20 DIAGNOSIS — I5043 Acute on chronic combined systolic (congestive) and diastolic (congestive) heart failure: Secondary | ICD-10-CM | POA: Diagnosis not present

## 2020-06-20 DIAGNOSIS — I5020 Unspecified systolic (congestive) heart failure: Secondary | ICD-10-CM | POA: Diagnosis not present

## 2020-06-20 NOTE — Patient Instructions (Addendum)
Medication Instructions:  Your physician has recommended you make the following change in your medication:  1.  RESTART the Imdur (Isorsobide)   *If you need a refill on your cardiac medications before your next appointment, please call your pharmacy*   Lab Work: TODAY:  CMET, CBC, TSH, & PRO BNP  If you have labs (blood work) drawn today and your tests are completely normal, you will receive your results only by: Marland Kitchen MyChart Message (if you have MyChart) OR . A paper copy in the mail If you have any lab test that is abnormal or we need to change your treatment, we will call you to review the results.   Testing/Procedures: None ordered today but we have moved your Echocardiogram up and will do it tomorrow, 06/21/2020, at [facility], [place], Entrance C.  They have [address] parking..  Please arrive there by 3:00.    Follow-Up: At [facility], you and your health needs are our priority.  As part of our continuing mission to provide you with exceptional heart care, we have created designated Provider Care Teams.  These Care Teams include your primary Cardiologist (physician) and Advanced Practice Providers (APPs -  Physician Assistants and Nurse Practitioners) who all work together to provide you with the care you need, when you need it.  We recommend signing up for the patient portal called "MyChart".  Sign up information is provided on this After Visit Summary.  MyChart is used to connect with patients for Virtual Visits (Telemedicine).  Patients are able to view lab/test results, encounter notes, upcoming appointments, etc.  Non-urgent messages can be sent to your provider as well.   To learn more about what you can do with MyChart, go to [email or web address].    Your next appointment:   As scheduled   The format for your next appointment:   In Person  Provider:   Sherren Mocha, MD   Other Instructions

## 2020-06-20 NOTE — Progress Notes (Signed)
Cardiology Office Note:    Date:  06/20/2020   ID:  Brittney Gates, DOB [birth year], MRN [id number]  PCP:  Brittney Koch, MD  [facility] Cardiologist:  Brittney Mocha, MD  [facility] Electrophysiologist:  None   Chief Complaint: CP and SOB  History of Present Illness:    Brittney Gates is a 87 y.o. female with a hx of CAD, severe aortic stenosis, chronic systolic heart failure secondary to nonischemic cardiomyopathy, peripheral arterial disease, hypertension, hyperlipidemia, chronic kidney disease and hypothyroidism added to my schedule for chest pain and shortness of breath.  Cardiac catheterization September 2021 showed 80% proximal and mid RCA stenosis.  Recommended medical therapy. Consider high risk PCI of RCA for refractory angina.  Patient has reduced EF and felt that cardiomyopathy out of proportion to CAD.  Last echocardiogram September 2021 showed LV function of 35 to 40% and aortic stenosis with mean gradient of 25 mmHg.  Last seen by Dr. Burt Gates January 2022 virtually.  She continues to have episodic stable angina.  Continue medical therapy.  Recent exacerbation of chest pain requiring higher dose of sublingual nitroglycerin.  Patient was placed on Imdur as well as increase Lasix for shortness of breath.  Due to worsening symptoms patient called this morning and added to my schedule for further evaluation with son.  Patient reported intermittent substernal heavy pressure radiating across her chest.  Associated with shortness of breath.  Also had orthopnea, PND and mild lower extremity edema.  Also reported intermittent palpitation. She took Imdur 15 mg last week with minimal improvement on her symptoms.  However, she continued to require 1 sublingual nitroglycerin each day.  She stopped her Imdur on Monday.  Since then, she is requiring 2 sublingual nitroglycerin each day.  Now on 40 mg twice daily dose of Lasix for past 2 days and noted improved abdominal tightness. 18  lb weight gain since oct 2021.   Past Medical History:  Diagnosis Date  . Acute pancreatitis   . ANEMIA   . ANXIETY   . Aortic stenosis    Echo 06/2019: EF 55-60, no RWMA, mild LVH, normal RV SF, moderate LAE, mild MR, moderate aortic stenosis (mean 20.3 mmHg), mild dilation of aortic root (40 mm)  . Arthritis   . Chronic anemia   . Diverticulosis   . Gastritis   . Hemorrhoids   . Hiatal hernia   . HYPERLIPIDEMIA   . HYPERTENSION   . HYPOTHYROIDISM   . LOW BACK PAIN, CHRONIC   . PAD (peripheral artery disease) ([place])     Past Surgical History:  Procedure Laterality Date  . ANGIOPLASTY  1995  . APPENDECTOMY    . BACK SURGERY     x's 2  . BREAST SURGERY     Benign  . FEMUR IM NAIL  01/21/2012   Procedure: INTRAMEDULLARY (IM) NAIL FEMORAL;  Surgeon: Newt Minion, MD;  Location: [place];  Service: Orthopedics;  Laterality: Left;  Intertrochanteric Nail Left Hip  . HEMORRHOID SURGERY    . IR GENERIC HISTORICAL  05/29/2016   IR SACROPLASTY BILATERAL 05/29/2016 [name], MD [facility]  . IR GENERIC HISTORICAL  06/19/2016   IR RADIOLOGIST EVAL & MGMT 06/19/2016 [facility]  . RIGHT/LEFT HEART CATH AND CORONARY ANGIOGRAPHY N/A 10/07/2019   Procedure: RIGHT/LEFT HEART CATH AND CORONARY ANGIOGRAPHY;  Surgeon: Jettie Booze, MD;  Location: [facility] [facility];  Service: Cardiovascular;  Laterality: N/A;  . TUBAL LIGATION      Current  Medications: Current Meds  Medication Sig  . acetaminophen (TYLENOL) 325 MG tablet Take 325 mg by mouth every 6 (six) hours as needed for mild pain, moderate pain or headache.  Marland Kitchen aspirin EC 81 MG tablet Take 81 mg by mouth daily.  . Cholecalciferol 2000 UNITS TABS Take 2,000 Units by mouth daily.  . furosemide (LASIX) 40 MG tablet Take 1 tablet (40 mg total) by mouth daily.  Marland Kitchen HYDROcodone-acetaminophen (NORCO/VICODIN) 5-325 MG tablet Take 1 tablet by mouth every 4 (four) hours as needed for moderate pain.  . isosorbide mononitrate (IMDUR)  30 MG 24 hr tablet Take 15 mg by mouth daily.  Marland Kitchen KLOR-CON SPRINKLE 10 MEQ CR capsule Take 1 capsule (10 mEq total) by mouth daily.  Marland Kitchen levothyroxine (SYNTHROID) 100 MCG tablet TAKE ONE TABLET BY MOUTH EVERY MORNING BEFORE BREAKFAST  . Multiple Vitamin (MULTIVITAMIN WITH MINERALS) TABS tablet Take 1 tablet by mouth daily.  . nitroGLYCERIN (NITROSTAT) 0.4 MG SL tablet Place 1 tablet (0.4 mg total) under the tongue every 5 (five) minutes as needed for chest pain.  [name] 3-6-9 Fatty Acids (TRIPLE OMEGA-3-6-9 PO) Take 1 capsule by mouth daily.  . pregabalin (LYRICA) 50 MG capsule TAKE ONE CAPSULE BY MOUTH TWICE A DAY  . sennosides-docusate sodium (SENOKOT-S) 8.6-50 MG tablet Take 2 tablets by mouth daily as needed for constipation.   Marland Kitchen zolpidem (AMBIEN) 10 MG tablet TAKE ONE TABLET BY MOUTH EVERY NIGHT AT BEDTIME AS NEEDED FOR SLEEP  . [DISCONTINUED] isosorbide mononitrate (IMDUR) 30 MG 24 hr tablet Take 1 tablet (30 mg total) by mouth daily. (Patient taking differently: Take 15 mg by mouth daily.)     Allergies:   Statins, Lidocaine, and Metamucil [psyllium]   Social History   Socioeconomic History  . Marital status: Widowed    Spouse name: Not on file  . Number of children: 4  . Years of education: Not on file  . Highest education level: Not on file  Occupational History  . Occupation: retired  Tobacco Use  . Smoking status: Never Smoker  . Smokeless tobacco: Never Used  Vaping Use  . Vaping Use: Never used  Substance and Sexual Activity  . Alcohol use: No    Alcohol/week: 0.0 standard drinks  . Drug use: No  . Sexual activity: Not on file  Other Topics Concern  . 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 Difficulty  of Paying Living Expenses: Not hard at all  Food Insecurity: Not on file  Transportation Needs: Not on file  Physical Activity: Not on file  Stress: Not on file  Social Connections: Not on file     Family History: The patient's family history includes Gallbladder disease in her daughter and son; Lung cancer in her father. There is no history of Colon cancer.    ROS:   Please see the history of present illness.    All other systems reviewed and are negative.   EKGs/Labs/Other Studies Reviewed:    The following studies were reviewed today:  Echo 11/2019 1. Left ventricular ejection fraction, by estimation, is 35 to 40%. The  left ventricle has moderately decreased function. The left ventricle  demonstrates regional wall motion abnormalities (  see scoring  diagram/findings for description). The left  ventricular internal cavity size was moderately dilated. Left ventricular  diastolic parameters are consistent with Grade II diastolic dysfunction  (pseudonormalization). Elevated left ventricular end-diastolic pressure.  The E/e' is 47. The average left  ventricular global longitudinal strain is -9.0 %. The global longitudinal  strain is abnormal.  2. Right ventricular systolic function is normal. The right ventricular  size is normal.  3. The mitral valve is degenerative. Mild to moderate mitral valve  regurgitation. No evidence of mitral stenosis. Moderate mitral annular  calcification.  4. Severe AS, likely low flow low gradient aortic valve stenosis. AVA  0.86 cm2, iAVA 0.45 cm2/m2. SVI 36 mL. Dimensionless index 0.25. The  aortic valve is calcified. There is severe calcification of the aortic  valve. Aortic valve regurgitation is not  visualized. No aortic stenosis is present. Aortic valve mean gradient  measures 25.0 mmHg.  5. The inferior vena cava is normal in size with greater than 50%  respiratory variability, suggesting right atrial pressure of 3 mmHg.     RIGHT/LEFT HEART CATH AND CORONARY ANGIOGRAPHY 09/2019    Conclusion    Prox RCA lesion is 80% stenosed. Mid RCA lesion is 80% stenosed. Heavily calcified disease that would require atherectomy for PCI.  Mid LAD lesion is 50% stenosed.  There is moderate left ventricular systolic dysfunction.  The left ventricular ejection fraction is 25-35% by visual estimate.  LV end diastolic pressure is mildly elevated.  There is moderate aortic valve stenosis. Mean gradient 18 mm Hg. Valve area 1.5 cm2  Ao sat 89% on RA, PA sat 64% on RA; PA pressure 53/23, mean PA pressure 38 mm Hg; mean PCWP 26 mm Hg; CO 7.6 L.min; CI 4.1   Continue with management of LV dysfunction, which seems out of proportion to CAD.  Will watch at [facility] for continued diuresis and medicine titration.   If she has refractory angina, could consider high risk PCI of RCA with atherectomy.    Findings discussed with the son, Odette Watanabe.  Diagnostic Dominance: Right        EKG:  EKG is ordered today.  The ekg ordered today demonstrates normal sinus rhythm at rate of 84 bpm, PVC, chronic T wave inversion in lateral leads  Recent Labs: 10/05/2019: ALT 15 10/07/2019: B Natriuretic Peptide 344.0 10/09/2019: TSH 4.157 10/11/2019: Hemoglobin 9.8; Platelets 137 05/01/2020: BUN 24; Creatinine, Ser 1.25; Potassium 4.4; Sodium 144  Recent Lipid Panel    Component Value Date/Time   CHOL 214 (H) 09/09/2019 1451   TRIG 141.0 09/09/2019 1451   TRIG 120 03/29/2010 0000   HDL 51.00 09/09/2019 1451   CHOLHDL 4 09/09/2019 1451   VLDL 28.2 09/09/2019 1451   LDLCALC 135 (H) 09/09/2019 1451   LDLDIRECT 143.1 12/08/2012 1223    Physical Exam:    VS:  BP 104/62   Pulse 84   Ht $R'5\' 7"'Rj$  (1.702 m)   Wt 185 lb 9.6 oz (84.2 kg)   BMI 29.07 kg/m     Wt Readings from Last 3 Encounters:  06/20/20 185 lb 9.6 oz (84.2 kg)  04/06/20 167 lb (75.8 kg)  01/04/20 167 lb 6.4 oz (75.9 kg)     GEN: Well nourished, well developed  in no acute distress HEENT: Normal NECK: No JVD; No carotid bruits LYMPHATICS: No lymphadenopathy CARDIAC: RRR, 3/6 systolic murmurs, rubs, gallops RESPIRATORY:  Clear to auscultation without rales, wheezing or rhonchi  ABDOMEN: Soft, non-tender, distended MUSCULOSKELETAL: Trace edema L >  R; No deformity  SKIN: Warm and dry NEUROLOGIC:  Alert and oriented x 3 PSYCHIATRIC:  Normal affect   ASSESSMENT AND PLAN:    1. Unstable angina with history of CAD -Medical management of RCA disease recommended after cardiac catheterization in 2021. -She took Imdur 15 mg last week but still required to take sublingual nitroglycerin once every day.  She stopped her Imdur 2 days ago now requiring 2 sublingual nitroglycerin twice per day. -Recommended to restart Imdur 15 mg daily -Continue aspirin 81 mg daily -Blood pressure runs in 100s.  2.  Acute on chronic combined CHF -18 pound weight gain since last office visit. -Evidence of volume overload on exam -Improved abdominal tightness since on Lasix 40 mg twice daily -Check blood work -Continue Lasix 40 mg twice a day  3.  Aortic stenosis -Likely contributing to her symptoms -Will update echocardiogram tomorrow -Suspect low gradient AAS  4. PVCS - Check electrolyes   Patient is DNR but okay to take off status temporary if recommended cath.   Medication Adjustments/Labs and Tests Ordered: Current medicines are reviewed at length with the patient today.  Concerns regarding medicines are outlined above.  Orders Placed This Encounter  Procedures  . Basic metabolic panel  . CBC  . Comp Met (CMET)  . Pro b natriuretic peptide (BNP)  . TSH  . EKG 12-Lead   No orders of the defined types were placed in this encounter.   Patient Instructions  Medication Instructions:  Your physician has recommended you make the following change in your medication:  1.  RESTART the Imdur (Isorsobide)   *If you need a refill on your cardiac medications  before your next appointment, please call your pharmacy*   Lab Work: TODAY:  CMET, CBC, TSH, & PRO BNP  If you have labs (blood work) drawn today and your tests are completely normal, you will receive your results only by: Marland Kitchen MyChart Message (if you have MyChart) OR . A paper copy in the mail If you have any lab test that is abnormal or we need to change your treatment, we will call you to review the results.   Testing/Procedures: None ordered today but we have moved your Echocardiogram up and will do it tomorrow, 06/21/2020, at [facility], [place], Entrance C.  They have [place] parking..  Please arrive there by 3:00.    Follow-Up: At [facility], you and your health needs are our priority.  As part of our continuing mission to provide you with exceptional heart care, we have created designated Provider Care Teams.  These Care Teams include your primary Cardiologist (physician) and Advanced Practice Providers (APPs -  Physician Assistants and Nurse Practitioners) who all work together to provide you with the care you need, when you need it.  We recommend signing up for the patient portal called "MyChart".  Sign up information is provided on this After Visit Summary.  MyChart is used to connect with patients for Virtual Visits (Telemedicine).  Patients are able to view lab/test results, encounter notes, upcoming appointments, etc.  Non-urgent messages can be sent to your provider as well.   To learn more about what you can do with MyChart, go to [email or web address].    Your next appointment:   As scheduled   The format for your next appointment:   In Person  Provider:   Sherren Mocha, MD   Other Instructions      Signed, Leanor Kail, PA  06/20/2020 3:38 PM  [facility]

## 2020-06-20 NOTE — Telephone Encounter (Signed)
Spoke with the patient and her son, [name].  He states that while her stomach doesn't feel as tight and bloated after increasing Lasix to 40 mg BID, her breathing has gotten worse. She is still not taking the Imdur 15 mg (it was previously thought that the Imdur caused the SOB). Since stopping Imdur, the amount of SL NTG required is increased for her "chest fatigue." She continues not to sleep well at night. The patient's son does not think her symptoms require ER visit at this time as she is not in distress. Scheduled her for evaluation with B. Bhagat this afternoon and understand to proceed to ER instead if symptoms worsen. They were grateful for call and agree with plan.

## 2020-06-20 NOTE — Telephone Encounter (Signed)
Patient's so was calling back to say that he feels his mother is getting worse since be 3 days off the pill. Please advise

## 2020-06-21 ENCOUNTER — Ambulatory Visit (HOSPITAL_COMMUNITY)
Admission: RE | Admit: 2020-06-21 | Discharge: 2020-06-21 | Disposition: A | Discharge status: Home / Self Care | Payer: Medicare Other | Source: Ambulatory Visit | Attending: Cardiovascular Disease | Admitting: Cardiovascular Disease

## 2020-06-21 DIAGNOSIS — I509 Heart failure, unspecified: Secondary | ICD-10-CM | POA: Insufficient documentation

## 2020-06-21 DIAGNOSIS — R079 Chest pain, unspecified: Secondary | ICD-10-CM | POA: Insufficient documentation

## 2020-06-21 DIAGNOSIS — I350 Nonrheumatic aortic (valve) stenosis: Secondary | ICD-10-CM | POA: Diagnosis not present

## 2020-06-21 DIAGNOSIS — I110 Hypertensive heart disease with heart failure: Secondary | ICD-10-CM | POA: Diagnosis not present

## 2020-06-21 DIAGNOSIS — I080 Rheumatic disorders of both mitral and aortic valves: Secondary | ICD-10-CM | POA: Insufficient documentation

## 2020-06-21 DIAGNOSIS — R0600 Dyspnea, unspecified: Secondary | ICD-10-CM | POA: Diagnosis not present

## 2020-06-21 DIAGNOSIS — E785 Hyperlipidemia, unspecified: Secondary | ICD-10-CM | POA: Insufficient documentation

## 2020-06-21 LAB — COMPREHENSIVE METABOLIC PANEL
ALT: 21 IU/L (ref 0–32)
AST: 25 IU/L (ref 0–40)
Albumin/Globulin Ratio: 1.9 (ref 1.2–2.2)
Albumin: 4.3 g/dL (ref 3.5–4.6)
Alkaline Phosphatase: 76 IU/L (ref 44–121)
BUN/Creatinine Ratio: 26 (ref 12–28)
BUN: 43 mg/dL — ABNORMAL HIGH (ref 10–36)
Bilirubin Total: 0.5 mg/dL (ref 0.0–1.2)
CO2: 21 mmol/L (ref 20–29)
Calcium: 9.1 mg/dL (ref 8.7–10.3)
Chloride: 94 mmol/L — ABNORMAL LOW (ref 96–106)
Creatinine, Ser: 1.65 mg/dL — ABNORMAL HIGH (ref 0.57–1.00)
Globulin, Total: 2.3 g/dL (ref 1.5–4.5)
Glucose: 106 mg/dL — ABNORMAL HIGH (ref 65–99)
Potassium: 4.6 mmol/L (ref 3.5–5.2)
Sodium: 132 mmol/L — ABNORMAL LOW (ref 134–144)
Total Protein: 6.6 g/dL (ref 6.0–8.5)
eGFR: 29 mL/min/{1.73_m2} — ABNORMAL LOW (ref >59)

## 2020-06-21 LAB — CBC
Hematocrit: 29.3 % — ABNORMAL LOW (ref 34.0–46.6)
Hemoglobin: 10 g/dL — ABNORMAL LOW (ref 11.1–15.9)
MCH: 34.8 pg — ABNORMAL HIGH (ref 26.6–33.0)
MCHC: 34.1 g/dL (ref 31.5–35.7)
MCV: 102 fL — ABNORMAL HIGH (ref 79–97)
RBC: 2.87 x10E6/uL — ABNORMAL LOW (ref 3.77–5.28)
RDW: 17.1 % — ABNORMAL HIGH (ref 11.7–15.4)
WBC: 7.3 10*3/uL (ref 3.4–10.8)

## 2020-06-21 LAB — ECHOCARDIOGRAM COMPLETE
AR max vel: 2.22 cm2
AV Area VTI: 1.99 cm2
AV Area mean vel: 2.24 cm2
AV Mean grad: 20.3 mmHg
AV Peak grad: 33.6 mmHg
Ao pk vel: 2.9 m/s
Area-P 1/2: 3.91 cm2
Calc EF: 9.7 %
MV M vel: 5.15 m/s
MV Peak grad: 106.1 mmHg
Radius: 0.35 cm
S' Lateral: 5.5 cm
Single Plane A2C EF: 5.4 %
Single Plane A4C EF: 22.1 %

## 2020-06-21 LAB — TSH: TSH: 9.71 u[IU]/mL — ABNORMAL HIGH (ref 0.450–4.500)

## 2020-06-21 LAB — PRO B NATRIURETIC PEPTIDE: NT-Pro BNP: 9393 pg/mL — ABNORMAL HIGH (ref 0–738)

## 2020-06-21 NOTE — Progress Notes (Signed)
  Echocardiogram 2D Echocardiogram has been performed.  Brittney Gates 06/21/2020, 4:11 PM

## 2020-06-22 ENCOUNTER — Telehealth: Payer: Self-pay | Admitting: Internal Medicine

## 2020-06-22 ENCOUNTER — Telehealth: Payer: Self-pay | Admitting: Physician Assistant

## 2020-06-22 ENCOUNTER — Inpatient Hospital Stay (HOSPITAL_COMMUNITY): Admission: AD | Admit: 2020-06-22 | Payer: Medicare Other | Source: Ambulatory Visit | Admitting: Cardiovascular Disease

## 2020-06-22 NOTE — Telephone Encounter (Signed)
See below

## 2020-06-22 NOTE — Telephone Encounter (Signed)
Monisha from [facility] has called saying the patient is continually declining the service.   The patients son has asked for her to have an Hospice consult  Malachy Mood is requesting [employer] for an hospice consult and if this gets approved she wants to know if the patient will still need to be seen with [facility].  Monisha- [phone]

## 2020-06-22 NOTE — Telephone Encounter (Signed)
Patient has bed available however patient and son decided to stay home.  Patient will be on home hospice care.  They were appreciative of call and care.

## 2020-06-22 NOTE — Telephone Encounter (Signed)
Elevated  fluid marker level.  Worsened renal function.  Abnormal TSH.  Hemoglobin stable. Echocardiogram with BiV failure and worsening LV function.  Concern for low gradient low flow aortic stenosis.  Reviewed with Dr. Burt Knack.  Plan for direct admission for diuresis and palliative consult.  Reviewed findings with patient and son [name].  Awaiting bed placement.

## 2020-06-22 NOTE — Telephone Encounter (Signed)
Correction: Patients health is declining; she is not declining the service*  Also need to know if Sharlet Salina will continue to be her attending?

## 2020-06-25 ENCOUNTER — Telehealth: Payer: Self-pay | Admitting: Cardiovascular Disease

## 2020-06-25 NOTE — Telephone Encounter (Signed)
Pt c/o Shortness Of Breath: STAT if SOB developed within the last 24 hours or pt is noticeably SOB on the phone  1. Are you currently SOB (can you hear that pt is SOB on the phone)? Yes, Esteen Delpriore states the patient is currently very SOB  2. How long have you been experiencing SOB? About 1 week, but the SOB is becoming progressively worse per Pilar Jarvis  3. Are you SOB when sitting or when up moving around? Both   4. Are you currently experiencing any other symptoms? Weakness/fatigue  John states the patient is drastically declining. She recently began hospice care and she has been SOB for the past week. [name] states it is becoming progressively worse.  He states she is SOB even when laying down. She has also been extremely weak. He would like to know if she can start taking a full dose of Nitroglycerin.

## 2020-06-25 NOTE — Telephone Encounter (Signed)
Returned call to Pt's son.  Per son Pt is having increased shortness of breath and he is wondering if she can take an extra half Imdur.  Spoke with DOD.  Advised to have Pt take an extra lasix for SOB.  May also take extra half Imdur.  Advised son.  Per son hospice is supposed to be coming to admit Pt today.

## 2020-06-25 NOTE — Telephone Encounter (Signed)
[place] for orders for hospice referral, I will be attending if they ask.

## 2020-06-27 DIAGNOSIS — I110 Hypertensive heart disease with heart failure: Secondary | ICD-10-CM | POA: Diagnosis not present

## 2020-06-27 DIAGNOSIS — I25119 Atherosclerotic heart disease of native coronary artery with unspecified angina pectoris: Secondary | ICD-10-CM | POA: Diagnosis not present

## 2020-06-27 DIAGNOSIS — G629 Polyneuropathy, unspecified: Secondary | ICD-10-CM | POA: Diagnosis not present

## 2020-06-27 DIAGNOSIS — E039 Hypothyroidism, unspecified: Secondary | ICD-10-CM | POA: Diagnosis not present

## 2020-06-27 DIAGNOSIS — I350 Nonrheumatic aortic (valve) stenosis: Secondary | ICD-10-CM | POA: Diagnosis not present

## 2020-06-27 DIAGNOSIS — I739 Peripheral vascular disease, unspecified: Secondary | ICD-10-CM | POA: Diagnosis not present

## 2020-06-27 DIAGNOSIS — I5032 Chronic diastolic (congestive) heart failure: Secondary | ICD-10-CM | POA: Diagnosis not present

## 2020-06-27 DIAGNOSIS — E785 Hyperlipidemia, unspecified: Secondary | ICD-10-CM | POA: Diagnosis not present

## 2020-06-28 DIAGNOSIS — E785 Hyperlipidemia, unspecified: Secondary | ICD-10-CM | POA: Diagnosis not present

## 2020-06-28 DIAGNOSIS — I110 Hypertensive heart disease with heart failure: Secondary | ICD-10-CM | POA: Diagnosis not present

## 2020-06-28 DIAGNOSIS — I350 Nonrheumatic aortic (valve) stenosis: Secondary | ICD-10-CM | POA: Diagnosis not present

## 2020-06-28 DIAGNOSIS — I739 Peripheral vascular disease, unspecified: Secondary | ICD-10-CM | POA: Diagnosis not present

## 2020-06-28 DIAGNOSIS — I5032 Chronic diastolic (congestive) heart failure: Secondary | ICD-10-CM | POA: Diagnosis not present

## 2020-06-28 DIAGNOSIS — I25119 Atherosclerotic heart disease of native coronary artery with unspecified angina pectoris: Secondary | ICD-10-CM | POA: Diagnosis not present

## 2020-06-29 DIAGNOSIS — I25119 Atherosclerotic heart disease of native coronary artery with unspecified angina pectoris: Secondary | ICD-10-CM | POA: Diagnosis not present

## 2020-06-29 DIAGNOSIS — I739 Peripheral vascular disease, unspecified: Secondary | ICD-10-CM | POA: Diagnosis not present

## 2020-06-29 DIAGNOSIS — E039 Hypothyroidism, unspecified: Secondary | ICD-10-CM | POA: Diagnosis not present

## 2020-06-29 DIAGNOSIS — G629 Polyneuropathy, unspecified: Secondary | ICD-10-CM | POA: Diagnosis not present

## 2020-06-29 DIAGNOSIS — I5032 Chronic diastolic (congestive) heart failure: Secondary | ICD-10-CM | POA: Diagnosis not present

## 2020-06-29 DIAGNOSIS — I110 Hypertensive heart disease with heart failure: Secondary | ICD-10-CM | POA: Diagnosis not present

## 2020-06-29 DIAGNOSIS — E785 Hyperlipidemia, unspecified: Secondary | ICD-10-CM | POA: Diagnosis not present

## 2020-06-29 DIAGNOSIS — I350 Nonrheumatic aortic (valve) stenosis: Secondary | ICD-10-CM | POA: Diagnosis not present

## 2020-07-02 DIAGNOSIS — I110 Hypertensive heart disease with heart failure: Secondary | ICD-10-CM | POA: Diagnosis not present

## 2020-07-02 DIAGNOSIS — I739 Peripheral vascular disease, unspecified: Secondary | ICD-10-CM | POA: Diagnosis not present

## 2020-07-02 DIAGNOSIS — I5032 Chronic diastolic (congestive) heart failure: Secondary | ICD-10-CM | POA: Diagnosis not present

## 2020-07-02 DIAGNOSIS — E785 Hyperlipidemia, unspecified: Secondary | ICD-10-CM | POA: Diagnosis not present

## 2020-07-02 DIAGNOSIS — I350 Nonrheumatic aortic (valve) stenosis: Secondary | ICD-10-CM | POA: Diagnosis not present

## 2020-07-02 DIAGNOSIS — I25119 Atherosclerotic heart disease of native coronary artery with unspecified angina pectoris: Secondary | ICD-10-CM | POA: Diagnosis not present

## 2020-07-06 ENCOUNTER — Telehealth: Payer: Self-pay | Admitting: Cardiovascular Disease

## 2020-07-06 ENCOUNTER — Telehealth: Payer: Self-pay | Admitting: Internal Medicine

## 2020-07-06 MED ORDER — FUROSEMIDE 40 MG PO TABS: 40.0000 mg | ORAL_TABLET | Freq: Two times a day (BID) | ORAL | 1 refills | Status: DC

## 2020-07-06 NOTE — Telephone Encounter (Signed)
   Pt c/o medication issue:  1. Name of Medication:   furosemide (LASIX) 40 MG tablet    2. How are you currently taking this medication (dosage and times per day)? Take 1 tablet (40 mg total) by mouth daily.  3. Are you having a reaction (difficulty breathing--STAT)?   4. What is your medication issue? John called, he said pt is running low on lasix since Dr. Burt Knack increased her lasix to 2 times a day. Need new prescription with new dosage send to pt's pharmacy [facility], [place]

## 2020-07-06 NOTE — Telephone Encounter (Signed)
Manuela Schwartz from [facility] has called in regards to the patient.  A fax has been sent or is being sent from the [facility] and she wants [place] to disregard the fax.  She does NOT need oxygen and if she does, they will provide it.

## 2020-07-06 NOTE — Telephone Encounter (Signed)
Per Vin Bhagat's office note from 3/23, pt is supposed to continue taking Lasix 40mg  BID. I have sent in a new Rx and updated med list.

## 2020-07-09 ENCOUNTER — Telehealth: Payer: Medicare Other

## 2020-07-10 DIAGNOSIS — I5032 Chronic diastolic (congestive) heart failure: Secondary | ICD-10-CM | POA: Diagnosis not present

## 2020-07-10 DIAGNOSIS — I110 Hypertensive heart disease with heart failure: Secondary | ICD-10-CM | POA: Diagnosis not present

## 2020-07-10 DIAGNOSIS — I739 Peripheral vascular disease, unspecified: Secondary | ICD-10-CM | POA: Diagnosis not present

## 2020-07-10 DIAGNOSIS — I350 Nonrheumatic aortic (valve) stenosis: Secondary | ICD-10-CM | POA: Diagnosis not present

## 2020-07-10 DIAGNOSIS — E785 Hyperlipidemia, unspecified: Secondary | ICD-10-CM | POA: Diagnosis not present

## 2020-07-10 DIAGNOSIS — I25119 Atherosclerotic heart disease of native coronary artery with unspecified angina pectoris: Secondary | ICD-10-CM | POA: Diagnosis not present

## 2020-07-11 ENCOUNTER — Other Ambulatory Visit (HOSPITAL_COMMUNITY): Payer: Medicare Other

## 2020-07-11 ENCOUNTER — Ambulatory Visit: Payer: Medicare Other | Admitting: Cardiovascular Disease

## 2020-07-11 DIAGNOSIS — E785 Hyperlipidemia, unspecified: Secondary | ICD-10-CM | POA: Diagnosis not present

## 2020-07-11 DIAGNOSIS — I350 Nonrheumatic aortic (valve) stenosis: Secondary | ICD-10-CM | POA: Diagnosis not present

## 2020-07-11 DIAGNOSIS — I5032 Chronic diastolic (congestive) heart failure: Secondary | ICD-10-CM | POA: Diagnosis not present

## 2020-07-11 DIAGNOSIS — I110 Hypertensive heart disease with heart failure: Secondary | ICD-10-CM | POA: Diagnosis not present

## 2020-07-11 DIAGNOSIS — I25119 Atherosclerotic heart disease of native coronary artery with unspecified angina pectoris: Secondary | ICD-10-CM | POA: Diagnosis not present

## 2020-07-11 DIAGNOSIS — I739 Peripheral vascular disease, unspecified: Secondary | ICD-10-CM | POA: Diagnosis not present

## 2020-07-13 DIAGNOSIS — I5032 Chronic diastolic (congestive) heart failure: Secondary | ICD-10-CM | POA: Diagnosis not present

## 2020-07-13 DIAGNOSIS — I110 Hypertensive heart disease with heart failure: Secondary | ICD-10-CM | POA: Diagnosis not present

## 2020-07-13 DIAGNOSIS — E785 Hyperlipidemia, unspecified: Secondary | ICD-10-CM | POA: Diagnosis not present

## 2020-07-13 DIAGNOSIS — I350 Nonrheumatic aortic (valve) stenosis: Secondary | ICD-10-CM | POA: Diagnosis not present

## 2020-07-13 DIAGNOSIS — I739 Peripheral vascular disease, unspecified: Secondary | ICD-10-CM | POA: Diagnosis not present

## 2020-07-13 DIAGNOSIS — I25119 Atherosclerotic heart disease of native coronary artery with unspecified angina pectoris: Secondary | ICD-10-CM | POA: Diagnosis not present

## 2020-07-16 DIAGNOSIS — E785 Hyperlipidemia, unspecified: Secondary | ICD-10-CM | POA: Diagnosis not present

## 2020-07-16 DIAGNOSIS — I739 Peripheral vascular disease, unspecified: Secondary | ICD-10-CM | POA: Diagnosis not present

## 2020-07-16 DIAGNOSIS — I5032 Chronic diastolic (congestive) heart failure: Secondary | ICD-10-CM | POA: Diagnosis not present

## 2020-07-16 DIAGNOSIS — I25119 Atherosclerotic heart disease of native coronary artery with unspecified angina pectoris: Secondary | ICD-10-CM | POA: Diagnosis not present

## 2020-07-16 DIAGNOSIS — I350 Nonrheumatic aortic (valve) stenosis: Secondary | ICD-10-CM | POA: Diagnosis not present

## 2020-07-16 DIAGNOSIS — I110 Hypertensive heart disease with heart failure: Secondary | ICD-10-CM | POA: Diagnosis not present

## 2020-07-18 DIAGNOSIS — I739 Peripheral vascular disease, unspecified: Secondary | ICD-10-CM | POA: Diagnosis not present

## 2020-07-18 DIAGNOSIS — I110 Hypertensive heart disease with heart failure: Secondary | ICD-10-CM | POA: Diagnosis not present

## 2020-07-18 DIAGNOSIS — E785 Hyperlipidemia, unspecified: Secondary | ICD-10-CM | POA: Diagnosis not present

## 2020-07-18 DIAGNOSIS — I25119 Atherosclerotic heart disease of native coronary artery with unspecified angina pectoris: Secondary | ICD-10-CM | POA: Diagnosis not present

## 2020-07-18 DIAGNOSIS — I350 Nonrheumatic aortic (valve) stenosis: Secondary | ICD-10-CM | POA: Diagnosis not present

## 2020-07-18 DIAGNOSIS — I5032 Chronic diastolic (congestive) heart failure: Secondary | ICD-10-CM | POA: Diagnosis not present

## 2020-07-24 ENCOUNTER — Telehealth: Payer: Self-pay | Admitting: Internal Medicine

## 2020-07-24 DIAGNOSIS — I110 Hypertensive heart disease with heart failure: Secondary | ICD-10-CM | POA: Diagnosis not present

## 2020-07-24 DIAGNOSIS — I25119 Atherosclerotic heart disease of native coronary artery with unspecified angina pectoris: Secondary | ICD-10-CM | POA: Diagnosis not present

## 2020-07-24 DIAGNOSIS — I5032 Chronic diastolic (congestive) heart failure: Secondary | ICD-10-CM | POA: Diagnosis not present

## 2020-07-24 DIAGNOSIS — I739 Peripheral vascular disease, unspecified: Secondary | ICD-10-CM | POA: Diagnosis not present

## 2020-07-24 DIAGNOSIS — I350 Nonrheumatic aortic (valve) stenosis: Secondary | ICD-10-CM | POA: Diagnosis not present

## 2020-07-24 DIAGNOSIS — E785 Hyperlipidemia, unspecified: Secondary | ICD-10-CM | POA: Diagnosis not present

## 2020-07-24 NOTE — Telephone Encounter (Signed)
Brittney Gates from [facility] called   Patient needs refills for nitroGLYCERIN (NITROSTAT) 0.4 MG SL tablet and pregabalin (LYRICA) 50 MG capsule.   She would like a call back as well so that she can give an update on the patient's health.   Please give her call back at [phone]

## 2020-07-25 DIAGNOSIS — I5032 Chronic diastolic (congestive) heart failure: Secondary | ICD-10-CM | POA: Diagnosis not present

## 2020-07-25 DIAGNOSIS — E785 Hyperlipidemia, unspecified: Secondary | ICD-10-CM | POA: Diagnosis not present

## 2020-07-25 DIAGNOSIS — I350 Nonrheumatic aortic (valve) stenosis: Secondary | ICD-10-CM | POA: Diagnosis not present

## 2020-07-25 DIAGNOSIS — I25119 Atherosclerotic heart disease of native coronary artery with unspecified angina pectoris: Secondary | ICD-10-CM | POA: Diagnosis not present

## 2020-07-25 DIAGNOSIS — I110 Hypertensive heart disease with heart failure: Secondary | ICD-10-CM | POA: Diagnosis not present

## 2020-07-25 DIAGNOSIS — I739 Peripheral vascular disease, unspecified: Secondary | ICD-10-CM | POA: Diagnosis not present

## 2020-07-25 NOTE — Telephone Encounter (Signed)
Spoke with the Brittney Gates from [facility]. Nitroglycerin was prescribed by the hospice doctor. Lyrica is due in 20 days she is wondering if we can send a refill that can be filled in 20 days. On oxygen now. She is now walking from the hospital bed to the recliner, sometimes less than that. She wanted let us to know that the son did give the mom a extra dose of lasix due to him hearing crackles when he placed his ear to her chest to listen to her breathing. He was advised not to make that decision without talking to the doctor

## 2020-07-26 MED ORDER — PREGABALIN 50 MG PO CAPS: 50.0000 mg | ORAL_CAPSULE | Freq: Two times a day (BID) | ORAL | 5 refills | Status: AC

## 2020-07-26 NOTE — Telephone Encounter (Signed)
Sent in [place]. She should have plenty of refills of nitro as her cardiologist refilled in March.

## 2020-07-29 DIAGNOSIS — I739 Peripheral vascular disease, unspecified: Secondary | ICD-10-CM | POA: Diagnosis not present

## 2020-07-29 DIAGNOSIS — E785 Hyperlipidemia, unspecified: Secondary | ICD-10-CM | POA: Diagnosis not present

## 2020-07-29 DIAGNOSIS — I350 Nonrheumatic aortic (valve) stenosis: Secondary | ICD-10-CM | POA: Diagnosis not present

## 2020-07-29 DIAGNOSIS — I25119 Atherosclerotic heart disease of native coronary artery with unspecified angina pectoris: Secondary | ICD-10-CM | POA: Diagnosis not present

## 2020-07-29 DIAGNOSIS — G629 Polyneuropathy, unspecified: Secondary | ICD-10-CM | POA: Diagnosis not present

## 2020-07-29 DIAGNOSIS — E039 Hypothyroidism, unspecified: Secondary | ICD-10-CM | POA: Diagnosis not present

## 2020-07-29 DIAGNOSIS — I110 Hypertensive heart disease with heart failure: Secondary | ICD-10-CM | POA: Diagnosis not present

## 2020-07-29 DIAGNOSIS — I5032 Chronic diastolic (congestive) heart failure: Secondary | ICD-10-CM | POA: Diagnosis not present

## 2020-07-30 DIAGNOSIS — E785 Hyperlipidemia, unspecified: Secondary | ICD-10-CM | POA: Diagnosis not present

## 2020-07-30 DIAGNOSIS — I25119 Atherosclerotic heart disease of native coronary artery with unspecified angina pectoris: Secondary | ICD-10-CM | POA: Diagnosis not present

## 2020-07-30 DIAGNOSIS — I350 Nonrheumatic aortic (valve) stenosis: Secondary | ICD-10-CM | POA: Diagnosis not present

## 2020-07-30 DIAGNOSIS — I110 Hypertensive heart disease with heart failure: Secondary | ICD-10-CM | POA: Diagnosis not present

## 2020-07-30 DIAGNOSIS — I739 Peripheral vascular disease, unspecified: Secondary | ICD-10-CM | POA: Diagnosis not present

## 2020-07-30 DIAGNOSIS — I5032 Chronic diastolic (congestive) heart failure: Secondary | ICD-10-CM | POA: Diagnosis not present

## 2020-07-31 ENCOUNTER — Telehealth: Payer: Self-pay | Admitting: Cardiovascular Disease

## 2020-07-31 DIAGNOSIS — I739 Peripheral vascular disease, unspecified: Secondary | ICD-10-CM | POA: Diagnosis not present

## 2020-07-31 DIAGNOSIS — I350 Nonrheumatic aortic (valve) stenosis: Secondary | ICD-10-CM | POA: Diagnosis not present

## 2020-07-31 DIAGNOSIS — I25119 Atherosclerotic heart disease of native coronary artery with unspecified angina pectoris: Secondary | ICD-10-CM | POA: Diagnosis not present

## 2020-07-31 DIAGNOSIS — I110 Hypertensive heart disease with heart failure: Secondary | ICD-10-CM | POA: Diagnosis not present

## 2020-07-31 DIAGNOSIS — I5032 Chronic diastolic (congestive) heart failure: Secondary | ICD-10-CM | POA: Diagnosis not present

## 2020-07-31 DIAGNOSIS — E785 Hyperlipidemia, unspecified: Secondary | ICD-10-CM | POA: Diagnosis not present

## 2020-07-31 NOTE — Telephone Encounter (Signed)
Per son patient has been swollen for a month, in her abdomen, legs and feet. Last office visit weight was 185 (3/23) however patient is unable to get a weight safely at home. The patient has not missed any doses of lasix. No changes in diet, salt intake or fluid intake. 48 - 60 oz a day. The patient is developing blisters on the tops of her feet, this is what cause her son to call in. Flat: BP 96/72 HR 88 Sitting: 109/81 HR 94  Patient sits in recliner with feet up most of the the day, recommended adding a pillow or two if patient able to tolerate. Patient unable to use compression stocking related to pain in feet which son says is neuropathy. 24 hour oxygen on 2 L since April 13. Not had to increase oxygen since start. Patient is in a hospital bed at home and the head of the bed is elevated when she sleeps, this has not changed and is normal for the patient. Of note she has been on Hospice care for about a month. Advised I will forward to [place], PA, and Nurse for further advisement.

## 2020-07-31 NOTE — Telephone Encounter (Signed)
Pt c/o swelling: STAT is pt has developed SOB within 24 hours  1) How much weight have you gained and in what time span? Yes  2) If swelling, where is the swelling located? Top of the foot and bottom of the legs  Are you currently taking a fluid pill? Yes  3) Are you currently SOB? Yes patient also on O2 4) Do you have a log of your daily weights (if so, list)? No   5) Have you gained 3 pounds in a day or 5 pounds in a week? Yes   6) Have you traveled recently? No

## 2020-08-02 NOTE — Telephone Encounter (Signed)
Spoke with [name]. He states Ms. Brindisi is doing OK but he is concerned about her leg swelling. They are swollen up past her knees. She has her legs elevated 90% of the time and it does help with the swelling. She is on home oxygen 24/7 and she has "no complaints" about her breathing. John requests to increase Lasix. She is currently taking 40 mg BID. Hospice is helping as much as they can to make her comfortable but have not changed her diuretics. He does not wish to take her to the hospital where IV diuretics will be used and it will be a "short fix."   They understand they will be called with recommendations.

## 2020-08-02 NOTE — Telephone Encounter (Signed)
Left message to call back  

## 2020-08-03 MED ORDER — FUROSEMIDE 40 MG PO TABS: ORAL_TABLET | ORAL | 1 refills | Status: AC

## 2020-08-03 NOTE — Telephone Encounter (Signed)
Spoke with the patient and Brittney Gates. Instructed her to INCREASE LASIX to 80 mg in the morning and 40 mg in the afternoon. They were grateful for call and agree with plan.

## 2020-08-03 NOTE — Telephone Encounter (Signed)
Would increase furosemide to 80 mg in the morning and 40 mg in the afternoon. thanks

## 2020-08-06 DIAGNOSIS — I739 Peripheral vascular disease, unspecified: Secondary | ICD-10-CM | POA: Diagnosis not present

## 2020-08-06 DIAGNOSIS — I110 Hypertensive heart disease with heart failure: Secondary | ICD-10-CM | POA: Diagnosis not present

## 2020-08-06 DIAGNOSIS — I25119 Atherosclerotic heart disease of native coronary artery with unspecified angina pectoris: Secondary | ICD-10-CM | POA: Diagnosis not present

## 2020-08-06 DIAGNOSIS — I350 Nonrheumatic aortic (valve) stenosis: Secondary | ICD-10-CM | POA: Diagnosis not present

## 2020-08-06 DIAGNOSIS — E785 Hyperlipidemia, unspecified: Secondary | ICD-10-CM | POA: Diagnosis not present

## 2020-08-06 DIAGNOSIS — I5032 Chronic diastolic (congestive) heart failure: Secondary | ICD-10-CM | POA: Diagnosis not present

## 2020-08-10 DIAGNOSIS — I350 Nonrheumatic aortic (valve) stenosis: Secondary | ICD-10-CM | POA: Diagnosis not present

## 2020-08-10 DIAGNOSIS — I739 Peripheral vascular disease, unspecified: Secondary | ICD-10-CM | POA: Diagnosis not present

## 2020-08-10 DIAGNOSIS — E785 Hyperlipidemia, unspecified: Secondary | ICD-10-CM | POA: Diagnosis not present

## 2020-08-10 DIAGNOSIS — I25119 Atherosclerotic heart disease of native coronary artery with unspecified angina pectoris: Secondary | ICD-10-CM | POA: Diagnosis not present

## 2020-08-10 DIAGNOSIS — I5032 Chronic diastolic (congestive) heart failure: Secondary | ICD-10-CM | POA: Diagnosis not present

## 2020-08-10 DIAGNOSIS — I110 Hypertensive heart disease with heart failure: Secondary | ICD-10-CM | POA: Diagnosis not present

## 2020-08-13 DIAGNOSIS — I739 Peripheral vascular disease, unspecified: Secondary | ICD-10-CM | POA: Diagnosis not present

## 2020-08-13 DIAGNOSIS — I350 Nonrheumatic aortic (valve) stenosis: Secondary | ICD-10-CM | POA: Diagnosis not present

## 2020-08-13 DIAGNOSIS — I25119 Atherosclerotic heart disease of native coronary artery with unspecified angina pectoris: Secondary | ICD-10-CM | POA: Diagnosis not present

## 2020-08-13 DIAGNOSIS — E785 Hyperlipidemia, unspecified: Secondary | ICD-10-CM | POA: Diagnosis not present

## 2020-08-13 DIAGNOSIS — I5032 Chronic diastolic (congestive) heart failure: Secondary | ICD-10-CM | POA: Diagnosis not present

## 2020-08-13 DIAGNOSIS — I110 Hypertensive heart disease with heart failure: Secondary | ICD-10-CM | POA: Diagnosis not present

## 2020-08-14 ENCOUNTER — Telehealth: Payer: Self-pay | Admitting: Pharmacist

## 2020-08-14 DIAGNOSIS — I350 Nonrheumatic aortic (valve) stenosis: Secondary | ICD-10-CM | POA: Diagnosis not present

## 2020-08-14 DIAGNOSIS — I5032 Chronic diastolic (congestive) heart failure: Secondary | ICD-10-CM | POA: Diagnosis not present

## 2020-08-14 DIAGNOSIS — I110 Hypertensive heart disease with heart failure: Secondary | ICD-10-CM | POA: Diagnosis not present

## 2020-08-14 DIAGNOSIS — I25119 Atherosclerotic heart disease of native coronary artery with unspecified angina pectoris: Secondary | ICD-10-CM | POA: Diagnosis not present

## 2020-08-14 DIAGNOSIS — E785 Hyperlipidemia, unspecified: Secondary | ICD-10-CM | POA: Diagnosis not present

## 2020-08-14 DIAGNOSIS — I739 Peripheral vascular disease, unspecified: Secondary | ICD-10-CM | POA: Diagnosis not present

## 2020-08-14 NOTE — Progress Notes (Signed)
    Chronic Care Management Pharmacy Assistant   Name: Brittney Gates  MRN: [id number] DOB: [birth year]   Reason for Encounter: Disease State - General Adherence   Recent office visits:  None noted.  Recent consult visits:  06/20/20 [facility] (Cardiology) - Coronary artery disease. EKG done. Decrease Isosorbide 30 mg to 15 mg.  Hospital visits:  None in previous 6 months  Medications: Outpatient Encounter Medications as of 08/14/2020  Medication Sig  . acetaminophen (TYLENOL) 325 MG tablet Take 325 mg by mouth every 6 (six) hours as needed for mild pain, moderate pain or headache.  Marland Kitchen aspirin EC 81 MG tablet Take 81 mg by mouth daily.  . Cholecalciferol 2000 UNITS TABS Take 2,000 Units by mouth daily.  Marland Kitchen COVID-19 mRNA vaccine, Pfizer, 30 MCG/0.3ML injection TO BE ADMINISTERED BY PHARMACIST  . furosemide (LASIX) 40 MG tablet Take 2 tablets (80 mg total) by mouth in the morning and 1 tablet (40 mg total) by mouth in the evening.  Marland Kitchen HYDROcodone-acetaminophen (NORCO/VICODIN) 5-325 MG tablet Take 1 tablet by mouth every 4 (four) hours as needed for moderate pain.  . isosorbide mononitrate (IMDUR) 30 MG 24 hr tablet Take 15 mg by mouth daily.  Marland Kitchen KLOR-CON SPRINKLE 10 MEQ CR capsule Take 1 capsule (10 mEq total) by mouth daily.  Marland Kitchen levothyroxine (SYNTHROID) 100 MCG tablet TAKE ONE TABLET BY MOUTH EVERY MORNING BEFORE BREAKFAST  . Multiple Vitamin (MULTIVITAMIN WITH MINERALS) TABS tablet Take 1 tablet by mouth daily.  . nitroGLYCERIN (NITROSTAT) 0.4 MG SL tablet Place 1 tablet (0.4 mg total) under the tongue every 5 (five) minutes as needed for chest pain.  Brittney Gates 3-6-9 Fatty Acids (TRIPLE OMEGA-3-6-9 PO) Take 1 capsule by mouth daily.  . pregabalin (LYRICA) 50 MG capsule Take 1 capsule (50 mg total) by mouth 2 (two) times daily.  . sennosides-docusate sodium (SENOKOT-S) 8.6-50 MG tablet Take 2 tablets by mouth daily as needed for constipation.   Marland Kitchen zolpidem (AMBIEN) 10 MG tablet TAKE ONE TABLET BY  MOUTH EVERY NIGHT AT BEDTIME AS NEEDED FOR SLEEP   No facility-administered encounter medications on file as of 08/14/2020.    Have you had any problems recently with your health? Patient son states she is on Hospice care and her health is steady declining, she is currently bedridden and in pain all the time.   Have you had any problems with your pharmacy? Patient son states no problems at this time.   What issues or side effects are you having with your medications? Patient son states not at this time.   What would you like me to pass along to [facility] for them to help you with?  Patient son states she is in pain all the time and worries if the pain medicine is working.  What can we do to take care of you better? Patient son states he just wants all her providers to continue to see her that manages any of her medications, she is no longer able to come in the office for visits and will have to do virtual visits from now on. Patient son also states Hospice gave her 6 months to live.   Star Rating Drugs: None noted.  Brittney Gates, [place] Clinical Pharmacists Assistant [phone]  Time Spent: 31

## 2020-08-17 DIAGNOSIS — E785 Hyperlipidemia, unspecified: Secondary | ICD-10-CM | POA: Diagnosis not present

## 2020-08-17 DIAGNOSIS — I739 Peripheral vascular disease, unspecified: Secondary | ICD-10-CM | POA: Diagnosis not present

## 2020-08-17 DIAGNOSIS — I350 Nonrheumatic aortic (valve) stenosis: Secondary | ICD-10-CM | POA: Diagnosis not present

## 2020-08-17 DIAGNOSIS — I110 Hypertensive heart disease with heart failure: Secondary | ICD-10-CM | POA: Diagnosis not present

## 2020-08-17 DIAGNOSIS — I5032 Chronic diastolic (congestive) heart failure: Secondary | ICD-10-CM | POA: Diagnosis not present

## 2020-08-17 DIAGNOSIS — I25119 Atherosclerotic heart disease of native coronary artery with unspecified angina pectoris: Secondary | ICD-10-CM | POA: Diagnosis not present

## 2020-08-20 DIAGNOSIS — I739 Peripheral vascular disease, unspecified: Secondary | ICD-10-CM | POA: Diagnosis not present

## 2020-08-20 DIAGNOSIS — I350 Nonrheumatic aortic (valve) stenosis: Secondary | ICD-10-CM | POA: Diagnosis not present

## 2020-08-20 DIAGNOSIS — I5032 Chronic diastolic (congestive) heart failure: Secondary | ICD-10-CM | POA: Diagnosis not present

## 2020-08-20 DIAGNOSIS — I110 Hypertensive heart disease with heart failure: Secondary | ICD-10-CM | POA: Diagnosis not present

## 2020-08-20 DIAGNOSIS — E785 Hyperlipidemia, unspecified: Secondary | ICD-10-CM | POA: Diagnosis not present

## 2020-08-20 DIAGNOSIS — I25119 Atherosclerotic heart disease of native coronary artery with unspecified angina pectoris: Secondary | ICD-10-CM | POA: Diagnosis not present

## 2020-08-21 DIAGNOSIS — I25119 Atherosclerotic heart disease of native coronary artery with unspecified angina pectoris: Secondary | ICD-10-CM | POA: Diagnosis not present

## 2020-08-21 DIAGNOSIS — I739 Peripheral vascular disease, unspecified: Secondary | ICD-10-CM | POA: Diagnosis not present

## 2020-08-21 DIAGNOSIS — I110 Hypertensive heart disease with heart failure: Secondary | ICD-10-CM | POA: Diagnosis not present

## 2020-08-21 DIAGNOSIS — I5032 Chronic diastolic (congestive) heart failure: Secondary | ICD-10-CM | POA: Diagnosis not present

## 2020-08-21 DIAGNOSIS — I350 Nonrheumatic aortic (valve) stenosis: Secondary | ICD-10-CM | POA: Diagnosis not present

## 2020-08-21 DIAGNOSIS — E785 Hyperlipidemia, unspecified: Secondary | ICD-10-CM | POA: Diagnosis not present

## 2020-08-22 DIAGNOSIS — I739 Peripheral vascular disease, unspecified: Secondary | ICD-10-CM | POA: Diagnosis not present

## 2020-08-22 DIAGNOSIS — E785 Hyperlipidemia, unspecified: Secondary | ICD-10-CM | POA: Diagnosis not present

## 2020-08-22 DIAGNOSIS — I110 Hypertensive heart disease with heart failure: Secondary | ICD-10-CM | POA: Diagnosis not present

## 2020-08-22 DIAGNOSIS — I5032 Chronic diastolic (congestive) heart failure: Secondary | ICD-10-CM | POA: Diagnosis not present

## 2020-08-22 DIAGNOSIS — I25119 Atherosclerotic heart disease of native coronary artery with unspecified angina pectoris: Secondary | ICD-10-CM | POA: Diagnosis not present

## 2020-08-22 DIAGNOSIS — I350 Nonrheumatic aortic (valve) stenosis: Secondary | ICD-10-CM | POA: Diagnosis not present

## 2020-08-24 DIAGNOSIS — I110 Hypertensive heart disease with heart failure: Secondary | ICD-10-CM | POA: Diagnosis not present

## 2020-08-24 DIAGNOSIS — I25119 Atherosclerotic heart disease of native coronary artery with unspecified angina pectoris: Secondary | ICD-10-CM | POA: Diagnosis not present

## 2020-08-24 DIAGNOSIS — I739 Peripheral vascular disease, unspecified: Secondary | ICD-10-CM | POA: Diagnosis not present

## 2020-08-24 DIAGNOSIS — I350 Nonrheumatic aortic (valve) stenosis: Secondary | ICD-10-CM | POA: Diagnosis not present

## 2020-08-24 DIAGNOSIS — E785 Hyperlipidemia, unspecified: Secondary | ICD-10-CM | POA: Diagnosis not present

## 2020-08-24 DIAGNOSIS — I5032 Chronic diastolic (congestive) heart failure: Secondary | ICD-10-CM | POA: Diagnosis not present

## 2020-08-28 NOTE — Telephone Encounter (Signed)
Changed CCM time to non-billable due to patient's hospice status.

## 2020-08-29 DIAGNOSIS — E039 Hypothyroidism, unspecified: Secondary | ICD-10-CM | POA: Diagnosis not present

## 2020-08-29 DIAGNOSIS — I350 Nonrheumatic aortic (valve) stenosis: Secondary | ICD-10-CM | POA: Diagnosis not present

## 2020-08-29 DIAGNOSIS — I110 Hypertensive heart disease with heart failure: Secondary | ICD-10-CM | POA: Diagnosis not present

## 2020-08-29 DIAGNOSIS — I5032 Chronic diastolic (congestive) heart failure: Secondary | ICD-10-CM | POA: Diagnosis not present

## 2020-08-29 DIAGNOSIS — E785 Hyperlipidemia, unspecified: Secondary | ICD-10-CM | POA: Diagnosis not present

## 2020-08-29 DIAGNOSIS — I739 Peripheral vascular disease, unspecified: Secondary | ICD-10-CM | POA: Diagnosis not present

## 2020-08-29 DIAGNOSIS — I25119 Atherosclerotic heart disease of native coronary artery with unspecified angina pectoris: Secondary | ICD-10-CM | POA: Diagnosis not present

## 2020-08-29 DIAGNOSIS — G629 Polyneuropathy, unspecified: Secondary | ICD-10-CM | POA: Diagnosis not present

## 2020-08-31 DIAGNOSIS — I739 Peripheral vascular disease, unspecified: Secondary | ICD-10-CM | POA: Diagnosis not present

## 2020-08-31 DIAGNOSIS — I350 Nonrheumatic aortic (valve) stenosis: Secondary | ICD-10-CM | POA: Diagnosis not present

## 2020-08-31 DIAGNOSIS — I5032 Chronic diastolic (congestive) heart failure: Secondary | ICD-10-CM | POA: Diagnosis not present

## 2020-08-31 DIAGNOSIS — E785 Hyperlipidemia, unspecified: Secondary | ICD-10-CM | POA: Diagnosis not present

## 2020-08-31 DIAGNOSIS — I25119 Atherosclerotic heart disease of native coronary artery with unspecified angina pectoris: Secondary | ICD-10-CM | POA: Diagnosis not present

## 2020-08-31 DIAGNOSIS — I110 Hypertensive heart disease with heart failure: Secondary | ICD-10-CM | POA: Diagnosis not present

## 2020-09-05 DIAGNOSIS — I110 Hypertensive heart disease with heart failure: Secondary | ICD-10-CM | POA: Diagnosis not present

## 2020-09-05 DIAGNOSIS — I25119 Atherosclerotic heart disease of native coronary artery with unspecified angina pectoris: Secondary | ICD-10-CM | POA: Diagnosis not present

## 2020-09-05 DIAGNOSIS — E785 Hyperlipidemia, unspecified: Secondary | ICD-10-CM | POA: Diagnosis not present

## 2020-09-05 DIAGNOSIS — I739 Peripheral vascular disease, unspecified: Secondary | ICD-10-CM | POA: Diagnosis not present

## 2020-09-05 DIAGNOSIS — I5032 Chronic diastolic (congestive) heart failure: Secondary | ICD-10-CM | POA: Diagnosis not present

## 2020-09-05 DIAGNOSIS — I350 Nonrheumatic aortic (valve) stenosis: Secondary | ICD-10-CM | POA: Diagnosis not present

## 2020-09-06 DIAGNOSIS — I5032 Chronic diastolic (congestive) heart failure: Secondary | ICD-10-CM | POA: Diagnosis not present

## 2020-09-06 DIAGNOSIS — I350 Nonrheumatic aortic (valve) stenosis: Secondary | ICD-10-CM | POA: Diagnosis not present

## 2020-09-06 DIAGNOSIS — E785 Hyperlipidemia, unspecified: Secondary | ICD-10-CM | POA: Diagnosis not present

## 2020-09-06 DIAGNOSIS — I25119 Atherosclerotic heart disease of native coronary artery with unspecified angina pectoris: Secondary | ICD-10-CM | POA: Diagnosis not present

## 2020-09-06 DIAGNOSIS — I739 Peripheral vascular disease, unspecified: Secondary | ICD-10-CM | POA: Diagnosis not present

## 2020-09-06 DIAGNOSIS — I110 Hypertensive heart disease with heart failure: Secondary | ICD-10-CM | POA: Diagnosis not present

## 2020-09-07 ENCOUNTER — Telehealth: Payer: Self-pay | Admitting: Internal Medicine

## 2020-09-07 DIAGNOSIS — I350 Nonrheumatic aortic (valve) stenosis: Secondary | ICD-10-CM | POA: Diagnosis not present

## 2020-09-07 DIAGNOSIS — I739 Peripheral vascular disease, unspecified: Secondary | ICD-10-CM | POA: Diagnosis not present

## 2020-09-07 DIAGNOSIS — I5021 Acute systolic (congestive) heart failure: Secondary | ICD-10-CM

## 2020-09-07 DIAGNOSIS — M5417 Radiculopathy, lumbosacral region: Secondary | ICD-10-CM

## 2020-09-07 DIAGNOSIS — E785 Hyperlipidemia, unspecified: Secondary | ICD-10-CM | POA: Diagnosis not present

## 2020-09-07 DIAGNOSIS — I110 Hypertensive heart disease with heart failure: Secondary | ICD-10-CM | POA: Diagnosis not present

## 2020-09-07 DIAGNOSIS — I25119 Atherosclerotic heart disease of native coronary artery with unspecified angina pectoris: Secondary | ICD-10-CM | POA: Diagnosis not present

## 2020-09-07 DIAGNOSIS — I5032 Chronic diastolic (congestive) heart failure: Secondary | ICD-10-CM | POA: Diagnosis not present

## 2020-09-07 NOTE — Telephone Encounter (Signed)
[place] Name: [place] Agency Name: [facility] Phone #: [phone]  Service Requested: Hospice (examples: OT/PT/Skilled Nursing/Social Work/Speech Therapy/Wound Care)  Frequency of Visits: continued   Stated patient is very close to end of life.

## 2020-09-09 DIAGNOSIS — I350 Nonrheumatic aortic (valve) stenosis: Secondary | ICD-10-CM | POA: Diagnosis not present

## 2020-09-09 DIAGNOSIS — I5032 Chronic diastolic (congestive) heart failure: Secondary | ICD-10-CM | POA: Diagnosis not present

## 2020-09-09 DIAGNOSIS — I25119 Atherosclerotic heart disease of native coronary artery with unspecified angina pectoris: Secondary | ICD-10-CM | POA: Diagnosis not present

## 2020-09-09 DIAGNOSIS — I739 Peripheral vascular disease, unspecified: Secondary | ICD-10-CM | POA: Diagnosis not present

## 2020-09-09 DIAGNOSIS — I110 Hypertensive heart disease with heart failure: Secondary | ICD-10-CM | POA: Diagnosis not present

## 2020-09-09 DIAGNOSIS — E785 Hyperlipidemia, unspecified: Secondary | ICD-10-CM | POA: Diagnosis not present

## 2020-09-10 DIAGNOSIS — I110 Hypertensive heart disease with heart failure: Secondary | ICD-10-CM | POA: Diagnosis not present

## 2020-09-10 DIAGNOSIS — I350 Nonrheumatic aortic (valve) stenosis: Secondary | ICD-10-CM | POA: Diagnosis not present

## 2020-09-10 DIAGNOSIS — I739 Peripheral vascular disease, unspecified: Secondary | ICD-10-CM | POA: Diagnosis not present

## 2020-09-10 DIAGNOSIS — E785 Hyperlipidemia, unspecified: Secondary | ICD-10-CM | POA: Diagnosis not present

## 2020-09-10 DIAGNOSIS — I5032 Chronic diastolic (congestive) heart failure: Secondary | ICD-10-CM | POA: Diagnosis not present

## 2020-09-10 DIAGNOSIS — I25119 Atherosclerotic heart disease of native coronary artery with unspecified angina pectoris: Secondary | ICD-10-CM | POA: Diagnosis not present

## 2020-09-10 NOTE — Telephone Encounter (Signed)
Referral for hospice placed please fax to hospice.

## 2020-09-10 NOTE — Telephone Encounter (Signed)
Referral verbally completed over the phone.  [place] to be the attending.

## 2020-09-11 DIAGNOSIS — E785 Hyperlipidemia, unspecified: Secondary | ICD-10-CM | POA: Diagnosis not present

## 2020-09-11 DIAGNOSIS — I350 Nonrheumatic aortic (valve) stenosis: Secondary | ICD-10-CM | POA: Diagnosis not present

## 2020-09-11 DIAGNOSIS — I739 Peripheral vascular disease, unspecified: Secondary | ICD-10-CM | POA: Diagnosis not present

## 2020-09-11 DIAGNOSIS — I110 Hypertensive heart disease with heart failure: Secondary | ICD-10-CM | POA: Diagnosis not present

## 2020-09-11 DIAGNOSIS — I5032 Chronic diastolic (congestive) heart failure: Secondary | ICD-10-CM | POA: Diagnosis not present

## 2020-09-11 DIAGNOSIS — I25119 Atherosclerotic heart disease of native coronary artery with unspecified angina pectoris: Secondary | ICD-10-CM | POA: Diagnosis not present

## 2020-09-12 DIAGNOSIS — I25119 Atherosclerotic heart disease of native coronary artery with unspecified angina pectoris: Secondary | ICD-10-CM | POA: Diagnosis not present

## 2020-09-12 DIAGNOSIS — I110 Hypertensive heart disease with heart failure: Secondary | ICD-10-CM | POA: Diagnosis not present

## 2020-09-12 DIAGNOSIS — I739 Peripheral vascular disease, unspecified: Secondary | ICD-10-CM | POA: Diagnosis not present

## 2020-09-12 DIAGNOSIS — E785 Hyperlipidemia, unspecified: Secondary | ICD-10-CM | POA: Diagnosis not present

## 2020-09-12 DIAGNOSIS — I5032 Chronic diastolic (congestive) heart failure: Secondary | ICD-10-CM | POA: Diagnosis not present

## 2020-09-12 DIAGNOSIS — I350 Nonrheumatic aortic (valve) stenosis: Secondary | ICD-10-CM | POA: Diagnosis not present

## 2020-09-12 NOTE — Telephone Encounter (Signed)
FYI: Patient is at home, comfortable, probably only another couple of days of life

## 2020-09-13 DIAGNOSIS — E785 Hyperlipidemia, unspecified: Secondary | ICD-10-CM | POA: Diagnosis not present

## 2020-09-13 DIAGNOSIS — I5032 Chronic diastolic (congestive) heart failure: Secondary | ICD-10-CM | POA: Diagnosis not present

## 2020-09-13 DIAGNOSIS — I25119 Atherosclerotic heart disease of native coronary artery with unspecified angina pectoris: Secondary | ICD-10-CM | POA: Diagnosis not present

## 2020-09-13 DIAGNOSIS — I110 Hypertensive heart disease with heart failure: Secondary | ICD-10-CM | POA: Diagnosis not present

## 2020-09-13 DIAGNOSIS — I739 Peripheral vascular disease, unspecified: Secondary | ICD-10-CM | POA: Diagnosis not present

## 2020-09-13 DIAGNOSIS — I350 Nonrheumatic aortic (valve) stenosis: Secondary | ICD-10-CM | POA: Diagnosis not present

## 2020-09-14 DIAGNOSIS — I110 Hypertensive heart disease with heart failure: Secondary | ICD-10-CM | POA: Diagnosis not present

## 2020-09-14 DIAGNOSIS — E785 Hyperlipidemia, unspecified: Secondary | ICD-10-CM | POA: Diagnosis not present

## 2020-09-14 DIAGNOSIS — I5032 Chronic diastolic (congestive) heart failure: Secondary | ICD-10-CM | POA: Diagnosis not present

## 2020-09-14 DIAGNOSIS — I25119 Atherosclerotic heart disease of native coronary artery with unspecified angina pectoris: Secondary | ICD-10-CM | POA: Diagnosis not present

## 2020-09-14 DIAGNOSIS — I739 Peripheral vascular disease, unspecified: Secondary | ICD-10-CM | POA: Diagnosis not present

## 2020-09-14 DIAGNOSIS — I350 Nonrheumatic aortic (valve) stenosis: Secondary | ICD-10-CM | POA: Diagnosis not present

## 2020-09-15 DIAGNOSIS — I5032 Chronic diastolic (congestive) heart failure: Secondary | ICD-10-CM | POA: Diagnosis not present

## 2020-09-15 DIAGNOSIS — E785 Hyperlipidemia, unspecified: Secondary | ICD-10-CM | POA: Diagnosis not present

## 2020-09-15 DIAGNOSIS — I25119 Atherosclerotic heart disease of native coronary artery with unspecified angina pectoris: Secondary | ICD-10-CM | POA: Diagnosis not present

## 2020-09-15 DIAGNOSIS — I739 Peripheral vascular disease, unspecified: Secondary | ICD-10-CM | POA: Diagnosis not present

## 2020-09-15 DIAGNOSIS — I350 Nonrheumatic aortic (valve) stenosis: Secondary | ICD-10-CM | POA: Diagnosis not present

## 2020-09-15 DIAGNOSIS — I110 Hypertensive heart disease with heart failure: Secondary | ICD-10-CM | POA: Diagnosis not present

## 2020-09-16 DIAGNOSIS — E785 Hyperlipidemia, unspecified: Secondary | ICD-10-CM | POA: Diagnosis not present

## 2020-09-16 DIAGNOSIS — I25119 Atherosclerotic heart disease of native coronary artery with unspecified angina pectoris: Secondary | ICD-10-CM | POA: Diagnosis not present

## 2020-09-16 DIAGNOSIS — I350 Nonrheumatic aortic (valve) stenosis: Secondary | ICD-10-CM | POA: Diagnosis not present

## 2020-09-16 DIAGNOSIS — I739 Peripheral vascular disease, unspecified: Secondary | ICD-10-CM | POA: Diagnosis not present

## 2020-09-16 DIAGNOSIS — I110 Hypertensive heart disease with heart failure: Secondary | ICD-10-CM | POA: Diagnosis not present

## 2020-09-16 DIAGNOSIS — I5032 Chronic diastolic (congestive) heart failure: Secondary | ICD-10-CM | POA: Diagnosis not present

## 2020-09-17 ENCOUNTER — Telehealth: Payer: Self-pay | Admitting: *Deleted

## 2020-09-17 DIAGNOSIS — I739 Peripheral vascular disease, unspecified: Secondary | ICD-10-CM | POA: Diagnosis not present

## 2020-09-17 DIAGNOSIS — I350 Nonrheumatic aortic (valve) stenosis: Secondary | ICD-10-CM | POA: Diagnosis not present

## 2020-09-17 DIAGNOSIS — I5032 Chronic diastolic (congestive) heart failure: Secondary | ICD-10-CM | POA: Diagnosis not present

## 2020-09-17 DIAGNOSIS — I25119 Atherosclerotic heart disease of native coronary artery with unspecified angina pectoris: Secondary | ICD-10-CM | POA: Diagnosis not present

## 2020-09-17 DIAGNOSIS — E785 Hyperlipidemia, unspecified: Secondary | ICD-10-CM | POA: Diagnosis not present

## 2020-09-17 DIAGNOSIS — I110 Hypertensive heart disease with heart failure: Secondary | ICD-10-CM | POA: Diagnosis not present

## 2020-09-17 NOTE — Telephone Encounter (Signed)
On 09/14/2020, faxed signed order to [employer]. Per Mickel Baas when I called for the fax number, send it to [phone] the office in [place] because it will be sent faster. Confirmation rec'd Ok.

## 2020-09-18 ENCOUNTER — Telehealth: Payer: Self-pay | Admitting: Internal Medicine

## 2020-09-18 NOTE — Telephone Encounter (Signed)
[facility] called to let Dr. Sharlet Salina know that Brittney Gates has died She was with her family as she wanted to be, and died peacefully.

## 2020-09-19 ENCOUNTER — Telehealth: Payer: Self-pay | Admitting: Internal Medicine

## 2020-09-19 NOTE — Telephone Encounter (Signed)
Fyi.

## 2020-09-19 NOTE — Telephone Encounter (Signed)
FYI..  Caller states they have sent over the death certificate for the patient.

## 2020-09-19 NOTE — Telephone Encounter (Signed)
Noted  

## 2020-09-28 ENCOUNTER — Other Ambulatory Visit: Payer: Self-pay | Admitting: Internal Medicine

## 2020-09-28 DIAGNOSIS — E039 Hypothyroidism, unspecified: Secondary | ICD-10-CM

## 2020-09-28 DEATH — deceased
